# Patient Record
Sex: Female | Born: 1935
Health system: Southern US, Community
[De-identification: ages and names within clinical notes are randomized; demographics above are authoritative.]

## PROBLEM LIST (undated history)

## (undated) DIAGNOSIS — E785 Hyperlipidemia, unspecified: Secondary | ICD-10-CM

## (undated) DIAGNOSIS — F0280 Dementia in other diseases classified elsewhere without behavioral disturbance: Secondary | ICD-10-CM

## (undated) DIAGNOSIS — I2699 Other pulmonary embolism without acute cor pulmonale: Secondary | ICD-10-CM

## (undated) DIAGNOSIS — I10 Essential (primary) hypertension: Secondary | ICD-10-CM

## (undated) DIAGNOSIS — C801 Malignant (primary) neoplasm, unspecified: Secondary | ICD-10-CM

## (undated) DIAGNOSIS — G309 Alzheimer's disease, unspecified: Secondary | ICD-10-CM

## (undated) HISTORY — PX: REPLACEMENT TOTAL KNEE: SUR1224

## (undated) HISTORY — PX: ABDOMINAL HYSTERECTOMY: SHX81

## (undated) HISTORY — DX: Malignant (primary) neoplasm, unspecified: C80.1

## (undated) HISTORY — DX: Essential (primary) hypertension: I10

## (undated) HISTORY — DX: Hyperlipidemia, unspecified: E78.5

## (undated) HISTORY — DX: Dementia in other diseases classified elsewhere, unspecified severity, without behavioral disturbance, psychotic disturbance, mood disturbance, and anxiety: F02.80

## (undated) HISTORY — DX: Other pulmonary embolism without acute cor pulmonale: I26.99

## (undated) HISTORY — DX: Dementia in other diseases classified elsewhere, unspecified severity, without behavioral disturbance, psychotic disturbance, mood disturbance, and anxiety: G30.9

## (undated) HISTORY — PX: BUNIONECTOMY: SHX129

---

## 1998-07-13 ENCOUNTER — Other Ambulatory Visit: Admission: RE | Admit: 1998-07-13 | Discharge: 1998-07-13 | Payer: Self-pay | Admitting: Obstetrics and Gynecology

## 1998-08-09 ENCOUNTER — Ambulatory Visit (HOSPITAL_COMMUNITY): Admission: RE | Admit: 1998-08-09 | Discharge: 1998-08-09 | Payer: Self-pay | Admitting: Internal Medicine

## 1998-08-09 ENCOUNTER — Encounter: Payer: Self-pay | Admitting: Internal Medicine

## 1998-08-17 ENCOUNTER — Ambulatory Visit (HOSPITAL_COMMUNITY): Admission: RE | Admit: 1998-08-17 | Discharge: 1998-08-17 | Payer: Self-pay | Admitting: Obstetrics and Gynecology

## 1998-08-17 ENCOUNTER — Encounter: Payer: Self-pay | Admitting: Obstetrics and Gynecology

## 1999-08-02 ENCOUNTER — Other Ambulatory Visit: Admission: RE | Admit: 1999-08-02 | Discharge: 1999-08-02 | Payer: Self-pay | Admitting: Family Medicine

## 1999-08-14 ENCOUNTER — Encounter: Payer: Self-pay | Admitting: Family Medicine

## 1999-08-14 ENCOUNTER — Ambulatory Visit (HOSPITAL_COMMUNITY): Admission: RE | Admit: 1999-08-14 | Discharge: 1999-08-14 | Payer: Self-pay | Admitting: Family Medicine

## 1999-09-21 ENCOUNTER — Inpatient Hospital Stay (HOSPITAL_COMMUNITY): Admission: EM | Admit: 1999-09-21 | Discharge: 1999-09-28 | Payer: Self-pay | Admitting: Emergency Medicine

## 1999-09-21 ENCOUNTER — Encounter: Payer: Self-pay | Admitting: Family Medicine

## 2000-03-22 ENCOUNTER — Encounter: Payer: Self-pay | Admitting: Orthopedic Surgery

## 2000-03-26 ENCOUNTER — Ambulatory Visit (HOSPITAL_COMMUNITY): Admission: RE | Admit: 2000-03-26 | Discharge: 2000-03-26 | Payer: Self-pay | Admitting: Orthopedic Surgery

## 2000-08-15 ENCOUNTER — Encounter: Payer: Self-pay | Admitting: Family Medicine

## 2000-08-15 ENCOUNTER — Ambulatory Visit (HOSPITAL_COMMUNITY): Admission: RE | Admit: 2000-08-15 | Discharge: 2000-08-15 | Payer: Self-pay | Admitting: Family Medicine

## 2000-08-22 ENCOUNTER — Other Ambulatory Visit: Admission: RE | Admit: 2000-08-22 | Discharge: 2000-08-22 | Payer: Self-pay | Admitting: Family Medicine

## 2000-11-15 ENCOUNTER — Encounter: Admission: RE | Admit: 2000-11-15 | Discharge: 2000-12-19 | Payer: Self-pay | Admitting: Orthopedic Surgery

## 2001-05-14 ENCOUNTER — Encounter: Payer: Self-pay | Admitting: Neurosurgery

## 2001-05-14 ENCOUNTER — Ambulatory Visit (HOSPITAL_COMMUNITY): Admission: RE | Admit: 2001-05-14 | Discharge: 2001-05-14 | Payer: Self-pay | Admitting: Neurosurgery

## 2001-08-18 ENCOUNTER — Ambulatory Visit (HOSPITAL_COMMUNITY): Admission: RE | Admit: 2001-08-18 | Discharge: 2001-08-18 | Payer: Self-pay | Admitting: Family Medicine

## 2001-08-18 ENCOUNTER — Encounter: Admission: RE | Admit: 2001-08-18 | Discharge: 2001-08-18 | Payer: Self-pay | Admitting: Family Medicine

## 2001-08-18 ENCOUNTER — Encounter: Payer: Self-pay | Admitting: Family Medicine

## 2001-08-20 ENCOUNTER — Encounter: Payer: Self-pay | Admitting: Neurosurgery

## 2001-08-20 ENCOUNTER — Encounter: Admission: RE | Admit: 2001-08-20 | Discharge: 2001-08-20 | Payer: Self-pay | Admitting: Neurosurgery

## 2001-08-26 ENCOUNTER — Other Ambulatory Visit: Admission: RE | Admit: 2001-08-26 | Discharge: 2001-08-26 | Payer: Self-pay | Admitting: Family Medicine

## 2001-09-03 ENCOUNTER — Encounter: Admission: RE | Admit: 2001-09-03 | Discharge: 2001-09-03 | Payer: Self-pay | Admitting: Neurosurgery

## 2001-09-03 ENCOUNTER — Encounter: Payer: Self-pay | Admitting: Neurosurgery

## 2001-11-24 ENCOUNTER — Encounter: Payer: Self-pay | Admitting: Neurosurgery

## 2001-11-24 ENCOUNTER — Encounter: Admission: RE | Admit: 2001-11-24 | Discharge: 2001-11-24 | Payer: Self-pay | Admitting: Neurosurgery

## 2002-01-30 ENCOUNTER — Encounter: Payer: Self-pay | Admitting: Neurosurgery

## 2002-02-04 ENCOUNTER — Encounter: Payer: Self-pay | Admitting: Neurosurgery

## 2002-02-04 ENCOUNTER — Inpatient Hospital Stay (HOSPITAL_COMMUNITY): Admission: RE | Admit: 2002-02-04 | Discharge: 2002-02-07 | Payer: Self-pay | Admitting: Neurosurgery

## 2002-04-01 ENCOUNTER — Encounter: Payer: Self-pay | Admitting: Neurosurgery

## 2002-04-01 ENCOUNTER — Encounter: Admission: RE | Admit: 2002-04-01 | Discharge: 2002-04-01 | Payer: Self-pay | Admitting: Neurosurgery

## 2002-06-29 ENCOUNTER — Encounter: Admission: RE | Admit: 2002-06-29 | Discharge: 2002-06-29 | Payer: Self-pay | Admitting: Neurosurgery

## 2002-06-29 ENCOUNTER — Encounter: Payer: Self-pay | Admitting: Neurosurgery

## 2002-08-20 ENCOUNTER — Ambulatory Visit (HOSPITAL_COMMUNITY): Admission: RE | Admit: 2002-08-20 | Discharge: 2002-08-20 | Payer: Self-pay | Admitting: Family Medicine

## 2002-08-20 ENCOUNTER — Encounter: Payer: Self-pay | Admitting: Family Medicine

## 2002-08-25 ENCOUNTER — Other Ambulatory Visit: Admission: RE | Admit: 2002-08-25 | Discharge: 2002-08-25 | Payer: Self-pay | Admitting: Family Medicine

## 2002-08-31 ENCOUNTER — Encounter: Admission: RE | Admit: 2002-08-31 | Discharge: 2002-08-31 | Payer: Self-pay | Admitting: Orthopedic Surgery

## 2002-08-31 ENCOUNTER — Encounter: Payer: Self-pay | Admitting: Orthopedic Surgery

## 2002-09-17 DIAGNOSIS — I2699 Other pulmonary embolism without acute cor pulmonale: Secondary | ICD-10-CM

## 2002-09-17 HISTORY — DX: Other pulmonary embolism without acute cor pulmonale: I26.99

## 2003-01-12 ENCOUNTER — Encounter: Payer: Self-pay | Admitting: Orthopedic Surgery

## 2003-01-13 ENCOUNTER — Ambulatory Visit (HOSPITAL_COMMUNITY): Admission: RE | Admit: 2003-01-13 | Discharge: 2003-01-13 | Payer: Self-pay | Admitting: Gastroenterology

## 2003-01-18 ENCOUNTER — Inpatient Hospital Stay (HOSPITAL_COMMUNITY): Admission: RE | Admit: 2003-01-18 | Discharge: 2003-01-20 | Payer: Self-pay | Admitting: Orthopedic Surgery

## 2003-04-05 ENCOUNTER — Inpatient Hospital Stay (HOSPITAL_COMMUNITY): Admission: RE | Admit: 2003-04-05 | Discharge: 2003-04-08 | Payer: Self-pay | Admitting: Orthopedic Surgery

## 2003-08-25 ENCOUNTER — Ambulatory Visit (HOSPITAL_COMMUNITY): Admission: RE | Admit: 2003-08-25 | Discharge: 2003-08-25 | Payer: Self-pay | Admitting: Family Medicine

## 2003-08-31 ENCOUNTER — Other Ambulatory Visit: Admission: RE | Admit: 2003-08-31 | Discharge: 2003-08-31 | Payer: Self-pay | Admitting: Family Medicine

## 2004-02-18 ENCOUNTER — Encounter: Admission: RE | Admit: 2004-02-18 | Discharge: 2004-02-18 | Payer: Self-pay | Admitting: Family Medicine

## 2004-08-29 ENCOUNTER — Ambulatory Visit (HOSPITAL_COMMUNITY): Admission: RE | Admit: 2004-08-29 | Discharge: 2004-08-29 | Payer: Self-pay | Admitting: Family Medicine

## 2004-09-06 ENCOUNTER — Other Ambulatory Visit: Admission: RE | Admit: 2004-09-06 | Discharge: 2004-09-06 | Payer: Self-pay | Admitting: Family Medicine

## 2005-09-05 ENCOUNTER — Ambulatory Visit (HOSPITAL_COMMUNITY): Admission: RE | Admit: 2005-09-05 | Discharge: 2005-09-05 | Payer: Self-pay | Admitting: Family Medicine

## 2005-10-17 ENCOUNTER — Other Ambulatory Visit: Admission: RE | Admit: 2005-10-17 | Discharge: 2005-10-17 | Payer: Self-pay | Admitting: Family Medicine

## 2006-09-13 ENCOUNTER — Encounter: Admission: RE | Admit: 2006-09-13 | Discharge: 2006-09-13 | Payer: Self-pay | Admitting: Family Medicine

## 2006-10-08 ENCOUNTER — Ambulatory Visit (HOSPITAL_COMMUNITY): Admission: RE | Admit: 2006-10-08 | Discharge: 2006-10-08 | Payer: Self-pay | Admitting: Family Medicine

## 2006-10-17 ENCOUNTER — Encounter: Admission: RE | Admit: 2006-10-17 | Discharge: 2006-10-17 | Payer: Self-pay | Admitting: Family Medicine

## 2006-10-24 ENCOUNTER — Other Ambulatory Visit: Admission: RE | Admit: 2006-10-24 | Discharge: 2006-10-24 | Payer: Self-pay | Admitting: Family Medicine

## 2007-10-20 ENCOUNTER — Ambulatory Visit (HOSPITAL_COMMUNITY): Admission: RE | Admit: 2007-10-20 | Discharge: 2007-10-20 | Payer: Self-pay | Admitting: Family Medicine

## 2007-10-29 ENCOUNTER — Emergency Department (HOSPITAL_COMMUNITY): Admission: EM | Admit: 2007-10-29 | Discharge: 2007-10-29 | Payer: Self-pay | Admitting: Emergency Medicine

## 2007-11-12 ENCOUNTER — Other Ambulatory Visit: Admission: RE | Admit: 2007-11-12 | Discharge: 2007-11-12 | Payer: Self-pay | Admitting: Family Medicine

## 2008-07-15 ENCOUNTER — Encounter: Admission: RE | Admit: 2008-07-15 | Discharge: 2008-07-15 | Payer: Self-pay | Admitting: Family Medicine

## 2008-08-10 ENCOUNTER — Emergency Department (HOSPITAL_COMMUNITY): Admission: EM | Admit: 2008-08-10 | Discharge: 2008-08-10 | Payer: Self-pay | Admitting: Emergency Medicine

## 2008-10-26 ENCOUNTER — Ambulatory Visit (HOSPITAL_COMMUNITY): Admission: RE | Admit: 2008-10-26 | Discharge: 2008-10-26 | Payer: Self-pay | Admitting: Family Medicine

## 2009-01-04 ENCOUNTER — Other Ambulatory Visit: Admission: RE | Admit: 2009-01-04 | Discharge: 2009-01-04 | Payer: Self-pay | Admitting: Family Medicine

## 2009-02-09 IMAGING — CT CT CERVICAL SPINE W/O CM
3 of 4 series · 11 of 20 positions shown, 12 images · non-contrast
Comparison: No prior CT for comparison.  The patient had plain
films of the C-spine on 07/15/2008

CT HEAD

CLINICAL DATA: Fell - headache and neck pain

CT HEAD WITHOUT CONTRAST
CT CERVICAL SPINE WITHOUT CONTRAST
TECHNIQUE: Multidetector CT imaging of the head and cervical spine
was performed following the standard protocol without intravenous
contrast.  Multiplanar CT image reconstructions of the cervical
spine were also generated.

[Series 4: c_spine 2.0 b31s · axial · 0.33mm/px · z∈[-256,-166]mm · 4 of 76 slices shown]
[im 16/76  bone]
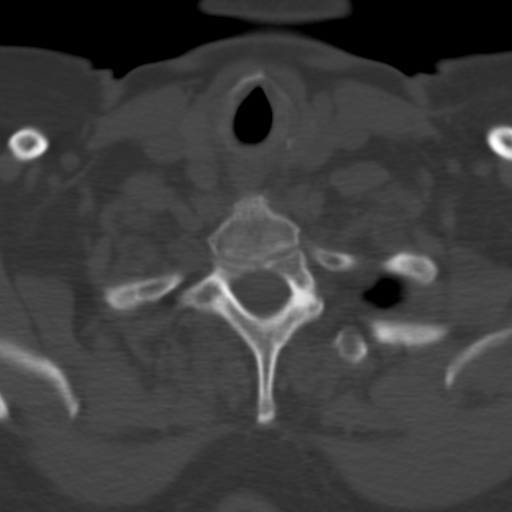
[im 31/76  bone]
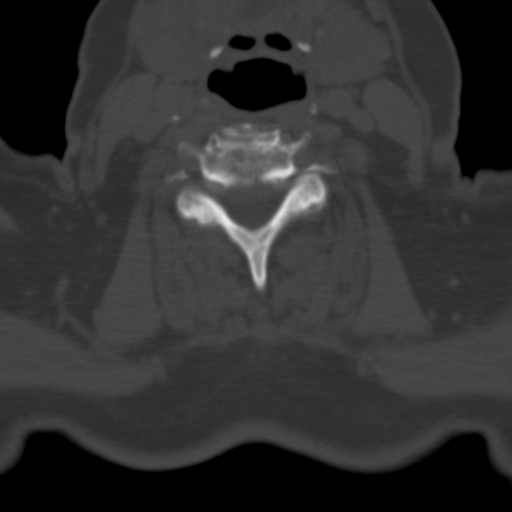
[im 46/76  bone]
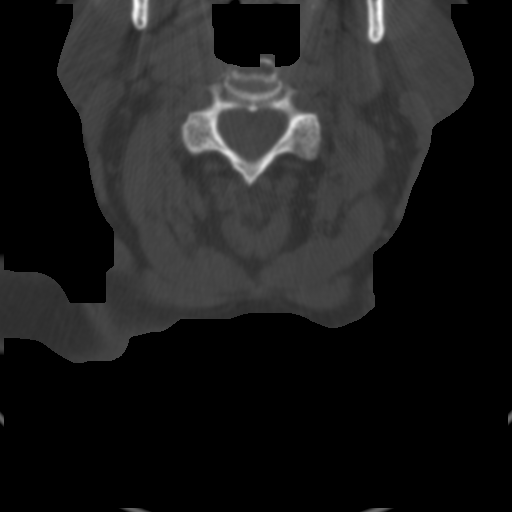
[im 61/76  bone]
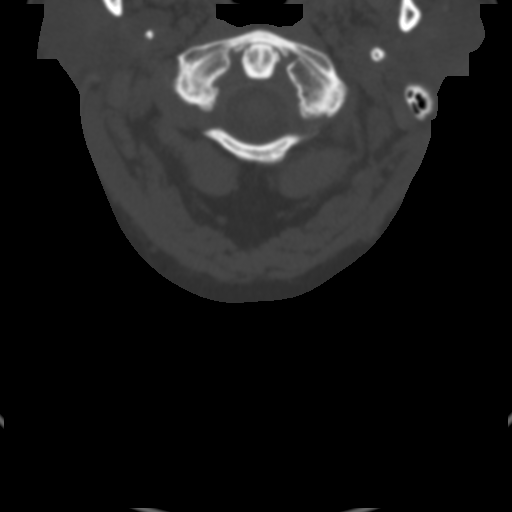

[Series 602: <mpr thick range> · axial · 0.33mm/px · z∈[-285,-201]mm · 4 of 78 slices shown, 5 images]
[im 16/78  soft-tissue]
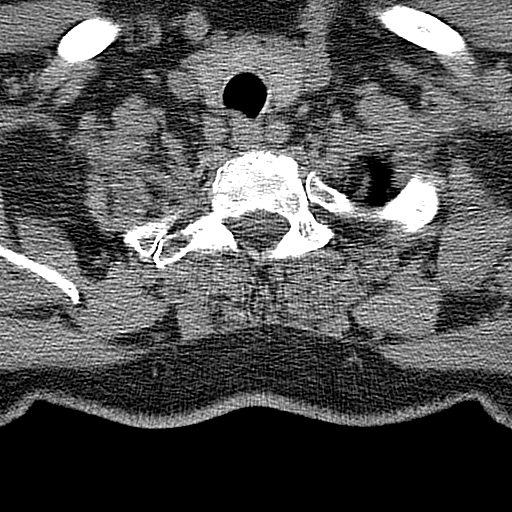
[im 16/78  bone]
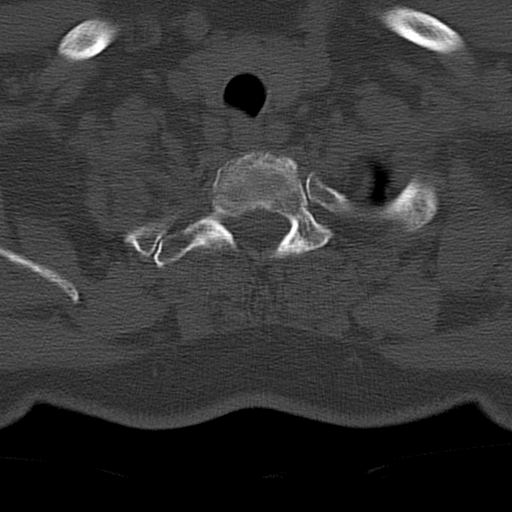
[im 31/78  bone]
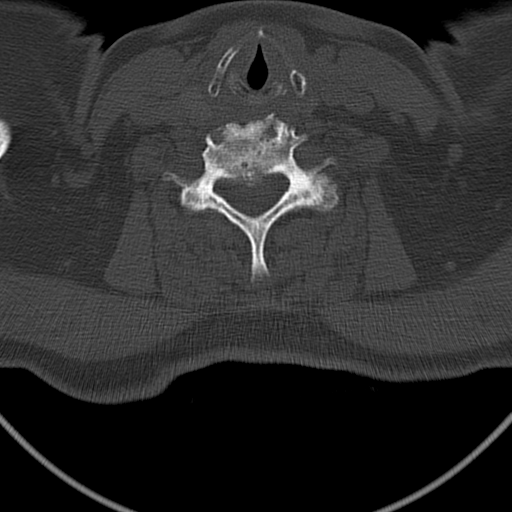
[im 47/78  bone]
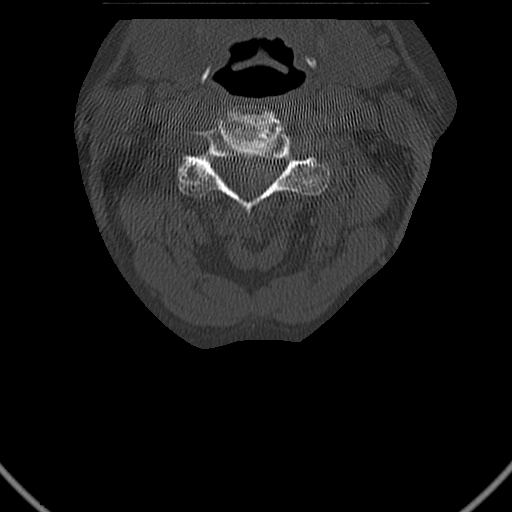
[im 62/78  bone]
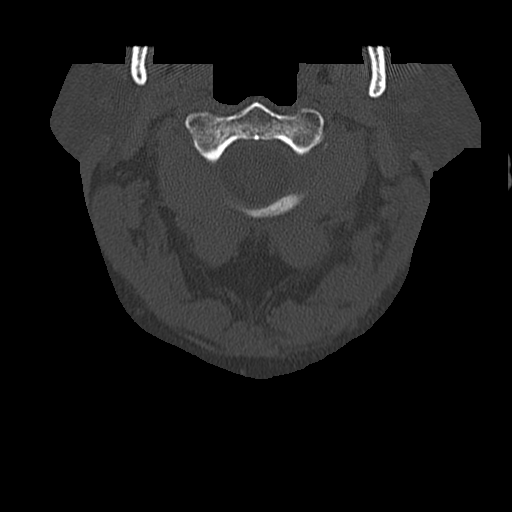

[Series 603: <mpr thick range(1)> · coronal · 0.33mm/px · 3 of 39 slices shown]
[im 8/39  bone]
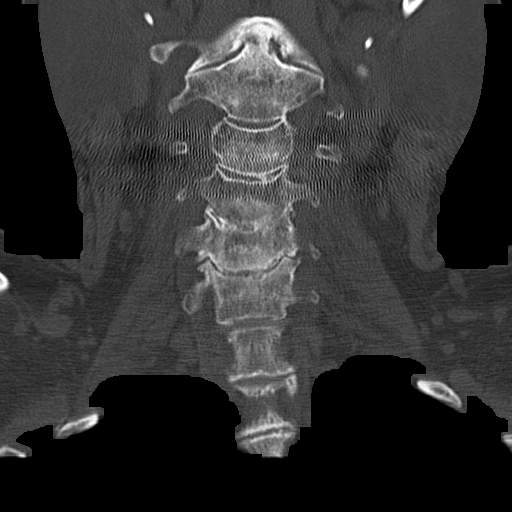
[im 16/39  bone]
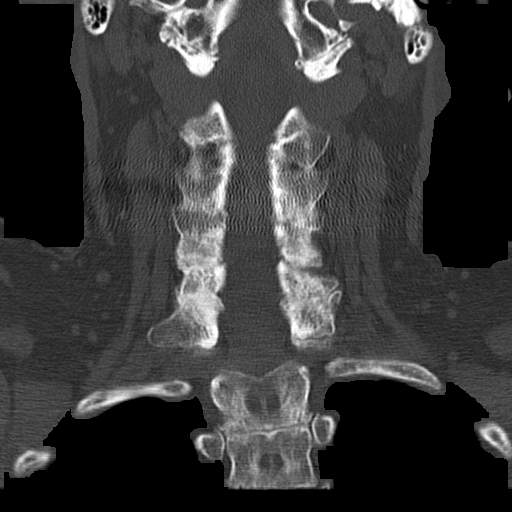
[im 23/39  bone]
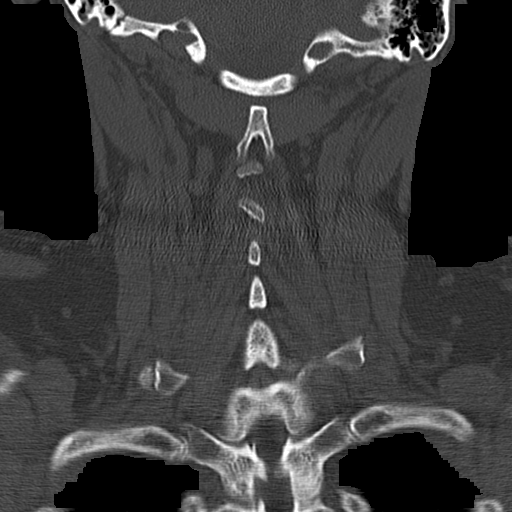

[11 of 20 positions shown; findings below may reference images not displayed]

FINDINGS: .  Ventricular size and CSF spaces within normal limits
for age.  There is bilateral basal ganglia calcification.  No acute
infarct, bleed, or mass effect.  Calvarium intact.  No fluid in the
sinuses visualized.
IMPRESSION: No acute or significant findings

CT CERVICAL SPINE
FINDINGS: There is advanced multilevel degenerative disc disease,
especially at C5-6.  No fractures or subluxations.  No critical
spinal stenosis.  Prevertebral soft tissues unremarkable.  No
obvious acute disc herniation or intraspinal hematoma.
IMPRESSION: Advanced, multilevel degenerative disc disease - no acute findings.

## 2009-10-31 ENCOUNTER — Ambulatory Visit (HOSPITAL_COMMUNITY): Admission: RE | Admit: 2009-10-31 | Discharge: 2009-10-31 | Payer: Self-pay | Admitting: Family Medicine

## 2010-01-18 ENCOUNTER — Other Ambulatory Visit: Admission: RE | Admit: 2010-01-18 | Discharge: 2010-01-18 | Payer: Self-pay | Admitting: Family Medicine

## 2010-01-25 ENCOUNTER — Encounter: Admission: RE | Admit: 2010-01-25 | Discharge: 2010-01-25 | Payer: Self-pay | Admitting: Neurosurgery

## 2010-10-08 ENCOUNTER — Encounter: Payer: Self-pay | Admitting: Family Medicine

## 2011-02-02 NOTE — Op Note (Signed)
NAME:  Amy Andrade, ROBECK                        ACCOUNT NO.:  [id number]   MEDICAL RECORD NO.:  [id number]                   PATIENT TYPE:  INP   LOCATION:  5014                                 FACILITY:  [facility]   PHYSICIAN:  Mila Homer. Sherlean Foot, M.D.              DATE OF BIRTH:  Oct 01, 1935   DATE OF PROCEDURE:  04/05/2003  DATE OF DISCHARGE:                                 OPERATIVE REPORT   PREOPERATIVE DIAGNOSIS:  Right knee arthritis.   POSTOPERATIVE DIAGNOSIS:  Right knee arthritis.   OPERATION PERFORMED:  Left total knee arthroplasty.   SURGEON:  Mila Homer. Sherlean Foot, M.D.   ASSISTANT:  Legrand Pitts. Duffy, P.A.   ANESTHESIA:  General.   INDICATIONS FOR PROCEDURE:  The patient is an elderly black female now three  months status post left total knee arthroplasty with a good result and ready  for right total knee arthroplasty, after failure of conservative measures.  Informed consent was obtained.   DESCRIPTION OF PROCEDURE:  The patient was laid supine and administered  general anesthesia.  The right leg was prepped and draped in the usual  sterile fashion.  It was then exsanguinated with an Esmarch and tourniquet  elevated to 350 mmHg.  A #10 blade was used to make a straight midline  incision.  A fresh blade was used to make a median  parapatellar arthrotomy.  I then removed the synovium with a #10 blade.  I then everted the patella  and measured it at 22 mm thickness and reamed down to 13 mm with a 35 mm  reamer and used a template to drill three lug holes and with the trial in  place, it also measured 22 mm.  I then used the extramedullary guide to make  a cut perpendicular to the anatomic axis of the tibia removing 2 mm of bone  off the lateral compartment.  I then made an intramedullary drill hole in  the femur and then set the guide on 4 degree valgus cut, pinned down the  distal femoral cutting block and made the cut with a sagittal saw.  I then  removed the guide, took the  intramedullary guide out, through the  epicondylar axis, the posterior condylar angle measured 3 degrees.  I used a  sizer, pinned to the 3 degree holes.  It measured a size D.  I then made  anterior, posterior and chamfer cuts with a 4 in 1 cutter.  I then put the  lamina spreader in place and removed both medial and lateral menisci,  posterior medial and lateral condylar osteophytes and the ACL and PCL.  I  then easily could place a 10 mm spacer block in place.  I had good flexion  and extension gap balance.  At this point I irrigated copiously and then  cemented in a size 4 tibia first, size D femur second, 10 mm polyp insert, a  35  mm patella, all cemented components.  I then waited for the cement to  harden, let the tourniquet down and cauterized bleeding vessels.  I then  closed the arthrotomy with interrupted #1 Vicryl.  After  leaving the Hemovac deep to the arthrotomy, I closed the deep soft tissues  with interrupted 0 Vicryl and then 0 subcuticular Vicryl and Steri-Strips.  The patient tolerated the procedure well.  I dressed it with Adaptic, 4 x  4s, sterile Webril and an Ace wrap.                                                Mila Homer. Sherlean Foot, M.D.    SDL/MEDQ  D:  04/05/2003  T:  04/05/2003  Job:  [id number]

## 2011-02-02 NOTE — H&P (Signed)
[place]  Patient:    Amy Andrade, Amy Andrade                     MRN: [id number] Adm. Date:  [id number] Attending:  Wende Mott                         History and Physical  CHIEF COMPLAINT:  Painful left shoulder.  HISTORY OF PRESENT ILLNESS:  This is a 75 year old female who had been followed in the office over the past year for impingement syndrome and subacromial bursitis of the left shoulder.  Patient has undergone therapeutic injection, shoulder exercises, warm compresses, and anti-inflammatories, with some progressive worsening over the last few months with increased stiffness and weakness in the left arm.  Patient has left shoulder pain at night when resting on left shoulder, which comes down to the side of the arm.  PAST MEDICAL HISTORY:  Heart disease, high blood pressure, history of pulmonary emboli, hysterectomy, bunionectomy, urethral dilatation.  ALLERGIES:  None known.  MEDICATIONS: 1. Accupril 20 mg daily at bedtime. 2. Premarin 0.625 mg daily. 3. Celebrex 200 mg b.i.d. 4. Warfarin 3 mg daily and one-and-a-half tablets every third day. 5. NitroQuick 0.4 mg p.r.n.  HABITS:  Occasional use of alcohol.  FAMILY HISTORY:  Noncontributory.  REVIEW OF SYSTEMS:  Basically that of history of present illness.  No cardiac or respiratory.  No urinary or bowel symptoms.  PHYSICAL EXAMINATION:  VITAL SIGNS:  Temperature 97.3, pulse 64, respirations 16, blood pressure 142/86.  Height 5 feet 10 inches, weight 190.  HEENT:  Normocephalic.  Eyes show conjunctivae and sclerae clear.  NECK:  Supple.  CHEST:  Clear.  CARDIAC:  S1, S2, regular.  EXTREMITIES:  Left shoulder tender, subacromial crepitus with limited rotation, external rotation, and abduction, with pain on resisted abduction and resisted external rotation.  Good grip and pinch.  Intrinsics intact.  LABORATORY DATA:  Patient has had MRI done which demonstrates  impingement of the rotator cuff but not tear of the rotator cuff.  IMPRESSION:  Impingement syndrome, left shoulder; subacromial bursitis, left shoulder. DD:  03/26/00 TD:  03/26/00 Job: [id number]

## 2011-02-02 NOTE — Discharge Summary (Signed)
NAME:  Amy Andrade, Amy Andrade                        ACCOUNT NO.:  [id number]   MEDICAL RECORD NO.:  [id number]                   PATIENT TYPE:  INP   LOCATION:  5014                                 FACILITY:  [facility]   PHYSICIAN:  Mila Homer. Sherlean Foot, M.D.              DATE OF BIRTH:  1935-10-23   DATE OF ADMISSION:  04/05/2003  DATE OF DISCHARGE:  04/08/2003                                 DISCHARGE SUMMARY   ADMISSION DIAGNOSES:  1. Osteoarthritis right knee.  2. Osteoarthritis left knee, status post left knee replacement, May 2004.  3. Hypertension.  4. Hypercholesterolemia.  5. Seasonal allergies.  6. History of deep vein thrombosis with pulmonary emboli, January 2000.  7. History of constipation.   DISCHARGE DIAGNOSES:  1. Osteoarthritis right knee status post right total knee arthroplasty.  2. Acute blood loss anemia secondary to surgery.  3. Hyponatremia, now resolved.  4. Constipation.  5. Osteoarthritis left knee, status post left total knee arthroplasty.  6. Hypertension.  7. Hypercholesterolemia.  8. Seasonal allergies.  9. History of deep vein thrombosis with subsequent pulmonary emboli, January     2000.  10.      History of constipation.   SURGICAL PROCEDURE:  On April 05, 2003 [name] underwent a right total knee  arthroplasty by Dr. Mila Homer. Lucey, assisted by Arnoldo Morale P.A.C.  She  had a Legacy knee femoral component size B, right placed with a NexGen  complete knee solution fluted stem tibial component size 4.  A NexGen all  poly patella standard size 35-mm, diameter 9-mm thickness with a Legacy knee  posterior stabilized LPS articular surface size yellow CD with a 10-mm  height and then a patellar reaming system with a reamer blade 32-mm  diameter.   COMPLICATIONS:  None.   CONSULTATIONS:  1. Physical therapy, occupational therapy consult, April 06, 2003.  2. Case management consult, April 07, 2003.   HISTORY OF PRESENT ILLNESS:  This 75 year old female  patient presented to  Dr. Sherlean Foot with a history of five years of gradual onset right knee pain.  She did well status post a left knee replacement in May 2004 and was just to  have her right knee fixed.  The right knee pain is intermittent and present  when she first arises from a chair or steps.  The pain is sharp, clicking  pain and the knee gives way at times.  She has failed conservative treatment  and because of that she is presenting for a right knee replacement.   HOSPITAL COURSE:  #1.  [name] tolerated her surgical procedure well  without immediate postoperative complications.  She was transferred to  5,000.  Post-op day one, T-max 100.2, vitals were stable.  The pain was  controlled with meds.  Hemoglobin was 10.8, hematocrit 31.5.  Sodium 128.  IV fluids were decreased.  Labs were monitored and she was started on  therapy.  #  2.  On post-op day two, she was doing well.  T-max 99.2, vitals stable.  The right knee incision looked good.  Hemoglobin 9.5, hematocrit 27.4 and  sodium had improved to 134.  She was switched to p.o. pain meds and  continued on therapy.  #3.  On April 08, 2003, she was doing well.  Her pain was well controlled.  She was ambulating well with therapy and it was felt she was ready for  discharge and she was DC'd home.   DIET:  She can continue her pre-hospitalization diet.   DISCHARGE MEDICATIONS:  1. She may resume her pre-hospitalization meds except for the aspirin.     These include:  2. Zetia 10 mg p.o. q.a.m.  3. Verelan PM p.o. q.a.m.  4. Lotensin/hydrochlorothiazide 25/12.5 mg p.o. q.a.m.  5. Celebrex 200 mg p.o. b.i.d.  6. Additional medications include:  Lovenox 40 mg subcu every day for 11     days.  7. Percocet 5/325 mg 1-2 p.o. q.4h. p.r.n. for pain.   ACTIVITY:  She can be out of bed, weightbearing as tolerated on the right  leg with the use of a walker.  All other activities she should refer to the  blue total knee discharge sheet which  she was given on discharge.   WOUND CARE:  Please see the blue discharge sheet for these directions.  She  may shower after no drainage from the wound for two days.  She is also  arranged for home physical therapy per [facility].   FOLLOW UP:  She needs an appointment with Dr. Sherlean Foot in approximately 10-12  days.  She needs to call [phone] for that appointment.   LABORATORY DATA:  Chest x-ray, Jan 30, 2002, showed normal chest.   On July 230, 2004, hemoglobin was 10.8 and hematocrit 31.5; on the 21st  hemoglobin was 9.5 and hematocrit 27.4; on April 08, 2003 hemoglobin was 8.6  and hematocrit 25.4.  On April 02, 2003, sodium was 134; On April 06, 2003 the  sodium was 128 and on the 21st it was 134.  April 06, 2003 chloride was 94,  glucose 147, calcium 8.2.  On April 07, 2003 chloride was 100, glucose 137.  All other laboratory studies were within normal limits.      Legrand Pitts Duffy, P.A.                      Mila Homer. Sherlean Foot, M.D.    KED/MEDQ  D:  04/20/2003  T:  04/20/2003  Job:  [id number]

## 2011-02-02 NOTE — H&P (Signed)
NAME:  Amy Andrade, Amy Andrade                        ACCOUNT NO.:  [id number]   MEDICAL RECORD NO.:  [id number]                   PATIENT TYPE:  INP   LOCATION:                                       FACILITY:  [facility]   PHYSICIAN:  Mila Homer. Sherlean Foot, M.D.              DATE OF BIRTH:  1935-12-10   DATE OF ADMISSION:  01/18/2003  DATE OF DISCHARGE:                                HISTORY & PHYSICAL   CHIEF COMPLAINT:  Bilateral knee pain, left worse than right.   HISTORY OF PRESENT ILLNESS:  This 75 year old black female patient presented  to Dr. Mila Homer. Lucey with a five-year history of gradual-onset but  progressive bilateral knee pain.  She has had no known injury or prior  surgery to her knees.  The pain in both knees is pretty much constant with  any weightbearing.  Pain ranges from just an aching sensation to a throbbing  or very sharp sensation.  In the left knee, the pain seems to be diffuse  about the joint and she is able to put very little weight on that knee.  Right knee pain seems to be centered mostly along the lateral aspect of the  joint.  The pain increases when she is standing or if she tries to go up or  down stairs.  The pain does decrease with rest.  Her left knee seems to  grind at times and both seem weak.  They both also swell and occasionally  keep her up at night.  There is no popping, locking, catching or giving way.  She is currently taking just Ultracet and Aleve as needed for pain and has  been walking with the use of a cane since December of 2003.  She has not had  cortisone injections or Synvisc/Hyalgan injections in the past.   ALLERGIES:  No known drug allergies.   CURRENT MEDICATIONS:  1. Zetia 10 mg one tablet p.o. q.a.m.  2. Verelan PM 300 mg -- a tablet q.a.m.  3. Lotensin HCT 20/12.5 mg one tablet p.o. q.a.m.  4. Celebrex 200 mg one tablet p.o. b.i.d.  5. Aspirin 81 mg one tablet p.o. q.a.m., with the last dose to be on the     28th of April 2004.  6. Zyrtec 10 mg one tablet p.o. q.a.m.   PAST MEDICAL HISTORY:  1. Hypertension for seven to eight years.  2. Hypercholesterolemia for several years.  3. She developed a DVT with a subsequent pulmonary emboli in January of     2000.  She was subsequently treated with Coumadin for a year.  She was     evaluated as to why she developed a clot and she said her blood clots     easily, but she had no name for the disorder.  She had not had surgery or     any hormone treatments prior to the clot.   She  denies any history of diabetes mellitus, thyroid disease, hiatal hernia,  peptic ulcer disease, reflux, heart disease, asthma or any other chronic  medical condition other than noted previously.   PAST SURGICAL HISTORY:  1. Dilation of urethra, mid 6s, in [place], [place].  2. Hysterectomy in 1984 in [place], [place].  3. Left rotator cuff repair, 2001, by Dr. Myrtie Neither.  4. Fusion, L3-4, 2003, by Dr. Donalee Citrin.  5. Right bunionectomy, 1998, by Dr. Alva Garnet in [place], [place].   She denies any complications from the above-mentioned procedures.   SOCIAL HISTORY:  She quit smoking 40 years ago and prior to that, only  smoked 2 to 3 cigarettes a day for about 2 to 3 years.  She does drink about  three glasses of wine a day.  She does not use any drugs.  She is married  and has two children.  She and her husband live in a two-story house with  two steps into the main entrance.  Her bedroom is on the second floor.  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  Her medical doctor is Dr. Renaye Rakers at [phone] and she is aware of her  upcoming surgery.   FAMILY HISTORY:  Her mother died at the age of 53 with dementia and chronic  bronchitis.  Her father died at the age of 48 with heart disease and  diabetes.  She has one brother alive at age 86 with hypertension and one  sister who was slightly retarded, who died at age 47 of unknown causes.  Her  daughter  is 28 and her son is 97 and they are both alive and well.   REVIEW OF SYSTEMS:  She does have new seasonal allergies that seem to affect  her both in the spring and fall.  She does have occasional dyspnea on  exertion with going up or down stairs.  She has constipation on and off  which is treated with dietary changes.  She does complain of some pain in  her right elbow and possible osteoarthritis therapy.  She does wear glasses.  She has a living will and her power of attorney is [employer].  All other  systems are negative and noncontributory.   PHYSICAL EXAMINATION:  GENERAL:  Well-developed, well-nourished, mildly  overweight black female in no acute distress.  Walks with an antalgic gait  and the use of a cane.  When she moves from a sitting to a standing  position, it is a few seconds before she can completely stand upright and  bear weight on both legs.  Mood and affect are appropriate.  Talks easily  with the examiner.  Height 5 feet 11 inches, weight 190 pounds; BMI is 25.5.  VITAL SIGNS:  Temperature 96.8 degrees Fahrenheit, pulse 72, respirations 16  and BP 150/72.  HEENT:  Normocephalic, atraumatic, without frontal or maxillary sinus  tenderness to palpation.  Conjunctivae pink, sclerae anicteric.  PERRLA.  EOMs intact.  No visible external ear deformities.  Hearing grossly intact.  Tympanic membranes pearly-gray bilaterally with good light reflex.  Nose and  nasal septum midline.  Nasal mucosa pink and moist without exudates or  polyps noted.  Buccal mucosa pink and moist.  Good dentition.  Pharynx  without erythema or exudates.  Tongue and uvula midline.  Tongue without  fasciculations and uvula rises equally with phonation.  NECK:  No visible masses or lesions noted.  Trachea midline.  No palpable  lymphadenopathy  nor thyromegaly.  Carotids +2 bilaterally without bruits. She has pretty much full range of motion of the cervical spine but slightly  decreased lateral  bending, which seems to be equal to both sides.  There was  also an audible crack when she did that.  No real pain with palpation in  that area, however.  CARDIOVASCULAR:  Heart rate and rhythm regular.  S1 and S2 present without  rubs, clicks or murmurs noted.  RESPIRATORY:  Respirations even and unlabored.  Breath sounds clear to  auscultation bilaterally without rales or wheezes noted.  ABDOMEN:  Rounded abdominal contour.  Bowel sounds present x4 quadrants.  Soft, nontender to palpation, without hepatosplenomegaly nor CVA tenderness.  She has a well-healed midline abdominal incision.  Femoral pulses are +2  bilaterally.  BACK:  Nontender to palpation along the entire length of the vertebral  column.  BREASTS/GU/RECTAL/PELVIC:  These exams deferred at this time.  MUSCULOSKELETAL:  No obvious deformities, bilateral upper extremities, with  full range of motion of these extremities without pain.  Radial pulses are  +2 bilaterally.  She has full range of motion of her hips, ankles and toes  bilaterally.  DP and PT pulses are +2.  No lower extremity edema.  Left knee  has full extension and flexion to 115 degrees with a large amount of  crepitus with range of motion.  At rest, it appears to be in about 15  degrees of valgus.  There is a minimal effusion in the knee.  Minimal pain  with palpation on both the medial and lateral joint lines.  Negative  patellar apprehension and grind test.  Slight opening when I apply a varus  stress to the knee.  Negative anterior drawer.  Right knee is also in about  15 degrees of valgus positioning.  It lacks about 5 degrees of full  extension and can flex to 100 degrees, but there is no crepitus with range  of motion of the knee.  Again, +1 effusion.  Negative patellar apprehension  and grind test.  She also opens a little bit with a valgus stress to the  knee.  Negative anterior drawer.  She has negative Homans sign and no calf  pain with palpation  bilaterally.  NEUROLOGIC:  Alert and oriented x3.  Cranial nerves II-XII are grossly  intact.  Strength 5/5, bilateral upper and lower extremities.  Rapid  alternating movements intact.  Deep tendon reflexes 2+, bilateral upper and  lower extremities.  Sensation intact to light touch.   RADIOLOGIC FINDINGS:  X-rays taken of both knees in March 2004 showed the  left knee with a severe valgus deformity and arthritis and the right knee  with a moderately severe arthritis.   IMPRESSION:  1. Osteoarthritis, bilateral knees, left worse than right.  2. Hypertension.  3. Hypercholesterolemia.  4. History of deep venous thrombosis with subsequent pulmonary emboli,     January of 2000.  5. Seasonal allergies.  6. History of constipation.   PLAN:  The patient will be admitted to [facility] on Jan 18, 2003  where she will undergo a left total knee arthroplasty by Dr. Mila Homer. Lucey.  She will undergo all the routine preoperative laboratory tests and  studies prior to this procedure.  She will stop her aspirin five days before  surgery and we will cover her with either heparin or Lovenox until Coumadin  is therapeutic.     Legrand Pitts Duffy, P.A.  Mila Homer. Sherlean Foot, M.D.    KED/MEDQ  D:  01/07/2003  T:  01/08/2003  Job:  [id number]

## 2011-02-02 NOTE — Discharge Summary (Signed)
NAME:  Amy Andrade, Amy Andrade                        ACCOUNT NO.:  [id number]   MEDICAL RECORD NO.:  [id number]                   PATIENT TYPE:  INP   LOCATION:  5025                                 FACILITY:  [facility]   PHYSICIAN:  Mila Homer. Sherlean Foot, M.D.              DATE OF BIRTH:  1936/04/02   DATE OF ADMISSION:  01/18/2003  DATE OF DISCHARGE:  01/20/2003                                 DISCHARGE SUMMARY   ADMISSION DIAGNOSES:  1. Osteoarthritis bilateral knees, left worse than right.  2. Hypertension.  3. Hypercholesterolemia.  4. History of deep vein thrombosis with subsequent pulmonary emboli January     2000.  5. Seasonal allergies.  6. History of constipation.   DISCHARGE DIAGNOSES:  1. Status post left below-knee amputation.  2. Osteoarthritis of right knee.  3. Hypertension.  4. Hypercholesterolemia.  5. History of deep vein thrombosis with subsequent pulmonary emboli January     2000.  6. Seasonal allergies.  7. History of constipation.   HISTORY OF PRESENT ILLNESS:  Amy Andrade is a 75 year old female patient who  has a five-year history of gradual onset of progressive bilateral knee pain.  She has no known injury or prior surgeries to her knees.  She has pain  pretty much constantly with any weightbearing.  The pain ranges from just  aching sensation to a throbbing sensation.  Her left knee has diffuse pain  about the joint, and she is unable to put very much weight on that knee due  to pain.  The right knee pain seems to be mostly centered along the lateral  aspect of the joint.  Her pain increases whenever she rises to stand or when  she goes up or down stairs.  Pain is not relieved totally with rest.  The  left knee seems to grind at times, and both knees are weak.  She also notes  some swelling and occasionally her knees keep her from sleeping at night.  She denies popping, locking, or catching or giving way sensation.  Her  current medications include Ultracet and  Aleve.  She uses a cane to ambulate  and has been doing so since December 2003.  She has not had any cortisone  injections or Cnidus/Hyaline injections in the past.   ALLERGIES:  No known drug allergies.   CURRENT MEDICATIONS:  1. Zetia 10 mg 1 tablet p.o. q.a.m.  2. Verelan PM 300 mg 1 tablet q.a.m.  3. Lotensin and HCTZ 20/12.5 mg 1 tablet p.o. q.a.m.  4. Celebrex 200 mg 1 tablet p.o. b.i.d.  5. Aspirin 81 mg 1 tablet p.o. q.a.m.  6. Zyrtec 10 mg 1 tablet p.o. q.a.m.   SURGICAL PROCEDURE:  The patient was taken to the OR on Jan 18, 2003, by Dr.  Georgena Spurling, assisted by Arnoldo Morale, P.A.-C.  The patient was given a  general anesthetic and underwent a left total knee arthroplasty.  The  patient tolerated the procedure well and returned to the postoperative care  unit in good and stable condition.   CONSULTATIONS:  PT and OT.   HOSPITAL COURSE:  The patient developed postoperative anemia secondary to  surgery; however, she remained asymptomatic and was discharged with  hemoglobin 9.9, hematocrit 29.4.  She also developed hyponatremia and low  chloride.  On postop day #1, placed on fluid restriction, and her potassium  and chloride levels increased.  The patient did well with physical therapy  and was discharged on postoperative day #2.   LABORATORY DATA:  Preoperative labs are not available at this time.  CBC on  postoperative day #2 showed white count 7.0, hemoglobin 9.9, hematocrit  29.4, platelet count 172.  Electrolytes on postop day #2 showed sodium 131,  potassium 4.1, chloride 95, bicarb 28, BUN 7, creatinine 0.6, and glucose  132.  PT and INR on postop day #2 were 19.2 and 1.8, respectively.   DISCHARGE MEDICATIONS:  1. The patient is to resume home medications except for aspirin.  She may     resume her aspirin once finished with the Coumadin dosing.  2. Percocet 5 mg 1 to 2 tablets q.4-6h. p.r.n. pain.  3. Coumadin 5 mg as dosed by pharmacy with starting dose of 2.5  mg 1 daily     at 6 p.m.  4. Lovenox 40 mg 1 injection the night of discharge at 10 p.m.   ACTIVITY:  As tolerated with walker.  [facility] and to monitor  Coumadin.   DIET:  No restrictions.   CONDITION ON DISCHARGE:  Good and stable condition on discharge from  hospital to home.   FOLLOW UP:  The patient is to call Dr. Tobin Chad office for appointment in  approximately 10 days for staple removal.  Followup with primary care  physician, Dr. Parke Simmers, in two to three weeks to check her hemoglobin,  hematocrit, sodium, and chloride levels as needed.   WOUND CARE:  The wound is to be kept clean and dry.  The patient may shower  after two days of no drainage from wound.  The patient is to call Dr.  Tobin Chad office if she has temperature greater than 100.5, chills, foul-  smelling drainage, swelling, or pain not controlled with pain medications.   SPECIAL INSTRUCTIONS:  CPM at 90 degrees 6 to 8 hours per day, increased by  10 degrees per day.     Richardean Canal, P.A.                       Mila Homer. Sherlean Foot, M.D.    GC/MEDQ  D:  03/16/2003  T:  03/16/2003  Job:  [id number]

## 2011-02-02 NOTE — Op Note (Signed)
NAME:  Amy Andrade, Amy Andrade                        ACCOUNT NO.:  [id number]   MEDICAL RECORD NO.:  [id number]                   PATIENT TYPE:  INP   LOCATION:  NA                                   FACILITY:  [facility]   PHYSICIAN:  Mila Homer. Sherlean Foot, M.D.              DATE OF BIRTH:  1936-09-06   DATE OF PROCEDURE:  01/18/2003  DATE OF DISCHARGE:                                 OPERATIVE REPORT   PREOPERATIVE DIAGNOSIS:  Left knee osteoarthritis.   POSTOPERATIVE DIAGNOSIS:  Left knee osteoarthritis.   PROCEDURE:  Left total knee arthroplasty.   INDICATIONS FOR PROCEDURE:  The patient is an elderly black female who  followed conservative measures for osteoarthritis of the knee.  Informed  consent was obtained, as was medical clearance.   DESCRIPTION OF PROCEDURE:  The patient was taken to the operating room and  the left lower extremity was prepped and draped in the usual sterile  fashion.  She was placed in the supine position, after general anesthesia.  A standard midline incision was made with the #10 blade, approximately 10-12  cm in length.  A freshening blade was used to make a median parapatellar  arthrotomy and the kneecap was everted.  We measured the patella to 22 mm,  and used the patellar reamer to ream down to 13 mm.  I removed the excess  bone and used the 35 mm template to drill three lug holes.  With the  patellar trial in place, it measured 22 mm in thickness.  I removed the  patellar trial.  I then performed a synovectomy at the patellofemoral pouch.  I also then dissected the deep MCL off the medial crest of the tibia, around  to, but not through the semimembranosus tendon.  I could then easily flex  the knee and keep the patella everted.  I then cut the ACL and PCL to the  deliver the tibia anteriorly.  I then used the extramedullary alignment  guide to make a cut perpendicular to the anatomic axis of the tibia;  removing 2 mm of bone off the lateral tibial _______.   Once this cut was  made, I then turned my attention to the femur -- where I made an  intermedullary drill hole.  I then placed an intermedullary guide on four  degree valgus cut, tamped it down to the distal aspect of the femur, and  then pinned my distal femoral cutting block in place.  I then removed the  intermedullary guide and made the distal femoral cut with a saggital saw.  I  then marked out the epicondylar axis.  I measured the posterior condylar  angle to be seven degrees.  I therefore sized to a size B and pinned to the  seven degree holes.  I then placed the D 4-in-1 cutter in place and made the  anterior, posterior and chamfer cuts.  I then placed the  alignment spreader  in the lateral compartment, and removed the medial meniscus, ACL, PCL and  posteromedial condylar osteophytes.  I then removed the alignment spreader  to the medial compartment, and removed the lateral meniscus and  posterolateral condylar osteophytes.  I then placed a 12 mm spacer block in  place, and had good overall alignment, but was tighter in the lateral  compartment.  I then went into extension and tensioned the lateral  compartment with the laminar spreader, and used a pie crusting technique to  make the extension and flexion gap balance medially and laterally.  I then  finished the femur with the  size D finishing block; finished the tibia with the size 4 tibial tray; and  then, trialed with size 4 tibia, size 35 patella, size D femur, and 12 mm  poly insert.  I had excellent flexion and extension gap balance; excellent  patellar tracking.  Drop and dangle was back to at least 135 degrees.  I  then removed the components, copiously irrigated, and then cemented in the  tibia first femur, second patella, third stem, and then real polyethylene;  then allowed the cement to harden in extension.  I removed all excess cement  before the cement was hardened.  I then let the tourniquet down at 59  minutes, and  cauterized all bleeding vessels.  I then left a Hemovac deep  into the arthrotomy, coming out superolaterally.  I then closed the  arthrotomy with interrupted #1 Vicryl, deep soft tissues with interrupted 0  Vicryl, the subcuticular with 2-0 Vicryl running stitch, and skin staples.  The patient tolerated the procedure well.   COMPLICATIONS:  None.   DRAINS:  One Hemovac.   ESTIMATED BLOOD LOSS:  300 cc.                                               Mila Homer. Sherlean Foot, M.D.    SDL/MEDQ  D:  01/18/2003  T:  01/18/2003  Job:  [id number]

## 2011-02-02 NOTE — H&P (Signed)
NAME:  Defenbaugh, Nieve J.                       ACCOUNT NO.:  [id number]   MEDICAL RECORD NO.:  [id number]                   PATIENT TYPE:  INP   LOCATION:                                       FACILITY:  [facility]   PHYSICIAN:  Mila Homer. Sherlean Foot, M.D.              DATE OF BIRTH:  [birth year]   DATE OF ADMISSION:  04/05/2003  DATE OF DISCHARGE:                                HISTORY & PHYSICAL   CHIEF COMPLAINT:  Right knee pain.   HISTORY OF PRESENT ILLNESS:  Mrs. Febres is a 75 year old female who has a  five-year history of gradual onset of right knee pain.  She recently  underwent a left total knee replacement in May 2004.  She is doing excellent  with her left knee replacement and has full extension and 120 degrees of  flexion.  Her right knee pain is intermittent and occurs whenever she first  arises from a chair or whenever she maneuvers steps.  She describes the pain  as sharp, clicks on contact, pain.  The knee does give way at times, and,  therefore, she uses a cane whenever she is going to walk a long distance or  go up and down steps.  She also uses an elastic knee support.  She denies  any popping or locking sensation.  Currently, she uses Tylenol and ice for  the pain and that gives her some minimal relief.  She denies having any  cortisone or __________ injections in the past.  X-rays of the right knee  show moderate valgus deformity on the right with moderate osteoarthritis.  Patient is to undergo right TKA on July 19. 2004 at [facility] by  Dr. Sherlean Foot.   ALLERGIES:  No known drug allergies.   CURRENT MEDICATIONS:  1. Zetia 10 mg 1 tab p.o. q.a.m.  2. Verelan PM 300 mg tab p.o. q.a.m.  3. Lotensin and HCTZ 20/12.5 mg 1 tab p.o. q.a.m.  4. Celebrex 20 mg 1 tab p.o. b.i.d.  5. Aspirin 81 mg one tab p.o. q.a.m.   PAST MEDICAL HISTORY:  1. Hypertension.  2. Hypercholesterolemia.  3. Seasonal allergies.  4. History of constipation.  5. History of DVT with a  subsequent pulmonary emboli in January 2000.   PAST SURGICAL HISTORY:  1. Surgery for enlarged urethra in 1970.  2. Hysterectomy in 1984.  3. Repair of rotator cuff, 2002.  4. Back surgery, 2003.  5. Left knee replacement, May 2004.   SOCIAL HISTORY:  Patient denies any tobacco use.  Drinks three glasses of  wine per day.  He is married.  Has two children.  Patient lives in a two-  story home with five steps at the usual entrance.  She is a retired Copywriter, advertising.   PRIMARY CARE PHYSICIAN:  Dr. Vincenza Hews.  Phone number is [phone].   FAMILY HISTORY:  Mother is deceased, unknown age.  Father deceased  of heart  disease.  Brother, age 55, has hypertension.  Sister died of unknown causes  in her mid 12s.   REVIEW OF SYSTEMS:  Positive for glasses.  He has shortness of breath with  exertion, going up and down the stairs or inclines.  However, she relates  that she swims and rides a bike daily without any shortness of breath.  She  denies any chest pain, PND, or orthopnea.  Patient has mild constipation,  Nocturia x 2.  Otherwise, review of systems is negative.   PHYSICAL EXAMINATION:  GENERAL:  Well-developed, well-nourished female who  walks with a limp, an antalgic gait.  Slight valgus deformity to the right  knee.  Mood and affect are appropriate.  Patient talks easily with examiner.  VITAL SIGNS:  Height 5 feet 9 inches, weight 190.  Temp 96.7, pulse 60,  respiratory rate 16, blood pressure 140/82.  CARDIAC:  Regular rate and rhythm.  No murmurs, rubs or gallops.  RESPIRATORY:  Clear to auscultation bilaterally.  No wheezes or rhonchi.  ABDOMEN:  Soft.  Nontender.  Bowel sounds in four quadrants.  NECK:  Trachea is midline.  No lymphadenopathy.  Carotids 2+ without bruits.  No tenderness along the C-spine.  Full range of motion of the cervical  spine.  BACK:  Nontender to palpation over the C&L spine.  HEENT:  Normocephalic and atraumatic without frontal or maxillary  tenderness  to palpation.  Conjunctivae are pink.  Sclerae are nonicteric.  PERRLA.  EOMs intact.  No visible external ear deformities.  Heavy cerumen  bilaterally.  Nasal septum midline.  Nasal mucosa is pink and moist without  polyps.  Right naris:  Some drainage is noted.  Buccal mucosa is pink and  moist.  Good dentition.  Pharynx without erythema or exudate.  Tongue and  uvula are midline.  BREASTS/GENITOURINARY/RECTAL:  Exams all deferred at this time.  MUSCULOSKELETAL:  Upper extremity full range of motion.  Neurovascularly  intact.  2+ radial pulses that are equal and symmetric.  Lower extremities:  Full range of motion of both hips.  The right knee, moderate amount of  crepitus.  Right knee lax 5 degrees in extension and flexes to 110 degrees.  The left knee, full flexion, full extension and flexion to 120 degrees.  The  incision on the left knee is well healed with some keloid formation.  There  is no erythema or ecchymosis noted.  There is some mild edema, but this is  reasonable for having surgery in May.  Strength in upper and lower  extremities 5/5 bilaterally.  Dorsalis pedis pulses 2+ bilaterally.  No  edema noted in the lower extremities.  NEUROLOGIC:  Alert and oriented x 3.  Cranial nerves II-XII grossly intact.  Deep tendon reflexes are 2+ bilaterally in the upper and lower extremities.   IMPRESSION:  1. Osteoarthritis, right knee.  2. Status post, left total knee arthroplasty, May 2004.  3. Hypertension.  4. Hypercholesterolemia.  5.     History of deep venous thrombosis with subsequent pulmonary emboli, January      2000.  6. Seasonal allergies.  7. History of constipation.     Richardean Canal, P.A.                       Mila Homer. Sherlean Foot, M.D.    GC/MEDQ  D:  03/30/2003  T:  03/30/2003  Job:  [id number]

## 2011-02-02 NOTE — Discharge Summary (Signed)
[place]. [facility]  Patient:    Amy Andrade                      MRN: [id number] Adm. Date:  [id number] Disc. Date: [id number] Attending:  Beverely Low Dictator:   Pincus Badder, P.A.                           Discharge Summary  ADMISSION DIAGNOSES: 1. Bilateral pulmonary embolie. 2. Rule out deep venous thrombosis of the left leg. 3. Hypertension.  DISCHARGE DIAGNOSES: 1. Bilateral pulmonary emboli. 2. Hypertension.  BRIEF HISTORY:  The patient suddenly became short of breath, especially on exertion starting late December.  She especially noticed it when climbing up and down stairs. She has also had left arm and left calf pain and left leg, ankle, and foot pain also.  Swelling in the foot and the swelling had gotten to the point where she could not wear her shoes.  The calf pain and tenderness progressed and also the  patient started having some chest wall tenderness.  Shortness of breath also got worse and therefore she presented to the office.  A CAT scan was ordered and based on the results of the bilateral pulmonary emboli, she was advised to go to the one ER immediately.  She has a history of hypertension, arthritis, depression, and igh cholesterol.  ALLERGIES:  Only a LACTOSE INTOLERANCE.  PHYSICAL EXAMINATION:  She had shortness of breath.  HEART: Regular rate.  No arrhythmias, no murmurs.  No S3, no S4.  No JVD and no bruits.  ABDOMEN: Negative. MUSCULOSKELETAL: She had decreased range of motion of her left shoulder and tenderness in her left shoulder and arm.  She had 2+ pitting edema to the lower  left leg.  Calf tenderness there also.  Pulse was 2+ bilaterally.  No lesions and no scars.  LABORATORY DATA:  Sodium 137, potassium 4.1, chloride 102, CO2 27, glucose 94, UN 10, creatinine 1.  Calcium 9.7.  Total protein 7.  Her PT was 13.4 seconds and TT was 25 seconds.  INR was 1.1.  EKG showed a normal sinus rhythm, only  right atrial enlargement on pulmonary disease pattern.  Her diagnosis was acute pulmonary emboli and hypertension.  HOSPITAL COURSE:  The patient was admitted to telemetry unit and started on IV heparin.  Claudication studies were ordered.  She responded well to the heparin. We had a goal of PT of 100 to 120 seconds and within a few days, the patient had gotten to about 111.  Her tenderness resolved slowly.  She still had some left ankle pain.  Blood pressure waxed and waned, but she did still have some pitting edema of the left ankle.  The venous Doppler studies were done to rule out a DVT there and they were found to be negative.  She continued to improve, then she was switched to p.o. Coumadin and was then started to ambulate.  She did have several bouts of lightheadedness during her stay, but as she started to get up and move  around that improved as well.  Blood pressure remained stable.  Coagulation studies were stable.  She was discharged after five to seven days of Coumadin.  CONDITION ON DISCHARGE:  Improved.  DISPOSITION:  She was discharged home with her husband.  DISCHARGE MEDICATIONS: 1. Coumadin 30 mg once daily. 2. Accuretic once a day.  ACTIVITIES:  She is to  watch sharp objects and watch falls and follow up in the  office in one week.  PROBLEM LIST: 1. Hypertension. 2. Pulmonary emboli. 3. Arthritis. 4. Hypercholesterolemia. 5. Depression. DD:  11/09/99 TD:  11/09/99 Job: [id number]

## 2011-02-02 NOTE — Op Note (Signed)
[place]. [facility]  Patient:    Amy Andrade, Amy Andrade                     MRN: [id number] Proc. Date: 03/26/00 Adm. Date:  [id number] Attending:  Wende Mott                           Operative Report  PREOPERATIVE DIAGNOSIS:  Impingement syndrome, left shoulder; subacromial bursitis, left shoulder.  POSTOPERATIVE DIAGNOSIS:  Impingement syndrome, left shoulder, subacromial bursitis, left shoulder.  ANESTHESIA:  General.  PROCEDURE:  Arthroscopic acromioplasty, left shoulder.  DESCRIPTION:  Patient was taken to the operating room and after given adequate preoperative medication was given general anesthesia and intubated.  Left shoulder was prepped with Duraprep and draped in a sterile manner.  Patient was in a barber-chair position.  Left shoulder draped in a sterile fashion. One-half inch puncture wound was made posterior to the left shoulder, followed by use of Swisher wand from posterior to anterior, allowing for the anterior exposure.  The ______ was placed through the cannula through the anterior incison.  The arthroscope was placed posteriorly.  The ______ incision was placed laterally.  Inspection of the joint revealed tremendously hypertrophic subacromail bursa sac with thickening and ______ of the subacromial surface. With the synovial shaver, complete synovectomy was done, followed by ______ .  ______ adequate subcromial plasty and debridement of the area.  ______ ______ to restart her Coumadin, ice packs, and ______.  Patient was to return to the office in one week. DD:  03/26/00 TD:  03/26/00 Job: [id number]

## 2011-02-02 NOTE — Op Note (Signed)
NAME:  Amy Andrade, Amy Andrade                        ACCOUNT NO.:  [id number]   MEDICAL RECORD NO.:  [id number]                   PATIENT TYPE:  AMB   LOCATION:  ENDO                                 FACILITY:  [facility]   PHYSICIAN:  Anselmo Rod, M.D.               DATE OF BIRTH:  Apr 19, 1936   DATE OF PROCEDURE:  01/13/2003  DATE OF DISCHARGE:                                 OPERATIVE REPORT   PROCEDURE PERFORMED:  Screening colonoscopy.   ENDOSCOPIST:  Charna Elizabeth, M.D.   INSTRUMENT USED:  Olympus video colonoscope.   INDICATIONS FOR PROCEDURE:  The patient is a 75 year old African-American  female who has a history of constipation.  She is undergoing screening  colonoscopy to rule out colonic polyps, masses, etc.   PREPROCEDURE PREPARATION:  Informed consent was procured from the patient.  The patient was fasted for eight hours prior to the procedure and prepped  with a bottle of magnesium citrate and a gallon of GoLYTELY (which the  patient claims she consumed completely) the night prior to the procedure.   PREPROCEDURE PHYSICAL:  The patient had stable vital signs.  Neck supple.  Chest clear to auscultation.  S1 and S2 regular.  Abdomen soft with normal  bowel sounds.  A well-healed surgical scar was in the midline below the  umbilicus from previous abdominal surgery.   DESCRIPTION OF PROCEDURE:  The patient was placed in left lateral decubitus  position and sedated with 5 mg of Demerol and 5 mg of Versed intravenously.  Once the patient was adequately sedated and maintained on low flow oxygen  and continuous cardiac monitoring, the Olympus video colonoscope was  advanced from the rectum to the cecum with extreme difficulty.  There was a  large amount of residual stool in the colon.  Multiple washes were done.  The patient's position was changed from the left lateral to the supine and  the right lateral position and abdominal pressure was applied to reach the  cecal base.  The  appendicular orifice and ileocecal valve were clearly  visualized and photographed.  No masses, polyps, erosions, ulcerations or  diverticula were seen.  Small internal hemorrhoids were seen on  retroflexion.  Small lesions could have been missed.   IMPRESSION:  1. No masses, polyps, or diverticulosis noted.  2. Large amount of residual stool in the colon.  Small lesions could have     been missed.  3. Small internal hemorrhoids seen on retroflexion.   RECOMMENDATIONS:  1. A high fiber diet with liberal fluid intake has been advocated.  20 to     25g of fiber in the diet have been recommended every day with 80 to 90     ounces of fluid.  2. Repeat colorectal cancer screening was recommended in the next five years     unless the patient develops any abnormal symptoms in the interim.  3. Outpatient follow-up on  a p.r.n. basis.                                               Anselmo Rod, M.D.    JNM/MEDQ  D:  01/13/2003  T:  01/13/2003  Job:  [id number]   cc:   Renaye Rakers, M.D.  [phone] [address], [address]  [place]  [place] [address]  Fax: [phone]

## 2011-02-02 NOTE — Op Note (Signed)
[place]  Patient:    Amy Andrade, Amy Andrade Visit Number: [id number] MRN: [id number]          Service Type: SUR Location: 3000 3021 01 Attending Physician:  Mariam Dollar Dictated by:   Garlon Hatchet., M.D. Proc. Date: 02/04/02 Admit Date:  02/04/2002                             Operative Report  PREOPERATIVE DIAGNOSES: 1. Severe degenerative disk disease, L4-5. 2. Right L4 radiculopathy. 3. Degenerative spondylolisthesis, L4-5.  PROCEDURES: 1. Decompressive lumbar laminectomy, L4-5. 2. Posterior lumbar interbody fusion, L4-5, using 8 x 26 mm Tangent allograft    wedges. 3. Posterolateral arthrodesis using locally-harvested autograft. 4. Pedicle screw fixation, L4-5, using the M10 CD Horizon pedicle screw    system with 6.5 x 45 screws at L4 and 6.5 x 40 at L5. 5. Placement of medium Hemovac drain.  SURGEON:  Garlon Hatchet., M.D.  ASSISTANT:  Julio Sicks, M.D.  ANESTHESIA:  General endotracheal.  CLINICAL NOTE:  The patient is a very pleasant 75 year old female who has had longstanding back and leg pain refractory to conservative treatment of physical therapy, epidural steroid injections, and anti-inflammatories. Preoperative imaging showed degenerative spondylolisthesis at L4-5 with severe facet arthropathy and a lateral disk rupture compressing the right L5 nerve root.  The patients symptomatology consisted predominantly of mechanical low back pain and right leg radiculopathy in L4 distribution.  This is felt to be due to degenerative collapse at L4-5 and a lateral disk rupture compressing the L4 nerve root.  The patient was recommended a decompressive stabilization procedure.  I extensively went over the risks and benefits of lumbar interbody fusion with her, and she understood and agreed to proceed forward.  DESCRIPTION OF PROCEDURE:  The patient was brought in the OR, was induced under general anesthesia, positioned prone on the  Wilson frame.  The back was prepped and draped in the usual sterile fashion.  A midline incision was made after infiltration of 10 cc of lidocaine with epinephrine.  Bovie electrocautery was used to take down through the subcutaneous tissue, and subperiosteal dissection was carried out in the laminae of L3, L4, and L5 bilaterally.  Intraoperative x-ray confirmed localization of the L3 pedicle. Attention was then taken to the interspace below this.  The L4 pedicle was identified, and the remainder of the TPs at L4 and L5 were exposed and a self-retaining retractor was placed.  The facets at L4-5 were noted to be severely hypertrophied and degenerated.  These were all removed with a Leksell rongeur, and the spinous process at L4 was also removed with a Leksell rongeur.  The laminotomy was continued with a 3 and 4 mm Kerrison punch, completing a radical decompressive laminectomy and medial facetectomy, exposing the proximal aspect of the L4 and L5 nerve roots.  Using a 3 and 4 mm Kerrison punch, the remainder of the L4 nerve root was radically decompressed out its foramen as well as the L5 nerve root, and the disk space was identified.  Epidural veins were coagulated.  Attention was first taken to the diskectomy.  Using the DErrico nerve root retractor and reflecting the L4 nerve root medially on the right, the disk space was entered and noted to be severely degenerated and collapsed.  An 8 mm distractor was inserted.  Then sequentially on the other side the annulotomy was made.  Pituitary rongeurs were used to  clean out the remainder of the degenerated disk, and then the 9 mm distractor was inserted.  Then attention was turned to preparing the end plate to receive the Tangent allograft.  Using the size 8 chisel, the end plates were prepared using fluoroscopy to confirm depth and trajectory.  The remainder of the disk was cleaned out after it was chiseled, and then the 8 x 26 mm Tangent  allograft was inserted on the patients left side. Attention was taken to the right side.  The distractor was removed.  This end plate was also prepared with a chisel, and downgoing Epstein curettes were used to scrape the central end plates.  The remaining locally-harvested autograft was packed medially against the allograft on the patients left side then the 8 x 26 mm Tangent allograft was inserted on the patients right side. Both allografts were inserted about 2 mm depth to the posterior vertebral body line, and fluoroscopy confirmed good depth and position.  Then attention was taken to the pedicle screw placement.  Pilot holes were drilled with a high-speed drill.  Using fluoroscopy and intracanalicular inspection, the medial wall as well as the inferior wall of the pedicle were identified, pilot holes were drilled, the pedicles were cannulated, probed, tapped with a 5.5 tap, probed again, and a 6.5 x 45 pedicle screw was inserted at L4 on the patients left side.  This procedure was repeated at L5 on the left side and then also on the right at L4 and L5.  Both 6.5 x 45 mm screws were inserted at L4 and 6.5 x 40s at L5.  All pedicles when probed were competent in 360 degree orientation, and fluoroscopy confirmed depth at every step along the way.  The wound was copiously irrigated and meticulous hemostasis was maintained.  The lateral TPs and gutters were aggressively decorticated.  The remainder of the locally-harvested autograft was packed in the lateral gutters.  Then the rods were sized and two 40 mm rods, prelordosed, were also additionally bent and inserted.  Top-tightening nuts were tightened down at L5.  The L4 pedicle screw was compressed against L5.  The neural foramina were explored with the angled hockey stick.  Postop fluoroscopy confirmed good depth and position of bone grafts and pedicle screws and rods.  Then Gelfoam was overlaid on top of the dura.  The fascia and muscle  were reapproximated with 0 interrupted Vicryl, the subcutaneous tissue was closed with 2-0 interrupted Vicryl, the skin closed with 4-0 interrupted subcuticular.  Benzoin and Steri-Strips were applied.  The patient went to the recovery room in stable condition.  At the end of the case all needle counts and sponge counts were correct. Dictated by:   Garlon Hatchet., M.D. Attending Physician:  Mariam Dollar DD:  02/04/02 TD:  02/06/02 Job: [id number]

## 2011-02-02 NOTE — Discharge Summary (Signed)
[place]  Patient:    Amy Andrade, Amy Andrade Visit Number: [id number] MRN: [id number]          Service Type: SUR Location: 3000 3021 01 Attending Physician:  Mariam Dollar Dictated by:   Garlon Hatchet., M.D. Admit Date:  02/04/2002 Discharge Date: 02/07/2002                             Discharge Summary  ADMITTING DIAGNOSIS:  Discogenic mechanical low back pain of L4-5 with spondylolisthesis of L4-5 and right L4 radiculopathy.  PROCEDURES:  Include decompressive laminectomy and lumbar interbody fusion of L4-5.  HOSPITAL COURSE:  The patient is a 75 year old female who was admitted as an EMA, and underwent the aforementioned procedure.  Postoperatively, she did very well and went to the recovery room to the floor.  On the floor was afebrile with stable vital signs.  Physical therapy began working with the patient immediately postoperatively.  Was mobilizing well and pain was well-controlled with pills.  Voiding spontaneously and the Hemovac drain was able to be taken out on hospital day #2.  By hospital day #3, the patient was ambulating well with a walker, stable, and can be discharged home.  Discharged on Percocet for pain and scheduled for follow up in two weeks. Dictated by:   Garlon Hatchet., M.D. Attending Physician:  Mariam Dollar DD:  02/27/02 TD:  02/27/02 Job: [id number]

## 2011-02-20 ENCOUNTER — Other Ambulatory Visit (HOSPITAL_COMMUNITY): Payer: Self-pay | Admitting: Family Medicine

## 2011-02-20 DIAGNOSIS — Z1231 Encounter for screening mammogram for malignant neoplasm of breast: Secondary | ICD-10-CM

## 2011-03-01 ENCOUNTER — Ambulatory Visit (HOSPITAL_COMMUNITY)
Admission: RE | Admit: 2011-03-01 | Discharge: 2011-03-01 | Disposition: A | Discharge status: Home / Self Care | Payer: Medicare Other | Source: Ambulatory Visit | Attending: Family Medicine | Admitting: Family Medicine

## 2011-03-01 DIAGNOSIS — Z1231 Encounter for screening mammogram for malignant neoplasm of breast: Secondary | ICD-10-CM | POA: Insufficient documentation

## 2011-06-08 LAB — CBC
MCHC: 34.4
RDW: 12.9

## 2011-06-08 LAB — POCT CARDIAC MARKERS
CKMB, poc: 6.9
Operator id: 146091
Troponin i, poc: <0.05

## 2011-06-08 LAB — DIFFERENTIAL
Basophils Absolute: 0
Eosinophils Relative: 2
Lymphocytes Relative: 43

## 2011-06-22 ENCOUNTER — Encounter (INDEPENDENT_AMBULATORY_CARE_PROVIDER_SITE_OTHER): Payer: Self-pay | Admitting: Surgery

## 2011-06-22 ENCOUNTER — Ambulatory Visit (INDEPENDENT_AMBULATORY_CARE_PROVIDER_SITE_OTHER): Payer: Medicare Other | Admitting: Surgery

## 2011-06-22 VITALS — BP 138/74 | HR 72 | Temp 96.9°F | Resp 16 | Ht 69.0 in | Wt 201.0 lb

## 2011-06-22 DIAGNOSIS — L989 Disorder of the skin and subcutaneous tissue, unspecified: Secondary | ICD-10-CM

## 2011-06-22 NOTE — Patient Instructions (Signed)
Hold your aspirin between now and the day of the procedure.  We will remove this skin lesion in the office next week.

## 2011-06-22 NOTE — Progress Notes (Signed)
Chief Complaint  Patient presents with  . Other    infected cyst under left axilla     HPI Amy Andrade is a 75 y.o. female who is about 30 years s/p excision of a "cyst" under her left axilla.  About a year ago, she began to notice some redness and thickening of the skin at one end of her scar.  No deep infection.  Minimal discomfort.  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 HPI  Past Medical History  Diagnosis Date  . Asthma   . Hyperlipidemia   . Hypertension     History reviewed. No pertinent past surgical history.  Family History  Problem Relation Age of Onset  . Heart disease Father     Social History History  Substance Use Topics  . Smoking status: Former Games developer  . Smokeless tobacco: Never Used  . Alcohol Use: 8.4 oz/week    14 Glasses of wine per week    No Known Allergies  Current Outpatient Prescriptions  Medication Sig Dispense Refill  . aspirin 81 MG tablet Take 81 mg by mouth daily.        . budesonide-formoterol (SYMBICORT) 160-4.5 MCG/ACT inhaler Inhale 2 puffs into the lungs 2 (two) times daily.        . cycloSPORINE (RESTASIS) 0.05 % ophthalmic emulsion Apply 1 drop to eye 2 (two) times daily.        . montelukast (SINGULAIR) 10 MG tablet Take 10 mg by mouth at bedtime.        . Olmesartan-Amlodipine-HCTZ (TRIBENZOR PO) Take by mouth daily.          Review of Systems Review of Systems ROS otherwise negative Blood pressure 138/74, pulse 72, temperature 96.9 F (36.1 C), resp. rate 16, height 5\' 9"  (1.753 m), weight 201 lb (91.173 kg).  Physical Exam Physical Exam WDWN in NAD Left axilla - healed 7 cm incision.  The anterior 3 cm shows some reddish thickening of the skin - well-demarcated, not inflamed, non-tender.  This seems very superficial with no deep fluctuance. Data Reviewed None  Assessment    Skin lesion in old incision (3 cm) - left axilla - possible skin cancer vs. benign skin lesion    Plan      Excisional biopsy of left axillary skin lesion under local anesthesia. Will schedule for next week.       Dossie Ocanas K. 06/22/2011, 5:25 PM

## 2011-06-26 ENCOUNTER — Ambulatory Visit (INDEPENDENT_AMBULATORY_CARE_PROVIDER_SITE_OTHER): Payer: Medicare Other | Admitting: Surgery

## 2012-07-15 DIAGNOSIS — R413 Other amnesia: Secondary | ICD-10-CM

## 2012-12-11 ENCOUNTER — Encounter (HOSPITAL_COMMUNITY): Payer: Self-pay | Admitting: Adult Health

## 2012-12-11 ENCOUNTER — Emergency Department (HOSPITAL_COMMUNITY)
Admission: EM | Admit: 2012-12-11 | Discharge: 2012-12-12 | Disposition: A | Discharge status: Home / Self Care | Payer: Medicare Other | Attending: Emergency Medicine | Admitting: Emergency Medicine

## 2012-12-11 DIAGNOSIS — E785 Hyperlipidemia, unspecified: Secondary | ICD-10-CM | POA: Insufficient documentation

## 2012-12-11 DIAGNOSIS — M25511 Pain in right shoulder: Secondary | ICD-10-CM

## 2012-12-11 DIAGNOSIS — J45909 Unspecified asthma, uncomplicated: Secondary | ICD-10-CM | POA: Insufficient documentation

## 2012-12-11 DIAGNOSIS — Z79899 Other long term (current) drug therapy: Secondary | ICD-10-CM | POA: Insufficient documentation

## 2012-12-11 DIAGNOSIS — M25519 Pain in unspecified shoulder: Secondary | ICD-10-CM | POA: Insufficient documentation

## 2012-12-11 DIAGNOSIS — Z87891 Personal history of nicotine dependence: Secondary | ICD-10-CM | POA: Insufficient documentation

## 2012-12-11 DIAGNOSIS — Z7982 Long term (current) use of aspirin: Secondary | ICD-10-CM | POA: Insufficient documentation

## 2012-12-11 DIAGNOSIS — I10 Essential (primary) hypertension: Secondary | ICD-10-CM | POA: Insufficient documentation

## 2012-12-11 LAB — BASIC METABOLIC PANEL
BUN: 14 mg/dL (ref 6–23)
CO2: 25 mEq/L (ref 19–32)
Calcium: 9.5 mg/dL (ref 8.4–10.5)
Creatinine, Ser: 0.78 mg/dL (ref 0.50–1.10)
GFR calc Af Amer: >90 mL/min (ref >90)
Glucose, Bld: 115 mg/dL — ABNORMAL HIGH (ref 70–99)
Potassium: 4 mEq/L (ref 3.5–5.1)

## 2012-12-11 LAB — CBC WITH DIFFERENTIAL/PLATELET
Basophils Absolute: 0 10*3/uL (ref 0.0–0.1)
HCT: 40.5 % (ref 36.0–46.0)
Hemoglobin: 14.7 g/dL (ref 12.0–15.0)
Lymphs Abs: 2.2 10*3/uL (ref 0.7–4.0)
MCHC: 36.3 g/dL — ABNORMAL HIGH (ref 30.0–36.0)
MCV: 94.4 fL (ref 78.0–100.0)
Neutro Abs: 2.7 10*3/uL (ref 1.7–7.7)
Neutrophils Relative %: 50 % (ref 43–77)
RDW: 12.9 % (ref 11.5–15.5)

## 2012-12-11 LAB — POCT I-STAT TROPONIN I

## 2012-12-11 MED ORDER — IBUPROFEN 400 MG PO TABS: 600.0000 mg | ORAL_TABLET | Freq: Once | ORAL | Status: DC

## 2012-12-11 NOTE — ED Notes (Addendum)
Presents with right sided arm pain that radiates into the right chest associated with high blood pressure and "upset stomach" pain began yesterday afternoon.  Pain is described as ache, rest makes pain better. BP here is 162/67. Pt denies headache, dizziness, SOB.

## 2012-12-11 NOTE — ED Provider Notes (Addendum)
History     CSN: [id number]  Arrival date & time 12/11/12  2044   First MD Initiated Contact with Patient 12/11/12 2153      Chief Complaint  Patient presents with  . Hypertension    (Consider location/radiation/quality/duration/timing/severity/associated sxs/prior treatment) Patient is a 77 y.o. female presenting with hypertension and shoulder pain.  Hypertension This is a chronic problem. Progression since onset: worse today. Pertinent negatives include no abdominal pain, chest pain, congestion, coughing, fever, headaches, nausea, vomiting or weakness. Nothing aggravates the symptoms. She has tried nothing for the symptoms.  Shoulder Pain This is a new (right) problem. Episode onset: 2 weeks ago. The problem occurs constantly. The problem has been unchanged. Pertinent negatives include no abdominal pain, chest pain, congestion, coughing, fever, headaches, nausea, vomiting or weakness. Treatments tried: tylenol PM. The treatment provided mild relief.    Past Medical History  Diagnosis Date  . Asthma   . Hyperlipidemia   . Hypertension     History reviewed. No pertinent past surgical history.  Family History  Problem Relation Age of Onset  . Heart disease Father     History  Substance Use Topics  . Smoking status: Former Games developer  . Smokeless tobacco: Never Used  . Alcohol Use: 8.4 oz/week    14 Glasses of wine per week    OB History   Grav Para Term Preterm Abortions TAB SAB Ect Mult Living                  Review of Systems  Constitutional: Negative for fever.  HENT: Negative for congestion.   Respiratory: Negative for cough and shortness of breath.   Cardiovascular: Negative for chest pain.  Gastrointestinal: Negative for nausea, vomiting, abdominal pain and diarrhea.  Genitourinary: Negative for difficulty urinating.  Neurological: Negative for weakness and headaches.  All other systems reviewed and are negative.    Allergies  Review of patient's  allergies indicates no known allergies.  Home Medications   Current Outpatient Rx  Name  Route  Sig  Dispense  Refill  . aspirin 81 MG tablet   Oral   Take 81 mg by mouth daily.           . budesonide-formoterol (SYMBICORT) 160-4.5 MCG/ACT inhaler   Inhalation   Inhale 2 puffs into the lungs 2 (two) times daily.           . cycloSPORINE (RESTASIS) 0.05 % ophthalmic emulsion   Both Eyes   Place 1 drop into both eyes daily as needed (conjunctivitis).          . Dextromethorphan Polistirex (COUGH DM PO)   Oral   Take 1 lozenge by mouth at bedtime.         . montelukast (SINGULAIR) 10 MG tablet   Oral   Take 10 mg by mouth at bedtime.           . Olmesartan-Amlodipine-HCTZ (TRIBENZOR) 40-10-12.5 MG TABS   Oral   Take 1 tablet by mouth every morning.           BP 162/67  Pulse 80  Temp(Src) 98.2 F (36.8 C) (Oral)  Resp 16  SpO2 99%  Physical Exam  Nursing note and vitals reviewed. Constitutional: She is oriented to person, place, and time. She appears well-developed and well-nourished. No distress.  HENT:  Head: Normocephalic and atraumatic.  Mouth/Throat: Oropharynx is clear and moist.  Eyes: Conjunctivae are normal. Pupils are equal, round, and reactive to light. No scleral icterus.  Neck:  Neck supple.  Cardiovascular: Normal rate, regular rhythm, normal heart sounds and intact distal pulses.   No murmur heard. Pulmonary/Chest: Effort normal and breath sounds normal. No stridor. No respiratory distress. She has no rales.  Abdominal: Soft. Bowel sounds are normal. She exhibits no distension. There is no tenderness.  Musculoskeletal: Normal range of motion.       Right shoulder: She exhibits tenderness and spasm. She exhibits normal pulse and normal strength.       Left elbow: She exhibits normal range of motion, no swelling, no effusion and no deformity. Tenderness found. Medial epicondyle and lateral epicondyle tenderness noted.  Neurological: She is  alert and oriented to person, place, and time.  Skin: Skin is warm and dry. No rash noted.  Psychiatric: She has a normal mood and affect. Her behavior is normal.    ED Course  Procedures (including critical care time)  Labs Reviewed  CBC WITH DIFFERENTIAL - Abnormal; Notable for the following:    MCH 34.3 (*)    MCHC 36.3 (*)    All other components within normal limits  BASIC METABOLIC PANEL - Abnormal; Notable for the following:    Sodium 131 (*)    Chloride 93 (*)    Glucose, Bld 115 (*)    GFR calc non Af Amer 80 (*)    All other components within normal limits  POCT I-STAT TROPONIN I   No results found.   Date: 12/11/2012  Rate: 67  Rhythm: normal sinus rhythm  QRS Axis: normal  Intervals: normal  ST/T Wave abnormalities: normal  Conduction Disutrbances:none  Narrative Interpretation:   Old EKG Reviewed: none available   1. Hypertension   2. Right shoulder pain       MDM   77 yo female with HTN and right shoulder pain.  No signs/symptoms of hypertensive end organ damage.  Story not consistent with ACS.  Labwork obtained in triage and was normal.  She has right shoulder pain, tenderness, and painful active ROM in the setting of recently shoveling snow.  Also has mild tenderness at elbow epicondyles.  No trauma, no need for imaging.  Plan to treat with ibuprofen and DC home.  She has PCP appointment tomorrow for recheck of BP.         Rennis Petty, MD 12/11/12 1610  Gwyneth Sprout, MD  I saw and evaluated the patient, reviewed the resident's note and I agree with the findings and plan. I have reviewed EKG and agree with the resident interpretation.   Patient with musculoskeletal-type shoulder/chest pain that started after shoveling snow. Worse with arm movement but no shortness of breath. Tenderness on palpation of the right shoulder and right upper chest. Has an appointment with her PCP tomorrow. 12/12/12 0013  Gwyneth Sprout, MD 12/12/12  [id number]

## 2012-12-12 NOTE — ED Notes (Signed)
Pt discharged.Vital signs stable and GCS 15 

## 2014-08-30 ENCOUNTER — Ambulatory Visit: Payer: Self-pay | Admitting: Internal Medicine

## 2014-12-20 ENCOUNTER — Telehealth: Payer: Self-pay | Admitting: General Practice

## 2014-12-20 ENCOUNTER — Ambulatory Visit: Payer: Self-pay | Admitting: Internal Medicine

## 2014-12-20 DIAGNOSIS — Z0289 Encounter for other administrative examinations: Secondary | ICD-10-CM

## 2014-12-20 NOTE — Telephone Encounter (Signed)
Spoke with Amy Andrade,  [name], [place] And[place].  Says patients husband, also a patient of Dr. Renato Gailseed  is on dialyisis 3 days weekly at 8:00am.  Says they had some communication breakdown and no one rescheduled the appointment.  Amy Andrade says she will take responsibility for the appointments.  I spoke with Dr. Renato Gailseed, says will approve this one last time to try and establish with [facility].  Amy Andrade request that we contact her in the future to confirm their appointment, says she will also make sure the New Patient Package is sent in asap.  Nobie Putnamold Amy Andrade, Amy Andrade needs to be here with in 20 to 30 minuites prior to appt scheduled time of 9:00am.  I also informed Amy Andrade that we would charge a No Show fee of $50.00 for the 12/20/14 appt.she responded no problem.....cdavis

## 2015-01-08 DIAGNOSIS — H11123 Conjunctival concretions, bilateral: Secondary | ICD-10-CM | POA: Diagnosis not present

## 2015-01-08 DIAGNOSIS — H578 Other specified disorders of eye and adnexa: Secondary | ICD-10-CM | POA: Diagnosis not present

## 2015-03-07 ENCOUNTER — Encounter: Payer: Self-pay | Admitting: Internal Medicine

## 2015-03-07 ENCOUNTER — Ambulatory Visit (INDEPENDENT_AMBULATORY_CARE_PROVIDER_SITE_OTHER): Payer: Medicare Other | Admitting: Internal Medicine

## 2015-03-07 VITALS — BP 118/82 | HR 66 | Temp 97.3°F | Resp 20 | Ht 67.5 in | Wt 201.4 lb

## 2015-03-07 DIAGNOSIS — G309 Alzheimer's disease, unspecified: Secondary | ICD-10-CM | POA: Diagnosis not present

## 2015-03-07 DIAGNOSIS — J45909 Unspecified asthma, uncomplicated: Secondary | ICD-10-CM | POA: Insufficient documentation

## 2015-03-07 DIAGNOSIS — F1010 Alcohol abuse, uncomplicated: Secondary | ICD-10-CM

## 2015-03-07 DIAGNOSIS — F0280 Dementia in other diseases classified elsewhere without behavioral disturbance: Secondary | ICD-10-CM

## 2015-03-07 DIAGNOSIS — I10 Essential (primary) hypertension: Secondary | ICD-10-CM | POA: Diagnosis not present

## 2015-03-07 DIAGNOSIS — G4700 Insomnia, unspecified: Secondary | ICD-10-CM | POA: Diagnosis not present

## 2015-03-07 DIAGNOSIS — R413 Other amnesia: Secondary | ICD-10-CM | POA: Insufficient documentation

## 2015-03-07 DIAGNOSIS — J4520 Mild intermittent asthma, uncomplicated: Secondary | ICD-10-CM | POA: Diagnosis not present

## 2015-03-07 DIAGNOSIS — E785 Hyperlipidemia, unspecified: Secondary | ICD-10-CM | POA: Diagnosis not present

## 2015-03-07 MED ORDER — MELATONIN 3 MG PO CAPS: 1.0000 | ORAL_CAPSULE | Freq: Every evening | ORAL | Status: DC | PRN

## 2015-03-07 MED ORDER — ALBUTEROL SULFATE HFA 108 (90 BASE) MCG/ACT IN AERS: 2.0000 | INHALATION_SPRAY | Freq: Four times a day (QID) | RESPIRATORY_TRACT | Status: DC | PRN

## 2015-03-07 NOTE — Patient Instructions (Signed)
Please try to cut back on your drinking to less than 2 glasses per day.  I recommend you use melatonin to help you sleep 3-5mg  as needed at bedtime.  This would replace your tylenol PM.

## 2015-03-07 NOTE — Progress Notes (Signed)
Patient ID: Amy Andrade, female   DOB: July 15, 1936, 79 y.o.   MRN: [id number]   Location:  [facility]  Goals of Care: Advanced Directive information Does patient have an advance directive?: Yes, Type of Advance Directive: Healthcare Power of [place];Living will, Does patient want to make changes to advanced directive?: No - Patient declined   Chief Complaint  Patient presents with  . Establish Care    Establish care    HPI: Patient is a 79 y.o. female seen in the office today to establish with the practice.  Her husband is a patient.  Their son, Clovis Riley, is with them today.  Thinks she has had all of her vaccines--we need the records.    Memory:  Pt feels like it's ok.  Says she can keep on schedule.  Her son notes that her short-term memory is gone.  Long-term is great.  Says she can take her meds and doesn't have many appts and Clovis Riley tries to keep them on track.  She has difficulty with the calendar.  Her son thinks her meds are not right either.  Seems she is not taking the namenda, but [facility] has not responded about whether she should be on it.  Has not been there in at least 6 mos.  She is unsure if she is still taking the prevagen.  Thinks she stopped it b/c [facility] told her they weren't selling it b/c it wasn't getting good results.    BP has been well controlled recently.  Reading today wnl.  Does not take at home very often.    Some tightness of chest if gets sob or wheezy.  Does not have a rescue inhaler.    Eyes bother her.  Head felt funny.  Fullness sensation.  Thought it was the weather.  Vision not affected.  Several in family have some respiratory infection.    Starts drinking wine at 9am.  Has a glass of wine and coffee with breakfast.  Her son says she does not stop drinking.  Has a lot of boxes of wine next to the fridge.  Does not drink so much that she passes out.  She only drinks when she eats and does not  drink after dinner.  2-3 glasses per day per pt.    She c/o back pain--she says not like it used to be.  Her son notes that if she stands up in the kitchen to cook dinner. Doesn't like the strong pain meds.  Wears a girdle all the time.  Sometimes can barely move.  Says she had back surgery "a long time ago".  It's not in this system.  She has seen Dr. Franky Macho.    Takes tylenol pm each night.  Otherwise wakes up and stays awake.  Says her back does not hurt then.  She's always had trouble sleeping.  Denies sadness or depression.  Says she is happy and content.    Tripped on uneven pavement at a doctor's office 3-4 years ago.  No other falls since.  Pt thinks her cholesterol was controlled with the pravachol she is taking last time it was checked.  Had PE in 2004 and was treated with coumadin for 6 mos to a year at that time.  Pt and son do not recall any DVT with this.  Await records.    2012, had infected cyst under left axilla.  Now has keloid there.    Review of Systems:  Review of Systems  Constitutional: Negative for fever, chills and malaise/fatigue.  HENT: Negative for congestion and hearing loss.   Eyes: Positive for pain. Negative for blurred vision.  Respiratory: Positive for wheezing. Negative for shortness of breath.   Cardiovascular: Negative for chest pain and leg swelling.  Gastrointestinal: Negative for abdominal pain, constipation, blood in stool and melena.  Genitourinary: Negative for dysuria, urgency and frequency.  Musculoskeletal: Positive for back pain. Negative for myalgias, joint pain, falls and neck pain.  Skin: Negative for rash.  Neurological: Negative for dizziness, loss of consciousness, weakness and headaches.  Endo/Heme/Allergies: Does not bruise/bleed easily.  Psychiatric/Behavioral: Positive for memory loss. Negative for depression. The patient has insomnia. The patient is not nervous/anxious.     Past Medical History  Diagnosis Date  . Asthma   .  Hyperlipidemia   . High blood pressure   . Alzheimer disease   . Hypertension     Past Surgical History  Procedure Laterality Date  . Replacement total knee      x2  . Bunionectomy      No Known Allergies Medications: Patient's Medications  New Prescriptions   No medications on file  Previous Medications   APOAEQUORIN (PREVAGEN) 10 MG CAPS    Take 1 tablet by mouth daily   ASPIRIN 81 MG TABLET    Take 81 mg by mouth daily.     BUDESONIDE-FORMOTEROL FUMARATE (SYMBICORT IN)    Inhale into the lungs. Symbicort 120/4.5 1 puff 2 x a day   CYCLOSPORINE (RESTASIS) 0.05 % OPHTHALMIC EMULSION    Place 1 drop into both eyes daily as needed (conjunctivitis).    DIPHENHYDRAMINE-ACETAMINOPHEN (TYLENOL PM EXTRA STRENGTH) 25-500 MG TABS    Take 1 tablet by mouth at bedtime as needed.   DONEPEZIL (ARICEPT) 10 MG TABLET    Take 10 mg by mouth daily.   MONTELUKAST (SINGULAIR) 10 MG TABLET    Take 10 mg by mouth at bedtime.     OLMESARTAN-AMLODIPINE-HCTZ (TRIBENZOR) 40-10-12.5 MG TABS    Take 1 tablet by mouth every morning.   PRAVASTATIN (PRAVACHOL) 40 MG TABLET    Take 40 mg by mouth daily.   PSEUDOEPHEDRINE-ACETAMINOPHEN (CEPACOL SORE THROAT PO)    Take by mouth. Take 1 tablet by mouth as needed  Modified Medications   No medications on file  Discontinued Medications   DEXTROMETHORPHAN POLISTIREX (COUGH DM PO)    Take 1 lozenge by mouth at bedtime.    Physical Exam: Filed Vitals:   03/07/15 0907  BP: 118/82  Pulse: 66  Temp: 97.3 F (36.3 C)  TempSrc: Oral  Resp: 20  Height: 5' 7.5" (1.715 m)  Weight: 201 lb 6.4 oz (91.354 kg)  SpO2: 95%   Physical Exam  Constitutional: She appears well-developed and well-nourished. No distress.  Cardiovascular: Normal rate, regular rhythm, normal heart sounds and intact distal pulses.   Pulmonary/Chest: Effort normal and breath sounds normal. No respiratory distress. She has no wheezes.  Abdominal: Soft. Bowel sounds are normal. She exhibits no  distension. There is no tenderness.  Musculoskeletal: Normal range of motion.  Heberden's and bouchard's nodes; no tenderness of her lumbosacral spine  Neurological: She is alert.  Oriented to person and place, but does not recall that this is her first visit (has come previously with her husband)  Skin: Skin is warm and dry.  Psychiatric: She has a normal mood and affect. Her behavior is normal.    Labs reviewed: need records  Procedures:  Need records  for vaccines and preventive care procedures  Assessment/Plan 1. Alzheimer disease -d/c prevagen -cont aricept -consider restarting namenda XR if she did not have a problem from this in the past--need records to know but her son found a bunch of full bottles around the house - Basic metabolic panel; Future  2. Asthma, mild intermittent, uncomplicated - no recent chest tightness or wheezing, did not have a rescue inhaler so one was prescribed today  -cont singulair and symbicort - CBC with Differential/Platelet; Future - Basic metabolic panel; Future - albuterol (PROVENTIL HFA;VENTOLIN HFA) 108 (90 BASE) MCG/ACT inhaler; Inhale 2 puffs into the lungs every 6 (six) hours as needed for wheezing or shortness of breath.  Dispense: 1 Inhaler; Refill: 3  3. Hyperlipidemia -cont pravachol, check labs before physical - Hemoglobin A1c; Future - Lipid panel; Future - Hepatic Function Panel; Future  4. Essential hypertension -bp at goal with current therapy  - Basic metabolic panel; Future  5. Alcohol abuse - advised not to drink more than 2 glasses of wine per day and to avoid use of tylenol pm which can worsen confusion, increase fall risk and could damage her liver in combo with the alcohol - CBC with Differential/Platelet; Future - Basic metabolic panel; Future - Hepatic Function Panel; Future  6. Memory loss - check basic labs -cont aricept, would like her to also be on namenda XR - Basic metabolic panel; Future  Labs/tests  ordered:   Orders Placed This Encounter  Procedures  . CBC with Differential/Platelet    Standing Status: Future     Number of Occurrences:      Standing Expiration Date: 04/18/2015  . Hemoglobin A1c    Standing Status: Future     Number of Occurrences:      Standing Expiration Date: 04/18/2015  . Lipid panel    Standing Status: Future     Number of Occurrences:      Standing Expiration Date: 04/18/2015    Order Specific Question:  Has the patient fasted?    Answer:  Yes  . Basic metabolic panel    Standing Status: Future     Number of Occurrences:      Standing Expiration Date: 04/18/2015    Order Specific Question:  Has the patient fasted?    Answer:  Yes  . Hepatic Function Panel    Standing Status: Future     Number of Occurrences:      Standing Expiration Date: 04/18/2015    Next appt:  6 wks for annual exam with MMSE, labs before  Adaiah Morken L. Cornelius Schuitema, D.O. Geriatrics [facility] [address] [place] [place], [place] [address] Cell Phone (Mon-Fri 8am-5pm):  [phone] On Call:  [phone] & follow prompts after 5pm & weekends Office Phone:  [phone] Office Fax:  [phone]

## 2015-04-19 ENCOUNTER — Other Ambulatory Visit: Payer: Medicare Other

## 2015-04-19 ENCOUNTER — Other Ambulatory Visit: Payer: Self-pay | Admitting: *Deleted

## 2015-04-19 ENCOUNTER — Other Ambulatory Visit: Payer: Self-pay

## 2015-04-19 DIAGNOSIS — F0280 Dementia in other diseases classified elsewhere without behavioral disturbance: Secondary | ICD-10-CM

## 2015-04-19 DIAGNOSIS — R413 Other amnesia: Secondary | ICD-10-CM

## 2015-04-19 DIAGNOSIS — E785 Hyperlipidemia, unspecified: Secondary | ICD-10-CM

## 2015-04-19 DIAGNOSIS — I10 Essential (primary) hypertension: Secondary | ICD-10-CM

## 2015-04-19 DIAGNOSIS — G309 Alzheimer's disease, unspecified: Secondary | ICD-10-CM

## 2015-04-19 DIAGNOSIS — J4520 Mild intermittent asthma, uncomplicated: Secondary | ICD-10-CM | POA: Diagnosis not present

## 2015-04-19 DIAGNOSIS — F1010 Alcohol abuse, uncomplicated: Secondary | ICD-10-CM | POA: Diagnosis not present

## 2015-04-19 DIAGNOSIS — J45909 Unspecified asthma, uncomplicated: Secondary | ICD-10-CM

## 2015-04-20 LAB — CBC WITH DIFFERENTIAL/PLATELET
Basophils Absolute: 0 10*3/uL (ref 0.0–0.2)
Basos: 0 %
EOS (ABSOLUTE): 0.1 10*3/uL (ref 0.0–0.4)
Eos: 2 %
Hematocrit: 41.5 % (ref 34.0–46.6)
Hemoglobin: 14.2 g/dL (ref 11.1–15.9)
Immature Grans (Abs): 0 10*3/uL (ref 0.0–0.1)
Immature Granulocytes: 0 %
Lymphocytes Absolute: 1.8 10*3/uL (ref 0.7–3.1)
Lymphs: 45 %
MCH: 33.3 pg — ABNORMAL HIGH (ref 26.6–33.0)
MCHC: 34.2 g/dL (ref 31.5–35.7)
MCV: 97 fL (ref 79–97)
Monocytes Absolute: 0.5 10*3/uL (ref 0.1–0.9)
Monocytes: 12 %
Neutrophils Absolute: 1.6 10*3/uL (ref 1.4–7.0)
Neutrophils: 41 %
Platelets: 231 10*3/uL (ref 150–379)
RBC: 4.27 x10E6/uL (ref 3.77–5.28)
RDW: 13.5 % (ref 12.3–15.4)
WBC: 3.9 10*3/uL (ref 3.4–10.8)

## 2015-04-20 LAB — BASIC METABOLIC PANEL
BUN/Creatinine Ratio: 21 (ref 11–26)
BUN: 17 mg/dL (ref 8–27)
CO2: 24 mmol/L (ref 18–29)
Calcium: 9.5 mg/dL (ref 8.7–10.3)
Chloride: 92 mmol/L — ABNORMAL LOW (ref 97–108)
Creatinine, Ser: 0.81 mg/dL (ref 0.57–1.00)
GFR calc Af Amer: 81 mL/min/{1.73_m2} (ref >59)
GFR calc non Af Amer: 70 mL/min/{1.73_m2} (ref >59)
Glucose: 108 mg/dL — ABNORMAL HIGH (ref 65–99)
Potassium: 4.3 mmol/L (ref 3.5–5.2)
Sodium: 136 mmol/L (ref 134–144)

## 2015-04-20 LAB — HEMOGLOBIN A1C
Est. average glucose Bld gHb Est-mCnc: 137 mg/dL
Hgb A1c MFr Bld: 6.4 % — ABNORMAL HIGH (ref 4.8–5.6)

## 2015-04-20 LAB — LIPID PANEL
Chol/HDL Ratio: 2.3 ratio units (ref 0.0–4.4)
Cholesterol, Total: 190 mg/dL (ref 100–199)
HDL: 82 mg/dL (ref >39)
LDL Calculated: 83 mg/dL (ref 0–99)
Triglycerides: 125 mg/dL (ref 0–149)
VLDL Cholesterol Cal: 25 mg/dL (ref 5–40)

## 2015-04-21 ENCOUNTER — Encounter: Payer: Self-pay | Admitting: Internal Medicine

## 2015-04-21 ENCOUNTER — Ambulatory Visit (INDEPENDENT_AMBULATORY_CARE_PROVIDER_SITE_OTHER): Payer: Medicare Other | Admitting: Internal Medicine

## 2015-04-21 VITALS — BP 130/78 | HR 67 | Temp 97.6°F | Resp 16 | Ht 67.5 in | Wt 204.0 lb

## 2015-04-21 DIAGNOSIS — I10 Essential (primary) hypertension: Secondary | ICD-10-CM | POA: Diagnosis not present

## 2015-04-21 DIAGNOSIS — R739 Hyperglycemia, unspecified: Secondary | ICD-10-CM | POA: Diagnosis not present

## 2015-04-21 DIAGNOSIS — F1010 Alcohol abuse, uncomplicated: Secondary | ICD-10-CM

## 2015-04-21 DIAGNOSIS — G4700 Insomnia, unspecified: Secondary | ICD-10-CM

## 2015-04-21 DIAGNOSIS — J4520 Mild intermittent asthma, uncomplicated: Secondary | ICD-10-CM | POA: Diagnosis not present

## 2015-04-21 DIAGNOSIS — B372 Candidiasis of skin and nail: Secondary | ICD-10-CM

## 2015-04-21 DIAGNOSIS — G309 Alzheimer's disease, unspecified: Secondary | ICD-10-CM

## 2015-04-21 DIAGNOSIS — Z780 Asymptomatic menopausal state: Secondary | ICD-10-CM | POA: Diagnosis not present

## 2015-04-21 DIAGNOSIS — F0280 Dementia in other diseases classified elsewhere without behavioral disturbance: Secondary | ICD-10-CM

## 2015-04-21 DIAGNOSIS — E785 Hyperlipidemia, unspecified: Secondary | ICD-10-CM

## 2015-04-21 MED ORDER — NYSTATIN 100000 UNIT/GM EX POWD: CUTANEOUS | Status: DC

## 2015-04-21 NOTE — Progress Notes (Signed)
Patient ID: Amy Andrade, female   DOB: December 20, 1935, 79 y.o.   MRN: [id number]   Location:  [facility] / [facility]  Code Status: DNR Goals of Care: Advanced Directive information Does patient have an advance directive?: Yes, Type of Advance Directive: Healthcare Power of [place];Living will, Does patient want to make changes to advanced directive?: No - Patient declined   Chief Complaint  Patient presents with  . Annual Exam    Yearly check-up, discuss labs (copy printed), EKG, and MMSE (27/30-failed clock test)  . Referral    Bone Density     HPI: Patient is a 79 y.o. black female seen in the office today for her annual wellness exam.    She requests a bone density test.    Immunization History  Administered Date(s) Administered  . Pneumococcal Conjugate-13 10/11/2014  . Zoster 07/22/2014   Depression screen PHQ 2/9 04/21/2015 03/07/2015  Decreased Interest 0 0  Down, Depressed, Hopeless 0 0  PHQ - 2 Score 0 0   Fall Risk  04/21/2015 03/07/2015  Falls in the past year? No No   MMSE - Mini Mental State Exam 04/21/2015  Orientation to time 2  Orientation to Place 5  Registration 3  Attention/ Calculation 5  Recall 1  Language- name 2 objects 2  Language- repeat 1  Language- follow 3 step command 3  Language- read & follow direction 1  Write a sentence 1  Copy design 1  Total score 25   Functional status: independent in ADLs but has difficulty with appts, some finances.  Her son is intervening b/c her husband also has dementia. Pain:  Denies any pain. Physical activity:  Gets on exercise bike 3x per week for 30 mins.  Urinary incontinence: No difficulty making it to restroom.  No frequency, urgency, dysuria.   Says no concerns except that she's crazy.  No mood complaints.  Reviewed labs:  Lipids ok but hba1c elevated.  Doesn't eat much starchy food she says.  Cut back on sweets and starches.  Her son notes that she is eating the large  muffins/pastries from [employer].  Will eat more than one at a sitting sometimes  Alcohol:  Says she has a glass at lunch and at dinner.  Her son says she is still drinking before noon sometimes and too much.    Admits to yeast infection beneath breasts--just uses regular powder to keep it dry.  Discussed nystatin and prescribed today.  Review of Systems:  Review of Systems  Constitutional: Negative for fever, chills and malaise/fatigue.  HENT: Negative for congestion and hearing loss.   Eyes: Negative for blurred vision.       Needs her glasses; is seeing spots  Respiratory: Negative for cough and shortness of breath.   Cardiovascular: Negative for chest pain and leg swelling.  Gastrointestinal: Negative for abdominal pain, constipation, blood in stool and melena.  Genitourinary: Negative for dysuria, urgency and frequency.  Musculoskeletal: Negative for myalgias, joint pain and falls.  Skin: Negative for itching and rash.  Neurological: Negative for dizziness, tingling, sensory change, loss of consciousness, weakness and headaches.  Endo/Heme/Allergies: Does not bruise/bleed easily.  Psychiatric/Behavioral: Positive for memory loss. The patient has insomnia.        Does ok with melatonin on sleeping    Past Medical History  Diagnosis Date  . Asthma   . Hyperlipidemia   . Alzheimer disease   . Hypertension   . Pulmonary embolism 2004  was short of breath--was on coumadin for 6 mos to a year    Past Surgical History  Procedure Laterality Date  . Replacement total knee  1980s, 2003    x2  . Bunionectomy    . Abdominal hysterectomy      No Known Allergies Medications: Patient's Medications  New Prescriptions   NYSTATIN (MYCOSTATIN/NYSTOP) 100000 UNIT/GM POWD    One application beneath breasts daily after bathing  Previous Medications   ALBUTEROL (PROVENTIL HFA;VENTOLIN HFA) 108 (90 BASE) MCG/ACT INHALER    Inhale 2 puffs into the lungs every 6 (six) hours as needed  for wheezing or shortness of breath.   ASPIRIN 81 MG TABLET    Take 81 mg by mouth daily.     BUDESONIDE-FORMOTEROL FUMARATE (SYMBICORT IN)    Inhale into the lungs. Symbicort 120/4.5 1 puff 2 x a day   CYCLOSPORINE (RESTASIS) 0.05 % OPHTHALMIC EMULSION    Place 1 drop into both eyes daily as needed (conjunctivitis).    DONEPEZIL (ARICEPT) 10 MG TABLET    Take 10 mg by mouth daily.   MELATONIN 3 MG CAPS    Take 1 capsule (3 mg total) by mouth at bedtime as needed (insomnia).   MONTELUKAST (SINGULAIR) 10 MG TABLET    Take 10 mg by mouth at bedtime.     OLMESARTAN-AMLODIPINE-HCTZ (TRIBENZOR) 40-10-12.5 MG TABS    Take 1 tablet by mouth every morning.   PRAVASTATIN (PRAVACHOL) 40 MG TABLET    Take 40 mg by mouth daily.   PSEUDOEPHEDRINE-ACETAMINOPHEN (CEPACOL SORE THROAT PO)    Take by mouth. Take 1 tablet by mouth as needed  Modified Medications   No medications on file  Discontinued Medications   No medications on file    Physical Exam: Filed Vitals:   04/21/15 0916  BP: 130/78  Pulse: 67  Temp: 97.6 F (36.4 C)  TempSrc: Oral  Resp: 16  Height: 5' 7.5" (1.715 m)  Weight: 204 lb (92.534 kg)  SpO2: 95%   Physical Exam  Constitutional: She is oriented to person, place, and time. She appears well-developed and well-nourished. No distress.  HENT:  Head: Normocephalic and atraumatic.  Right Ear: External ear normal.  Left Ear: External ear normal.  Nose: Nose normal.  Mouth/Throat: Oropharynx is clear and moist. No oropharyngeal exudate.  Left ear with increased cerumen, but not impacted  Eyes: Conjunctivae and EOM are normal. Pupils are equal, round, and reactive to light.  Neck: Normal range of motion. Neck supple. No JVD present. No tracheal deviation present. No thyromegaly present.  Cardiovascular: Normal rate, regular rhythm, normal heart sounds and intact distal pulses.   Pulmonary/Chest: Effort normal and breath sounds normal. No respiratory distress. Right breast exhibits  skin change. Right breast exhibits no inverted nipple, no mass, no nipple discharge and no tenderness. Left breast exhibits skin change. Left breast exhibits no inverted nipple, no mass, no nipple discharge and no tenderness.  Rash beneath breasts  Abdominal: Soft. Bowel sounds are normal. She exhibits no distension. There is no tenderness.  Musculoskeletal: Normal range of motion. She exhibits no edema or tenderness.  Neurological: She is alert and oriented to person, place, and time. She has normal reflexes. No cranial nerve deficit.  Skin: Skin is warm. Rash noted. There is erythema.  Beneath bilateral breasts, slightly moist  Psychiatric: She has a normal mood and affect. Her behavior is normal.    Labs reviewed: Basic Metabolic Panel:  Recent Labs  45/40/98 1015  NA 136  K 4.3  CL 92*  CO2 24  GLUCOSE 108*  BUN 17  CREATININE 0.81  CALCIUM 9.5   Liver Function Tests: No results for input(s): AST, ALT, ALKPHOS, BILITOT, PROT, ALBUMIN in the last 8760 hours. No results for input(s): LIPASE, AMYLASE in the last 8760 hours. No results for input(s): AMMONIA in the last 8760 hours. CBC:  Recent Labs  04/19/15 1015  WBC 3.9  NEUTROABS 1.6  HCT 41.5   Lipid Panel:  Recent Labs  04/19/15 1015  CHOL 190  HDL 82  LDLCALC 83  TRIG 125  CHOLHDL 2.3   Lab Results  Component Value Date   HGBA1C 6.4* 04/19/2015   No longer getting mammograms--last normal in 2012.  Assessment/Plan See preventive care above 1. Essential hypertension -bp is well controlled with tribenzor -avoid adding salt and eating items with a lot of preservatives - EKG 12-Lead was performed today and wnl HR 63 sinus rhythm, no signs of ischemia or infarct - Basic metabolic panel; Future  2. Alzheimer disease -cont aricept for this -scored 25/30 on mmse, but has functional difficulties and very poor short term memory not helped by alcohol intake -would like to add namenda XR and then transition  to namzaric  -will plan on this next visit now that Clovis Riley is coming along and can follow through with the plans  3. Asthma, mild intermittent, uncomplicated -currently asymptomatic -cont symbicort, singulair, and rarely needs her albuterol rescue inhaler  4. Hyperlipidemia -cont pravachol 40mg  with her evening meal -discussed dietary changes to lower LDL a bit more and decrease glucose  5. Alcohol abuse -encouraged her to cut back to one glass per day with her evening meal, but she claims not to have more than this -unclear how much of her memory loss is due to her alcohol intake and whether some of her behavior/memory problem is confabulation--should become clearer as I see her more often  6. Insomnia -cont melatonin 3mg  at bedtime which has been effective -also I have discussed that alcohol intake can affect sleep cycles and cause middle of the night waking and difficulty returning to sleep  7. Hyperglycemia - cut down on the big [employer] muffins and alcohol - Hemoglobin A1c; Future - Basic metabolic panel; Future  8. Postmenopausal estrogen deficiency -needs bone density test--order placed  9. Candidal skin infection - advised to use prescription powder rather than just regular powder to treat this and keep the area clean and as dry as possible - nystatin (MYCOSTATIN/NYSTOP) 100000 UNIT/GM POWD; One application beneath breasts daily after bathing  Dispense: 60 g; Refill: 1   Labs/tests ordered: Orders Placed This Encounter  Procedures  . Hemoglobin A1c    Standing Status: Future     Number of Occurrences:      Standing Expiration Date: 04/20/2016  . Basic metabolic panel    Standing Status: Future     Number of Occurrences:      Standing Expiration Date: 04/20/2016  . EKG 12-Lead    Next appt:  6 mos for med mgt  Neddie Steedman L. Arryanna Holquin, D.O. Geriatrics [facility] [address], [place] [address] Cell Phone (Mon-Fri  8am-5pm):  [phone] On Call:  [phone] & follow prompts after 5pm & weekends Office Phone:  [phone] Office Fax:  [phone]

## 2015-04-21 NOTE — Progress Notes (Signed)
Failed clock drawing  

## 2015-04-28 ENCOUNTER — Other Ambulatory Visit: Payer: Self-pay

## 2015-04-28 ENCOUNTER — Other Ambulatory Visit: Payer: Self-pay | Admitting: Internal Medicine

## 2015-04-28 DIAGNOSIS — E2839 Other primary ovarian failure: Secondary | ICD-10-CM

## 2015-05-10 DIAGNOSIS — H2513 Age-related nuclear cataract, bilateral: Secondary | ICD-10-CM | POA: Diagnosis not present

## 2015-05-10 DIAGNOSIS — H43813 Vitreous degeneration, bilateral: Secondary | ICD-10-CM | POA: Diagnosis not present

## 2015-05-10 DIAGNOSIS — H47329 Drusen of optic disc, unspecified eye: Secondary | ICD-10-CM | POA: Diagnosis not present

## 2015-05-26 ENCOUNTER — Telehealth: Payer: Self-pay

## 2015-05-26 MED ORDER — TETANUS-DIPHTH-ACELL PERTUSSIS 5-2.5-18.5 LF-MCG/0.5 IM SUSP: 0.5000 mL | Freq: Once | INTRAMUSCULAR | Status: DC

## 2015-05-26 NOTE — Telephone Encounter (Signed)
Patient will come in tomorrow with husband and would like rx for any vaccine due not covered in office.

## 2015-05-27 ENCOUNTER — Ambulatory Visit (INDEPENDENT_AMBULATORY_CARE_PROVIDER_SITE_OTHER): Payer: Medicare Other

## 2015-05-27 DIAGNOSIS — Z23 Encounter for immunization: Secondary | ICD-10-CM

## 2015-06-17 ENCOUNTER — Encounter: Payer: Self-pay | Admitting: Internal Medicine

## 2015-07-14 ENCOUNTER — Telehealth: Payer: Self-pay | Admitting: Internal Medicine

## 2015-07-14 NOTE — Telephone Encounter (Signed)
Please call her son.  She and her husband both have memory loss.  I thought their records had been modified to show their son as the contact--if not, please make that change.

## 2015-07-14 NOTE — Telephone Encounter (Signed)
FYI - Patient not returning calls from our office or [facility] to schedule - Mailed a letter to patient 06/17/15 about scheduling with no response      CC'd Synetta FailAnita for data entry

## 2015-07-15 NOTE — Telephone Encounter (Signed)
I believe the sons # is the one we have been contacting. I will double check

## 2015-07-22 ENCOUNTER — Other Ambulatory Visit: Payer: Self-pay | Admitting: *Deleted

## 2015-07-22 DIAGNOSIS — J4520 Mild intermittent asthma, uncomplicated: Secondary | ICD-10-CM

## 2015-07-22 MED ORDER — ALBUTEROL SULFATE HFA 108 (90 BASE) MCG/ACT IN AERS: 2.0000 | INHALATION_SPRAY | Freq: Four times a day (QID) | RESPIRATORY_TRACT | Status: DC | PRN

## 2015-07-22 NOTE — Telephone Encounter (Signed)
[facility] 

## 2015-09-26 ENCOUNTER — Encounter: Payer: Self-pay | Admitting: Internal Medicine

## 2015-10-08 DIAGNOSIS — I10 Essential (primary) hypertension: Secondary | ICD-10-CM | POA: Diagnosis not present

## 2015-10-24 ENCOUNTER — Other Ambulatory Visit: Payer: Medicare Other

## 2015-10-25 ENCOUNTER — Other Ambulatory Visit: Payer: Medicare Other

## 2015-10-26 ENCOUNTER — Other Ambulatory Visit: Payer: Medicare Other

## 2015-10-27 ENCOUNTER — Encounter: Payer: Self-pay | Admitting: Internal Medicine

## 2015-10-27 ENCOUNTER — Ambulatory Visit: Payer: Medicare Other | Admitting: Internal Medicine

## 2015-10-27 DIAGNOSIS — Z0289 Encounter for other administrative examinations: Secondary | ICD-10-CM

## 2015-11-03 ENCOUNTER — Ambulatory Visit: Payer: Medicare Other | Admitting: Internal Medicine

## 2016-01-09 ENCOUNTER — Ambulatory Visit: Payer: Medicare Other | Admitting: Internal Medicine

## 2016-01-12 ENCOUNTER — Other Ambulatory Visit: Payer: Self-pay | Admitting: *Deleted

## 2016-01-12 MED ORDER — OLMESARTAN-AMLODIPINE-HCTZ 40-10-12.5 MG PO TABS: 1.0000 | ORAL_TABLET | Freq: Every morning | ORAL | Status: DC

## 2016-01-12 NOTE — Telephone Encounter (Signed)
Patient son, Clovis RileyMitchell called and requested a refill to get her through to her appointment time 5/5. #10 tablets faxed to pharmacy and son stated he will make sure she keeps her appointment.

## 2016-01-16 ENCOUNTER — Other Ambulatory Visit: Payer: Self-pay | Admitting: *Deleted

## 2016-01-20 ENCOUNTER — Encounter: Payer: Self-pay | Admitting: Internal Medicine

## 2016-01-20 ENCOUNTER — Ambulatory Visit (INDEPENDENT_AMBULATORY_CARE_PROVIDER_SITE_OTHER): Payer: Medicare Other | Admitting: Internal Medicine

## 2016-01-20 VITALS — BP 140/80 | HR 66 | Temp 97.8°F | Ht 68.0 in | Wt 184.0 lb

## 2016-01-20 DIAGNOSIS — M545 Low back pain, unspecified: Secondary | ICD-10-CM

## 2016-01-20 DIAGNOSIS — F0280 Dementia in other diseases classified elsewhere without behavioral disturbance: Secondary | ICD-10-CM

## 2016-01-20 DIAGNOSIS — Z23 Encounter for immunization: Secondary | ICD-10-CM | POA: Diagnosis not present

## 2016-01-20 DIAGNOSIS — F1010 Alcohol abuse, uncomplicated: Secondary | ICD-10-CM | POA: Diagnosis not present

## 2016-01-20 DIAGNOSIS — G309 Alzheimer's disease, unspecified: Secondary | ICD-10-CM | POA: Diagnosis not present

## 2016-01-20 DIAGNOSIS — R739 Hyperglycemia, unspecified: Secondary | ICD-10-CM

## 2016-01-20 DIAGNOSIS — E785 Hyperlipidemia, unspecified: Secondary | ICD-10-CM

## 2016-01-20 DIAGNOSIS — J4520 Mild intermittent asthma, uncomplicated: Secondary | ICD-10-CM

## 2016-01-20 DIAGNOSIS — G4700 Insomnia, unspecified: Secondary | ICD-10-CM | POA: Diagnosis not present

## 2016-01-20 MED ORDER — OLMESARTAN-AMLODIPINE-HCTZ 40-10-12.5 MG PO TABS: 1.0000 | ORAL_TABLET | Freq: Every morning | ORAL | Status: DC

## 2016-01-20 NOTE — Progress Notes (Signed)
Patient ID: Amy Andrade, female   DOB: 10/05/1935, 80 y.o.   MRN: [id number]   Location:  [facility] [facility] Provider:  Urbano Milhouse L. Renato Gailseed, D.O., C.M.D.  Code Status: DNR Goals of Care:  Advanced Directives 01/20/2016  Does patient have an advance directive? Yes  Type of Advance Directive Healthcare Power of Attorney  Does patient want to make changes to advanced directive? -  Copy of advanced directive(s) in chart? Yes     Chief Complaint  Patient presents with  . Medical Management of Chronic Issues    follow-up, medication refill, son Clovis RileyMitchell    HPI: Patient is a 80 y.o. female seen today for medical management of chronic diseases.  She has AD, HL, asthma, htn, PE.    Fell last night.  Says she must have fallen.  That is the second fall in 4 days.    Went to Dr. Tedra SenegalBland's instead of here for one of her appts.  Back is in constant pain per Clovis RileyMitchell, her son.  Can barely stand at time.  Will be sob when she hurts.  Does admit she always hurts "a little".  Took him 20 mins to get up her from sitting on the floor after falling.  Was barefoot and feet sliding.  Story sounds the same for both falls so unclear if it's the same fall she is remembering.    [name] plans to start helping her with her meds and getting them ready.  She has them in a basket and one side is evening and one side is morning, but she wasn't sure which side was which.  She has multiple bottles of singulair.    She is not taking melatonin b/c she is taking tylenol pm which I didn't want her to take.    Is on her aricept for memory.  He feels like it's gotten a little worse.   No wheezing.    Bowels moving well.    No urinary incontinence.    Seeing well, no complaints.  Last eye appt--saw Dr. Harlon FlorWhitaker.  She reports she is drinking one at breakfast and one at dinner.  He says she is drinking more than that.  She says only with meals.  1 5L box in a week and a half.  Past Medical History  Diagnosis Date  . Asthma     . Hyperlipidemia   . Alzheimer disease   . Hypertension   . Pulmonary embolism 2004    was short of breath--was on coumadin for 6 mos to a year    Past Surgical History  Procedure Laterality Date  . Replacement total knee  1980s, 2003    x2  . Bunionectomy    . Abdominal hysterectomy      No Known Allergies    Medication List       This list is accurate as of: 01/20/16 10:58 AM.  Always use your most recent med list.               albuterol 108 (90 Base) MCG/ACT inhaler  Commonly known as:  PROVENTIL HFA;VENTOLIN HFA  Inhale 2 puffs into the lungs every 6 (six) hours as needed for wheezing or shortness of breath.     aspirin 81 MG tablet  Take 81 mg by mouth daily.     cycloSPORINE 0.05 % ophthalmic emulsion  Commonly known as:  RESTASIS  Place 1 drop into both eyes daily as needed (conjunctivitis).     donepezil 10 MG tablet  Commonly known  as:  ARICEPT  Take 10 mg by mouth daily.     montelukast 10 MG tablet  Commonly known as:  SINGULAIR  Take 10 mg by mouth at bedtime.     Olmesartan-Amlodipine-HCTZ 40-10-12.5 MG Tabs  Commonly known as:  TRIBENZOR  Take 1 tablet by mouth every morning.     pravastatin 40 MG tablet  Commonly known as:  PRAVACHOL  Take 40 mg by mouth daily.     SYMBICORT IN  Inhale into the lungs. Symbicort 120/4.5 1 puff 2 x a day       Review of Systems:  Review of Systems  Constitutional: Negative for fever, chills and malaise/fatigue.  HENT: Negative for congestion and hearing loss.   Eyes: Negative for blurred vision.  Respiratory: Negative for cough, shortness of breath and wheezing.   Cardiovascular: Negative for chest pain, palpitations and leg swelling.  Gastrointestinal: Negative for abdominal pain, constipation, blood in stool and melena.  Genitourinary: Negative for dysuria, urgency and frequency.  Musculoskeletal: Positive for back pain. Negative for myalgias and falls.  Skin: Negative for rash.  Neurological:  Negative for dizziness, loss of consciousness, weakness and headaches.  Psychiatric/Behavioral: Positive for memory loss. Negative for depression. The patient does not have insomnia.     Health Maintenance  Topic Date Due  . TETANUS/TDAP  10/22/1956  . DEXA SCAN  10/22/2002  . PNA vac Low Risk Adult (2 of 2 - PPSV23) 10/12/2015  . INFLUENZA VACCINE  04/17/2016  . ZOSTAVAX  Completed   Physical Exam: Filed Vitals:   01/20/16 1049  BP: 140/80  Pulse: 66  Temp: 97.8 F (36.6 C)  TempSrc: Oral  Height:  (1.727 m)  Weight: 184 lb (83.462 kg)  SpO2: 97%   Body mass index is 27.98 kg/(m^2). Physical Exam  Constitutional: She appears well-developed and well-nourished. No distress.  Cardiovascular: Normal rate, regular rhythm, normal heart sounds and intact distal pulses.   Pulmonary/Chest: Effort normal and breath sounds normal. No respiratory distress.  Abdominal: Soft. Bowel sounds are normal. She exhibits no distension. There is no tenderness.  Musculoskeletal: Normal range of motion.  Grabs her back as she stands up  Neurological: She is alert. No cranial nerve deficit.  Oriented to person and place; short term memory loss during visit  Skin: Skin is warm and dry.  Psychiatric: She has a normal mood and affect.    Labs reviewed: Basic Metabolic Panel:  Recent Labs  56/21/30 1015  NA 136  K 4.3  CL 92*  CO2 24  GLUCOSE 108*  BUN 17  CREATININE 0.81  CALCIUM 9.5   Liver Function Tests: No results for input(s): AST, ALT, ALKPHOS, BILITOT, PROT, ALBUMIN in the last 8760 hours. No results for input(s): LIPASE, AMYLASE in the last 8760 hours. No results for input(s): AMMONIA in the last 8760 hours. CBC:  Recent Labs  04/19/15 1015  WBC 3.9  NEUTROABS 1.6  HCT 41.5  MCV 97  PLT 231   Lipid Panel:  Recent Labs  04/19/15 1015  CHOL 190  HDL 82  LDLCALC 83  TRIG 125  CHOLHDL 2.3   Lab Results  Component Value Date   HGBA1C 6.4* 04/19/2015    Assessment/Plan 1. Alzheimer disease -is progressing -continues on aricept -might add namenda next time if her insurance will cover the namzaric for her -her son came along today and apparently realized how bad things are now and plans to help with her pills also (is helping with dad's)  2. Asthma, mild intermittent, uncomplicated -continues with singulair which she had multiple bottles of in her pill container--son going to check on this -cont prn albuterol and symbicort  3. Hyperlipidemia -cont pravachol for her  4. Alcohol abuse -is drinking at least two glasses of wine per day, oddly even with her eggs at breakfast -counseled previously on cutting back  5. Hyperglycemia -reassess hba1c today which she was meant to have before her feb visit and did not  6. Insomnia -again advised to stop tylenol pm which is not helping her cognition, and use melatonin which she did buy but hasn't been using  7. Midline low back pain without sciatica -son reports this is worsening, but pt denies it bothering her very much despite grunting and groaning and grabbing her back when standing  -agrees to try salonpas lidocaine patches (as prescription ones will not be covered for this dx)  8. Need for pneumococcal vaccine - Pneumococcal polysaccharide vaccine 23-valent greater than or equal to 2yo subcutaneous/IM (pneumovax given today)  Labs/tests ordered:  Was to get hba1c and bmp ordered for feb today due to missed appt x 2  Next appt:  04/26/2016 annual exam  Millenia Waldvogel L. Mersadies Petree, D.O. Geriatrics [facility] [address], [place] [address] Cell Phone (Mon-Fri 8am-5pm):  [phone] On Call:  [phone] & follow prompts after 5pm & weekends Office Phone:  [phone] Office Fax:  [phone]

## 2016-01-20 NOTE — Patient Instructions (Addendum)
Stop tylenol PM.  Use the melatonin. Try salonpas with lidocaine from the pharmacy for her back.

## 2016-01-30 ENCOUNTER — Other Ambulatory Visit: Payer: Self-pay | Admitting: *Deleted

## 2016-01-30 MED ORDER — PRAVASTATIN SODIUM 40 MG PO TABS: 40.0000 mg | ORAL_TABLET | Freq: Every day | ORAL | Status: DC

## 2016-01-30 NOTE — Telephone Encounter (Signed)
[facility] 

## 2016-03-28 ENCOUNTER — Other Ambulatory Visit: Payer: Self-pay

## 2016-03-28 MED ORDER — PRAVASTATIN SODIUM 40 MG PO TABS: 40.0000 mg | ORAL_TABLET | Freq: Every day | ORAL | Status: DC

## 2016-03-28 NOTE — Telephone Encounter (Signed)
Received refill request from [facility] -patient need 90 day supply.

## 2016-04-26 ENCOUNTER — Encounter: Payer: Self-pay | Admitting: Internal Medicine

## 2016-05-11 ENCOUNTER — Encounter: Payer: Self-pay | Admitting: Internal Medicine

## 2016-05-30 ENCOUNTER — Telehealth: Payer: Self-pay | Admitting: *Deleted

## 2016-05-30 ENCOUNTER — Encounter (HOSPITAL_COMMUNITY): Payer: Self-pay | Admitting: *Deleted

## 2016-05-30 ENCOUNTER — Observation Stay (HOSPITAL_COMMUNITY)
Admission: EM | Admit: 2016-05-30 | Discharge: 2016-05-31 | Disposition: A | Discharge status: Home / Self Care | Payer: Medicare Other | Attending: Internal Medicine | Admitting: Internal Medicine

## 2016-05-30 ENCOUNTER — Emergency Department (HOSPITAL_COMMUNITY): Payer: Medicare Other

## 2016-05-30 DIAGNOSIS — E871 Hypo-osmolality and hyponatremia: Secondary | ICD-10-CM | POA: Diagnosis not present

## 2016-05-30 DIAGNOSIS — J45909 Unspecified asthma, uncomplicated: Secondary | ICD-10-CM | POA: Diagnosis not present

## 2016-05-30 DIAGNOSIS — R0789 Other chest pain: Secondary | ICD-10-CM | POA: Diagnosis not present

## 2016-05-30 DIAGNOSIS — F1010 Alcohol abuse, uncomplicated: Secondary | ICD-10-CM | POA: Diagnosis present

## 2016-05-30 DIAGNOSIS — R079 Chest pain, unspecified: Secondary | ICD-10-CM

## 2016-05-30 DIAGNOSIS — R911 Solitary pulmonary nodule: Secondary | ICD-10-CM | POA: Diagnosis present

## 2016-05-30 DIAGNOSIS — I10 Essential (primary) hypertension: Secondary | ICD-10-CM | POA: Diagnosis not present

## 2016-05-30 DIAGNOSIS — J4530 Mild persistent asthma, uncomplicated: Secondary | ICD-10-CM | POA: Diagnosis not present

## 2016-05-30 DIAGNOSIS — Z86711 Personal history of pulmonary embolism: Secondary | ICD-10-CM | POA: Diagnosis present

## 2016-05-30 DIAGNOSIS — Z87891 Personal history of nicotine dependence: Secondary | ICD-10-CM | POA: Insufficient documentation

## 2016-05-30 DIAGNOSIS — Z7982 Long term (current) use of aspirin: Secondary | ICD-10-CM | POA: Diagnosis not present

## 2016-05-30 DIAGNOSIS — E785 Hyperlipidemia, unspecified: Secondary | ICD-10-CM | POA: Diagnosis present

## 2016-05-30 LAB — CBC
HEMATOCRIT: 42.5 % (ref 36.0–46.0)
HEMOGLOBIN: 14.3 g/dL (ref 12.0–15.0)
MCH: 33.7 pg (ref 26.0–34.0)
MCHC: 33.6 g/dL (ref 30.0–36.0)
MCV: 100.2 fL — ABNORMAL HIGH (ref 78.0–100.0)
Platelets: 228 10*3/uL (ref 150–400)
RBC: 4.24 MIL/uL (ref 3.87–5.11)
RDW: 12.6 % (ref 11.5–15.5)
WBC: 5.2 10*3/uL (ref 4.0–10.5)

## 2016-05-30 LAB — BASIC METABOLIC PANEL
ANION GAP: 14 (ref 5–15)
BUN: 13 mg/dL (ref 6–20)
CO2: 22 mmol/L (ref 22–32)
Calcium: 10 mg/dL (ref 8.9–10.3)
Chloride: 95 mmol/L — ABNORMAL LOW (ref 101–111)
Creatinine, Ser: 0.94 mg/dL (ref 0.44–1.00)
GFR, EST NON AFRICAN AMERICAN: 57 mL/min — AB (ref >60)
Glucose, Bld: 108 mg/dL — ABNORMAL HIGH (ref 65–99)
POTASSIUM: 4.5 mmol/L (ref 3.5–5.1)
SODIUM: 131 mmol/L — AB (ref 135–145)

## 2016-05-30 LAB — I-STAT TROPONIN, ED: TROPONIN I, POC: 0 ng/mL (ref 0.00–0.08)

## 2016-05-30 MED ORDER — CYCLOSPORINE 0.05 % OP EMUL: 1.0000 [drp] | Freq: Every day | OPHTHALMIC | Status: DC | PRN

## 2016-05-30 MED ORDER — ACETAMINOPHEN 325 MG PO TABS: 650.0000 mg | ORAL_TABLET | Freq: Four times a day (QID) | ORAL | Status: DC | PRN

## 2016-05-30 MED ORDER — ZOLPIDEM TARTRATE 5 MG PO TABS
5.0000 mg | ORAL_TABLET | Freq: Every evening | ORAL | Status: DC | PRN
  Administered 2016-05-31: 5 mg via ORAL
  Filled 2016-05-30: qty 1

## 2016-05-30 MED ORDER — HYDROCODONE-ACETAMINOPHEN 5-325 MG PO TABS: 1.0000 | ORAL_TABLET | ORAL | Status: DC | PRN

## 2016-05-30 MED ORDER — NITROGLYCERIN 0.4 MG SL SUBL: 0.4000 mg | SUBLINGUAL_TABLET | SUBLINGUAL | Status: DC | PRN

## 2016-05-30 MED ORDER — BUDESONIDE-FORMOTEROL FUMARATE 160-4.5 MCG/ACT IN AERO
1.0000 | INHALATION_SPRAY | Freq: Two times a day (BID) | RESPIRATORY_TRACT | Status: DC
  Filled 2016-05-30: qty 6

## 2016-05-30 MED ORDER — SODIUM CHLORIDE 0.9% FLUSH: 3.0000 mL | INTRAVENOUS | Status: DC | PRN

## 2016-05-30 MED ORDER — ASPIRIN 81 MG PO CHEW
324.0000 mg | CHEWABLE_TABLET | Freq: Once | ORAL | Status: AC
  Administered 2016-05-30: 324 mg via ORAL
  Filled 2016-05-30: qty 4

## 2016-05-30 MED ORDER — SODIUM CHLORIDE 0.9 % IV SOLN: 250.0000 mL | INTRAVENOUS | Status: DC | PRN

## 2016-05-30 MED ORDER — MONTELUKAST SODIUM 10 MG PO TABS
10.0000 mg | ORAL_TABLET | Freq: Every day | ORAL | Status: DC
  Administered 2016-05-31: 10 mg via ORAL
  Filled 2016-05-30: qty 1

## 2016-05-30 MED ORDER — ALBUTEROL SULFATE (2.5 MG/3ML) 0.083% IN NEBU: 2.5000 mg | INHALATION_SOLUTION | RESPIRATORY_TRACT | Status: DC | PRN

## 2016-05-30 MED ORDER — DONEPEZIL HCL 10 MG PO TABS
10.0000 mg | ORAL_TABLET | Freq: Every day | ORAL | Status: DC
  Administered 2016-05-31: 10 mg via ORAL
  Filled 2016-05-30: qty 1

## 2016-05-30 MED ORDER — METOPROLOL TARTRATE 12.5 MG HALF TABLET
12.5000 mg | ORAL_TABLET | Freq: Two times a day (BID) | ORAL | Status: DC
  Administered 2016-05-31: 12.5 mg via ORAL
  Filled 2016-05-30: qty 1

## 2016-05-30 MED ORDER — PRAVASTATIN SODIUM 40 MG PO TABS: 40.0000 mg | ORAL_TABLET | Freq: Every day | ORAL | Status: DC

## 2016-05-30 MED ORDER — SODIUM CHLORIDE 0.9% FLUSH
3.0000 mL | Freq: Two times a day (BID) | INTRAVENOUS | Status: DC
  Administered 2016-05-31: 3 mL via INTRAVENOUS

## 2016-05-30 MED ORDER — ONDANSETRON HCL 4 MG/2ML IJ SOLN: 4.0000 mg | Freq: Four times a day (QID) | INTRAMUSCULAR | Status: DC | PRN

## 2016-05-30 MED ORDER — ENOXAPARIN SODIUM 40 MG/0.4ML ~~LOC~~ SOLN
40.0000 mg | SUBCUTANEOUS | Status: DC
  Administered 2016-05-31: 40 mg via SUBCUTANEOUS
  Filled 2016-05-30: qty 0.4

## 2016-05-30 MED ORDER — ACETAMINOPHEN 325 MG PO TABS: 650.0000 mg | ORAL_TABLET | ORAL | Status: DC | PRN

## 2016-05-30 MED ORDER — ASPIRIN 81 MG PO CHEW
81.0000 mg | CHEWABLE_TABLET | Freq: Every day | ORAL | Status: DC
  Administered 2016-05-31: 81 mg via ORAL
  Filled 2016-05-30: qty 1

## 2016-05-30 NOTE — ED Provider Notes (Signed)
[facility]-EMERGENCY DEPT Provider Note   CSN: [id number] Arrival date & time: 05/30/16  1531     History   Chief Complaint Chief Complaint  Patient presents with  . Chest Pain    HPI Amy Andrade is a 80 y.o. female.  The history is provided by the patient and a relative.  Chest Pain   This is a new problem. The current episode started 6 to 12 hours ago. The problem occurs constantly. The problem has not changed since onset.Quality: tightness. The pain does not radiate. Associated symptoms include shortness of breath. Pertinent negatives include no diaphoresis, no syncope and no vomiting. She has tried nothing for the symptoms. Risk factors include being elderly.  Her past medical history is significant for hypertension and PE.  Pertinent negatives for past medical history include no CAD.   Pt presents with chest tightness She reports this started earlier today and has been worsening She also reports shortness of breath No pleuritic pain (she has previous h/o PE)  She has not had this pain recently She reports her husband recently died but today was the first day of ehr pain Past Medical History:  Diagnosis Date  . Alzheimer disease   . Asthma   . Hyperlipidemia   . Hypertension   . Pulmonary embolism (HCC) 2004   was short of breath--was on coumadin for 6 mos to a year    Patient Active Problem List   Diagnosis Date Noted  . Alcohol abuse 03/07/2015  . Memory loss 03/07/2015  . Alzheimer disease   . Asthma   . Hyperlipidemia   . Hypertension   . Skin lesion of chest wall 06/22/2011    Past Surgical History:  Procedure Laterality Date  . ABDOMINAL HYSTERECTOMY    . BUNIONECTOMY    . REPLACEMENT TOTAL KNEE  1980s, 2003   x2    OB History    No data available       Home Medications    Prior to Admission medications   Medication Sig Start Date End Date Taking? Authorizing Provider  albuterol (PROVENTIL HFA;VENTOLIN HFA) 108 (90 BASE) MCG/ACT inhaler  Inhale 2 puffs into the lungs every 6 (six) hours as needed for wheezing or shortness of breath. 07/22/15  Yes Tiffany L Reed, DO  aspirin 81 MG tablet Take 81 mg by mouth daily.     Yes Historical Provider, MD  Budesonide-Formoterol Fumarate (SYMBICORT IN) Inhale into the lungs. Symbicort 120/4.5 1 puff 2 x a day   Yes Historical Provider, MD  cycloSPORINE (RESTASIS) 0.05 % ophthalmic emulsion Place 1 drop into both eyes daily as needed (conjunctivitis).    Yes Historical Provider, MD  donepezil (ARICEPT) 10 MG tablet Take 10 mg by mouth daily.   Yes Historical Provider, MD  montelukast (SINGULAIR) 10 MG tablet Take 10 mg by mouth every morning.    Yes Historical Provider, MD  Olmesartan-Amlodipine-HCTZ (TRIBENZOR) 40-10-12.5 MG TABS Take 1 tablet by mouth every morning. 01/20/16  Yes Tiffany L Reed, DO  pravastatin (PRAVACHOL) 40 MG tablet Take 1 tablet (40 mg total) by mouth daily. 03/28/16  Yes Tiffany Alroy DustL Reed, DO    Family History Family History  Problem Relation Age of Onset  . Alzheimer's disease Mother   . Chronic bronchitis Mother   . Heart disease Father   . High blood pressure Father   . High blood pressure Brother   . Asthma Daughter   . High blood pressure Son   . High Cholesterol Son   .  Post-traumatic stress disorder Son     Social History Social History  Substance Use Topics  . Smoking status: Former Smoker    Packs/day: 0.25    Years: 4.00    Types: Cigarettes    Quit date: 09/17/1957  . Smokeless tobacco: Never Used  . Alcohol use 25.2 oz/week    42 Glasses of wine per week     Allergies   Review of patient's allergies indicates no known allergies.   Review of Systems Review of Systems  Constitutional: Positive for fatigue. Negative for diaphoresis.  Respiratory: Positive for shortness of breath.   Cardiovascular: Positive for chest pain. Negative for syncope.  Gastrointestinal: Negative for vomiting.  All other systems reviewed and are  negative.    Physical Exam Updated Vital Signs BP 150/66   Pulse 78   Temp 98.1 F (36.7 C) (Oral)   Resp 25   Wt 90.7 kg   SpO2 91%   BMI 30.41 kg/m   Physical Exam CONSTITUTIONAL: Well developed/well nourished HEAD: Normocephalic/atraumatic EYES: EOMI/PERRL ENMT: Mucous membranes moist NECK: supple no meningeal signs SPINE/BACK:entire spine nontender CV: S1/S2 noted, no murmurs/rubs/gallops noted LUNGS: Lungs are clear to auscultation bilaterally, no apparent distress ABDOMEN: soft, nontender NEURO: Pt is awake/alert/appropriate, moves all extremitiesx4.  No facial droop.   EXTREMITIES: pulses normal/equal, full ROM, no calf tenderness or edema SKIN: warm, color normal PSYCH: no abnormalities of mood noted, alert and oriented to situation   ED Treatments / Results  Labs (all labs ordered are listed, but only abnormal results are displayed) Labs Reviewed  BASIC METABOLIC PANEL - Abnormal; Notable for the following:       Result Value   Sodium 131 (*)    Chloride 95 (*)    Glucose, Bld 108 (*)    GFR calc non Af Amer 57 (*)    All other components within normal limits  CBC - Abnormal; Notable for the following:    MCV 100.2 (*)    All other components within normal limits  I-STAT TROPOININ, ED    EKG  EKG Interpretation  Date/Time:  Wednesday May 30 2016 15:37:30 EDT Ventricular Rate:  73 PR Interval:  154 QRS Duration: 74 QT Interval:  372 QTC Calculation: 409 R Axis:   21 Text Interpretation:  Normal sinus rhythm Right atrial enlargement Cannot rule out Anterior infarct , age undetermined Abnormal ECG No significant change since last tracing Confirmed by Bebe Shaggy  MD, Julianne Chamberlin ([id number]) on 05/30/2016 9:19:19 PM       Radiology Dg Chest 2 View  Result Date: 05/30/2016 CLINICAL DATA:  Heaviness in chest with pain EXAM: CHEST  2 VIEW COMPARISON:  10/29/2007 FINDINGS: Normal heart size. No pleural effusion or edema. There is no airspace opacities  identified. Subtle asymmetric nodular density in the right upper lung zone is identified which measures approximately 1.5 cm. The visualized osseous structures are unremarkable. IMPRESSION: 1. No evidence for pneumonia. 2. Subtle nodular density within the right upper lung zone is identified. Suggest further investigation with nonemergent, noncontrast CT of the chest. Electronically Signed   By: Signa Kell M.D.   On: 05/30/2016 16:39    Procedures Procedures   Medications Ordered in ED Medications  nitroGLYCERIN (NITROSTAT) SL tablet 0.4 mg (not administered)  aspirin chewable tablet 324 mg (324 mg Oral Given 05/30/16 2205)     Initial Impression / Assessment and Plan / ED Course  I have reviewed the triage vital signs and the nursing notes.  Pertinent labs & imaging  results that were available during my care of the patient were reviewed by me and considered in my medical decision making (see chart for details).  Clinical Course    Pt stable Her CP is improving with ASA Due to age/risk factors advised admission for chest pain r/o MI Pt/family agreeable Also informed patient and her son of nodular density on CXR and need for outpatient CT chest - she has PCP f/u next month  I doubt PE (no pleuritic pain) I have low suspicion for Dissection at this time 11:36 PM D/w dr opyd for admission Pt stable in the ED  Final Clinical Impressions(s) / ED Diagnoses   Final diagnoses:  Chest pain, rule out acute myocardial infarction  Pulmonary nodule    New Prescriptions New Prescriptions   No medications on file     Zadie Rhine, MD 05/30/16 2336

## 2016-05-30 NOTE — Telephone Encounter (Signed)
Patient called and stated that she was having heaviness in her chest with some pain. Started early this afternoon. Advised her to go to ER. She agreed.

## 2016-05-30 NOTE — ED Triage Notes (Signed)
Pt here for chest heaviness that began this am.  Has some sob wit this.

## 2016-05-30 NOTE — H&P (Signed)
History and Physical    Amy Fenderatricia J Grabert VWU:[id number] DOB: 10/30/1935 DOA: 05/30/2016  PCP: Bufford SpikesEED, TIFFANY, DO   Patient coming from: Home   Chief Complaint: Chest pain, SOB  HPI: Amy Andrade is a 80 y.o. female with medical history significant for Alzheimer dementia, hypertension, and asthma who presents the emergency department for evaluation of chest pain with dyspnea. The patient reports that she had been in her usual state of health until the insidious onset of a "heavy" sensation in the central chest on the morning of admission, described as mild in intensity. There was some mild dyspnea associated with this and the symptoms occurred while she was at rest. She denied any associated nausea or diaphoresis, and the discomfort is nonradiating. She has history of PE, but denies any prolonged immobilization, recent surgery, or extremity swelling or tenderness. Patient has not experienced similar symptoms previously and did not attempt any interventions prior to coming in for evaluation. She denies any recent fevers or chills and denies cough. There is no pleuritic component to her chest pain.  ED Course: Upon arrival to the ED, patient is found to be afebrile, saturating well on room air, and with vital signs stable. EKG demonstrates normal sinus rhythm with anterior Q waves and chest x-ray is notable only for a subtle, nodular density in the right upper lung with nonemergent CT follow-up advised. Troponin is undetectable. CBC features a mild hyponatremia and hypochloremia, and CBC is unremarkable. Patient was treated with 324 mg aspirin in the emergency department, has remained hemodynamically stable without ischemic changes on telemetry monitoring, and will be observed on the telemetry unit for ongoing evaluation and management of chest pain concerning for possible ACS.  Review of Systems:  All other systems reviewed and apart from HPI, are negative.  Past Medical History:  Diagnosis Date    . Alzheimer disease   . Asthma   . Hyperlipidemia   . Hypertension   . Pulmonary embolism (HCC) 2004   was short of breath--was on coumadin for 6 mos to a year    Past Surgical History:  Procedure Laterality Date  . ABDOMINAL HYSTERECTOMY    . BUNIONECTOMY    . REPLACEMENT TOTAL KNEE  1980s, 2003   x2     reports that she quit smoking about 58 years ago. Her smoking use included Cigarettes. She has a 1.00 pack-year smoking history. She has never used smokeless tobacco. She reports that she drinks about 25.2 oz of alcohol per week . She reports that she does not use drugs.  No Known Allergies  Family History  Problem Relation Age of Onset  . Alzheimer's disease Mother   . Chronic bronchitis Mother   . Heart disease Father   . High blood pressure Father   . High blood pressure Brother   . Asthma Daughter   . High blood pressure Son   . High Cholesterol Son   . Post-traumatic stress disorder Son      Prior to Admission medications   Medication Sig Start Date End Date Taking? Authorizing Provider  albuterol (PROVENTIL HFA;VENTOLIN HFA) 108 (90 BASE) MCG/ACT inhaler Inhale 2 puffs into the lungs every 6 (six) hours as needed for wheezing or shortness of breath. 07/22/15  Yes Tiffany L Reed, DO  aspirin 81 MG tablet Take 81 mg by mouth daily.     Yes Historical Provider, MD  Budesonide-Formoterol Fumarate (SYMBICORT IN) Inhale into the lungs. Symbicort 120/4.5 1 puff 2 x a day   Yes  Historical Provider, MD  cycloSPORINE (RESTASIS) 0.05 % ophthalmic emulsion Place 1 drop into both eyes daily as needed (conjunctivitis).    Yes Historical Provider, MD  donepezil (ARICEPT) 10 MG tablet Take 10 mg by mouth daily.   Yes Historical Provider, MD  montelukast (SINGULAIR) 10 MG tablet Take 10 mg by mouth every morning.    Yes Historical Provider, MD  Olmesartan-Amlodipine-HCTZ (TRIBENZOR) 40-10-12.5 MG TABS Take 1 tablet by mouth every morning. 01/20/16  Yes Tiffany L Reed, DO  pravastatin  (PRAVACHOL) 40 MG tablet Take 1 tablet (40 mg total) by mouth daily. 03/28/16  Yes Kermit Balo, DO    Physical Exam: Vitals:   05/30/16 2145 05/30/16 2200 05/30/16 2230 05/30/16 2300  BP: 157/75 150/66 138/68 139/67  Pulse: 83 78 79 74  Resp: (!) 28 25 19 21   Temp:      TempSrc:      SpO2: 97% 91% 98% 95%  Weight:          Constitutional: NAD, calm, comfortable Eyes: PERTLA, lids and conjunctivae normal ENMT: Mucous membranes are moist. Posterior pharynx clear of any exudate or lesions.   Neck: normal, supple, no masses, no thyromegaly Respiratory: clear to auscultation bilaterally, no wheezing, no crackles. Normal respiratory effort.   Cardiovascular: S1 & S2 heard, regular rate and rhythm, no significant murmur. No extremity edema. No significant JVD. Abdomen: No distension, no tenderness, no masses palpated. Bowel sounds normal.  Musculoskeletal: no clubbing / cyanosis. No joint deformity upper and lower extremities. Normal muscle tone.  Skin: no significant rashes, lesions, ulcers. Warm, dry, well-perfused. Neurologic: CN 2-12 grossly intact. Sensation intact, DTR normal. Strength 5/5 in all 4 limbs.  Psychiatric: Normal judgment and insight. Alert and oriented x 3. Normal mood and affect.     Labs on Admission: I have personally reviewed following labs and imaging studies  CBC:  Recent Labs Lab 05/30/16 1547  WBC 5.2  HGB 14.3  HCT 42.5  MCV 100.2*  PLT 228   Basic Metabolic Panel:  Recent Labs Lab 05/30/16 1547  NA 131*  K 4.5  CL 95*  CO2 22  GLUCOSE 108*  BUN 13  CREATININE 0.94  CALCIUM 10.0   GFR: Estimated Creatinine Clearance: 58.1 mL/min (by C-G formula based on SCr of 0.94 mg/dL). Liver Function Tests: No results for input(s): AST, ALT, ALKPHOS, BILITOT, PROT, ALBUMIN in the last 168 hours. No results for input(s): LIPASE, AMYLASE in the last 168 hours. No results for input(s): AMMONIA in the last 168 hours. Coagulation Profile: No  results for input(s): INR, PROTIME in the last 168 hours. Cardiac Enzymes: No results for input(s): CKTOTAL, CKMB, CKMBINDEX, TROPONINI in the last 168 hours. BNP (last 3 results) No results for input(s): PROBNP in the last 8760 hours. HbA1C: No results for input(s): HGBA1C in the last 72 hours. CBG: No results for input(s): GLUCAP in the last 168 hours. Lipid Profile: No results for input(s): CHOL, HDL, LDLCALC, TRIG, CHOLHDL, LDLDIRECT in the last 72 hours. Thyroid Function Tests: No results for input(s): TSH, T4TOTAL, FREET4, T3FREE, THYROIDAB in the last 72 hours. Anemia Panel: No results for input(s): VITAMINB12, FOLATE, FERRITIN, TIBC, IRON, RETICCTPCT in the last 72 hours. Urine analysis: No results found for: COLORURINE, APPEARANCEUR, LABSPEC, PHURINE, GLUCOSEU, HGBUR, BILIRUBINUR, KETONESUR, PROTEINUR, UROBILINOGEN, NITRITE, LEUKOCYTESUR Sepsis Labs: @LABRCNTIP (procalcitonin:4,lacticidven:4) )No results found for this or any previous visit (from the past 240 hour(s)).   Radiological Exams on Admission: Dg Chest 2 View  Result Date: 05/30/2016 CLINICAL DATA:  Heaviness in chest with pain EXAM: CHEST  2 VIEW COMPARISON:  10/29/2007 FINDINGS: Normal heart size. No pleural effusion or edema. There is no airspace opacities identified. Subtle asymmetric nodular density in the right upper lung zone is identified which measures approximately 1.5 cm. The visualized osseous structures are unremarkable. IMPRESSION: 1. No evidence for pneumonia. 2. Subtle nodular density within the right upper lung zone is identified. Suggest further investigation with nonemergent, noncontrast CT of the chest. Electronically Signed   By: Signa Kell M.D.   On: 05/30/2016 16:39    EKG: Independently reviewed. Normal sinus rhythm, anterior Q-wave  Assessment/Plan  1. Chest pain  - RF's for ACS include age, HTN, HLD  - Initial EKG without acute ischemic findings  - Initial troponin 0.00  - Given ASA  324 mg in ED, Lopressor started, continue Pravachol - Monitor on telemetry for ischemic changes, obtain serial troponin measurements, repeat EKG  - If troponin stays flat and EKG non-ischemic, could consider further risk-stratification with stress test in am   2. Hypertension  - At goal currently  - Managed with olmesartan-amlodipine-HCTZ at home; this has been held for now and she is started on Lopressor in setting of possible ACS   3. Asthma  - Stable, no wheeze or dyspnea on admission  - Continue scheduled Symbicort and prn albuterol   4. Incidental lung nodule  - Subtle RUL nodular density noted on admission CXR  - Non-emergent CT follow-up advised  - Pt informed of the finding  5. Hyponatremia  - Serum sodium 131 on admission - Likely secondary to HCTZ, currently held  - Repeat chem panel in am    DVT prophylaxis: sq Lovenox  Code Status: Full  Family Communication: Discussed with patient Disposition Plan: Observe on telemetry Consults called: None Admission status: Observation    Briscoe Deutscher, MD [facility] Pager [phone]  If 7PM-7AM, please contact night-coverage [email or web address] Password [email or web address]  05/30/2016, 11:20 PM

## 2016-05-30 NOTE — ED Notes (Signed)
Admitting physician at bedside at this time.

## 2016-05-31 ENCOUNTER — Encounter (HOSPITAL_COMMUNITY): Payer: Self-pay

## 2016-05-31 ENCOUNTER — Observation Stay (HOSPITAL_BASED_OUTPATIENT_CLINIC_OR_DEPARTMENT_OTHER): Payer: Medicare Other

## 2016-05-31 ENCOUNTER — Other Ambulatory Visit (HOSPITAL_COMMUNITY): Payer: Self-pay

## 2016-05-31 ENCOUNTER — Observation Stay (HOSPITAL_COMMUNITY): Payer: Medicare Other

## 2016-05-31 DIAGNOSIS — I10 Essential (primary) hypertension: Secondary | ICD-10-CM | POA: Diagnosis not present

## 2016-05-31 DIAGNOSIS — R0789 Other chest pain: Secondary | ICD-10-CM | POA: Diagnosis not present

## 2016-05-31 DIAGNOSIS — R911 Solitary pulmonary nodule: Secondary | ICD-10-CM | POA: Diagnosis not present

## 2016-05-31 DIAGNOSIS — Z7982 Long term (current) use of aspirin: Secondary | ICD-10-CM | POA: Diagnosis not present

## 2016-05-31 DIAGNOSIS — J45909 Unspecified asthma, uncomplicated: Secondary | ICD-10-CM | POA: Diagnosis not present

## 2016-05-31 DIAGNOSIS — E871 Hypo-osmolality and hyponatremia: Secondary | ICD-10-CM | POA: Diagnosis not present

## 2016-05-31 DIAGNOSIS — R079 Chest pain, unspecified: Secondary | ICD-10-CM

## 2016-05-31 DIAGNOSIS — E785 Hyperlipidemia, unspecified: Secondary | ICD-10-CM

## 2016-05-31 LAB — TROPONIN I
Troponin I: <0.03 ng/mL (ref <0.03)
Troponin I: <0.03 ng/mL (ref <0.03)
Troponin I: <0.03 ng/mL (ref <0.03)

## 2016-05-31 LAB — BASIC METABOLIC PANEL
Anion gap: 12 (ref 5–15)
BUN: 13 mg/dL (ref 6–20)
CHLORIDE: 97 mmol/L — AB (ref 101–111)
CO2: 24 mmol/L (ref 22–32)
Calcium: 9.6 mg/dL (ref 8.9–10.3)
Creatinine, Ser: 0.88 mg/dL (ref 0.44–1.00)
GFR calc Af Amer: >60 mL/min (ref >60)
GFR calc non Af Amer: >60 mL/min (ref >60)
Glucose, Bld: 113 mg/dL — ABNORMAL HIGH (ref 65–99)
POTASSIUM: 4.2 mmol/L (ref 3.5–5.1)
SODIUM: 133 mmol/L — AB (ref 135–145)

## 2016-05-31 LAB — TSH: TSH: 2.108 u[IU]/mL (ref 0.350–4.500)

## 2016-05-31 LAB — ECHOCARDIOGRAM COMPLETE
HEIGHTINCHES: 68 in
WEIGHTICAEL: 2920 [oz_av]

## 2016-05-31 MED ORDER — NITROGLYCERIN 0.4 MG SL SUBL: 0.4000 mg | SUBLINGUAL_TABLET | SUBLINGUAL | 0 refills | Status: DC | PRN

## 2016-05-31 MED ORDER — AMLODIPINE BESYLATE 10 MG PO TABS: 10.0000 mg | ORAL_TABLET | Freq: Every day | ORAL | 0 refills | Status: DC

## 2016-05-31 MED ORDER — IRBESARTAN 300 MG PO TABS: 300.0000 mg | ORAL_TABLET | Freq: Every day | ORAL | 0 refills | Status: DC

## 2016-05-31 MED ORDER — IOPAMIDOL (ISOVUE-370) INJECTION 76%
80.0000 mL | Freq: Once | INTRAVENOUS | Status: AC | PRN
Start: 2016-05-31 — End: 2016-05-31
  Administered 2016-05-31: 80 mL via INTRAVENOUS

## 2016-05-31 MED ORDER — IOPAMIDOL (ISOVUE-370) INJECTION 76%
INTRAVENOUS | Status: AC
  Filled 2016-05-31: qty 100

## 2016-05-31 MED ORDER — AMLODIPINE BESYLATE 10 MG PO TABS
10.0000 mg | ORAL_TABLET | Freq: Every day | ORAL | Status: DC
  Administered 2016-05-31: 10 mg via ORAL
  Filled 2016-05-31: qty 1

## 2016-05-31 MED ORDER — SODIUM CHLORIDE 0.9 % IV SOLN
INTRAVENOUS | Status: DC
  Administered 2016-05-31: 50 mL/h via INTRAVENOUS

## 2016-05-31 MED ORDER — LORAZEPAM 2 MG/ML IJ SOLN: 1.0000 mg | INTRAMUSCULAR | Status: DC | PRN

## 2016-05-31 MED ORDER — IRBESARTAN 150 MG PO TABS
300.0000 mg | ORAL_TABLET | Freq: Every day | ORAL | Status: DC
  Administered 2016-05-31: 300 mg via ORAL
  Filled 2016-05-31: qty 2

## 2016-05-31 NOTE — Plan of Care (Signed)
Problem: Education: Goal: Knowledge of [facility] Education information/materials will improve Outcome: Completed/Met Date Met: 05/31/16 Pt educated throughout entire admission regarding tests, procedures, medications, labs, and available resources   Problem: Safety: Goal: Ability to remain free from injury will improve Outcome: Completed/Met Date Met: 05/31/16 Pt remains free from injury during this admission   Problem: Pain Managment: Goal: General experience of comfort will improve Outcome: Completed/Met Date Met: 05/31/16 Pt remains chest pain free  Problem: Physical Regulation: Goal: Will remain free from infection Outcome: Completed/Met Date Met: 05/31/16 Pt remains free from infection   Problem: Fluid Volume: Goal: Ability to maintain a balanced intake and output will improve Outcome: Completed/Met Date Met: 05/31/16 Pt has adequate intake and output   Problem: Nutrition: Goal: Adequate nutrition will be maintained Outcome: Completed/Met Date Met: 05/31/16 Pt has adequate nutrition   Problem: Bowel/Gastric: Goal: Will not experience complications related to bowel motility Outcome: Completed/Met Date Met: 05/31/16 Pt has no complications related to bowel motility   Problem: Consults Goal: Chest Pain Patient Education (See Patient Education module for education specifics.)  Outcome: Completed/Met Date Met: 05/31/16 Pt received education regarding chest pain   Problem: Phase I Progression Outcomes Goal: Anginal pain relieved Outcome: Completed/Met Date Met: 05/31/16 Pt has no chest pain   Problem: Phase II Progression Outcomes Goal: Anginal pain relieved Outcome: Completed/Met Date Met: 05/31/16 Pt remains chest pain free  Problem: Phase III Progression Outcomes Goal: No anginal pain Outcome: Completed/Met Date Met: 05/31/16 Pt remains chest pain free Goal: Tolerating diet Outcome: Completed/Met Date Met: 05/31/16 Pt able to tolerate current diet  with no difficulties    

## 2016-05-31 NOTE — Consult Note (Signed)
CARDIOLOGY CONSULT NOTE   Patient ID: Amy Andrade MRN: [id number] DOB/AGE: 1936-09-09 80 y.o.  Admit date: 05/30/2016  Primary Physician   Bufford Spikes, DO Primary Cardiologist  New Reason for Consultation   Chest pain Requesting Physician  Dr. Jerral Ralph  HPI: Amy Andrade is a 80 y.o. female with a history of Hypertension, hyperlipidemia, pulmonary embolism in 2004 and Alzheimer disease who presented to ER for evaluation of chest pain.  Patient husband died last month. She was in usual state of health up until yesterday morning when she had a sudden onset of chest tightness. No associated shortness of breath, nausea, vomiting or diaphoresis. No radiation of pain. Tightness lasted for a few hours and presented to ER for further evaluation. No recent travel or surgery. The patient's denies orthopnea, PND, syncope, lower extremity edema, abdominal pain or recent illness.  Upon presentation EKG shows sinus rhythm at rate of 73 bpm. Repeat EKG this morning shows sinus bradycardia at rate of 53 bpm. Troponin x 2 negative. TSH normal. Chest x-ray shows pulmonary nodule. She is chest tightness free since admission.   Past Medical History:  Diagnosis Date  . Alzheimer disease   . Asthma   . Hyperlipidemia   . Hypertension   . Pulmonary embolism (HCC) 2004   was short of breath--was on coumadin for 6 mos to a year     Past Surgical History:  Procedure Laterality Date  . ABDOMINAL HYSTERECTOMY    . BUNIONECTOMY    . REPLACEMENT TOTAL KNEE  1980s, 2003   x2    No Known Allergies  I have reviewed the patient's current medications . aspirin  81 mg Oral Daily  . budesonide-formoterol  1 puff Inhalation BID  . donepezil  10 mg Oral Daily  . enoxaparin (LOVENOX) injection  40 mg Subcutaneous Q24H  . metoprolol tartrate  12.5 mg Oral BID  . montelukast  10 mg Oral Daily  . pravastatin  40 mg Oral q1800  . sodium chloride flush  3 mL Intravenous Q12H   . sodium chloride      sodium chloride, acetaminophen, albuterol, cycloSPORINE, HYDROcodone-acetaminophen, LORazepam, nitroGLYCERIN, ondansetron (ZOFRAN) IV, sodium chloride flush, zolpidem  Prior to Admission medications   Medication Sig Start Date End Date Taking? Authorizing Provider  albuterol (PROVENTIL HFA;VENTOLIN HFA) 108 (90 BASE) MCG/ACT inhaler Inhale 2 puffs into the lungs every 6 (six) hours as needed for wheezing or shortness of breath. 07/22/15  Yes Tiffany L Reed, DO  aspirin 81 MG tablet Take 81 mg by mouth daily.     Yes Historical Provider, MD  Budesonide-Formoterol Fumarate (SYMBICORT IN) Inhale into the lungs. Symbicort 120/4.5 1 puff 2 x a day   Yes Historical Provider, MD  cycloSPORINE (RESTASIS) 0.05 % ophthalmic emulsion Place 1 drop into both eyes daily as needed (conjunctivitis).    Yes Historical Provider, MD  donepezil (ARICEPT) 10 MG tablet Take 10 mg by mouth daily.   Yes Historical Provider, MD  montelukast (SINGULAIR) 10 MG tablet Take 10 mg by mouth every morning.    Yes Historical Provider, MD  Olmesartan-Amlodipine-HCTZ (TRIBENZOR) 40-10-12.5 MG TABS Take 1 tablet by mouth every morning. 01/20/16  Yes Tiffany L Reed, DO  pravastatin (PRAVACHOL) 40 MG tablet Take 1 tablet (40 mg total) by mouth daily. 03/28/16  Yes Kermit Balo, DO     Social History   Social History  . Marital status: Married    Spouse name: N/A  . Number of children: N/A  .  Years of education: N/A   Occupational History  . Not on file.   Social History Main Topics  . Smoking status: Former Smoker    Packs/day: 0.25    Years: 4.00    Types: Cigarettes    Quit date: 09/17/1957  . Smokeless tobacco: Never Used  . Alcohol use 25.2 oz/week    42 Glasses of wine per week  . Drug use: No  . Sexual activity: Not on file   Other Topics Concern  . Not on file   Social History Narrative   Diet: Eats well      Do you drink/ eat things with caffeine?Yes, 2 cups of coffee a day      Marital status:     Married                           What year were you married ? 1966      Do you live in a house, apartment,assistred living, condo, trailer, etc.)? House      Is it one or more stories? 2      How many persons live in your home ?  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  Yes                            Type & how often: Stationary bike 2 x/week      Do you have a living will? Yes      Do you have a DNR form?  Yes                     If not, do you want to discuss one?       Do you have signed POA?HPOA forms?   Yes              If so, please bring to your        appointment          Family Status  Relation Status  . Mother Deceased at age 78  . Father Deceased  . Brother Alive  . Sister Deceased at age 78's  . Daughter Alive  . Son Alive   Family History  Problem Relation Age of Onset  . Alzheimer's disease Mother   . Chronic bronchitis Mother   . Heart disease Father   . High blood pressure Father   . High blood pressure Brother   . Asthma Daughter   . High blood pressure Son   . High Cholesterol Son   . Post-traumatic stress disorder Son      ROS:  Full 14 point review of systems complete and found to be negative unless listed above.  Physical Exam: Blood pressure (!) 146/72, pulse 61, temperature 97.8 F (36.6 C), temperature source Oral, resp. rate 20, height 5\' 8"  (1.727 m), weight 182 lb 8 oz (82.8 kg), SpO2 95 %.  General: Well developed, well nourished, female in no acute distress Head: Eyes PERRLA, No xanthomas. Normocephalic and atraumatic, oropharynx without edema or exudate.  Lungs: Resp regular and unlabored, CTA. Heart: RRR no s3, s4, or murmurs..   Neck: No carotid bruits. No lymphadenopathy.  JVD. Abdomen: Bowel sounds present, abdomen soft and non-tender without masses or  hernias noted. Msk:  No spine or cva tenderness. No weakness, no joint deformities or  effusions. Extremities: No clubbing, cyanosis or edema. DP/PT/Radials 2+ and equal bilaterally. Neuro: Alert and oriented X 3. No focal deficits noted. Psych:  Good affect, responds appropriately Skin: No rashes or lesions noted.  Labs:   Lab Results  Component Value Date   WBC 5.2 05/30/2016   HGB 14.3 05/30/2016   HCT 42.5 05/30/2016   MCV 100.2 (H) 05/30/2016   PLT 228 05/30/2016   No results for input(s): INR in the last 72 hours.  Recent Labs Lab 05/31/16 0550  NA 133*  K 4.2  CL 97*  CO2 24  BUN 13  CREATININE 0.88  CALCIUM 9.6  GLUCOSE 113*   No results found for: MG  Recent Labs  05/30/16 2317 05/31/16 0550  TROPONINI <0.03 <0.03    Recent Labs  05/30/16 1608  TROPIPOC 0.00   No results found for: PROBNP Lab Results  Component Value Date   CHOL 190 04/19/2015   HDL 82 04/19/2015   LDLCALC 83 04/19/2015   TRIG 125 04/19/2015   No results found for: DDIMER No results found for: LIPASE, AMYLASE TSH  Date/Time Value Ref Range Status  05/30/2016 11:17 PM 2.108 0.350 - 4.500 uIU/mL Final    ECG: Vent. rate 73 BPM PR interval 154 ms QRS duration 74 ms QT/QTc 372/409 ms P-R-T axes 70 21 65  Radiology:  Dg Chest 2 View  Result Date: 05/30/2016 CLINICAL DATA:  Heaviness in chest with pain EXAM: CHEST  2 VIEW COMPARISON:  10/29/2007 FINDINGS: Normal heart size. No pleural effusion or edema. There is no airspace opacities identified. Subtle asymmetric nodular density in the right upper lung zone is identified which measures approximately 1.5 cm. The visualized osseous structures are unremarkable. IMPRESSION: 1. No evidence for pneumonia. 2. Subtle nodular density within the right upper lung zone is identified. Suggest further investigation with nonemergent, noncontrast CT of the chest. Electronically Signed   By: Signa Kell M.D.   On: 05/30/2016 16:39    ASSESSMENT AND PLAN:     1. Chest pain - Atypical. She has ruled out. Troponin negative. EKG  nonischemic. He does have a history of PE and chest x-ray shows incidental finding of pulmonary nodule. Cannot rule out pulmonary etiology. Pending CT angio of chest And echocardiogram. If normal EF without wall motion abnormalities --> the patient can be discharged on cardiac perspective.  We will review with M.D.   2. HTN - Minimally elevated at times. Home combo of olmesartan-amlodipine-HCTZ  Held during admission and started on lowpressor. Telemetry shows bradycardia at rate of 40s to 50s. We will discontinue beta blocker. Restart home dose of olmesartan-amlodipine. Continue to hold HCTZ.   3. Sinus bradycardia - Will discontinue beta blocker as above.  4. Hyperlipidemia - Continue statin  Otherwise per primary   Asthma   Hypertension   Incidental lung nodule   History of pulmonary embolism   Hyponatremia   Signed: Mica Ramdass, PA 05/31/2016, 9:01 AM Pager [phone]  Co-Sign MD

## 2016-05-31 NOTE — Discharge Summary (Signed)
PATIENT DETAILS Name: Amy Andrade Age: 80 y.o. Sex: female Date of Birth: 08/24/1936 MRN: [id number]. Admitting Physician: Briscoe Deutscherimothy S Opyd, MD WUJ:[id number]:REED, TIFFANY, DO  Admit Date: 05/30/2016 Discharge date: 05/31/2016  Recommendations for Outpatient Follow-up:  1. Follow up with PCP in 1-2 weeks 2. Please obtain BMP/CBC in one week 3. HCTZ discontinued to due to mild Hyponatremia 4. Please follow up on the following pending results:  Admitted From:  Home  Disposition: Home   Home Health: No  Equipment/Devices: None  Discharge Condition: Stable  CODE STATUS: FULL CODE  Diet recommendation:  Heart Healthy  Brief Summary: See H&P, Labs, Consult and Test reports for all details in brief, patient was admitted for evaluation of chest pain  Brief Hospital Course: Chest pain: mostly atypical features, but history limited due to dementia. Troponin's negative, CTA Chest was negative for PE. Echo showed  55% to 60%,there were no regional wall motion abnormalities. Seen by cardiology, no further work up required, stable for discharge today.   Hypertension:controlled-continue Amlodipine and Avapro-HCTZ d/c'd due to hyponatremia. Further optimization deferred to PCP.  Asthma: Stable, no wheeze or dyspnea.Continue scheduled Symbicort and prn albuterol   Incidental lung nodule : Seen on Chest  Xray however CT Chest showed no lung nodule (d/w radiologist Dr Tyron RussellBoles).  Hyponatremia: very mild-HCTZ has been d/c'd-repeat labs at follow up with PCP  Dementia:pleasantly confused-suspect she is close to her usual baseline. Follow with PCP  Procedures/Studies: Echo 9/14>> 55% to 60%. Wall motion was normal;  there were no regional wall motion abnormalities.  Discharge Diagnoses:  Principal Problem:   Chest pain Active Problems:   Asthma   Hyperlipidemia   Hypertension   Incidental lung nodule   History of pulmonary embolism   Chest pain, rule out acute myocardial  infarction   Hyponatremia   Discharge Instructions:  Activity:  As tolerated with Full fall precautions use walker/cane & assistance as needed  Discharge Instructions    Call MD for:  severe uncontrolled pain    Complete by:  As directed    Diet - low sodium heart healthy    Complete by:  As directed    Discharge instructions    Complete by:  As directed    Follow with Primary MD  REED, TIFFANY, DO  and other consultant's as instructed your Hospitalist MD  Please get a complete blood count and chemistry panel checked by your Primary MD at your next visit, and again as instructed by your Primary MD.  Get Medicines reviewed and adjusted: Please take all your medications with you for your next visit with your Primary MD  Laboratory/radiological data: Please request your Primary MD to go over all hospital tests and procedure/radiological results at the follow up, please ask your Primary MD to get all Hospital records sent to his/her office.  In some cases, they will be blood work, cultures and biopsy results pending at the time of your discharge. Please request that your primary care M.D. follows up on these results.  Also Note the following: If you experience worsening of your admission symptoms, develop shortness of breath, life threatening emergency, suicidal or homicidal thoughts you must seek medical attention immediately by calling 911 or calling your MD immediately  if symptoms less severe.  You must read complete instructions/literature along with all the possible adverse reactions/side effects for all the Medicines you take and that have been prescribed to you. Take any new Medicines after you have completely understood and accpet all the  possible adverse reactions/side effects.   Do not drive when taking Pain medications or sleeping medications (Benzodaizepines)  Do not take more than prescribed Pain, Sleep and Anxiety Medications. It is not advisable to combine anxiety,sleep  and pain medications without talking with your primary care practitioner  Special Instructions: If you have smoked or chewed Tobacco  in the last 2 yrs please stop smoking, stop any regular Alcohol  and or any Recreational drug use.  Wear Seat belts while driving.  Please note: You were cared for by a hospitalist during your hospital stay. Once you are discharged, your primary care physician will handle any further medical issues. Please note that NO REFILLS for any discharge medications will be authorized once you are discharged, as it is imperative that you return to your primary care physician (or establish a relationship with a primary care physician if you do not have one) for your post hospital discharge needs so that they can reassess your need for medications and monitor your lab values.   Increase activity slowly    Complete by:  As directed        Medication List    STOP taking these medications   Olmesartan-Amlodipine-HCTZ 40-10-12.5 MG Tabs Commonly known as:  TRIBENZOR     TAKE these medications   albuterol 108 (90 Base) MCG/ACT inhaler Commonly known as:  PROVENTIL HFA;VENTOLIN HFA Inhale 2 puffs into the lungs every 6 (six) hours as needed for wheezing or shortness of breath.   amLODipine 10 MG tablet Commonly known as:  NORVASC Take 1 tablet (10 mg total) by mouth daily. Start taking on:  06/01/2016   aspirin 81 MG tablet Take 81 mg by mouth daily.   cycloSPORINE 0.05 % ophthalmic emulsion Commonly known as:  RESTASIS Place 1 drop into both eyes daily as needed (conjunctivitis).   donepezil 10 MG tablet Commonly known as:  ARICEPT Take 10 mg by mouth daily.   irbesartan 300 MG tablet Commonly known as:  AVAPRO Take 1 tablet (300 mg total) by mouth daily. Start taking on:  06/01/2016   montelukast 10 MG tablet Commonly known as:  SINGULAIR Take 10 mg by mouth every morning.   nitroGLYCERIN 0.4 MG SL tablet Commonly known as:  NITROSTAT Place 1 tablet  (0.4 mg total) under the tongue every 5 (five) minutes x 3 doses as needed for chest pain.   pravastatin 40 MG tablet Commonly known as:  PRAVACHOL Take 1 tablet (40 mg total) by mouth daily.   SYMBICORT IN Inhale into the lungs. Symbicort 120/4.5 1 puff 2 x a day      Follow-up Information    REED, TIFFANY, DO. Go on 06/07/2016.   Specialty:  Geriatric Medicine Why:  hospital follow up @ 230pm Contact information: [address] [place] [address] [phone]          No Known Allergies  Consultations:   cardiology  Other Procedures/Studies: Dg Chest 2 View  Result Date: 05/30/2016 CLINICAL DATA:  Heaviness in chest with pain EXAM: CHEST  2 VIEW COMPARISON:  10/29/2007 FINDINGS: Normal heart size. No pleural effusion or edema. There is no airspace opacities identified. Subtle asymmetric nodular density in the right upper lung zone is identified which measures approximately 1.5 cm. The visualized osseous structures are unremarkable. IMPRESSION: 1. No evidence for pneumonia. 2. Subtle nodular density within the right upper lung zone is identified. Suggest further investigation with nonemergent, noncontrast CT of the chest. Electronically Signed   By: Signa Kell  M.D.   On: 05/30/2016 16:39   Ct Angio Chest Pe W Or Wo Contrast  Result Date: 05/31/2016 CLINICAL DATA:  Mid chest pain and tightness for 1 week, history asthma, hypertension, pulmonary emboli in 2004, hyperlipidemia, former smoker EXAM: CT ANGIOGRAPHY CHEST WITH CONTRAST TECHNIQUE: Multidetector CT imaging of the chest was performed using the standard protocol during bolus administration of intravenous contrast. Multiplanar CT image reconstructions and MIPs were obtained to evaluate the vascular anatomy. CONTRAST:  80 cc Isovue 370 IV COMPARISON:  None FINDINGS: Cardiovascular: Atherosclerotic calcifications aorta coronary arteries. Aorta normal caliber. No pericardial effusion. Pulmonary arteries well opacified and  patent. No evidence of pulmonary embolism. Mediastinum/Nodes: No thoracic adenopathy. Mediastinum unremarkable. Lungs/Pleura: Dependent bibasilar atelectasis. Remaining lungs clear. No pleural effusion or pneumothorax. Upper Abdomen: Unremarkable Musculoskeletal: No acute osseous abnormalities. Review of the MIP images confirms the above findings. IMPRESSION: No evidence of pulmonary embolism. Minimal dependent bibasilar atelectasis. Aortic atherosclerosis and coronary arterial calcification Electronically Signed   By: Ulyses Southward M.D.   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 rate 17, height 5\' 8"  (1.727 m), weight 82.8 kg (182 lb 8 oz), SpO2 97 %.  Intake/Output Summary (Last 24 hours) at 05/31/16 1539 Last data filed at 05/31/16 0600  Gross per 24 hour  Intake                0 ml  Output              600 ml  Net             -600 ml   Filed Weights   05/30/16 1543 05/31/16 0007 05/31/16 0613  Weight: 90.7 kg (200 lb) 83.6 kg (184 lb 4.8 oz) 82.8 kg (182 lb 8 oz)    Exam: Awake Alert, Oriented *3, No new F.N deficits, Normal affect .AT,PERRAL Supple Neck,No JVD, No cervical lymphadenopathy appriciated.  Symmetrical Chest wall movement, Good air movement bilaterally, CTAB RRR,No Gallops,Rubs or new Murmurs, No Parasternal Heave +ve B.Sounds, Abd Soft, Non tender, No organomegaly appriciated, No rebound -guarding or rigidity. No Cyanosis, Clubbing or edema, No new Rash or bruise   PERTINENT RADIOLOGIC STUDIES: Dg Chest 2 View  Result Date: 05/30/2016 CLINICAL DATA:  Heaviness in chest with pain EXAM: CHEST  2 VIEW COMPARISON:  10/29/2007 FINDINGS: Normal heart size. No pleural effusion or edema. There is no airspace opacities identified. Subtle  asymmetric nodular density in the right upper lung zone is identified which measures approximately 1.5 cm. The visualized osseous structures are unremarkable. IMPRESSION: 1. No evidence for pneumonia. 2. Subtle nodular density within the right upper lung zone is identified. Suggest further investigation with nonemergent, noncontrast CT of the chest. Electronically Signed   By: Signa Kell M.D.   On: 05/30/2016 16:39   Ct Angio Chest Pe W Or Wo Contrast  Result Date: 05/31/2016 CLINICAL DATA:  Mid chest pain and tightness for 1 week, history asthma, hypertension, pulmonary emboli in 2004, hyperlipidemia, former smoker EXAM: CT ANGIOGRAPHY CHEST WITH CONTRAST TECHNIQUE: Multidetector CT imaging of the chest was performed using the standard protocol during bolus administration of intravenous contrast. Multiplanar CT image reconstructions and MIPs were obtained to evaluate the vascular anatomy. CONTRAST:  80 cc Isovue 370 IV COMPARISON:  None FINDINGS: Cardiovascular: Atherosclerotic  calcifications aorta coronary arteries. Aorta normal caliber. No pericardial effusion. Pulmonary arteries well opacified and patent. No evidence of pulmonary embolism. Mediastinum/Nodes: No thoracic adenopathy. Mediastinum unremarkable. Lungs/Pleura: Dependent bibasilar atelectasis. Remaining lungs clear. No pleural effusion or pneumothorax. Upper Abdomen: Unremarkable Musculoskeletal: No acute osseous abnormalities. Review of the MIP images confirms the above findings. IMPRESSION: No evidence of pulmonary embolism. Minimal dependent bibasilar atelectasis. Aortic atherosclerosis and coronary arterial calcification Electronically Signed   By: Ulyses Southward M.D.   On: 05/31/2016 10:57     PERTINENT LAB RESULTS: CBC:  Recent Labs  05/30/16 1547  WBC 5.2  HGB 14.3  HCT 42.5  PLT 228   CMET CMP     Component Value Date/Time   NA 133 (L) 05/31/2016 0550   NA 136 04/19/2015 1015   K 4.2 05/31/2016 0550   CL 97 (L)  05/31/2016 0550   CO2 24 05/31/2016 0550   GLUCOSE 113 (H) 05/31/2016 0550   BUN 13 05/31/2016 0550   BUN 17 04/19/2015 1015   CREATININE 0.88 05/31/2016 0550   CALCIUM 9.6 05/31/2016 0550   GFRNONAA >60 05/31/2016 0550   GFRAA >60 05/31/2016 0550    GFR Estimated Creatinine Clearance: 59.5 mL/min (by C-G formula based on SCr of 0.88 mg/dL). No results for input(s): LIPASE, AMYLASE in the last 72 hours.  Recent Labs  05/30/16 2317 05/31/16 0550 05/31/16 1143  TROPONINI <0.03 <0.03 <0.03   Invalid input(s): POCBNP No results for input(s): DDIMER in the last 72 hours. No results for input(s): HGBA1C in the last 72 hours. No results for input(s): CHOL, HDL, LDLCALC, TRIG, CHOLHDL, LDLDIRECT in the last 72 hours.  Recent Labs  05/30/16 2317  TSH 2.108   No results for input(s): VITAMINB12, FOLATE, FERRITIN, TIBC, IRON, RETICCTPCT in the last 72 hours. Coags: No results for input(s): INR in the last 72 hours.  Invalid input(s): PT Microbiology: No results found for this or any previous visit (from the past 240 hour(s)).  FURTHER DISCHARGE INSTRUCTIONS:  Get Medicines reviewed and adjusted: Please take all your medications with you for your next visit with your Primary MD  Laboratory/radiological data: Please request your Primary MD to go over all hospital tests and procedure/radiological results at the follow up, please ask your Primary MD to get all Hospital records sent to his/her office.  In some cases, they will be blood work, cultures and biopsy results pending at the time of your discharge. Please request that your primary care M.D. goes through all the records of your hospital data and follows up on these results.  Also Note the following: If you experience worsening of your admission symptoms, develop shortness of breath, life threatening emergency, suicidal or homicidal thoughts you must seek medical attention immediately by calling 911 or calling your MD  immediately  if symptoms less severe.  You must read complete instructions/literature along with all the possible adverse reactions/side effects for all the Medicines you take and that have been prescribed to you. Take any new Medicines after you have completely understood and accpet all the possible adverse reactions/side effects.   Do not drive when taking Pain medications or sleeping medications (Benzodaizepines)  Do not take more than prescribed Pain, Sleep and Anxiety Medications. It is not advisable to combine anxiety,sleep and pain medications without talking with your primary care practitioner  Special Instructions: If you have smoked or chewed Tobacco  in the last 2 yrs please stop smoking, stop any regular Alcohol  and or any Recreational drug  use.  Wear Seat belts while driving.  Please note: You were cared for by a hospitalist during your hospital stay. Once you are discharged, your primary care physician will handle any further medical issues. Please note that NO REFILLS for any discharge medications will be authorized once you are discharged, as it is imperative that you return to your primary care physician (or establish a relationship with a primary care physician if you do not have one) for your post hospital discharge needs so that they can reassess your need for medications and monitor your lab values.  Total Time spent coordinating discharge including counseling, education and face to face time equals 35 minutes.  SignedJeoffrey Massed 05/31/2016 3:39 PM

## 2016-05-31 NOTE — Progress Notes (Signed)
  Echocardiogram 2D Echocardiogram has been performed.  Arvil ChacoFoster, Janaysha Depaulo 05/31/2016, 1:29 PM

## 2016-05-31 NOTE — Progress Notes (Signed)
Echo showed normal LVF with no RWMAs.  There was mild late systolic MVP of the AMVL with mild MR.  Several hours of CP with no associated sx and no ischemic changes on EKG and normal enzymes. No further cardiac workup at this time given her significant dementia and comorbidities.  Will sign off.  Call with any questions.

## 2016-06-07 ENCOUNTER — Encounter: Payer: Self-pay | Admitting: Internal Medicine

## 2016-06-07 ENCOUNTER — Encounter: Payer: Medicare Other | Admitting: Internal Medicine

## 2016-06-18 ENCOUNTER — Encounter: Payer: Self-pay | Admitting: Internal Medicine

## 2016-06-18 ENCOUNTER — Ambulatory Visit (INDEPENDENT_AMBULATORY_CARE_PROVIDER_SITE_OTHER): Payer: Medicare Other | Admitting: Internal Medicine

## 2016-06-18 VITALS — BP 140/80 | HR 67 | Temp 98.1°F | Ht 68.0 in | Wt 181.0 lb

## 2016-06-18 DIAGNOSIS — R911 Solitary pulmonary nodule: Secondary | ICD-10-CM

## 2016-06-18 DIAGNOSIS — Z23 Encounter for immunization: Secondary | ICD-10-CM

## 2016-06-18 DIAGNOSIS — J4520 Mild intermittent asthma, uncomplicated: Secondary | ICD-10-CM

## 2016-06-18 DIAGNOSIS — I10 Essential (primary) hypertension: Secondary | ICD-10-CM

## 2016-06-18 DIAGNOSIS — E871 Hypo-osmolality and hyponatremia: Secondary | ICD-10-CM | POA: Diagnosis not present

## 2016-06-18 DIAGNOSIS — G301 Alzheimer's disease with late onset: Secondary | ICD-10-CM | POA: Diagnosis not present

## 2016-06-18 DIAGNOSIS — Z0000 Encounter for general adult medical examination without abnormal findings: Secondary | ICD-10-CM | POA: Diagnosis not present

## 2016-06-18 DIAGNOSIS — Z6827 Body mass index (BMI) 27.0-27.9, adult: Secondary | ICD-10-CM | POA: Diagnosis not present

## 2016-06-18 DIAGNOSIS — E785 Hyperlipidemia, unspecified: Secondary | ICD-10-CM | POA: Diagnosis not present

## 2016-06-18 DIAGNOSIS — F0280 Dementia in other diseases classified elsewhere without behavioral disturbance: Secondary | ICD-10-CM | POA: Diagnosis not present

## 2016-06-18 LAB — BASIC METABOLIC PANEL WITH GFR
BUN: 11 mg/dL (ref 7–25)
CO2: 26 mmol/L (ref 20–31)
Calcium: 9.6 mg/dL (ref 8.6–10.4)
Chloride: 94 mmol/L — ABNORMAL LOW (ref 98–110)
Creat: 0.77 mg/dL (ref 0.60–0.93)
GFR, Est African American: 86 mL/min (ref >=60)
GFR, Est Non African American: 74 mL/min (ref >=60)
Glucose, Bld: 107 mg/dL — ABNORMAL HIGH (ref 65–99)
Potassium: 4.4 mmol/L (ref 3.5–5.3)
Sodium: 130 mmol/L — ABNORMAL LOW (ref 135–146)

## 2016-06-18 LAB — CBC WITH DIFFERENTIAL/PLATELET
Basophils Absolute: 0 cells/uL (ref 0–200)
Basophils Relative: 0 %
Eosinophils Absolute: 42 cells/uL (ref 15–500)
Eosinophils Relative: 1 %
HCT: 41.7 % (ref 35.0–45.0)
Hemoglobin: 14.1 g/dL (ref 11.7–15.5)
Lymphocytes Relative: 36 %
Lymphs Abs: 1512 cells/uL (ref 850–3900)
MCH: 33.5 pg — ABNORMAL HIGH (ref 27.0–33.0)
MCHC: 33.8 g/dL (ref 32.0–36.0)
MCV: 99 fL (ref 80.0–100.0)
MPV: 9.8 fL (ref 7.5–12.5)
Monocytes Absolute: 462 cells/uL (ref 200–950)
Monocytes Relative: 11 %
Neutro Abs: 2184 cells/uL (ref 1500–7800)
Neutrophils Relative %: 52 %
Platelets: 200 10*3/uL (ref 140–400)
RBC: 4.21 MIL/uL (ref 3.80–5.10)
RDW: 12.8 % (ref 11.0–15.0)
WBC: 4.2 10*3/uL (ref 3.8–10.8)

## 2016-06-18 LAB — HEPATIC FUNCTION PANEL
ALT: 14 U/L (ref 6–29)
AST: 23 U/L (ref 10–35)
Albumin: 4.1 g/dL (ref 3.6–5.1)
Alkaline Phosphatase: 40 U/L (ref 33–130)
Bilirubin, Direct: 0.2 mg/dL (ref <=0.2)
Indirect Bilirubin: 0.5 mg/dL (ref 0.2–1.2)
Total Bilirubin: 0.7 mg/dL (ref 0.2–1.2)
Total Protein: 6.6 g/dL (ref 6.1–8.1)

## 2016-06-18 NOTE — Progress Notes (Signed)
Location:  [facility] clinic Provider: Zaine Elsass L. Mariea Clonts, D.O., C.M.D.  Patient Care Team: Gayland Curry, DO as PCP - General (Geriatric Medicine) Marylynn Pearson, MD as Consulting Physician (Ophthalmology)  Extended Emergency Contact Information Primary Emergency Contact: Doescher,[name] Address: [address] [place], [place] [address] [name] Phone: [phone] Relation: Other Secondary Emergency Contact: Aungst,[name] Address: [place], [place] [address] [name] Phone: [phone] Relation: Son  Code Status:  DNR Goals of Care: Advanced Directive information Advanced Directives 06/18/2016  Does patient have an advance directive? Yes  Type of Advance Directive [place]  Does patient want to make changes to advanced directive? -  Copy of advanced directive(s) in chart? Yes    Chief Complaint  Patient presents with  . Annual Exam    WELLNESS EXAM    HPI: Patient is a 80 y.o. female seen in today for an annual wellness exam, CPE and ED f/u.    She went to the ED with chest tightness 9/13 and was kept thru 9/14.    Her sodium was low and hctz was discontinued.  Continued on amlodipine.  Cardiac and PE workup negative.  CT chest was negative for lung nodule.  No more chest pain since.    Depression screen [facility] 2/9 06/18/2016 01/20/2016 04/21/2015 03/07/2015  Decreased Interest 0 0 0 0  Down, Depressed, Hopeless 0 0 0 0  PHQ - 2 Score 0 0 0 0    Fall Risk  06/18/2016 01/20/2016 04/21/2015 03/07/2015  Falls in the past year? No Yes No No  Number falls in past yr: - 2 or more - -  Injury with Fall? - No - -   MMSE - Mini Mental State Exam 06/18/2016 04/21/2015  Orientation to time 3 2  Orientation to Place 5 5  Registration 3 3  Attention/ Calculation 5 5  Recall 3 1  Language- name 2 objects 2 2  Language- repeat 1 1  Language- follow 3 step command 3 3  Language- read & follow  direction 1 1  Write a sentence 1 1  Copy design 1 1  Total score 28 25  Passed clock.    Health Maintenance  Topic Date Due  . TETANUS/TDAP  10/22/1956  . DEXA SCAN  10/22/2002  . INFLUENZA VACCINE  Completed  . ZOSTAVAX  Completed  . PNA vac Low Risk Adult  Completed     Functional Status Survey: Is the patient deaf or have difficulty hearing?: No Does the patient have difficulty seeing, even when wearing glasses/contacts?: No (wears glasses--years since last check) Does the patient have difficulty concentrating, remembering, or making decisions?: Yes Does the patient have difficulty walking or climbing stairs?: No Does the patient have difficulty dressing or bathing?: No Does the patient have difficulty doing errands alone such as visiting a doctor's office or shopping?: Yes Current Exercise Habits: The patient does not participate in regular exercise at present Exercise limited by: orthopedic condition(s) Diet? No special diet Vision Screening Comments: Dr.Brewington  Needs to follow up as advised at a previous visit, but has not yet gone Dentition: no recent problems, sees dentist by report  Past Medical History:  Diagnosis Date  . Alzheimer disease   . Asthma   . Hyperlipidemia   . Hypertension   .  Pulmonary embolism ([place]) 2004   was short of breath--was on coumadin for 6 mos to a year    Past Surgical History:  Procedure Laterality Date  . ABDOMINAL HYSTERECTOMY    . BUNIONECTOMY    . REPLACEMENT TOTAL KNEE  1980s, 2003   x2    Family History  Problem Relation Age of Onset  . Alzheimer's disease Mother   . Chronic bronchitis Mother   . Heart disease Father   . High blood pressure Father   . High blood pressure Brother   . Asthma Daughter   . High blood pressure Son   . High Cholesterol Son   . Post-traumatic stress disorder Son     Social History   Social History  . Marital status: Widowed    Spouse name: N/A  . Number of children: N/A  . Years  of education: N/A   Social History Main Topics  . Smoking status: Former Smoker    Packs/day: 0.25    Years: 4.00    Types: Cigarettes    Quit date: 09/17/1957  . Smokeless tobacco: Never Used  . Alcohol use 25.2 oz/week    42 Glasses of wine per week  . Drug use: No  . Sexual activity: Not Asked   Other Topics Concern  . None   Social History Narrative   Diet: Eats well      Do you drink/ eat things with caffeine?Yes, 2 cups of coffee a day      Marital status:    Married--widowed in 2017                           What year were you married ? 1966      Do you live in a house, apartment,assistred living, condo, trailer, etc.)? House      Is it one or more stories? 2      How many persons live in your home ?  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  Yes                            Type & how often: Stationary bike 2 x/week      Do you have a living will? Yes      Do you have a DNR form?  Yes                     If not, do you want to discuss one?       Do you have signed POA?HPOA forms?   Yes              If so, please bring to your        appointment          reports that she quit smoking about 58 years ago. Her smoking use included Cigarettes. She has a 1.00 pack-year smoking history. She has never used smokeless tobacco. She reports that she drinks about 25.2 oz of alcohol per week . She reports that she does not use drugs.  No Known Allergies    Medication List       Accurate as of 06/18/16 11:59 PM. Always use your most recent med list.  albuterol 108 (90 Base) MCG/ACT inhaler Commonly known as:  PROVENTIL HFA;VENTOLIN HFA Inhale 2 puffs into the lungs every 6 (six) hours as needed for wheezing or shortness of breath.   amLODipine 10 MG tablet Commonly known as:  NORVASC Take 1 tablet (10 mg total) by mouth daily.   aspirin 81 MG tablet Take 81 mg by mouth  daily.   cycloSPORINE 0.05 % ophthalmic emulsion Commonly known as:  RESTASIS Place 1 drop into both eyes daily as needed (conjunctivitis).   donepezil 10 MG tablet Commonly known as:  ARICEPT Take 10 mg by mouth daily.   irbesartan 300 MG tablet Commonly known as:  AVAPRO Take 1 tablet (300 mg total) by mouth daily.   montelukast 10 MG tablet Commonly known as:  SINGULAIR Take 10 mg by mouth every morning.   nitroGLYCERIN 0.4 MG SL tablet Commonly known as:  NITROSTAT Place 1 tablet (0.4 mg total) under the tongue every 5 (five) minutes x 3 doses as needed for chest pain.   pravastatin 40 MG tablet Commonly known as:  PRAVACHOL Take 1 tablet (40 mg total) by mouth daily.   SYMBICORT IN Inhale into the lungs. Symbicort 120/4.5 1 puff 2 x a day       Review of Systems:  Review of Systems  Constitutional: Negative for chills, fever, malaise/fatigue and weight loss.  HENT: Negative for congestion and hearing loss.   Eyes: Negative for blurred vision.       Glasses--needs eye exam  Respiratory: Negative for cough and shortness of breath.   Cardiovascular: Negative for chest pain, palpitations and leg swelling.  Gastrointestinal: Positive for constipation. Negative for abdominal pain, blood in stool, diarrhea, heartburn, melena, nausea and vomiting.  Genitourinary: Negative for dysuria, frequency and urgency.  Musculoskeletal: Positive for back pain. Negative for falls, joint pain, myalgias and neck pain.  Skin: Negative for itching and rash.  Neurological: Negative for dizziness, loss of consciousness, weakness and headaches.  Endo/Heme/Allergies: Does not bruise/bleed easily.  Psychiatric/Behavioral: Positive for memory loss. Negative for depression. The patient is not nervous/anxious and does not have insomnia.        Husband recently passed away     Physical Exam: Vitals:   06/18/16 0934  BP: 140/80  Pulse: 67  Temp: 98.1 F (36.7 C)  TempSrc: Oral  SpO2: 97%    Weight: 181 lb (82.1 kg)  Height: '5\' 8"'$  (1.727 m)   Body mass index is 27.52 kg/m. Physical Exam  Constitutional: She appears well-developed and well-nourished. No distress.  HENT:  Head: Normocephalic and atraumatic.  Right Ear: External ear normal.  Left Ear: External ear normal.  Nose: Nose normal.  Mouth/Throat: Oropharynx is clear and moist. No oropharyngeal exudate.  Eyes: Conjunctivae and EOM are normal. Pupils are equal, round, and reactive to light. No scleral icterus.  glasses  Neck: Neck supple. No JVD present. No thyromegaly present.  Pulmonary/Chest: Effort normal and breath sounds normal. No respiratory distress. Right breast exhibits no inverted nipple, no mass, no nipple discharge, no skin change and no tenderness. Left breast exhibits no inverted nipple, no mass, no nipple discharge, no skin change and no tenderness.  Abdominal: Soft. Bowel sounds are normal. She exhibits no distension and no mass. There is no tenderness. There is no rebound and no guarding. No hernia.  Musculoskeletal: Normal range of motion. She exhibits no edema or tenderness.  Slow to stand up  Lymphadenopathy:    She has no cervical adenopathy.  Neurological: She is  alert.  Oriented to person and place, not precise time (wrong date)  Skin: Skin is warm and dry. Capillary refill takes less than 2 seconds.  Psychiatric: She has a normal mood and affect.    Labs reviewed: Basic Metabolic Panel:  Recent Labs  05/30/16 1547 05/30/16 2317 05/31/16 0550 06/18/16 1538  NA 131*  --  133* 130*  K 4.5  --  4.2 4.4  CL 95*  --  97* 94*  CO2 22  --  24 26  GLUCOSE 108*  --  113* 107*  BUN 13  --  13 11  CREATININE 0.94  --  0.88 0.77  CALCIUM 10.0  --  9.6 9.6  TSH  --  2.108  --   --    Liver Function Tests:  Recent Labs  06/18/16 1055  AST 23  ALT 14  ALKPHOS 40  BILITOT 0.7  PROT 6.6  ALBUMIN 4.1   No results for input(s): LIPASE, AMYLASE in the last 8760 hours. No results for  input(s): AMMONIA in the last 8760 hours. CBC:  Recent Labs  05/30/16 1547 06/18/16 1538  WBC 5.2 4.2  NEUTROABS  --  2,184  HGB 14.3 14.1  HCT 42.5 41.7  MCV 100.2* 99.0  PLT 228 200   Lipid Panel: No results for input(s): CHOL, HDL, LDLCALC, TRIG, CHOLHDL, LDLDIRECT in the last 8760 hours. Lab Results  Component Value Date   HGBA1C 6.4 (H) 04/19/2015    Procedures: Dg Chest 2 View  Result Date: 05/30/2016 CLINICAL DATA:  Heaviness in chest with pain EXAM: CHEST  2 VIEW COMPARISON:  10/29/2007 FINDINGS: Normal heart size. No pleural effusion or edema. There is no airspace opacities identified. Subtle asymmetric nodular density in the right upper lung zone is identified which measures approximately 1.5 cm. The visualized osseous structures are unremarkable. IMPRESSION: 1. No evidence for pneumonia. 2. Subtle nodular density within the right upper lung zone is identified. Suggest further investigation with nonemergent, noncontrast CT of the chest. Electronically Signed   By: Kerby Moors M.D.   On: 05/30/2016 16:39   Ct Angio Chest Pe W Or Wo Contrast  Result Date: 05/31/2016 CLINICAL DATA:  Mid chest pain and tightness for 1 week, history asthma, hypertension, pulmonary emboli in 2004, hyperlipidemia, former smoker EXAM: CT ANGIOGRAPHY CHEST WITH CONTRAST TECHNIQUE: Multidetector CT imaging of the chest was performed using the standard protocol during bolus administration of intravenous contrast. Multiplanar CT image reconstructions and MIPs were obtained to evaluate the vascular anatomy. CONTRAST:  80 cc Isovue 370 IV COMPARISON:  None FINDINGS: Cardiovascular: Atherosclerotic calcifications aorta coronary arteries. Aorta normal caliber. No pericardial effusion. Pulmonary arteries well opacified and patent. No evidence of pulmonary embolism. Mediastinum/Nodes: No thoracic adenopathy. Mediastinum unremarkable. Lungs/Pleura: Dependent bibasilar atelectasis. Remaining lungs clear. No  pleural effusion or pneumothorax. Upper Abdomen: Unremarkable Musculoskeletal: No acute osseous abnormalities. Review of the MIP images confirms the above findings. IMPRESSION: No evidence of pulmonary embolism. Minimal dependent bibasilar atelectasis. Aortic atherosclerosis and coronary arterial calcification Electronically Signed   By: Lavonia Dana M.D.   On: 05/31/2016 10:57    Assessment/Plan 1. Annual physical exam - performed today, see above - Hepatic Function Panel; Future - Hepatic Function Panel  2. Medicare annual wellness visit, subsequent -see flowsheets and hpi -needs up to date eye exam -given flu shot, needs tdap or td if we can give her that next time  3. Hyponatremia - was noted again on labs--of note, hctz was stopped during a  hospitalization and pt got restarted on it--will stop it again and monitor bp--if bp staying over 150/90, will need to add something else, also repeat bmp - BMP with eGFR  4. 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 Essential hypertension -bp elevated today, but upset about her son coming with her to the appt - CBC with Differential/Platelet  6. Mild intermittent asthma without complication Stable on current regimen w/o exacerbation, son going to make sure she's been taking her inhalers properly   7. Hyperlipidemia, unspecified hyperlipidemia type -continues on pravachol therapy  8. Incidental lung nodule -noted on imaging, but unchanged from previous, monitor  9. Need for immunization against influenza - Flu Vaccine QUAD 36+ mos PF IM (Fluarix & Fluzone Quad PF)  10. Body mass index 27.0-27.9, adult -noted, pt eating and drinking well, likely will lose  weight on aricept and with progression of dementia, so would not pressure weight loss  Labs/tests ordered:  Orders Placed This Encounter  Procedures  . Flu Vaccine QUAD 36+ mos PF IM (Fluarix & Fluzone Quad PF)  . Hepatic Function Panel    Standing Status:   Future    Number of Occurrences:   1    Standing Expiration Date:   06/22/2016  . BMP with eGFR  . CBC with Differential/Platelet    Next appt:  10/22/2016 for med mgt, come fasting for cholesterol test  Isabeau Mccalla L. Dymon Summerhill, D.O. [facility] [address], [place] [address] Cell Phone (Mon-Fri 8am-5pm):  [phone] On Call:  [phone] & follow prompts after 5pm & weekends Office Phone:  [phone] Office Fax:  [phone]

## 2016-06-25 ENCOUNTER — Encounter: Payer: Self-pay | Admitting: Internal Medicine

## 2016-07-18 ENCOUNTER — Other Ambulatory Visit: Payer: Self-pay | Admitting: Internal Medicine

## 2016-08-19 ENCOUNTER — Other Ambulatory Visit: Payer: Self-pay | Admitting: Internal Medicine

## 2016-10-19 ENCOUNTER — Telehealth: Payer: Self-pay | Admitting: Internal Medicine

## 2016-10-19 NOTE — Telephone Encounter (Signed)
left msg asking pt to schedule AWV and CPE that are due in Oct. 2018. VDM (DD)

## 2016-10-22 ENCOUNTER — Encounter: Payer: Self-pay | Admitting: Internal Medicine

## 2016-10-22 ENCOUNTER — Ambulatory Visit (INDEPENDENT_AMBULATORY_CARE_PROVIDER_SITE_OTHER): Payer: Medicare Other | Admitting: Internal Medicine

## 2016-10-22 VITALS — BP 138/70 | HR 70 | Temp 98.0°F | Ht 68.0 in | Wt 187.0 lb

## 2016-10-22 DIAGNOSIS — I10 Essential (primary) hypertension: Secondary | ICD-10-CM | POA: Diagnosis not present

## 2016-10-22 DIAGNOSIS — E871 Hypo-osmolality and hyponatremia: Secondary | ICD-10-CM | POA: Diagnosis not present

## 2016-10-22 DIAGNOSIS — E7800 Pure hypercholesterolemia, unspecified: Secondary | ICD-10-CM

## 2016-10-22 DIAGNOSIS — G8929 Other chronic pain: Secondary | ICD-10-CM | POA: Diagnosis not present

## 2016-10-22 DIAGNOSIS — G301 Alzheimer's disease with late onset: Secondary | ICD-10-CM | POA: Diagnosis not present

## 2016-10-22 DIAGNOSIS — J4520 Mild intermittent asthma, uncomplicated: Secondary | ICD-10-CM

## 2016-10-22 DIAGNOSIS — M25511 Pain in right shoulder: Secondary | ICD-10-CM | POA: Diagnosis not present

## 2016-10-22 DIAGNOSIS — F0280 Dementia in other diseases classified elsewhere without behavioral disturbance: Secondary | ICD-10-CM

## 2016-10-22 MED ORDER — ALBUTEROL SULFATE HFA 108 (90 BASE) MCG/ACT IN AERS: 2.0000 | INHALATION_SPRAY | Freq: Four times a day (QID) | RESPIRATORY_TRACT | 3 refills | Status: DC | PRN

## 2016-10-22 NOTE — Progress Notes (Signed)
Location:   [facility]   Place of Service:   clinic  Provider: Takeria Marquina L. Renato Gails, D.O., C.M.D.  Code Status: DNR Goals of Care:  Advanced Directives 06/18/2016  Does Patient Have a Medical Advance Directive? Yes  Type of Advance Directive Healthcare Power of Attorney  Does patient want to make changes to medical advance directive? -  Copy of Healthcare Power of Attorney in Chart? Yes   Chief Complaint  Patient presents with  . Medical Management of Chronic Issues    follow-up    HPI: Patient is a 81 y.o. female seen today for medical management of chronic diseases.    C/o right shoulder pain with limited ROM. Her son has been massaging her right shoulder blade and it relieves the pain some. Has history of chronic pain to shoulder, but has been constant for several months. No falls or injuries to shoulder. Son is thinking she may need a muscle relaxant. This bad for about three months.    Has Alzheimer's Disease, memory is unchanged per son. Her husband passed away last year and is adjusting to this change in life.   Pt lives alone, but her son lives 4 houses down. Has family support to help with meals and medications.   Wondering about her inhalers and is she needs a refill. Is using Singulair, and has albuterol but isn't having to use it.   Past Medical History:  Diagnosis Date  . Alzheimer disease   . Asthma   . Hyperlipidemia   . Hypertension   . Pulmonary embolism ([facility]) 2004   was short of breath--was on coumadin for 6 mos to a year    Past Surgical History:  Procedure Laterality Date  . ABDOMINAL HYSTERECTOMY    . BUNIONECTOMY    . REPLACEMENT TOTAL KNEE  1980s, 2003   x2    No Known Allergies  Allergies as of 10/22/2016   No Known Allergies     Medication List       Accurate as of 10/22/16 11:51 AM. Always use your most recent med list.          albuterol 108 (90 Base) MCG/ACT inhaler Commonly known as:  PROVENTIL HFA;VENTOLIN HFA Inhale 2 puffs into  the lungs every 6 (six) hours as needed for wheezing or shortness of breath.   amLODipine 10 MG tablet Commonly known as:  NORVASC take 1 tablet by mouth once daily   aspirin 81 MG tablet Take 81 mg by mouth daily.   donepezil 10 MG tablet Commonly known as:  ARICEPT Take 10 mg by mouth daily.   irbesartan 300 MG tablet Commonly known as:  AVAPRO take 1 tablet by mouth once daily   montelukast 10 MG tablet Commonly known as:  SINGULAIR Take 10 mg by mouth every morning.   nitroGLYCERIN 0.4 MG SL tablet Commonly known as:  NITROSTAT Place 1 tablet (0.4 mg total) under the tongue every 5 (five) minutes x 3 doses as needed for chest pain.   pravastatin 40 MG tablet Commonly known as:  PRAVACHOL take 1 tablet by mouth once daily   SYMBICORT IN Inhale into the lungs. Symbicort 120/4.5 1 puff 2 x a day       Review of Systems:  Review of Systems  Constitutional: Negative for activity change, appetite change, chills and fever.  HENT: Positive for congestion and hearing loss.        Chronic sinusitis   Eyes: Negative for visual disturbance.  Respiratory: Negative for  shortness of breath.   Cardiovascular: Negative for chest pain and leg swelling.  Gastrointestinal: Negative for abdominal distention.  Genitourinary: Negative for dysuria.  Musculoskeletal: Positive for arthralgias, back pain, gait problem, joint swelling, neck pain and neck stiffness. Negative for myalgias.       Right shoulder  Skin: Negative for color change.  Neurological: Negative for dizziness and weakness.  Psychiatric/Behavioral: Positive for confusion. Negative for agitation, behavioral problems and suicidal ideas.    Health Maintenance  Topic Date Due  . TETANUS/TDAP  10/22/1956  . DEXA SCAN  10/22/2002  . INFLUENZA VACCINE  Completed  . ZOSTAVAX  Completed  . PNA vac Low Risk Adult  Completed    Physical Exam: Vitals:   10/22/16 1142  BP: 138/70  Pulse: 70  Temp: 98 F (36.7 C)    TempSrc: Oral  SpO2: 98%  Weight: 187 lb (84.8 kg)  Height: 5\' 8"  (1.727 m)   Body mass index is 28.43 kg/m. Physical Exam  Constitutional: She appears well-developed and well-nourished. No distress.  Cardiovascular: Normal rate, regular rhythm, normal heart sounds and intact distal pulses.   Pulmonary/Chest: Effort normal and breath sounds normal. No respiratory distress.  Abdominal: Soft. Bowel sounds are normal.  Musculoskeletal: Normal range of motion.  Unsteady gait, uses cane but refuses to use walker; right shoulder warm, swollen and tender on anterior aspect and deltoid area  Neurological: She is alert. She exhibits normal muscle tone.  Oriented to person, place, not time; repeats items during appt--son Clovis RileyMitchell accompanies her  Skin: Skin is warm and dry.  Psychiatric: She has a normal mood and affect.    Labs reviewed: Basic Metabolic Panel:  Recent Labs  16/06/9608/13/17 1547 05/30/16 2317 05/31/16 0550 06/18/16 1538  NA 131*  --  133* 130*  K 4.5  --  4.2 4.4  CL 95*  --  97* 94*  CO2 22  --  24 26  GLUCOSE 108*  --  113* 107*  BUN 13  --  13 11  CREATININE 0.94  --  0.88 0.77  CALCIUM 10.0  --  9.6 9.6  TSH  --  2.108  --   --    Liver Function Tests:  Recent Labs  06/18/16 1055  AST 23  ALT 14  ALKPHOS 40  BILITOT 0.7  PROT 6.6  ALBUMIN 4.1   No results for input(s): LIPASE, AMYLASE in the last 8760 hours. No results for input(s): AMMONIA in the last 8760 hours. CBC:  Recent Labs  05/30/16 1547 06/18/16 1538  WBC 5.2 4.2  NEUTROABS  --  2,184  HGB 14.3 14.1  HCT 42.5 41.7  MCV 100.2* 99.0  PLT 228 200   Lipid Panel: No results for input(s): CHOL, HDL, LDLCALC, TRIG, CHOLHDL, LDLDIRECT in the last 8760 hours. Lab Results  Component Value Date   HGBA1C 6.4 (H) 04/19/2015  Assessment/Plan 1. Chronic right shoulder pain - worse in past several weeks - DG Shoulder Right; Future - Ambulatory referral to Physical Therapy - use ice,  topicals, and tylenol up to 3g per day  2. Mild intermittent asthma without complication - cont current therapy, has not been needing albuterol but the one she has is long expired - albuterol (PROVENTIL HFA;VENTOLIN HFA) 108 (90 Base) MCG/ACT inhaler; Inhale 2 puffs into the lungs every 6 (six) hours as needed for wheezing or shortness of breath.  Dispense: 1 Inhaler; Refill: 3  3. Pure hypercholesterolemia - cont current therapy and f/u labs: - Lipid panel -  CBC with Differential/Platelet - COMPLETE METABOLIC PANEL WITH GFR  4. Essential hypertension - bp at goal with current therapy, cont to monitor, goal <140/90 - COMPLETE METABOLIC PANEL WITH GFR  5. Late onset Alzheimer's disease without behavioral disturbance -continues to progress gradually, Clovis Riley helping more with things and her husband passed away within the year also -cont aricept--will see what mmse is at annual and consider adding namenda  6. Hyponatremia - f/u labs - COMPLETE METABOLIC PANEL WITH GFR  Labs/tests ordered:   Orders Placed This Encounter  Procedures  . DG Shoulder Right    Standing Status:   Future    Standing Expiration Date:   12/20/2017    Order Specific Question:   Reason for Exam (SYMPTOM  OR DIAGNOSIS REQUIRED)    Answer:   right shoulder pain, swelling, decreased range of motion    Order Specific Question:   Preferred imaging location?    Answer:   [facility]  . Lipid panel    Order Specific Question:   Has the patient fasted?    Answer:   Yes  . CBC with Differential/Platelet  . COMPLETE METABOLIC PANEL WITH GFR    [facility]  . Ambulatory referral to Physical Therapy    Referral Priority:   Routine    Referral Type:   Physical Medicine    Referral Reason:   Specialty Services Required    Requested Specialty:   Physical Therapy    Number of Visits Requested:   1    Next appt:  01/25/2017

## 2016-10-23 ENCOUNTER — Encounter: Payer: Self-pay | Admitting: *Deleted

## 2016-10-23 LAB — LIPID PANEL
Cholesterol: 195 mg/dL (ref <200)
HDL: 106 mg/dL (ref >50)
LDL Cholesterol: 66 mg/dL (ref <100)
Total CHOL/HDL Ratio: 1.8 Ratio (ref <5.0)
Triglycerides: 113 mg/dL (ref <150)
VLDL: 23 mg/dL (ref <30)

## 2016-10-23 LAB — COMPLETE METABOLIC PANEL WITH GFR
ALT: 19 U/L (ref 6–29)
AST: 25 U/L (ref 10–35)
Albumin: 4.2 g/dL (ref 3.6–5.1)
Alkaline Phosphatase: 46 U/L (ref 33–130)
BUN: 14 mg/dL (ref 7–25)
CO2: 25 mmol/L (ref 20–31)
Calcium: 9.7 mg/dL (ref 8.6–10.4)
Chloride: 99 mmol/L (ref 98–110)
Creat: 0.8 mg/dL (ref 0.60–0.93)
GFR, Est African American: 81 mL/min (ref >=60)
GFR, Est Non African American: 70 mL/min (ref >=60)
Glucose, Bld: 91 mg/dL (ref 65–99)
Potassium: 4.9 mmol/L (ref 3.5–5.3)
Sodium: 136 mmol/L (ref 135–146)
Total Bilirubin: 0.8 mg/dL (ref 0.2–1.2)
Total Protein: 6.9 g/dL (ref 6.1–8.1)

## 2016-10-23 LAB — CBC WITH DIFFERENTIAL/PLATELET
Basophils Absolute: 0 cells/uL (ref 0–200)
Basophils Relative: 0 %
Eosinophils Absolute: 0 cells/uL — ABNORMAL LOW (ref 15–500)
Eosinophils Relative: 0 %
HCT: 42.3 % (ref 35.0–45.0)
Hemoglobin: 14.3 g/dL (ref 11.7–15.5)
Lymphocytes Relative: 40 %
Lymphs Abs: 1880 cells/uL (ref 850–3900)
MCH: 34 pg — ABNORMAL HIGH (ref 27.0–33.0)
MCHC: 33.8 g/dL (ref 32.0–36.0)
MCV: 100.5 fL — ABNORMAL HIGH (ref 80.0–100.0)
MPV: 10.2 fL (ref 7.5–12.5)
Monocytes Absolute: 423 cells/uL (ref 200–950)
Monocytes Relative: 9 %
Neutro Abs: 2397 cells/uL (ref 1500–7800)
Neutrophils Relative %: 51 %
Platelets: 234 10*3/uL (ref 140–400)
RBC: 4.21 MIL/uL (ref 3.80–5.10)
RDW: 13.8 % (ref 11.0–15.0)
WBC: 4.7 10*3/uL (ref 3.8–10.8)

## 2016-10-30 ENCOUNTER — Ambulatory Visit: Payer: PRIVATE HEALTH INSURANCE | Attending: Internal Medicine | Admitting: Physical Therapy

## 2017-01-21 ENCOUNTER — Other Ambulatory Visit: Payer: Self-pay | Admitting: Internal Medicine

## 2017-01-25 ENCOUNTER — Encounter: Payer: Self-pay | Admitting: Internal Medicine

## 2017-01-25 ENCOUNTER — Ambulatory Visit: Payer: Medicare Other | Admitting: Internal Medicine

## 2017-02-11 ENCOUNTER — Other Ambulatory Visit: Payer: Self-pay | Admitting: Internal Medicine

## 2017-04-02 ENCOUNTER — Other Ambulatory Visit: Payer: Self-pay | Admitting: Internal Medicine

## 2017-05-15 ENCOUNTER — Other Ambulatory Visit: Payer: Self-pay

## 2017-05-15 NOTE — Patient Outreach (Signed)
[facility] Care Management  05/15/2017  Earnstine Regalatricia [facility] [place] 01/11/1936 [id number]   Medication adherence call to Mrs. Lucky Cowboyatricia Stallbaumer the reason for this call is because she is showing past due under [facility]on her irbesartan 300 mg spoke to daughter in law she ask if I can call [facility] to order her medication she said she still had some in her pill box but she is due for her medication.call [facility] they have the medication ready for her.    Lillia AbedAna Ollison-Moran CPhT Pharmacy Technician [facility] Care Management Direct Dial [phone]  Fax [phone] [email or web address]

## 2017-06-20 ENCOUNTER — Other Ambulatory Visit: Payer: Self-pay | Admitting: Internal Medicine

## 2017-10-31 ENCOUNTER — Other Ambulatory Visit: Payer: Self-pay | Admitting: Internal Medicine

## 2017-11-18 ENCOUNTER — Ambulatory Visit (INDEPENDENT_AMBULATORY_CARE_PROVIDER_SITE_OTHER): Payer: Medicare Other | Admitting: Internal Medicine

## 2017-11-18 ENCOUNTER — Encounter: Payer: Self-pay | Admitting: Internal Medicine

## 2017-11-18 ENCOUNTER — Ambulatory Visit (INDEPENDENT_AMBULATORY_CARE_PROVIDER_SITE_OTHER): Payer: Medicare Other

## 2017-11-18 VITALS — BP 152/60 | HR 70 | Temp 97.4°F | Ht 68.0 in | Wt 193.0 lb

## 2017-11-18 VITALS — BP 146/70 | HR 70 | Temp 97.4°F | Ht 68.0 in | Wt 193.0 lb

## 2017-11-18 DIAGNOSIS — Z0000 Encounter for general adult medical examination without abnormal findings: Secondary | ICD-10-CM

## 2017-11-18 DIAGNOSIS — J4520 Mild intermittent asthma, uncomplicated: Secondary | ICD-10-CM

## 2017-11-18 DIAGNOSIS — E7800 Pure hypercholesterolemia, unspecified: Secondary | ICD-10-CM

## 2017-11-18 DIAGNOSIS — G301 Alzheimer's disease with late onset: Secondary | ICD-10-CM

## 2017-11-18 DIAGNOSIS — F0280 Dementia in other diseases classified elsewhere without behavioral disturbance: Secondary | ICD-10-CM | POA: Diagnosis not present

## 2017-11-18 DIAGNOSIS — B3789 Other sites of candidiasis: Secondary | ICD-10-CM

## 2017-11-18 DIAGNOSIS — I10 Essential (primary) hypertension: Secondary | ICD-10-CM

## 2017-11-18 DIAGNOSIS — E2839 Other primary ovarian failure: Secondary | ICD-10-CM

## 2017-11-18 LAB — CBC WITH DIFFERENTIAL/PLATELET
Basophils Absolute: 22 cells/uL (ref 0–200)
Basophils Relative: 0.5 %
Eosinophils Absolute: 31 cells/uL (ref 15–500)
Eosinophils Relative: 0.7 %
HCT: 42.5 % (ref 35.0–45.0)
Hemoglobin: 14.7 g/dL (ref 11.7–15.5)
Lymphs Abs: 1522 cells/uL (ref 850–3900)
MCH: 32.8 pg (ref 27.0–33.0)
MCHC: 34.6 g/dL (ref 32.0–36.0)
MCV: 94.9 fL (ref 80.0–100.0)
MPV: 10.3 fL (ref 7.5–12.5)
Monocytes Relative: 9.8 %
Neutro Abs: 2394 cells/uL (ref 1500–7800)
Neutrophils Relative %: 54.4 %
Platelets: 218 10*3/uL (ref 140–400)
RBC: 4.48 10*6/uL (ref 3.80–5.10)
RDW: 12.5 % (ref 11.0–15.0)
Total Lymphocyte: 34.6 %
WBC mixed population: 431 cells/uL (ref 200–950)
WBC: 4.4 10*3/uL (ref 3.8–10.8)

## 2017-11-18 LAB — COMPLETE METABOLIC PANEL WITH GFR
AG Ratio: 1.7 (calc) (ref 1.0–2.5)
ALT: 20 U/L (ref 6–29)
AST: 24 U/L (ref 10–35)
Albumin: 4.4 g/dL (ref 3.6–5.1)
Alkaline phosphatase (APISO): 58 U/L (ref 33–130)
BUN/Creatinine Ratio: 15 (calc) (ref 6–22)
BUN: 14 mg/dL (ref 7–25)
CO2: 26 mmol/L (ref 20–32)
Calcium: 9.8 mg/dL (ref 8.6–10.4)
Chloride: 97 mmol/L — ABNORMAL LOW (ref 98–110)
Creat: 0.96 mg/dL — ABNORMAL HIGH (ref 0.60–0.88)
GFR, Est African American: 65 mL/min/{1.73_m2} (ref >=60)
GFR, Est Non African American: 56 mL/min/{1.73_m2} — ABNORMAL LOW (ref >=60)
Globulin: 2.6 g/dL (calc) (ref 1.9–3.7)
Glucose, Bld: 100 mg/dL (ref 65–139)
Potassium: 5 mmol/L (ref 3.5–5.3)
Sodium: 132 mmol/L — ABNORMAL LOW (ref 135–146)
Total Bilirubin: 0.8 mg/dL (ref 0.2–1.2)
Total Protein: 7 g/dL (ref 6.1–8.1)

## 2017-11-18 LAB — LIPID PANEL
Cholesterol: 211 mg/dL — ABNORMAL HIGH (ref <200)
HDL: 102 mg/dL (ref >50)
LDL Cholesterol (Calc): 94 mg/dL (calc)
Non-HDL Cholesterol (Calc): 109 mg/dL (calc) (ref <130)
Total CHOL/HDL Ratio: 2.1 (calc) (ref <5.0)
Triglycerides: 67 mg/dL (ref <150)

## 2017-11-18 MED ORDER — PRAVASTATIN SODIUM 40 MG PO TABS: 40.0000 mg | ORAL_TABLET | Freq: Every day | ORAL | 3 refills | Status: DC

## 2017-11-18 MED ORDER — NYSTATIN 100000 UNIT/GM EX POWD: Freq: Every day | CUTANEOUS | 5 refills | Status: DC

## 2017-11-18 MED ORDER — AMLODIPINE BESYLATE 10 MG PO TABS: 10.0000 mg | ORAL_TABLET | Freq: Every day | ORAL | 3 refills | Status: DC

## 2017-11-18 MED ORDER — DONEPEZIL HCL 10 MG PO TABS: 10.0000 mg | ORAL_TABLET | Freq: Every day | ORAL | 3 refills | Status: DC

## 2017-11-18 MED ORDER — MONTELUKAST SODIUM 10 MG PO TABS: 10.0000 mg | ORAL_TABLET | ORAL | 3 refills | Status: DC

## 2017-11-18 MED ORDER — BUDESONIDE-FORMOTEROL FUMARATE 160-4.5 MCG/ACT IN AERO: 2.0000 | INHALATION_SPRAY | Freq: Two times a day (BID) | RESPIRATORY_TRACT | 3 refills | Status: DC

## 2017-11-18 MED ORDER — IRBESARTAN 300 MG PO TABS: 300.0000 mg | ORAL_TABLET | Freq: Every day | ORAL | 3 refills | Status: DC

## 2017-11-18 MED ORDER — ZOSTER VAC RECOMB ADJUVANTED 50 MCG/0.5ML IM SUSR: 0.5000 mL | Freq: Once | INTRAMUSCULAR | 1 refills | Status: AC

## 2017-11-18 NOTE — Progress Notes (Signed)
Provider:  Gwenith Spitziffany L. Renato Gailseed, D.O., C.M.D. Location:   [facility]  Place of Service:   clinic  Previous PCP: Kermit Baloeed, Randa Riss L, DO Patient Care Team: Kermit Baloeed, Travell Desaulniers L, DO as PCP - General (Geriatric Medicine) Chalmers GuestWhitaker, Roy, MD as Consulting Physician (Ophthalmology)  Extended Emergency Contact Information Primary Emergency Contact: Legler,[name] Address: [address]          [place], [place] [address] [place] Home Phone: [phone] Relation: Other Secondary Emergency Contact: Klutts,[name] Address: [address]          [place], [place] [address] [place] Home Phone: [phone] Relation: Son  Code Status:  Full code Goals of Care: Advanced Directive information Advanced Directives 11/18/2017  Does Patient Have a Medical Advance Directive? Yes  Type of Advance Directive Healthcare Power of Attorney  Does patient want to make changes to medical advance directive? No - Patient declined  Copy of Healthcare Power of Attorney in Chart? Yes   Chief Complaint  Patient presents with  . Annual Exam    CPE  . ACP    HCPOA    HPI: Patient is a 82 y.o. female seen today for an annual physical exam.  Discussed with patient and her son, Clovis RileyMitchell.   She continues to drink too much alcohol.  Will drink wine all through the day.  Her son reports some concern with this.  He admits he buys this for her and she gets upset with him if he limits her intake.  This has been worse since her husband's death.   She's been out of her memory medication also which has now been refilled.  Her husband passed away before our last visit.  She says she has a lot of good memories of him.  Says back is not bad.  She is not as agile as she once was.  Denies falls.    Rash under breasts bilaterally--not itchy or painful.  She says she does not look in the mirror so she doesn't even know what it is.  She's been putting powder under her breasts (baby powder).  HR 58 on  ekg, sinus bradycardia today.  No dizziness or lightheadedness.  Review of med refills indicates she has been out of over half of her meds.  Past Medical History:  Diagnosis Date  . Alzheimer disease   . Asthma   . Hyperlipidemia   . Hypertension   . Pulmonary embolism (HCC) 2004   was short of breath--was on coumadin for 6 mos to a year   Past Surgical History:  Procedure Laterality Date  . ABDOMINAL HYSTERECTOMY    . BUNIONECTOMY    . REPLACEMENT TOTAL KNEE  1980s, 2003   x2    reports that she quit smoking about 60 years ago. Her smoking use included cigarettes. She has a 1.00 pack-year smoking history. she has never used smokeless tobacco. She reports that she drinks about 25.2 oz of alcohol per week. She reports that she does not use drugs.  Functional Status Survey:  not driving lately, her son helps her with groceries and appts, lives up the street, independent in adls  Family History  Problem Relation Age of Onset  . Alzheimer's disease Mother   . Chronic bronchitis Mother   . Heart disease Father   . High blood pressure Father   . High blood pressure Brother   . Asthma Daughter   . High blood pressure  Son   . High Cholesterol Son   . Post-traumatic stress disorder Son     Health Maintenance  Topic Date Due  . DEXA SCAN  10/22/2002  . TETANUS/TDAP  09/17/2020  . INFLUENZA VACCINE  Completed  . PNA vac Low Risk Adult  Completed    No Known Allergies  Outpatient Encounter Medications as of 11/18/2017  Medication Sig  . albuterol (PROVENTIL HFA;VENTOLIN HFA) 108 (90 Base) MCG/ACT inhaler Inhale 2 puffs into the lungs every 6 (six) hours as needed for wheezing or shortness of breath.  Marland Kitchen amLODipine (NORVASC) 10 MG tablet take 1 tablet by mouth once daily  . aspirin 81 MG tablet Take 81 mg by mouth daily.    . Budesonide-Formoterol Fumarate (SYMBICORT IN) Inhale into the lungs. Symbicort 120/4.5 1 puff 2 x a day  . donepezil (ARICEPT) 10 MG tablet Take 10 mg by  mouth daily.  . irbesartan (AVAPRO) 300 MG tablet take 1 tablet by mouth once daily  . montelukast (SINGULAIR) 10 MG tablet Take 10 mg by mouth every morning.   . nitroGLYCERIN (NITROSTAT) 0.4 MG SL tablet Place 1 tablet (0.4 mg total) under the tongue every 5 (five) minutes x 3 doses as needed for chest pain.  . pravastatin (PRAVACHOL) 40 MG tablet take 1 tablet by mouth once daily  . Zoster Vaccine Adjuvanted [facility]) injection Inject 0.5 mLs into the muscle once for 1 dose.   No facility-administered encounter medications on file as of 11/18/2017.     Review of Systems  Constitutional: Negative for chills, fever and malaise/fatigue.  HENT: Negative for congestion.   Eyes: Negative for blurred vision.       Glasses  Respiratory: Negative for cough and shortness of breath.   Cardiovascular: Negative for chest pain, palpitations and leg swelling.  Gastrointestinal: Positive for constipation. Negative for abdominal pain, blood in stool, diarrhea and melena.  Genitourinary: Negative for dysuria.  Musculoskeletal: Positive for back pain. Negative for falls and joint pain.  Skin: Negative for itching and rash.  Neurological: Positive for tingling and sensory change. Negative for dizziness, loss of consciousness and weakness.  Endo/Heme/Allergies: Does not bruise/bleed easily.  Psychiatric/Behavioral: Positive for memory loss and substance abuse. Negative for depression. The patient is not nervous/anxious.        Alcohol abuse with wine    Vitals:   11/18/17 1338  BP: (!) 152/60  Pulse: 70  Temp: (!) 97.4 F (36.3 C)  TempSrc: Oral  SpO2: 97%  Weight: 193 lb (87.5 kg)  Height: 5\' 8"  (1.727 m)   Body mass index is 29.35 kg/m. Physical Exam  Constitutional: She is oriented to person, place, and time. She appears well-developed and well-nourished. No distress.  HENT:  Head: Normocephalic and atraumatic.  Right Ear: External ear normal.  Left Ear: External ear normal.    Mouth/Throat: Oropharynx is clear and moist. No oropharyngeal exudate.  Eyes: Conjunctivae and EOM are normal. Pupils are equal, round, and reactive to light.  glasses  Neck: Neck supple. No JVD present. No tracheal deviation present. No thyromegaly present.  Cardiovascular: Normal rate, regular rhythm, normal heart sounds and intact distal pulses.  Pulmonary/Chest: Effort normal and breath sounds normal. No respiratory distress. Right breast exhibits no inverted nipple, no mass, no nipple discharge, no skin change and no tenderness. Left breast exhibits no inverted nipple, no mass, no nipple discharge, no skin change and no tenderness.  Red, moist rash beneath breasts, left greater than right; also some stuck-on papules on  skin  Abdominal: Soft. Bowel sounds are normal. She exhibits no distension. There is no tenderness.  Musculoskeletal: Normal range of motion.  Stooped posture, uses cane, moves slowly  Lymphadenopathy:    She has no cervical adenopathy.  Neurological: She is alert and oriented to person, place, and time.  But poor historian and we had repeated conversations over the hour appt  Skin: Skin is warm and dry. Capillary refill takes less than 2 seconds. There is pallor.  Psychiatric: She has a normal mood and affect.    Labs reviewed: Basic Metabolic Panel: No results for input(s): NA, K, CL, CO2, GLUCOSE, BUN, CREATININE, CALCIUM, MG, PHOS in the last 8760 hours. Liver Function Tests: No results for input(s): AST, ALT, ALKPHOS, BILITOT, PROT, ALBUMIN in the last 8760 hours. No results for input(s): LIPASE, AMYLASE in the last 8760 hours. No results for input(s): AMMONIA in the last 8760 hours. CBC: No results for input(s): WBC, NEUTROABS, HGB, HCT, MCV, PLT in the last 8760 hours. Cardiac Enzymes: No results for input(s): CKTOTAL, CKMB, CKMBINDEX, TROPONINI in the last 8760 hours. BNP: Invalid input(s): POCBNP Lab Results  Component Value Date   HGBA1C 6.4 (H)  04/19/2015   Lab Results  Component Value Date   TSH 2.108 05/30/2016    Assessment/Plan 1. Annual physical exam -performed today and reviewed AWV with [place] Dec - CBC with Differential/Platelet - COMPLETE METABOLIC PANEL WITH GFR - Lipid panel  2. Essential hypertension - bp elevated, but has been out of her medications due to her dementia and not getting refills - EKG 12-Lead - COMPLETE METABOLIC PANEL WITH GFR - irbesartan (AVAPRO) 300 MG tablet; Take 1 tablet (300 mg total) by mouth daily.  Dispense: 90 tablet; Refill: 3 - amLODipine (NORVASC) 10 MG tablet; Take 1 tablet (10 mg total) by mouth daily.  Dispense: 90 tablet; Refill: 3  3. Mild intermittent asthma without complication -no recent exacerbations, cont current regimen - budesonide-formoterol (SYMBICORT) 160-4.5 MCG/ACT inhaler; Inhale 2 puffs into the lungs 2 (two) times daily. Symbicort 120/4.5 1 puff 2 x a day  Dispense: 3 Inhaler; Refill: 3 - montelukast (SINGULAIR) 10 MG tablet; Take 1 tablet (10 mg total) by mouth every morning.  Dispense: 90 tablet; Refill: 3  4. Pure hypercholesterolemia - has had difficulty with this long-term, diet not so good - COMPLETE METABOLIC PANEL WITH GFR - Lipid panel - pravastatin (PRAVACHOL) 40 MG tablet; Take 1 tablet (40 mg total) by mouth daily.  Dispense: 90 tablet; Refill: 3  5. Late onset Alzheimer's disease without behavioral disturbance -progressing on MMSE and functionally, pt has not refilled her meds regularly--appears she's been out of most for several mos and I have not gotten a call from her or her son  -not helped by loss of her husband and her wine intake being excessive -counseled her son on avoiding enabling her by purchasing the wine which puts her at increased risk of fall also - donepezil (ARICEPT) 10 MG tablet; Take 1 tablet (10 mg total) by mouth daily.  Dispense: 90 tablet; Refill: 3  6. Candidiasis of breast - nystatin (MYCOSTATIN/NYSTOP) powder; Apply  topically daily. Beneath breasts after bathing  Dispense: 60 g; Refill: 5   Labs/tests ordered: Orders Placed This Encounter  Procedures  . CBC with Differential/Platelet  . COMPLETE METABOLIC PANEL WITH GFR  . Lipid panel    Order Specific Question:   Has the patient fasted?    Answer:   No  . EKG 12-Lead  Bettylee Feig L. Aryaa Bunting, D.O. [facility] [address], [place] [address] Cell Phone (Mon-Fri 8am-5pm):  [phone] On Call:  [phone] & follow prompts after 5pm & weekends Office Phone:  [phone] Office Fax:  [phone]

## 2017-11-18 NOTE — Progress Notes (Signed)
Subjective:   Amy Andrade is a 82 y.o. female who presents for Medicare Annual (Subsequent) preventive examination.  Last AWV-06/18/2016       Objective:     Vitals: BP (!) 146/70 (BP Location: Left Arm, Patient Position: Sitting)   Pulse 70   Temp (!) 97.4 F (36.3 C) (Oral)   Ht 5\' 8"  (1.727 m)   Wt 193 lb (87.5 kg)   SpO2 97%   BMI 29.35 kg/m   Body mass index is 29.35 kg/m.  Advanced Directives 11/18/2017 06/18/2016 05/30/2016 01/20/2016 04/30/2015 03/07/2015  Does Patient Have a Medical Advance Directive? Yes Yes Yes Yes Yes Yes  Type of Sales promotion account executive of [employer] Power of [name];Living will Healthcare Power of [employer] of [place];Living will Healthcare Power of [place];Living will  Does patient want to make changes to medical advance directive? No - Patient declined - - - No - Patient declined No - Patient declined  Copy of Healthcare Power of Attorney in Chart? Yes Yes - Yes No - copy requested No - copy requested    Tobacco Social History   Tobacco Use  Smoking Status Former Smoker  . Packs/day: 0.25  . Years: 4.00  . Pack years: 1.00  . Types: Cigarettes  . Last attempt to quit: 09/17/1957  . Years since quitting: 60.2  Smokeless Tobacco Never Used     Counseling given: Not Answered   Clinical Intake:  Pre-visit preparation completed: No  Pain : No/denies pain     Nutritional Risks: None Diabetes: No  How often do you need to have someone help you when you read instructions, pamphlets, or other written materials from your doctor or pharmacy?: 1 - Never What is the last grade level you completed in school?: PHD  Interpreter Needed?: No  Information entered by :: Tyron Russell, RN  Past Medical History:  Diagnosis Date  . Alzheimer disease   . Asthma   . Hyperlipidemia   . Hypertension   . Pulmonary embolism (HCC) 2004   was short of breath--was on coumadin for 6  mos to a year   Past Surgical History:  Procedure Laterality Date  . ABDOMINAL HYSTERECTOMY    . BUNIONECTOMY    . REPLACEMENT TOTAL KNEE  1980s, 2003   x2   Family History  Problem Relation Age of Onset  . Alzheimer's disease Mother   . Chronic bronchitis Mother   . Heart disease Father   . High blood pressure Father   . High blood pressure Brother   . Asthma Daughter   . High blood pressure Son   . High Cholesterol Son   . Post-traumatic stress disorder Son    Social History   Socioeconomic History  . Marital status: Widowed    Spouse name: None  . Number of children: None  . Years of education: None  . Highest education level: None  Social Needs  . Financial resource strain: Not hard at all  . Food insecurity - worry: Never true  . Food insecurity - inability: Never true  . Transportation needs - medical: No  . Transportation needs - non-medical: No  Occupational History  . None  Tobacco Use  . Smoking status: Former Smoker    Packs/day: 0.25    Years: 4.00    Pack years: 1.00    Types: Cigarettes    Last attempt to quit: 09/17/1957    Years since quitting: 60.2  . Smokeless tobacco:  Never Used  Substance and Sexual Activity  . Alcohol use: Yes    Alcohol/week: 25.2 oz    Types: 42 Glasses of wine per week  . Drug use: No  . Sexual activity: None  Other Topics Concern  . None  Social History Narrative   Diet: Eats well      Do you drink/ eat things with caffeine?Yes, 2 cups of coffee a day      Marital status:    Married--widowed in 2017                           What year were you married ? 1966      Do you live in a house, apartment,assistred living, condo, trailer, etc.)? House      Is it one or more stories? 2      How many persons live in your home ?  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  Yes                            Type & how often: Stationary bike  2 x/week      Do you have a living will? Yes      Do you have a DNR form?  Yes                     If not, do you want to discuss one?       Do you have signed POA?HPOA forms?   Yes              If so, please bring to your        appointment       Outpatient Encounter Medications as of 11/18/2017  Medication Sig  . albuterol (PROVENTIL HFA;VENTOLIN HFA) 108 (90 Base) MCG/ACT inhaler Inhale 2 puffs into the lungs every 6 (six) hours as needed for wheezing or shortness of breath.  Marland Kitchen. amLODipine (NORVASC) 10 MG tablet take 1 tablet by mouth once daily  . aspirin 81 MG tablet Take 81 mg by mouth daily.    . Budesonide-Formoterol Fumarate (SYMBICORT IN) Inhale into the lungs. Symbicort 120/4.5 1 puff 2 x a day  . irbesartan (AVAPRO) 300 MG tablet take 1 tablet by mouth once daily  . montelukast (SINGULAIR) 10 MG tablet Take 10 mg by mouth every morning.   . nitroGLYCERIN (NITROSTAT) 0.4 MG SL tablet Place 1 tablet (0.4 mg total) under the tongue every 5 (five) minutes x 3 doses as needed for chest pain.  . pravastatin (PRAVACHOL) 40 MG tablet take 1 tablet by mouth once daily  . Zoster Vaccine Adjuvanted [facility](SHINGRIX) injection Inject 0.5 mLs into the muscle once for 1 dose.  . [DISCONTINUED] Zoster Vaccine Adjuvanted [facility](SHINGRIX) injection Inject 0.5 mLs into the muscle once.  . donepezil (ARICEPT) 10 MG tablet Take 10 mg by mouth daily.  . [DISCONTINUED] irbesartan (AVAPRO) 300 MG tablet take 1 tablet by mouth once daily  . [DISCONTINUED] pravastatin (PRAVACHOL) 40 MG tablet take 1 tablet by mouth once daily   No facility-administered encounter medications on file as of 11/18/2017.     Activities of Daily Living In your present state of health, do you have any difficulty performing the  following activities: 11/18/2017  Hearing? Y  Vision? N  Difficulty concentrating or making decisions? Y  Walking or climbing stairs? Y  Dressing or bathing? N  Doing errands, shopping? Y  Preparing Food and  eating ? N  Using the Toilet? N  In the past six months, have you accidently leaked urine? N  Do you have problems with loss of bowel control? N  Managing your Medications? Y  Managing your Finances? Y  Housekeeping or managing your Housekeeping? Y  Some recent data might be hidden    Patient Care Team: Kermit Balo, DO as PCP - General (Geriatric Medicine) Chalmers Guest, MD as Consulting Physician (Ophthalmology)    Assessment:   This is a routine wellness examination for Audianna.  Exercise Activities and Dietary recommendations Current Exercise Habits: The patient does not participate in regular exercise at present, Exercise limited by: None identified  Goals    None      Fall Risk Fall Risk  11/18/2017 10/22/2016 06/18/2016 01/20/2016 04/21/2015  Falls in the past year? No No No Yes No  Number falls in past yr: - - - 2 or more -  Injury with Fall? - - - No -   Is the patient's home free of loose throw rugs in walkways, pet beds, electrical cords, etc?   yes      Grab bars in the bathroom? yes      Handrails on the stairs?   yes      Adequate lighting?   yes  Depression Screen PHQ 2/9 Scores 11/18/2017 10/22/2016 06/18/2016 01/20/2016  PHQ - 2 Score 1 0 0 0     Cognitive Function MMSE - Mini Mental State Exam 11/18/2017 06/18/2016 04/21/2015  Orientation to time 2 3 2   Orientation to Place 5 5 5   Registration 3 3 3   Attention/ Calculation 5 5 5   Recall 1 3 1   Language- name 2 objects 2 2 2   Language- repeat 1 1 1   Language- follow 3 step command 3 3 3   Language- read & follow direction 1 1 1   Write a sentence 1 1 1   Copy design 1 1 1   Total score 25 28 25         Immunization History  Administered Date(s) Administered  . Influenza,inj,Quad PF,6+ Mos 05/27/2015, 06/18/2016  . Pneumococcal Conjugate-13 10/11/2014  . Pneumococcal Polysaccharide-23 01/20/2016  . Tdap 09/17/2010  . Zoster 07/22/2014    Qualifies for Shingles Vaccine? Yes educated and prescription sent  to pharmacy  Screening Tests Health Maintenance  Topic Date Due  . TETANUS/TDAP  10/22/1956  . DEXA SCAN  10/22/2002  . INFLUENZA VACCINE  04/17/2017  . PNA vac Low Risk Adult  Completed    Cancer Screenings: Lung: Low Dose CT Chest recommended if Age 106-80 years, 30 pack-year currently smoking OR have quit w/in 15years. Patient does not qualify. Breast:  Up to date on Mammogram? Yes   Up to date of Bone Density/Dexa? No,ordered Colorectal: up to date  Additional Screenings:  Hepatitis C Screening: declined     Plan:    I have personally reviewed and addressed the Medicare Annual Wellness questionnaire and have noted the following in the patient's chart:  A. Medical and social history B. Use of alcohol, tobacco or illicit drugs  C. Current medications and supplements D. Functional ability and status E.  Nutritional status F.  Physical activity G. Advance directives H. List of other physicians I.  Hospitalizations, surgeries, and ER visits in previous 12  months J.  Vitals K. Screenings to include hearing, vision, cognitive, depression L. Referrals and appointments - none  In addition, I have reviewed and discussed with patient certain preventive protocols, quality metrics, and best practice recommendations. A written personalized care plan for preventive services as well as general preventive health recommendations were provided to patient.  See attached scanned questionnaire for additional information.   Signed,   Tyron Russell, RN Nurse Health Advisor  Patient Concerns: none

## 2017-11-18 NOTE — Patient Instructions (Signed)
[name] , Thank you for taking time to come for your Medicare Wellness Visit. I appreciate your ongoing commitment to your health goals. Please review the following plan we discussed and let me know if I can assist you in the future.   Screening recommendations/referrals: Colonoscopy excluded, you are over age 375 Mammogram excluded, you are over age 80 Bone Density due, ordered Recommended yearly ophthalmology/optometry visit for glaucoma screening and checkup Recommended yearly dental visit for hygiene and checkup  Vaccinations: Influenza vaccine up to date, due 2019 fall season Pneumococcal vaccine up to date Tdap vaccine up to date, due 09/17/2020 Shingles vaccine due, prescription sent to pharmacy    Advanced directives: in chart  Conditions/risks identified: none  Next appointment: Amy RussellSara Righteous Claiborne, RN 11/21/2018 @ 10:45am   Preventive Care 65 Years and Older, Female Preventive care refers to lifestyle choices and visits with your health care provider that can promote health and wellness. What does preventive care include?  A yearly physical exam. This is also called an annual well check.  Dental exams once or twice a year.  Routine eye exams. Ask your health care provider how often you should have your eyes checked.  Personal lifestyle choices, including:  Daily care of your teeth and gums.  Regular physical activity.  Eating a healthy diet.  Avoiding tobacco and drug use.  Limiting alcohol use.  Practicing safe sex.  Taking low-dose aspirin every day.  Taking vitamin and mineral supplements as recommended by your health care provider. What happens during an annual well check? The services and screenings done by your health care provider during your annual well check will depend on your age, overall health, lifestyle risk factors, and family history of disease. Counseling  Your health care provider may ask you questions about your:  Alcohol use.  Tobacco  use.  Drug use.  Emotional well-being.  Home and relationship well-being.  Sexual activity.  Eating habits.  History of falls.  Memory and ability to understand (cognition).  Work and work Astronomerenvironment.  Reproductive health. Screening  You may have the following tests or measurements:  Height, weight, and BMI.  Blood pressure.  Lipid and cholesterol levels. These may be checked every 5 years, or more frequently if you are over 80 years old.  Skin check.  Lung cancer screening. You may have this screening every year starting at age 455 if you have a 30-pack-year history of smoking and currently smoke or have quit within the past 15 years.  Fecal occult blood test (FOBT) of the stool. You may have this test every year starting at age 850.  Flexible sigmoidoscopy or colonoscopy. You may have a sigmoidoscopy every 5 years or a colonoscopy every 10 years starting at age 80.  Hepatitis C blood test.  Hepatitis B blood test.  Sexually transmitted disease (STD) testing.  Diabetes screening. This is done by checking your blood sugar (glucose) after you have not eaten for a while (fasting). You may have this done every 1-3 years.  Bone density scan. This is done to screen for osteoporosis. You may have this done starting at age 80.  Mammogram. This may be done every 1-2 years. Talk to your health care provider about how often you should have regular mammograms. Talk with your health care provider about your test results, treatment options, and if necessary, the need for more tests. Vaccines  Your health care provider may recommend certain vaccines, such as:  Influenza vaccine. This is recommended every year.  Tetanus,  diphtheria, and acellular pertussis (Tdap, Td) vaccine. You may need a Td booster every 10 years.  Zoster vaccine. You may need this after age 18.  Pneumococcal 13-valent conjugate (PCV13) vaccine. One dose is recommended after age 56.  Pneumococcal  polysaccharide (PPSV23) vaccine. One dose is recommended after age 36. Talk to your health care provider about which screenings and vaccines you need and how often you need them. This information is not intended to replace advice given to you by your health care provider. Make sure you discuss any questions you have with your health care provider. Document Released: 09/30/2015 Document Revised: 05/23/2016 Document Reviewed: 07/05/2015 Elsevier Interactive Patient Education  2017 [employer] Prevention in the Home Falls can cause injuries. They can happen to people of all ages. There are many things you can do to make your home safe and to help prevent falls. What can I do on the outside of my home?  Regularly fix the edges of walkways and driveways and fix any cracks.  Remove anything that might make you trip as you walk through a door, such as a raised step or threshold.  Trim any bushes or trees on the path to your home.  Use bright outdoor lighting.  Clear any walking paths of anything that might make someone trip, such as rocks or tools.  Regularly check to see if handrails are loose or broken. Make sure that both sides of any steps have handrails.  Any raised decks and porches should have guardrails on the edges.  Have any leaves, snow, or ice cleared regularly.  Use sand or salt on walking paths during winter.  Clean up any spills in your garage right away. This includes oil or grease spills. What can I do in the bathroom?  Use night lights.  Install grab bars by the toilet and in the tub and shower. Do not use towel bars as grab bars.  Use non-skid mats or decals in the tub or shower.  If you need to sit down in the shower, use a plastic, non-slip stool.  Keep the floor dry. Clean up any water that spills on the floor as soon as it happens.  Remove soap buildup in the tub or shower regularly.  Attach bath mats securely with double-sided non-slip rug  tape.  Do not have throw rugs and other things on the floor that can make you trip. What can I do in the bedroom?  Use night lights.  Make sure that you have a light by your bed that is easy to reach.  Do not use any sheets or blankets that are too big for your bed. They should not hang down onto the floor.  Have a firm chair that has side arms. You can use this for support while you get dressed.  Do not have throw rugs and other things on the floor that can make you trip. What can I do in the kitchen?  Clean up any spills right away.  Avoid walking on wet floors.  Keep items that you use a lot in easy-to-reach places.  If you need to reach something above you, use a strong step stool that has a grab bar.  Keep electrical cords out of the way.  Do not use floor polish or wax that makes floors slippery. If you must use wax, use non-skid floor wax.  Do not have throw rugs and other things on the floor that can make you trip. What can I do with  my stairs?  Do not leave any items on the stairs.  Make sure that there are handrails on both sides of the stairs and use them. Fix handrails that are broken or loose. Make sure that handrails are as long as the stairways.  Check any carpeting to make sure that it is firmly attached to the stairs. Fix any carpet that is loose or worn.  Avoid having throw rugs at the top or bottom of the stairs. If you do have throw rugs, attach them to the floor with carpet tape.  Make sure that you have a light switch at the top of the stairs and the bottom of the stairs. If you do not have them, ask someone to add them for you. What else can I do to help prevent falls?  Wear shoes that:  Do not have high heels.  Have rubber bottoms.  Are comfortable and fit you well.  Are closed at the toe. Do not wear sandals.  If you use a stepladder:  Make sure that it is fully opened. Do not climb a closed stepladder.  Make sure that both sides of the  stepladder are locked into place.  Ask someone to hold it for you, if possible.  Clearly mark and make sure that you can see:  Any grab bars or handrails.  First and last steps.  Where the edge of each step is.  Use tools that help you move around (mobility aids) if they are needed. These include:  Canes.  Walkers.  Scooters.  Crutches.  Turn on the lights when you go into a dark area. Replace any light bulbs as soon as they burn out.  Set up your furniture so you have a clear path. Avoid moving your furniture around.  If any of your floors are uneven, fix them.  If there are any pets around you, be aware of where they are.  Review your medicines with your doctor. Some medicines can make you feel dizzy. This can increase your chance of falling. Ask your doctor what other things that you can do to help prevent falls. This information is not intended to replace advice given to you by your health care provider. Make sure you discuss any questions you have with your health care provider. Document Released: 06/30/2009 Document Revised: 02/09/2016 Document Reviewed: 10/08/2014 Elsevier Interactive Patient Education  2017 [employer].

## 2017-11-21 ENCOUNTER — Encounter: Payer: Self-pay | Admitting: *Deleted

## 2017-12-04 ENCOUNTER — Inpatient Hospital Stay: Admission: RE | Admit: 2017-12-04 | Payer: Self-pay | Source: Ambulatory Visit

## 2017-12-26 ENCOUNTER — Telehealth: Payer: Self-pay | Admitting: *Deleted

## 2017-12-26 NOTE — Telephone Encounter (Signed)
-----   Message from Kermit Baloiffany L Reed, DO sent at 12/25/2017  8:45 AM EDT ----- Pt had requested xrays of her shoulder several weeks ago at her clinic appt.  I had advised she and her son that she could walk into [facility] to get them done.  She has yet to go apparently.  Can you call her son and check on this? ----- Message ----- From: SYSTEM Sent: 12/25/2017  12:05 AM To: Kermit Baloiffany L Reed, DO

## 2017-12-26 NOTE — Telephone Encounter (Signed)
Left message on son's VM asking if pt still wants this xray, asked him to return my call.

## 2017-12-30 NOTE — Telephone Encounter (Signed)
Spoke with patient and she states she doesn't need a xray, her shoulder is better. Order deleted

## 2018-01-14 ENCOUNTER — Ambulatory Visit
Admission: RE | Admit: 2018-01-14 | Discharge: 2018-01-14 | Disposition: A | Discharge status: Home / Self Care | Payer: Medicare Other | Source: Ambulatory Visit | Attending: Internal Medicine | Admitting: Internal Medicine

## 2018-01-14 DIAGNOSIS — Z13820 Encounter for screening for osteoporosis: Secondary | ICD-10-CM | POA: Diagnosis not present

## 2018-01-14 DIAGNOSIS — E2839 Other primary ovarian failure: Secondary | ICD-10-CM

## 2018-01-14 DIAGNOSIS — Z780 Asymptomatic menopausal state: Secondary | ICD-10-CM | POA: Diagnosis not present

## 2018-01-20 ENCOUNTER — Encounter: Payer: Self-pay | Admitting: *Deleted

## 2018-03-01 ENCOUNTER — Other Ambulatory Visit: Payer: Self-pay | Admitting: Internal Medicine

## 2018-03-01 DIAGNOSIS — I10 Essential (primary) hypertension: Secondary | ICD-10-CM

## 2018-05-26 ENCOUNTER — Ambulatory Visit: Payer: Medicare Other | Admitting: Internal Medicine

## 2018-06-01 ENCOUNTER — Other Ambulatory Visit: Payer: Self-pay | Admitting: Internal Medicine

## 2018-06-01 DIAGNOSIS — I10 Essential (primary) hypertension: Secondary | ICD-10-CM

## 2018-10-06 ENCOUNTER — Telehealth: Payer: Self-pay

## 2018-10-06 NOTE — Telephone Encounter (Signed)
Patient was calling stating someone called her and she does not know why.  Amy Andrade, Dr.Reed's assistant was near me at the time of call and stated patient was contacted because she is overdue for a follow-up. Patient was last seen in 11/2017 and was instructed to schedule a 6 month follow-up (September 2019) that never took place. Patient instructed me to call her son, Amy Andrade to schedule.  I called Amy Andrade. Amy Andrade scheduled appointment for Monday 10/27/2018 @ 11:00 am. Patient needs a morning appointment due to son usually in meetings in the pm. Patient would like later morning (reason for appt date/time)   S.Chrae B/CMA

## 2018-10-27 ENCOUNTER — Ambulatory Visit (INDEPENDENT_AMBULATORY_CARE_PROVIDER_SITE_OTHER): Payer: Medicare Other | Admitting: Internal Medicine

## 2018-10-27 ENCOUNTER — Encounter: Payer: Self-pay | Admitting: Internal Medicine

## 2018-10-27 VITALS — BP 124/82 | HR 64 | Temp 97.5°F | Resp 10 | Wt 191.0 lb

## 2018-10-27 DIAGNOSIS — E785 Hyperlipidemia, unspecified: Secondary | ICD-10-CM

## 2018-10-27 DIAGNOSIS — I10 Essential (primary) hypertension: Secondary | ICD-10-CM

## 2018-10-27 DIAGNOSIS — J4520 Mild intermittent asthma, uncomplicated: Secondary | ICD-10-CM | POA: Diagnosis not present

## 2018-10-27 DIAGNOSIS — G301 Alzheimer's disease with late onset: Secondary | ICD-10-CM

## 2018-10-27 DIAGNOSIS — F1020 Alcohol dependence, uncomplicated: Secondary | ICD-10-CM

## 2018-10-27 DIAGNOSIS — F0280 Dementia in other diseases classified elsewhere without behavioral disturbance: Secondary | ICD-10-CM

## 2018-10-27 DIAGNOSIS — Z23 Encounter for immunization: Secondary | ICD-10-CM

## 2018-10-27 NOTE — Progress Notes (Signed)
[facility] Provider:  Mellany Dinsmore L. Renato Gailseed, D.O., C.M.D.  Code Status: DNR Goals of Care:  Advanced Directives 10/27/2018  Does Patient Have a Medical Advance Directive? Yes  Type of Advance Directive Healthcare Power of Attorney  Does patient want to make changes to medical advance directive? No - Patient declined  Copy of Healthcare Power of Attorney in Chart? Yes - validated most recent copy scanned in chart (See row information)   Chief Complaint  Patient presents with  . Medical Management of Chronic Issues    10 month follow-up, here with son Clovis RileyMitchell  . Immunizations    Flu Vaccine     HPI: Patient is a 83 y.o. female seen today for medical management of chronic diseases.  She reports she is doing fine.    No falls.    Back is doing fine.  It still gives he some problems.  Slows her down when it rains.    She has cut down on alcohol.  Her son has quit buying it.  She has some at dinner.  Her son says she will drink at lunch and the afternoon and then dinner.  Her son reports that more disappears than that and she must drink through the afternoon.    No sob or wheezing.  Uses her symbicort inhaler, but apparently has not needed red albuterol.    Memory:  On aricept.  Staying about the same per her son--may call him twice in a day and asks about the same thing.    Would call 911, has button and monitor to call 911 and knows to get out of the house. Cooks her breakfast--has no problems with leaving stove or coffee pot on.  Her son and his wife cook dinners.  Does not eat a true lunch--cheese and crackers, peanut butter crackers or mixed nuts of lunchmeat, part of a sub.    Given flu shot today.  Past Medical History:  Diagnosis Date  . Alzheimer disease (HCC)   . Asthma   . Hyperlipidemia   . Hypertension   . Pulmonary embolism (HCC) 2004   was short of breath--was on coumadin for 6 mos to a year    Past Surgical History:  Procedure Laterality Date  .  ABDOMINAL HYSTERECTOMY    . BUNIONECTOMY    . REPLACEMENT TOTAL KNEE  1980s, 2003   x2    No Known Allergies  Outpatient Encounter Medications as of 10/27/2018  Medication Sig  . albuterol (PROVENTIL HFA;VENTOLIN HFA) 108 (90 Base) MCG/ACT inhaler Inhale 2 puffs into the lungs every 6 (six) hours as needed for wheezing or shortness of breath.  Marland Kitchen. amLODipine (NORVASC) 10 MG tablet TAKE 1 TABLET BY MOUTH ONCE DAILY  . aspirin 81 MG tablet Take 81 mg by mouth daily.    . budesonide-formoterol (SYMBICORT) 160-4.5 MCG/ACT inhaler Inhale 2 puffs into the lungs 2 (two) times daily. Symbicort 120/4.5 1 puff 2 x a day  . donepezil (ARICEPT) 10 MG tablet Take 1 tablet (10 mg total) by mouth daily.  . irbesartan (AVAPRO) 300 MG tablet Take 1 tablet (300 mg total) by mouth daily.  . montelukast (SINGULAIR) 10 MG tablet Take 1 tablet (10 mg total) by mouth every morning.  . nitroGLYCERIN (NITROSTAT) 0.4 MG SL tablet Place 1 tablet (0.4 mg total) under the tongue every 5 (five) minutes x 3 doses as needed for chest pain.  Marland Kitchen. nystatin (MYCOSTATIN/NYSTOP) powder Apply topically daily. Beneath breasts after bathing  . pravastatin (PRAVACHOL)  40 MG tablet Take 1 tablet (40 mg total) by mouth daily.   No facility-administered encounter medications on file as of 10/27/2018.     Review of Systems:  Review of Systems  Constitutional: Negative for chills, fever, malaise/fatigue and weight loss.  HENT: Positive for hearing loss.   Eyes: Negative for blurred vision.       Glasses  Respiratory: Negative for cough and shortness of breath.   Cardiovascular: Negative for chest pain, palpitations and leg swelling.  Skin: Negative for itching and rash.    Health Maintenance  Topic Date Due  . INFLUENZA VACCINE  04/17/2018  . TETANUS/TDAP  09/17/2020  . DEXA SCAN  Completed  . PNA vac Low Risk Adult  Completed    Physical Exam: Vitals:   10/27/18 1130  BP: 124/82  Pulse: 64  Resp: 10  Temp: (!) 97.5 F  (36.4 C)  TempSrc: Oral  SpO2: 98%  Weight: 191 lb (86.6 kg)   Body mass index is 29.04 kg/m. Physical Exam Vitals signs reviewed.  Constitutional:      General: She is not in acute distress.    Appearance: Normal appearance. She is not toxic-appearing.  HENT:     Head: Normocephalic and atraumatic.  Cardiovascular:     Rate and Rhythm: Normal rate and regular rhythm.     Pulses: Normal pulses.     Heart sounds: Normal heart sounds.  Pulmonary:     Effort: Pulmonary effort is normal.     Breath sounds: Wheezing present.  Abdominal:     General: Bowel sounds are normal. There is no distension.     Palpations: Abdomen is soft. There is no mass.     Tenderness: There is no abdominal tenderness. There is no right CVA tenderness or left CVA tenderness.  Musculoskeletal: Normal range of motion.  Skin:    General: Skin is warm and dry.     Capillary Refill: Capillary refill takes less than 2 seconds.  Neurological:     General: No focal deficit present.     Mental Status: She is alert and oriented to person, place, and time.  Psychiatric:        Mood and Affect: Mood normal.        Behavior: Behavior normal.        Thought Content: Thought content normal.        Judgment: Judgment normal.     Labs reviewed: Basic Metabolic Panel: Recent Labs    11/18/17 1448  NA 132*  K 5.0  CL 97*  CO2 26  GLUCOSE 100  BUN 14  CREATININE 0.96*  CALCIUM 9.8   Liver Function Tests: Recent Labs    11/18/17 1448  AST 24  ALT 20  BILITOT 0.8  PROT 7.0   No results for input(s): LIPASE, AMYLASE in the last 8760 hours. No results for input(s): AMMONIA in the last 8760 hours. CBC: Recent Labs    11/18/17 1448  WBC 4.4  NEUTROABS 2,394  HGB 14.7  HCT 42.5  MCV 94.9  PLT 218   Lipid Panel: Recent Labs    11/18/17 1448  CHOL 211*  HDL 102  LDLCALC 94  TRIG 67  CHOLHDL 2.1   Lab Results  Component Value Date   HGBA1C 6.4 (H) 04/19/2015    Procedures since last  visit: No results found.  Assessment/Plan 1. Need for influenza vaccination - Flu vaccine HIGH DOSE PF (Fluzone High dose) was given today  2. Uncomplicated alcohol dependence (HCC) -  continues to drink more than recommended daily wine--again educated pt and her son that she should certainly not be drinking more than 2 glasses of wine per day, preferably just one - her son notes that she is having more than that based on what he has to buy for her, but there has been an improvement since last visit - CBC with Differential/Platelet - COMPLETE METABOLIC PANEL WITH GFR  3. Essential hypertension -bp is well controlled w/o dizziness, cont same regimen and monitor  4. Mild intermittent asthma without complication -reportedly is using her scheduled inhaler and has not needed her albuterol rescue inhaler -has some wheezing suggesting that the symbicort does get missed (not in pillbox like other meds of course)  5. Hyperlipidemia, unspecified hyperlipidemia type - continues on pravachol, f/u lab when fasting - Lipid panel  6. Late onset Alzheimer's disease without behavioral disturbance (HCC) -cont aricept which she is tolerating well -will need mmse at [facility] to see where this stands -seems stable w/o any new changes with safety--aware to push her button for life alert or call 911 if a fire were in her home and has not left stove or coffee pot on, no wandering and no falls with injury -son drives her and fills pillbox and gets groceries, he and his wife supply most meals also  Labs/tests ordered:   Orders Placed This Encounter  Procedures  . Flu vaccine HIGH DOSE PF (Fluzone High dose)  . CBC with Differential/Platelet  . COMPLETE METABOLIC PANEL WITH GFR  . Lipid panel    Next appt:  11/21/2018   Kelden Lavallee L. Euleta Belson, D.O. Geriatrics [facility] [address] [place], [place] [address] Cell Phone (Mon-Fri 8am-5pm):  [phone] On Call:  [phone] &  follow prompts after 5pm & weekends Office Phone:  [phone] Office Fax:  [phone]

## 2018-10-28 LAB — COMPLETE METABOLIC PANEL WITH GFR
AG Ratio: 1.6 (calc) (ref 1.0–2.5)
ALT: 21 U/L (ref 6–29)
AST: 24 U/L (ref 10–35)
Albumin: 4.3 g/dL (ref 3.6–5.1)
Alkaline phosphatase (APISO): 51 U/L (ref 37–153)
BUN: 12 mg/dL (ref 7–25)
CO2: 25 mmol/L (ref 20–32)
Calcium: 10 mg/dL (ref 8.6–10.4)
Chloride: 96 mmol/L — ABNORMAL LOW (ref 98–110)
Creat: 0.86 mg/dL (ref 0.60–0.88)
GFR, Est African American: 73 mL/min/{1.73_m2} (ref >=60)
GFR, Est Non African American: 63 mL/min/{1.73_m2} (ref >=60)
Globulin: 2.7 g/dL (calc) (ref 1.9–3.7)
Glucose, Bld: 96 mg/dL (ref 65–99)
Potassium: 5.2 mmol/L (ref 3.5–5.3)
Sodium: 132 mmol/L — ABNORMAL LOW (ref 135–146)
Total Bilirubin: 0.9 mg/dL (ref 0.2–1.2)
Total Protein: 7 g/dL (ref 6.1–8.1)

## 2018-10-28 LAB — CBC WITH DIFFERENTIAL/PLATELET
Absolute Monocytes: 476 cells/uL (ref 200–950)
Basophils Absolute: 21 cells/uL (ref 0–200)
Basophils Relative: 0.5 %
Eosinophils Absolute: 21 cells/uL (ref 15–500)
Eosinophils Relative: 0.5 %
HCT: 42.1 % (ref 35.0–45.0)
Hemoglobin: 14.6 g/dL (ref 11.7–15.5)
Lymphs Abs: 1427 cells/uL (ref 850–3900)
MCH: 34 pg — ABNORMAL HIGH (ref 27.0–33.0)
MCHC: 34.7 g/dL (ref 32.0–36.0)
MCV: 97.9 fL (ref 80.0–100.0)
MPV: 10.4 fL (ref 7.5–12.5)
Monocytes Relative: 11.6 %
Neutro Abs: 2157 cells/uL (ref 1500–7800)
Neutrophils Relative %: 52.6 %
Platelets: 220 10*3/uL (ref 140–400)
RBC: 4.3 10*6/uL (ref 3.80–5.10)
RDW: 12.4 % (ref 11.0–15.0)
Total Lymphocyte: 34.8 %
WBC: 4.1 10*3/uL (ref 3.8–10.8)

## 2018-10-28 LAB — LIPID PANEL
Cholesterol: 185 mg/dL (ref <200)
HDL: 94 mg/dL (ref >=50)
LDL Cholesterol (Calc): 77 mg/dL (calc)
Non-HDL Cholesterol (Calc): 91 mg/dL (calc) (ref <130)
Total CHOL/HDL Ratio: 2 (calc) (ref <5.0)
Triglycerides: 59 mg/dL (ref <150)

## 2018-10-30 ENCOUNTER — Encounter: Payer: Self-pay | Admitting: *Deleted

## 2018-11-21 ENCOUNTER — Ambulatory Visit: Payer: Self-pay

## 2018-11-21 ENCOUNTER — Encounter: Payer: Self-pay | Admitting: Family

## 2018-11-26 ENCOUNTER — Other Ambulatory Visit: Payer: Self-pay | Admitting: Internal Medicine

## 2018-11-26 DIAGNOSIS — E7800 Pure hypercholesterolemia, unspecified: Secondary | ICD-10-CM

## 2018-11-26 DIAGNOSIS — I10 Essential (primary) hypertension: Secondary | ICD-10-CM

## 2019-01-11 ENCOUNTER — Encounter: Payer: Self-pay | Admitting: Internal Medicine

## 2019-01-31 ENCOUNTER — Other Ambulatory Visit: Payer: Self-pay | Admitting: Internal Medicine

## 2019-01-31 DIAGNOSIS — I10 Essential (primary) hypertension: Secondary | ICD-10-CM

## 2019-03-16 ENCOUNTER — Other Ambulatory Visit: Payer: Self-pay

## 2019-03-16 NOTE — Patient Outreach (Signed)
[facility]) Care Management  03/16/2019  Amy Andrade [facility] 02/04/1936 [id number]   Medication Adherence call to Amy Andrade [facility] Identifiers Verify spoke with patient she is due on Irbesartan 300 mg and Pravastatin 40 mg patient explain her daughter in law fix her pill box every week and has medication at this time. Amy Andrade is showing past due under [facility] ins.   [facility] Management Direct Dial [phone]  Fax [phone] [email or web address]

## 2019-04-27 ENCOUNTER — Ambulatory Visit: Payer: Medicare Other | Admitting: Family

## 2019-04-28 ENCOUNTER — Other Ambulatory Visit: Payer: Self-pay

## 2019-04-28 ENCOUNTER — Ambulatory Visit (INDEPENDENT_AMBULATORY_CARE_PROVIDER_SITE_OTHER): Payer: Medicare Other | Admitting: Family

## 2019-04-28 ENCOUNTER — Encounter: Payer: Self-pay | Admitting: Family

## 2019-04-28 VITALS — BP 110/70 | HR 56 | Temp 97.6°F | Ht 68.0 in | Wt 180.4 lb

## 2019-04-28 DIAGNOSIS — J4520 Mild intermittent asthma, uncomplicated: Secondary | ICD-10-CM | POA: Diagnosis not present

## 2019-04-28 DIAGNOSIS — I10 Essential (primary) hypertension: Secondary | ICD-10-CM | POA: Diagnosis not present

## 2019-04-28 DIAGNOSIS — F0280 Dementia in other diseases classified elsewhere without behavioral disturbance: Secondary | ICD-10-CM

## 2019-04-28 DIAGNOSIS — G301 Alzheimer's disease with late onset: Secondary | ICD-10-CM

## 2019-04-28 DIAGNOSIS — E785 Hyperlipidemia, unspecified: Secondary | ICD-10-CM | POA: Diagnosis not present

## 2019-04-28 DIAGNOSIS — H6122 Impacted cerumen, left ear: Secondary | ICD-10-CM

## 2019-04-28 DIAGNOSIS — L602 Onychogryphosis: Secondary | ICD-10-CM

## 2019-04-28 LAB — COMPLETE METABOLIC PANEL WITH GFR
AG Ratio: 1.6 (calc) (ref 1.0–2.5)
ALT: 19 U/L (ref 6–29)
AST: 25 U/L (ref 10–35)
Albumin: 4.4 g/dL (ref 3.6–5.1)
Alkaline phosphatase (APISO): 51 U/L (ref 37–153)
BUN/Creatinine Ratio: 15 (calc) (ref 6–22)
BUN: 15 mg/dL (ref 7–25)
CO2: 22 mmol/L (ref 20–32)
Calcium: 9.8 mg/dL (ref 8.6–10.4)
Chloride: 98 mmol/L (ref 98–110)
Creat: 1.01 mg/dL — ABNORMAL HIGH (ref 0.60–0.88)
GFR, Est African American: 60 mL/min/{1.73_m2} (ref >=60)
GFR, Est Non African American: 52 mL/min/{1.73_m2} — ABNORMAL LOW (ref >=60)
Globulin: 2.7 g/dL (calc) (ref 1.9–3.7)
Glucose, Bld: 95 mg/dL (ref 65–99)
Potassium: 5.2 mmol/L (ref 3.5–5.3)
Sodium: 133 mmol/L — ABNORMAL LOW (ref 135–146)
Total Bilirubin: 1.1 mg/dL (ref 0.2–1.2)
Total Protein: 7.1 g/dL (ref 6.1–8.1)

## 2019-04-28 LAB — LIPID PANEL
Cholesterol: 182 mg/dL (ref <200)
HDL: 83 mg/dL (ref >=50)
LDL Cholesterol (Calc): 84 mg/dL (calc)
Non-HDL Cholesterol (Calc): 99 mg/dL (calc) (ref <130)
Total CHOL/HDL Ratio: 2.2 (calc) (ref <5.0)
Triglycerides: 66 mg/dL (ref <150)

## 2019-04-28 LAB — CBC WITH DIFFERENTIAL/PLATELET
Absolute Monocytes: 412 cells/uL (ref 200–950)
Basophils Absolute: 21 cells/uL (ref 0–200)
Basophils Relative: 0.5 %
Eosinophils Absolute: 8 cells/uL — ABNORMAL LOW (ref 15–500)
Eosinophils Relative: 0.2 %
HCT: 44.6 % (ref 35.0–45.0)
Hemoglobin: 15.1 g/dL (ref 11.7–15.5)
Lymphs Abs: 1495 cells/uL (ref 850–3900)
MCH: 32.8 pg (ref 27.0–33.0)
MCHC: 33.9 g/dL (ref 32.0–36.0)
MCV: 96.7 fL (ref 80.0–100.0)
MPV: 10.3 fL (ref 7.5–12.5)
Monocytes Relative: 9.8 %
Neutro Abs: 2264 cells/uL (ref 1500–7800)
Neutrophils Relative %: 53.9 %
Platelets: 204 10*3/uL (ref 140–400)
RBC: 4.61 10*6/uL (ref 3.80–5.10)
RDW: 12.4 % (ref 11.0–15.0)
Total Lymphocyte: 35.6 %
WBC: 4.2 10*3/uL (ref 3.8–10.8)

## 2019-04-28 LAB — TSH: TSH: 1.55 mIU/L (ref 0.40–4.50)

## 2019-04-28 MED ORDER — DEBROX 6.5 % OT SOLN: 5.0000 [drp] | Freq: Two times a day (BID) | OTIC | 0 refills | Status: AC

## 2019-04-28 NOTE — Progress Notes (Signed)
Provider: Dinah Ngetich FNP-C   Gayland Curry, DO  Patient Care Team: Gayland Curry, DO as PCP - General (Geriatric Medicine) Marylynn Pearson, MD as Consulting Physician (Ophthalmology)  Extended Emergency Contact Information Primary Emergency Contact: Espinoza,[name] Address: [address] [place], [place] [address] [name] of [place] Phone: [phone] Relation: Other Secondary Emergency Contact: Jock,[name] Address: [place], [place] [address] [name] of [place] Phone: [phone] Relation: Son  Code Status: Full Code  Goals of care: Advanced Directive information Advanced Directives 04/28/2019  Does Patient Have a Medical Advance Directive? Yes  Type of Advance Directive [place]  Does patient want to make changes to medical advance directive? No - Patient declined  Copy of [place] in Chart? Yes - validated most recent copy scanned in chart (See row information)     Chief Complaint  Patient presents with  . Medical Management of Chronic Issues    6 month follow up     HPI:  Pt is a 83 y.o. female seen today 6 months follow up for medical management of chronic diseases.she is here escorted by her son.Patient's son states she lives by her self but son visit everyday in the evening and assist with meals.she makes own breakfast in the morning.son states patient just picks on food at times and does not eat anymore.she has had a weight loss of 10 lbs over five months.  Dementia - No new behavioral issues reported.off Aricept.she does own ADL's except family assist with meals preparation.  Hypertension - currently on irbesartan 300 mg tablet daily and amlodipine 10 mg tablet daily.On ASA and statin.Has Nitro SL for chest pain.she has not required any Nitro.she denies any headache,dizziness,chest pain or shortness of breath.   Hyperlipidemia - on Pravachol 40 mg tablet  daily. Previous labs reviewed Chol 185,HDL 94,TRG 59 and LDL 77 (10/27/2018).she denies any muscle aches or weakness.  Asthma - states symptoms controlled on albuterol and Symbicort.    Past Medical History:  Diagnosis Date  . Alzheimer disease ([place])   . Asthma   . Hyperlipidemia   . Hypertension   . Pulmonary embolism ([place]) 2004   was short of breath--was on coumadin for 6 mos to a year   Past Surgical History:  Procedure Laterality Date  . ABDOMINAL HYSTERECTOMY    . BUNIONECTOMY    . REPLACEMENT TOTAL KNEE  1980s, 2003   x2    No Known Allergies  Allergies as of 04/28/2019   No Known Allergies     Medication List       Accurate as of April 28, 2019 11:20 AM. If you have any questions, ask your nurse or doctor.        STOP taking these medications   donepezil 10 MG tablet Commonly known as: ARICEPT Stopped by: Sandrea Hughs, NP   montelukast 10 MG tablet Commonly known as: SINGULAIR Stopped by: Sandrea Hughs, NP   nystatin powder Commonly known as: MYCOSTATIN/NYSTOP Stopped by: Sandrea Hughs, NP     TAKE these medications   albuterol 108 (90 Base) MCG/ACT inhaler Commonly known as: VENTOLIN HFA Inhale 2 puffs into the lungs every 6 (six) hours as needed for wheezing or shortness of breath.   amLODipine 10 MG tablet Commonly known as: NORVASC TAKE 1  TABLET BY MOUTH ONCE DAILY   aspirin 81 MG tablet Take 81 mg by mouth daily.   budesonide-formoterol 160-4.5 MCG/ACT inhaler Commonly known as: Symbicort Inhale 2 puffs into the lungs 2 (two) times daily. Symbicort 120/4.5 1 puff 2 x a day   irbesartan 300 MG tablet Commonly known as: AVAPRO TAKE 1 TABLET(300 MG) BY MOUTH DAILY   nitroGLYCERIN 0.4 MG SL tablet Commonly known as: NITROSTAT Place 1 tablet (0.4 mg total) under the tongue every 5 (five) minutes x 3 doses as needed for chest pain.   pravastatin 40 MG tablet Commonly known as: PRAVACHOL TAKE 1 TABLET(40 MG) BY MOUTH DAILY        Review of Systems  Constitutional: Negative for appetite change, chills, fatigue and fever.  HENT: Negative for congestion, hearing loss, rhinorrhea, sinus pressure, sinus pain, sneezing, sore throat and trouble swallowing.   Eyes: Positive for visual disturbance. Negative for pain, discharge, redness and itching.       Wears eye glasses   Respiratory: Negative for cough, chest tightness, shortness of breath and wheezing.   Cardiovascular: Negative for chest pain, palpitations and leg swelling.  Gastrointestinal: Negative for abdominal distention, abdominal pain, constipation, diarrhea, nausea and vomiting.  Endocrine: Negative for cold intolerance, heat intolerance, polydipsia, polyphagia and polyuria.  Genitourinary: Negative for difficulty urinating, dysuria, flank pain, frequency and urgency.  Musculoskeletal: Positive for arthralgias, back pain and gait problem.       Chronic Arthritic changes on fingers   Skin: Negative for color change, pallor, rash and wound.  Neurological: Negative for dizziness, weakness, light-headedness, numbness and headaches.  Hematological: Does not bruise/bleed easily.  Psychiatric/Behavioral: Negative for agitation and sleep disturbance. The patient is not nervous/anxious.     Immunization History  Administered Date(s) Administered  . Influenza, High Dose Seasonal PF 10/27/2018  . Influenza,inj,Quad PF,6+ Mos 05/27/2015, 06/18/2016  . Influenza-Unspecified 05/18/2017  . Pneumococcal Conjugate-13 10/11/2014  . Pneumococcal Polysaccharide-23 01/20/2016  . Tdap 09/17/2010  . Zoster 07/22/2014   Pertinent  Health Maintenance Due  Topic Date Due  . INFLUENZA VACCINE  04/18/2019  . DEXA SCAN  Completed  . PNA vac Low Risk Adult  Completed   Fall Risk  04/28/2019 10/27/2018 11/18/2017 10/22/2016 06/18/2016  Falls in the past year? 0 0 No No No  Number falls in past yr: 0 0 - - -  Injury with Fall? 0 0 - - -    Vitals:   04/28/19 1053  BP: 110/70   Pulse: (!) 56  Temp: 97.6 F (36.4 C)  TempSrc: Oral  SpO2: 97%  Weight: 180 lb 6.4 oz (81.8 kg)  Height: 5' 8" (1.727 m)   Body mass index is 27.43 kg/m. Physical Exam Vitals signs reviewed.  Constitutional:      General: She is not in acute distress.    Appearance: She is overweight. She is not ill-appearing.  HENT:     Head: Normocephalic.     Right Ear: Tympanic membrane and ear canal normal. There is no impacted cerumen.     Left Ear: There is impacted cerumen.     Nose: Nose normal. No congestion or rhinorrhea.     Mouth/Throat:     Mouth: Mucous membranes are moist.     Pharynx: Oropharynx is clear. No oropharyngeal exudate or posterior oropharyngeal erythema.  Eyes:     General: No scleral icterus.       Right eye: No discharge.        Left eye: No discharge.  Extraocular Movements: Extraocular movements intact.     Conjunctiva/sclera: Conjunctivae normal.     Pupils: Pupils are equal, round, and reactive to light.  Neck:     Musculoskeletal: Normal range of motion. No neck rigidity or muscular tenderness.     Vascular: No carotid bruit.  Cardiovascular:     Rate and Rhythm: Normal rate and regular rhythm.     Pulses: Normal pulses.     Heart sounds: Normal heart sounds. No murmur. No friction rub. No gallop.   Pulmonary:     Effort: Pulmonary effort is normal. No respiratory distress.     Breath sounds: Normal breath sounds. No wheezing, rhonchi or rales.  Chest:     Chest wall: No tenderness.  Abdominal:     General: Bowel sounds are normal. There is no distension.     Palpations: Abdomen is soft. There is no mass.     Tenderness: There is no abdominal tenderness. There is no right CVA tenderness, left CVA tenderness, guarding or rebound.  Musculoskeletal:        General: No swelling or tenderness.     Right lower leg: No edema.     Left lower leg: No edema.     Comments: Moves x 4 extremities chronic arthritic changes to fingers worst on right middle  finger.unsteady gait.bilateral lower extremities overgrown thick toenails.     Lymphadenopathy:     Cervical: No cervical adenopathy.  Skin:    General: Skin is warm and dry.     Coloration: Skin is not pale.     Findings: No bruising, erythema or rash.  Neurological:     Mental Status: She is alert. Mental status is at baseline.     Sensory: No sensory deficit.     Motor: No weakness.     Coordination: Coordination normal.     Gait: Gait abnormal.  Psychiatric:        Mood and Affect: Mood normal.        Speech: Speech normal.        Behavior: Behavior normal.        Thought Content: Thought content normal.        Judgment: Judgment normal.    Labs reviewed: Recent Labs    10/27/18 1216  NA 132*  K 5.2  CL 96*  CO2 25  GLUCOSE 96  BUN 12  CREATININE 0.86  CALCIUM 10.0   Recent Labs    10/27/18 1216  AST 24  ALT 21  BILITOT 0.9  PROT 7.0   Recent Labs    10/27/18 1216  WBC 4.1  NEUTROABS 2,157  HGB 14.6  HCT 42.1  MCV 97.9  PLT 220   Lab Results  Component Value Date   TSH 2.108 05/30/2016   Lab Results  Component Value Date   HGBA1C 6.4 (H) 04/19/2015   Lab Results  Component Value Date   CHOL 185 10/27/2018   HDL 94 10/27/2018   LDLCALC 77 10/27/2018   TRIG 59 10/27/2018   CHOLHDL 2.0 10/27/2018    Significant Diagnostic Results in last 30 days:  No results found.  Assessment/Plan 1. Essential hypertension B/p at goal.Patient's son encouraged to check her blood pressure and record then bring to visit for evaluation if remains stable may need medication reduction. On ASA and Statin for cardiovascular event prevention.On Nitro for chest pain.Latest CR at normal baseline.  - CBC with Differential/Platelets - CMP with eGFR([facility]) - TSH  2. Hyperlipidemia, unspecified hyperlipidemia type LDL at  goal (10/27/2018).Continue on Pravachol 40 mg tablet daily. - Lipid Panel  3. Mild intermittent asthma without complication Symptoms under  control.continue on albuterol and Symbicort.  4. Late onset Alzheimer's disease without behavioral disturbance ([place]) No new behavioral issues.continues to do her own activities of living.has family support.No fall episode.  5. Overgrown Toenails Bilateral overgrown thick toenails. Refer to Podiatrist for evaluation.  Family/ staff Communication: Reviewed plan of care with patient and facility Nurse supervisor   Labs/tests ordered:  - CBC with Differential/Platelets - CMP with eGFR([facility]) - TSH - Lipid Panel  Dinah C Ngetich, NP

## 2019-05-04 ENCOUNTER — Ambulatory Visit: Payer: Medicare Other | Admitting: Family

## 2019-05-07 ENCOUNTER — Ambulatory Visit: Payer: Medicare Other | Admitting: Podiatry

## 2019-05-15 ENCOUNTER — Encounter: Payer: Self-pay | Admitting: Podiatry

## 2019-05-15 ENCOUNTER — Other Ambulatory Visit: Payer: Self-pay

## 2019-05-15 ENCOUNTER — Ambulatory Visit (INDEPENDENT_AMBULATORY_CARE_PROVIDER_SITE_OTHER): Payer: Medicare Other | Admitting: Podiatry

## 2019-05-15 VITALS — BP 149/75

## 2019-05-15 DIAGNOSIS — L84 Corns and callosities: Secondary | ICD-10-CM

## 2019-05-15 DIAGNOSIS — M79674 Pain in right toe(s): Secondary | ICD-10-CM

## 2019-05-15 DIAGNOSIS — M79675 Pain in left toe(s): Secondary | ICD-10-CM

## 2019-05-15 DIAGNOSIS — M2042 Other hammer toe(s) (acquired), left foot: Secondary | ICD-10-CM

## 2019-05-15 DIAGNOSIS — R600 Localized edema: Secondary | ICD-10-CM | POA: Diagnosis not present

## 2019-05-15 DIAGNOSIS — B351 Tinea unguium: Secondary | ICD-10-CM

## 2019-05-15 DIAGNOSIS — M2041 Other hammer toe(s) (acquired), right foot: Secondary | ICD-10-CM | POA: Diagnosis not present

## 2019-05-15 NOTE — Patient Instructions (Signed)
Hammer Toe  Hammer toe is a change in the shape (a deformity) of your toe. The deformity causes the middle joint of your toe to stay bent. This causes pain, especially when you are wearing shoes. Hammer toe starts gradually. At first, the toe can be straightened. Gradually over time, the deformity becomes stiff and permanent. Early treatments to keep the toe straight may relieve pain. As the deformity becomes stiff and permanent, surgery may be needed to straighten the toe. What are the causes? Hammer toe is caused by abnormal bending of the toe joint that is closest to your foot. It happens gradually over time. This pulls on the muscles and connections (tendons) of the toe joint, making them weak and stiff. It is often related to wearing shoes that are too short or narrow and do not let your toes straighten. What increases the risk? You may be at greater risk for hammer toe if you:  Are female.  Are older.  Wear shoes that are too small.  Wear high-heeled shoes that pinch your toes.  Are a ballet dancer.  Have a second toe that is longer than your big toe (first toe).  Injure your foot or toe.  Have arthritis.  Have a family history of hammer toe.  Have a nerve or muscle disorder. What are the signs or symptoms? The main symptoms of this condition are pain and deformity of the toe. The pain is worse when wearing shoes, walking, or running. Other symptoms may include:  Corns or calluses over the bent part of the toe or between the toes.  Redness and a burning feeling on the toe.  An open sore that forms on the top of the toe.  Not being able to straighten the toe. How is this diagnosed? This condition is diagnosed based on your symptoms and a physical exam. During the exam, your health care provider will try to straighten your toe to see how stiff the deformity is. You may also have tests, such as:  A blood test to check for rheumatoid arthritis.  An X-ray to show how  severe the deformity is. How is this treated? Treatment for this condition will depend on how stiff the deformity is. Surgery is often needed. However, sometimes a hammer toe can be straightened without surgery. Treatments that do not involve surgery include:  Taping the toe into a straightened position.  Using pads and cushions to protect the toe (orthotics).  Wearing shoes that provide enough room for the toes.  Doing toe-stretching exercises at home.  Taking an NSAID to reduce pain and swelling. If these treatments do not help or the toe cannot be straightened, surgery is the next option. The most common surgeries used to straighten a hammer toe include:  Arthroplasty. In this procedure, part of the joint is removed, and that allows the toe to straighten.  Fusion. In this procedure, cartilage between the two bones of the joint is taken out and the bones are fused together into one longer bone.  Implantation. In this procedure, part of the bone is removed and replaced with an implant to let the toe move again.  Flexor tendon transfer. In this procedure, the tendons that curl the toes down (flexor tendons) are repositioned. Follow these instructions at home:  Take over-the-counter and prescription medicines only as told by your health care provider.  Do toe straightening and stretching exercises as told by your health care provider.  Keep all follow-up visits as told by your health care   provider. This is important. How is this prevented?  Wear shoes that give your toes enough room and do not cause pain.  Do not wear high-heeled shoes. Contact a health care provider if:  Your pain gets worse.  Your toe becomes red or swollen.  You develop an open sore on your toe. This information is not intended to replace advice given to you by your health care provider. Make sure you discuss any questions you have with your health care provider. Document Released: 08/31/2000 Document  Revised: 08/16/2017 Document Reviewed: 12/28/2015 Elsevier Patient Education  [place]? An infection that lies within the keratin of your nail plate that is caused by a fungus.  WHY ME? Fungal infections affect all ages, sexes, races, and creeds.  There may be many factors that predispose you to a fungal infection such as age, coexisting medical conditions such as diabetes, or an autoimmune disease; stress, medications, fatigue, genetics, etc.  Bottom line: fungus thrives in a warm, moist environment and your shoes offer such a location.  IS IT CONTAGIOUS? Theoretically, yes.  You do not want to share shoes, nail clippers or files with someone who has fungal toenails.  Walking around barefoot in the same room or sleeping in the same bed is unlikely to transfer the organism.  It is important to realize, however, that fungus can spread easily from one nail to the next on the same foot.  HOW DO WE TREAT THIS?  There are several ways to treat this condition.  Treatment may depend on many factors such as age, medications, pregnancy, liver and kidney conditions, etc.  It is best to ask your doctor which options are available to you.  1. No treatment.   Unlike many other medical concerns, you can live with this condition.  However for many people this can be a painful condition and may lead to ingrown toenails or a bacterial infection.  It is recommended that you keep the nails cut short to help reduce the amount of fungal nail. 2. Topical treatment.  These range from herbal remedies to prescription strength nail lacquers.  About 40-50% effective, topicals require twice daily application for approximately 9 to 12 months or until an entirely new nail has grown out.  The most effective topicals are medical grade medications available through physicians offices. 3. Oral antifungal medications.  With an 80-90% cure rate, the most common oral medication requires  3 to 4 months of therapy and stays in your system for a year as the new nail grows out.  Oral antifungal medications do require blood work to make sure it is a safe drug for you.  A liver function panel will be performed prior to starting the medication and after the first month of treatment.  It is important to have the blood work performed to avoid any harmful side effects.  In general, this medication safe but blood work is required. 4. Laser Therapy.  This treatment is performed by applying a specialized laser to the affected nail plate.  This therapy is noninvasive, fast, and non-painful.  It is not covered by insurance and is therefore, out of pocket.  The results have been very good with a 80-95% cure rate.  [facility] is the only practice in the area to offer this therapy. 5. Permanent Nail Avulsion.  Removing the entire nail so that a new nail will not grow back.  Corns and Calluses Corns are small areas of  thickened skin that occur on the top, sides, or tip of a toe. They contain a cone-shaped core with a point that can press on a nerve below. This causes pain.  Calluses are areas of thickened skin that can occur anywhere on the body, including the hands, fingers, palms, soles of the feet, and heels. Calluses are usually larger than corns. What are the causes? Corns and calluses are caused by rubbing (friction) or pressure, such as from shoes that are too tight or do not fit properly. What increases the risk? Corns are more likely to develop in people who have misshapen toes (toe deformities), such as hammer toes. Calluses can occur with friction to any area of the skin. They are more likely to develop in people who:  Work with their hands.  Wear shoes that fit poorly, are too tight, or are high-heeled.  Have toe deformities. What are the signs or symptoms? Symptoms of a corn or callus include:  A hard growth on the skin.  Pain or tenderness under the skin.  Redness and  swelling.  Increased discomfort while wearing tight-fitting shoes, if your feet are affected. If a corn or callus becomes infected, symptoms may include:  Redness and swelling that gets worse.  Pain.  Fluid, blood, or pus draining from the corn or callus. How is this diagnosed? Corns and calluses may be diagnosed based on your symptoms, your medical history, and a physical exam. How is this treated? Treatment for corns and calluses may include:  Removing the cause of the friction or pressure. This may involve: ? Changing your shoes. ? Wearing shoe inserts (orthotics) or other protective layers in your shoes, such as a corn pad. ? Wearing gloves.  Applying medicine to the skin (topical medicine) to help soften skin in the hardened, thickened areas.  Removing layers of dead skin with a file to reduce the size of the corn or callus.  Removing the corn or callus with a scalpel or laser.  Taking antibiotic medicines, if your corn or callus is infected.  Having surgery, if a toe deformity is the cause. Follow these instructions at home:   Take over-the-counter and prescription medicines only as told by your health care provider.  If you were prescribed an antibiotic, take it as told by your health care provider. Do not stop taking it even if your condition starts to improve.  Wear shoes that fit well. Avoid wearing high-heeled shoes and shoes that are too tight or too loose.  Wear any padding, protective layers, gloves, or orthotics as told by your health care provider.  Soak your hands or feet and then use a file or pumice stone to soften your corn or callus. Do this as told by your health care provider.  Check your corn or callus every day for symptoms of infection. Contact a health care provider if you:  Notice that your symptoms do not improve with treatment.  Have redness or swelling that gets worse.  Notice that your corn or callus becomes painful.  Have fluid,  blood, or pus coming from your corn or callus.  Have new symptoms. Summary  Corns are small areas of thickened skin that occur on the top, sides, or tip of a toe.  Calluses are areas of thickened skin that can occur anywhere on the body, including the hands, fingers, palms, and soles of the feet. Calluses are usually larger than corns.  Corns and calluses are caused by rubbing (friction) or pressure, such as from  shoes that are too tight or do not fit properly.  Treatment may include wearing any padding, protective layers, gloves, or orthotics as told by your health care provider. This information is not intended to replace advice given to you by your health care provider. Make sure you discuss any questions you have with your health care provider. Document Released: 06/09/2004 Document Revised: 12/24/2018 Document Reviewed: 07/17/2017 Elsevier Patient Education  2020 [employer].

## 2019-05-18 MED ORDER — NONFORMULARY OR COMPOUNDED ITEM: 3 refills | Status: DC

## 2019-05-25 ENCOUNTER — Encounter: Payer: Self-pay | Admitting: Podiatry

## 2019-05-25 NOTE — Progress Notes (Signed)
Subjective: Amy Andrade presents today referred by Gayland Curry, DO for evaluation of both feet, but, moreso, appearance of right 3rd digit. Denies any trauma to area. Patient's son is present during the visit. Son states toenails are thick, elongated, and difficult to trim. They have becom painful in shoe gear per patient.    Past Medical History:  Diagnosis Date  . Alzheimer disease ([place])   . Asthma   . Hyperlipidemia   . Hypertension   . Pulmonary embolism ([place]) 2004   was short of breath--was on coumadin for 6 mos to a year     Patient Active Problem List   Diagnosis Date Noted  . Hyponatremia 05/31/2016  . Incidental lung nodule 05/30/2016  . History of pulmonary embolism 05/30/2016  . Alzheimer disease ([place])   . Asthma   . Hyperlipidemia   . Hypertension   . Skin lesion of chest wall 06/22/2011     Past Surgical History:  Procedure Laterality Date  . ABDOMINAL HYSTERECTOMY    . BUNIONECTOMY    . REPLACEMENT TOTAL KNEE  1980s, 2003   x2    Medications: Angiotensin II Receptor Antagonists   irbesartan (AVAPRO) 300 MG tablet           Calcium Channel Blockers   amLODipine (NORVASC) 10 MG tablet           HMG CoA Reductase Inhibitors   pravastatin (PRAVACHOL) 40 MG tablet           Nitrates   nitroGLYCERIN (NITROSTAT) 0.4 MG SL tablet 0.4 mg, Every 5 min x3 PRN          Salicylates   aspirin 81 MG tablet 81 mg, Daily          Sympathomimetics   albuterol (PROVENTIL HFA;VENTOLIN HFA) 108 (90 Base) MCG/ACT inhaler 2 puff, Every 6 hours PRN           budesonide-formoterol (SYMBICORT) 160-4.5 MCG/ACT inhaler 2 puff, 2 times daily      No Known Allergies   Social History   Occupational History  . Not on file  Tobacco Use  . Smoking status: Former Smoker    Packs/day: 0.25    Years: 4.00    Pack years: 1.00    Types: Cigarettes    Quit date: 09/17/1957    Years since quitting: 61.7  . Smokeless tobacco: Never Used  Substance and Sexual  Activity  . Alcohol use: Yes    Alcohol/week: 42.0 standard drinks    Types: 42 Glasses of wine per week  . Drug use: No  . Sexual activity: Not on file     Family History  Problem Relation Age of Onset  . Alzheimer's disease Mother   . Chronic bronchitis Mother   . Heart disease Father   . High blood pressure Father   . High blood pressure Brother   . Asthma Daughter   . High blood pressure Son   . High Cholesterol Son   . Post-traumatic stress disorder Son      Immunization History  Administered Date(s) Administered  . Influenza, High Dose Seasonal PF 10/27/2018  . Influenza,inj,Quad PF,6+ Mos 05/27/2015, 06/18/2016  . Influenza-Unspecified 05/18/2017  . Pneumococcal Conjugate-13 10/11/2014  . Pneumococcal Polysaccharide-23 01/20/2016  . Tdap 09/17/2010  . Zoster 07/22/2014     Review of systems: Positive Findings in bold print.  Constitutional:  chills, fatigue, fever, sweats, weight change Communication: Optometrist, sign Ecologist, hand writing, iPad/Android device Head: headaches, head injury Eyes:  changes in vision, eye pain, glaucoma, cataracts, macular degeneration, diplopia, glare,  light sensitivity, eyeglasses or contacts, blindness Ears nose mouth throat: hearing impaired, hearing aids,  ringing in ears, deaf, sign language,  vertigo,   nosebleeds,  rhinitis,  cold sores, snoring, swollen glands Cardiovascular: HTN, edema, arrhythmia, pacemaker in place, defibrillator in place, chest pain/tightness, chronic anticoagulation, blood clot, heart failure, MI Peripheral Vascular: leg cramps, varicose veins, blood clots, lymphedema, varicosities Respiratory:  difficulty breathing, denies congestion, SOB, wheezing, cough, emphysema, asthma Gastrointestinal: change in appetite or weight, abdominal pain, constipation, diarrhea, nausea, vomiting, vomiting blood, change in bowel habits, abdominal pain, jaundice, rectal bleeding, hemorrhoids, GERD Genitourinary:   nocturia,  pain on urination, polyuria,  blood in urine, Foley catheter, urinary urgency, ESRD on hemodialysis Musculoskeletal: amputation, cramping, stiff joints, painful joints, decreased joint motion, fractures, OA, gout, hemiplegia, paraplegia, uses cane, wheelchair bound, uses walker, uses rollator Skin: +changes in toenails, color change, dryness, itching, mole changes,  rash, wound(s) Neurological: headaches, numbness in feet, paresthesias in feet, burning in feet, fainting,  seizures, change in speech. denies headaches, memory problems/poor historian, cerebral palsy, weakness, paralysis, CVA, TIA Endocrine: diabetes, hypothyroidism, hyperthyroidism,  goiter, dry mouth, flushing, heat intolerance,  cold intolerance,  excessive thirst, denies polyuria,  nocturia Hematological:  easy bleeding, excessive bleeding, easy bruising, enlarged lymph nodes, on long term blood thinner, history of past transusions Allergy/immunological:  hives, eczema, frequent infections, multiple drug allergies, seasonal allergies, transplant recipient, multiple food allergies Psychiatric:  anxiety, depression, mood disorder, suicidal ideations, hallucinations, insomnia  Objective: Vitals:   05/15/19 1628  BP: (!) 149/75    Vascular Examination: Capillary refill time immediate x 10 digits.  Dorsalis pedis pulses palpable b/l.   Posterior tibial pulses palpable b/l.   No digital hair x 10 digits.  Skin temperature gradient WNL b/l.  Edema noted b/l ankles/feet, nonpitting in nature.  Dermatological Examination: Skin with normal turgor, texture and tone b/l.  Toenails 1-5 b/l discolored, thick, dystrophic with subungual debris and pain with palpation to nailbeds due to thickness of nails.   Right 3rd digit nailplate dystrophic and growing in retrograde direction, irritating proximal nailfold. There is nail border hypertrophy. No purulence, no edema, no drainage, no flocculence or deep space  abscess.  Hyperkeratotic lesion distal tip left 3rd digit. No erythema, no edema, no drainage, no flocculence noted.  Musculoskeletal: Muscle strength 5/5 to all LE muscle groups.  Hammertoes 2-5 b/l.  Neurological: Sensation intact with 10 gram monofilament.  Vibratory sensation intact.  Assessment: 1. Painful onychomycosis toenails 1-5 b/l   2.  Corn distal tip left 3rd digit 3.  Hammertoes 4.     Plan: 1. Discussed onychomycosis and treatment options.  Literature dispensed on today. Rx written for nonformulary compounding topical antifungal: [facility]: Antifungal cream - Terbinafine 3%, Fluconazole 2%, Tea Tree Oil 5%, Urea 10%, Ibuprofen 2% in DMSO Suspension #230ml. Apply to the affected nail(s) at bedtime. 2. Toenails 1-5 b/l were debrided in length and girth without iatrogenic bleeding. Offending nail borders debrided and curretaged right 3rd digit. Borders cleansed with alcohol and triple antibiotic applied.  3. For edema, advised to wear her compression hose.  4. Patient to continue soft, supportive shoe gear daily. 5. Patient to report any pedal injuries to medical professional immediately. 6. Follow up 3 months.  7. Patient/POA to call should there be a concern in the interim.

## 2019-06-09 ENCOUNTER — Other Ambulatory Visit: Payer: Self-pay | Admitting: *Deleted

## 2019-06-09 DIAGNOSIS — I10 Essential (primary) hypertension: Secondary | ICD-10-CM

## 2019-06-09 MED ORDER — AMLODIPINE BESYLATE 10 MG PO TABS: 10.0000 mg | ORAL_TABLET | Freq: Every day | ORAL | 1 refills | Status: DC

## 2019-06-09 NOTE — Telephone Encounter (Signed)
[facility] 

## 2019-08-07 ENCOUNTER — Ambulatory Visit: Payer: Medicare Other | Admitting: Podiatry

## 2019-10-29 ENCOUNTER — Ambulatory Visit: Payer: Medicare Other | Admitting: Family

## 2019-11-09 ENCOUNTER — Ambulatory Visit: Payer: Medicare Other | Admitting: Podiatry

## 2019-11-20 ENCOUNTER — Encounter: Payer: Medicare Other | Admitting: Family

## 2019-11-20 ENCOUNTER — Other Ambulatory Visit: Payer: Self-pay

## 2019-11-23 ENCOUNTER — Ambulatory Visit (INDEPENDENT_AMBULATORY_CARE_PROVIDER_SITE_OTHER): Payer: Medicare Other | Admitting: Family

## 2019-11-23 ENCOUNTER — Encounter: Payer: Self-pay | Admitting: Family

## 2019-11-23 ENCOUNTER — Other Ambulatory Visit: Payer: Self-pay

## 2019-11-23 DIAGNOSIS — Z0000 Encounter for general adult medical examination without abnormal findings: Secondary | ICD-10-CM

## 2019-11-23 DIAGNOSIS — Z23 Encounter for immunization: Secondary | ICD-10-CM

## 2019-11-23 NOTE — Patient Instructions (Signed)
Amy Andrade , Thank you for taking time to come for your Medicare Wellness Visit. I appreciate your ongoing commitment to your health goals. Please review the following plan we discussed and let me know if I can assist you in the future.   Screening recommendations/referrals: Colonoscopy: Age out  Mammogram: Up to date  Bone Density : Up to date  Recommended yearly ophthalmology/optometry visit for glaucoma screening and checkup Recommended yearly dental visit for hygiene and checkup  Vaccinations: Influenza vaccine : Up to date  Pneumococcal vaccine : Up to date  Tdap vaccine : Up to date due 09/17/2020  Shingles vaccine: Please get Shingrix vaccine at your Pharmacy   Advanced directives:Yes   Conditions/risks identified:Advance age female >76 yrs,Hypertension,dyslipidemia,Hx of smoking    Next appointment: 1 year    Preventive Care 32 Years and Older, Female Preventive care refers to lifestyle choices and visits with your health care provider that can promote health and wellness. What does preventive care include?  A yearly physical exam. This is also called an annual well check.  Dental exams once or twice a year.  Routine eye exams. Ask your health care provider how often you should have your eyes checked.  Personal lifestyle choices, including:  Daily care of your teeth and gums.  Regular physical activity.  Eating a healthy diet.  Avoiding tobacco and drug use.  Limiting alcohol use.  Practicing safe sex.  Taking low-dose aspirin every day.  Taking vitamin and mineral supplements as recommended by your health care provider. What happens during an annual well check? The services and screenings done by your health care provider during your annual well check will depend on your age, overall health, lifestyle risk factors, and family history of disease. Counseling  Your health care provider may ask you questions about your:  Alcohol use.  Tobacco use.  Drug  use.  Emotional well-being.  Home and relationship well-being.  Sexual activity.  Eating habits.  History of falls.  Memory and ability to understand (cognition).  Work and work Astronomer.  Reproductive health. Screening  You may have the following tests or measurements:  Height, weight, and BMI.  Blood pressure.  Lipid and cholesterol levels. These may be checked every 5 years, or more frequently if you are over 75 years old.  Skin check.  Lung cancer screening. You may have this screening every year starting at age 24 if you have a 30-pack-year history of smoking and currently smoke or have quit within the past 15 years.  Fecal occult blood test (FOBT) of the stool. You may have this test every year starting at age 64.  Flexible sigmoidoscopy or colonoscopy. You may have a sigmoidoscopy every 5 years or a colonoscopy every 10 years starting at age 42.  Hepatitis C blood test.  Hepatitis B blood test.  Sexually transmitted disease (STD) testing.  Diabetes screening. This is done by checking your blood sugar (glucose) after you have not eaten for a while (fasting). You may have this done every 1-3 years.  Bone density scan. This is done to screen for osteoporosis. You may have this done starting at age 92.  Mammogram. This may be done every 1-2 years. Talk to your health care provider about how often you should have regular mammograms. Talk with your health care provider about your test results, treatment options, and if necessary, the need for more tests. Vaccines  Your health care provider may recommend certain vaccines, such as:  Influenza vaccine. This is recommended  every year.  Tetanus, diphtheria, and acellular pertussis (Tdap, Td) vaccine. You may need a Td booster every 10 years.  Zoster vaccine. You may need this after age 38.  Pneumococcal 13-valent conjugate (PCV13) vaccine. One dose is recommended after age 50.  Pneumococcal polysaccharide  (PPSV23) vaccine. One dose is recommended after age 36. Talk to your health care provider about which screenings and vaccines you need and how often you need them. This information is not intended to replace advice given to you by your health care provider. Make sure you discuss any questions you have with your health care provider. Document Released: 09/30/2015 Document Revised: 05/23/2016 Document Reviewed: 07/05/2015 Elsevier Interactive Patient Education  2017 [place] Prevention in the Home Falls can cause injuries. They can happen to people of all ages. There are many things you can do to make your home safe and to help prevent falls. What can I do on the outside of my home?  Regularly fix the edges of walkways and driveways and fix any cracks.  Remove anything that might make you trip as you walk through a door, such as a raised step or threshold.  Trim any bushes or trees on the path to your home.  Use bright outdoor lighting.  Clear any walking paths of anything that might make someone trip, such as rocks or tools.  Regularly check to see if handrails are loose or broken. Make sure that both sides of any steps have handrails.  Any raised decks and porches should have guardrails on the edges.  Have any leaves, snow, or ice cleared regularly.  Use sand or salt on walking paths during winter.  Clean up any spills in your garage right away. This includes oil or grease spills. What can I do in the bathroom?  Use night lights.  Install grab bars by the toilet and in the tub and shower. Do not use towel bars as grab bars.  Use non-skid mats or decals in the tub or shower.  If you need to sit down in the shower, use a plastic, non-slip stool.  Keep the floor dry. Clean up any water that spills on the floor as soon as it happens.  Remove soap buildup in the tub or shower regularly.  Attach bath mats securely with double-sided non-slip rug tape.  Do not have  throw rugs and other things on the floor that can make you trip. What can I do in the bedroom?  Use night lights.  Make sure that you have a light by your bed that is easy to reach.  Do not use any sheets or blankets that are too big for your bed. They should not hang down onto the floor.  Have a firm chair that has side arms. You can use this for support while you get dressed.  Do not have throw rugs and other things on the floor that can make you trip. What can I do in the kitchen?  Clean up any spills right away.  Avoid walking on wet floors.  Keep items that you use a lot in easy-to-reach places.  If you need to reach something above you, use a strong step stool that has a grab bar.  Keep electrical cords out of the way.  Do not use floor polish or wax that makes floors slippery. If you must use wax, use non-skid floor wax.  Do not have throw rugs and other things on the floor that can make you trip. What  can I do with my stairs?  Do not leave any items on the stairs.  Make sure that there are handrails on both sides of the stairs and use them. Fix handrails that are broken or loose. Make sure that handrails are as long as the stairways.  Check any carpeting to make sure that it is firmly attached to the stairs. Fix any carpet that is loose or worn.  Avoid having throw rugs at the top or bottom of the stairs. If you do have throw rugs, attach them to the floor with carpet tape.  Make sure that you have a light switch at the top of the stairs and the bottom of the stairs. If you do not have them, ask someone to add them for you. What else can I do to help prevent falls?  Wear shoes that:  Do not have high heels.  Have rubber bottoms.  Are comfortable and fit you well.  Are closed at the toe. Do not wear sandals.  If you use a stepladder:  Make sure that it is fully opened. Do not climb a closed stepladder.  Make sure that both sides of the stepladder are  locked into place.  Ask someone to hold it for you, if possible.  Clearly mark and make sure that you can see:  Any grab bars or handrails.  First and last steps.  Where the edge of each step is.  Use tools that help you move around (mobility aids) if they are needed. These include:  Canes.  Walkers.  Scooters.  Crutches.  Turn on the lights when you go into a dark area. Replace any light bulbs as soon as they burn out.  Set up your furniture so you have a clear path. Avoid moving your furniture around.  If any of your floors are uneven, fix them.  If there are any pets around you, be aware of where they are.  Review your medicines with your doctor. Some medicines can make you feel dizzy. This can increase your chance of falling. Ask your doctor what other things that you can do to help prevent falls. This information is not intended to replace advice given to you by your health care provider. Make sure you discuss any questions you have with your health care provider. Document Released: 06/30/2009 Document Revised: 02/09/2016 Document Reviewed: 10/08/2014 Elsevier Interactive Patient Education  2017 [employer].

## 2019-11-23 NOTE — Progress Notes (Signed)
Patient ID: SYMPHONI HELBLING, female   DOB: [birth year], 84 y.o.   MRN: [id number] This service is provided via telemedicine  No vital signs collected/recorded due to the encounter was a telemedicine visit.   Location of patient (ex: home, work):  home  Patient consents to a telephone visit:  yes  Location of the provider (ex: office, home):  office   Name of any referring provider:  Bufford Spikes, DO  Names of all persons participating in the telemedicine service and their role in the encounter:  Son, patient, Mervin Hack, CMA, Richarda Blade, NP  Time spent on call:  5:36   Subjective:   AKILI CUDA is a 84 y.o. female who presents for Medicare Annual (Subsequent) preventive examination.  Review of Systems:  Cardiac Risk Factors include: advanced age (>67men, >13 women);hypertension;dyslipidemia;smoking/ tobacco exposure     Objective:     Vitals: There were no vitals taken for this visit.  There is no height or weight on file to calculate BMI.  Advanced Directives 11/23/2019 04/28/2019 10/27/2018 11/18/2017 06/18/2016 05/30/2016 01/20/2016  Does Patient Have a Medical Advance Directive? Yes Yes Yes Yes Yes Yes Yes  Type of Estate agent of [place];Living will Healthcare Power of [employer] Power of [employer] Power of [employer] Power of [employer] Power of [place];Living will Healthcare Power of Attorney  Does patient want to make changes to medical advance directive? No - Patient declined No - Patient declined No - Patient declined No - Patient declined - - -  Copy of Healthcare Power of Attorney in Chart? Yes - validated most recent copy scanned in chart (See row information) Yes - validated most recent copy scanned in chart (See row information) Yes - validated most recent copy scanned in chart (See row information) Yes Yes - Yes    Tobacco Social History   Tobacco Use  Smoking Status Former Smoker  . Packs/day: 0.25   . Years: 4.00  . Pack years: 1.00  . Types: Cigarettes  . Quit date: 09/17/1957  . Years since quitting: 62.2  Smokeless Tobacco Never Used     Counseling given: Not Answered   Clinical Intake:  Pre-visit preparation completed: No  Pain : No/denies pain     BMI - recorded: 27.43 Nutritional Status: BMI 25 -29 Overweight Nutritional Risks: None Diabetes: No  How often do you need to have someone help you when you read instructions, pamphlets, or other written materials from your doctor or pharmacy?: 1 - Never What is the last grade level you completed in school?: PHd  Interpreter Needed?: No  Information entered by :: Jamyson Jirak FNP-C  Past Medical History:  Diagnosis Date  . Alzheimer disease (HCC)   . Asthma   . Hyperlipidemia   . Hypertension   . Pulmonary embolism (HCC) 2004   was short of breath--was on coumadin for 6 mos to a year   Past Surgical History:  Procedure Laterality Date  . ABDOMINAL HYSTERECTOMY    . BUNIONECTOMY    . REPLACEMENT TOTAL KNEE  1980s, 2003   x2   Family History  Problem Relation Age of Onset  . Alzheimer's disease Mother   . Chronic bronchitis Mother   . Heart disease Father   . High blood pressure Father   . High blood pressure Brother   . Asthma Daughter   . High blood pressure Son   . High Cholesterol Son   . Post-traumatic stress disorder Son    Social  History   Socioeconomic History  . Marital status: Widowed    Spouse name: Not on file  . Number of children: Not on file  . Years of education: Not on file  . Highest education level: Not on file  Occupational History  . Not on file  Tobacco Use  . Smoking status: Former Smoker    Packs/day: 0.25    Years: 4.00    Pack years: 1.00    Types: Cigarettes    Quit date: 09/17/1957    Years since quitting: 62.2  . Smokeless tobacco: Never Used  Substance and Sexual Activity  . Alcohol use: Yes    Alcohol/week: 42.0 standard drinks    Types: 42 Glasses of wine  per week  . Drug use: No  . Sexual activity: Not on file  Other Topics Concern  . Not on file  Social History Narrative   Diet: Eats well      Do you drink/ eat things with caffeine?Yes, 2 cups of coffee a day      Marital status:    Married--widowed in 2017                           What year were you married ? 1966      Do you live in a house, apartment,assistred living, condo, trailer, etc.)? House      Is it one or more stories? 2      How many persons live in your home ?  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  Yes                            Type & how often: Stationary bike 2 x/week      Do you have a living will? Yes      Do you have a DNR form?  Yes                     If not, do you want to discuss one?       Do you have signed POA?HPOA forms?   Yes              If so, please bring to your        appointment      Social Determinants of Health   Financial Resource Strain:   . Difficulty of Paying Living Expenses: Not on file  Food Insecurity:   . Worried About Programme researcher, broadcasting/film/video in the Last Year: Not on file  . Ran Out of Food in the Last Year: Not on file  Transportation Needs:   . Lack of Transportation (Medical): Not on file  . Lack of Transportation (Non-Medical): Not on file  Physical Activity:   . Days of Exercise per Week: Not on file  . Minutes of Exercise per Session: Not on file  Stress:   . Feeling of Stress : Not on file  Social Connections:   . Frequency of Communication with Friends and Family: Not on file  . Frequency of Social Gatherings with Friends and Family: Not on file  . Attends Religious Services: Not on file  . Active Member of Clubs or Organizations: Not on file  . Attends Club  or Organization Meetings: Not on file  . Marital Status: Not on file    Outpatient Encounter Medications as of 11/23/2019  Medication Sig  . albuterol (PROVENTIL  HFA;VENTOLIN HFA) 108 (90 Base) MCG/ACT inhaler Inhale 2 puffs into the lungs every 6 (six) hours as needed for wheezing or shortness of breath.  Marland Kitchen amLODipine (NORVASC) 10 MG tablet Take 1 tablet (10 mg total) by mouth daily.  Marland Kitchen aspirin 81 MG tablet Take 81 mg by mouth daily.    . budesonide-formoterol (SYMBICORT) 160-4.5 MCG/ACT inhaler Inhale 2 puffs into the lungs 2 (two) times daily. Symbicort 120/4.5 1 puff 2 x a day (Patient taking differently: Inhale 2 puffs into the lungs 2 (two) times daily. )  . irbesartan (AVAPRO) 300 MG tablet TAKE 1 TABLET(300 MG) BY MOUTH DAILY  . nitroGLYCERIN (NITROSTAT) 0.4 MG SL tablet Place 1 tablet (0.4 mg total) under the tongue every 5 (five) minutes x 3 doses as needed for chest pain.  . pravastatin (PRAVACHOL) 40 MG tablet TAKE 1 TABLET(40 MG) BY MOUTH DAILY  . [DISCONTINUED] NONFORMULARY OR COMPOUNDED ITEM Antifungal solution: Terbinafine 3%, Fluconazole 2%, Tea Tree Oil 5%, Urea 10%, Ibuprofen 2% in DMSO suspension #52mL   No facility-administered encounter medications on file as of 11/23/2019.    Activities of Daily Living In your present state of health, do you have any difficulty performing the following activities: 11/23/2019  Hearing? N  Vision? N  Difficulty concentrating or making decisions? Y  Comment Remembering  Walking or climbing stairs? N  Dressing or bathing? N  Doing errands, shopping? Y  Comment son Ship broker and eating ? Y  Comment Family assist  Using the Toilet? N  In the past six months, have you accidently leaked urine? N  Do you have problems with loss of bowel control? N  Managing your Finances? Y  Comment son assist  Housekeeping or managing your Housekeeping? Y  Comment son assist  Some recent data might be hidden    Patient Care Team: Kermit Balo, DO as PCP - General (Geriatric Medicine) Chalmers Guest, MD as Consulting Physician (Ophthalmology)    Assessment:   This is a routine wellness  examination for Doralee.  Exercise Activities and Dietary recommendations Current Exercise Habits: Home exercise routine, Type of exercise: Other - see comments(stationary Bike), Time (Minutes): 30, Frequency (Times/Week): 2, Weekly Exercise (Minutes/Week): 60, Intensity: Moderate, Exercise limited by: None identified  Goals   None     Fall Risk Fall Risk  11/23/2019 04/28/2019 10/27/2018 11/18/2017 10/22/2016  Falls in the past year? 0 0 0 No No  Number falls in past yr: 0 0 0 - -  Injury with Fall? 0 0 0 - -   Is the patient's home free of loose throw rugs in walkways, pet beds, electrical cords, etc?   no      Grab bars in the bathroom? yes      Handrails on the stairs?   yes      Adequate lighting?   yes  Depression Screen PHQ 2/9 Scores 11/23/2019 11/18/2017 10/22/2016 06/18/2016  PHQ - 2 Score 0 1 0 0     Cognitive Function MMSE - Mini Mental State Exam 11/18/2017 06/18/2016 04/21/2015  Orientation to time 2 3 2   Orientation to Place 5 5 5   Registration 3 3 3   Attention/ Calculation 5 5 5   Recall 1 3 1   Language- name 2 objects 2 2 2   Language- repeat 1 1 1  Language- follow 3 step command 3 3 3   Language- read & follow direction 1 1 1   Write a sentence 1 1 1   Copy design 1 1 1   Total score 25 28 25      6CIT Screen 11/23/2019  What Year? 4 points  What month? 3 points  What time? 0 points  Count back from 20 0 points  Months in reverse 0 points  Repeat phrase 0 points  Total Score 7    Immunization History  Administered Date(s) Administered  . Influenza, High Dose Seasonal PF 10/27/2018  . Influenza,inj,Quad PF,6+ Mos 05/27/2015, 06/18/2016  . Influenza-Unspecified 05/18/2017  . Pneumococcal Conjugate-13 10/11/2014  . Pneumococcal Polysaccharide-23 01/20/2016  . Tdap 09/17/2010  . Zoster 07/22/2014    Qualifies for Shingles Vaccine? Will Get at her Pharmacy   Screening Tests Health Maintenance  Topic Date Due  . INFLUENZA VACCINE  04/18/2019  . TETANUS/TDAP   09/17/2020  . DEXA SCAN  Completed  . PNA vac Low Risk Adult  Completed    Cancer Screenings: Lung: Low Dose CT Chest recommended if Age 41-80 years, 30 pack-year currently smoking OR have quit w/in 15years. Patient does not qualify. Breast:  Up to date on Mammogram? Yes   Up to date of Bone Density/Dexa? Yes Colorectal: Age out   Additional Screenings: Hepatitis C Screening: Low Risk      Plan:   - Advised to get Shingrix vaccine at the pharmacy   I have personally reviewed and noted the following in the patient's chart:   . Medical and social history . Use of alcohol, tobacco or illicit drugs  . Current medications and supplements . Functional ability and status . Nutritional status . Physical activity . Advanced directives . List of other physicians . Hospitalizations, surgeries, and ER visits in previous 12 months . Vitals . Screenings to include cognitive, depression, and falls . Referrals and appointments  In addition, I have reviewed and discussed with patient certain preventive protocols, quality metrics, and best practice recommendations. A written personalized care plan for preventive services as well as general preventive health recommendations were provided to patient.     Sandrea Hughs, NP  11/23/2019

## 2019-12-09 ENCOUNTER — Telehealth (INDEPENDENT_AMBULATORY_CARE_PROVIDER_SITE_OTHER): Payer: Medicare Other | Admitting: Family

## 2019-12-09 ENCOUNTER — Other Ambulatory Visit: Payer: Self-pay

## 2019-12-09 ENCOUNTER — Encounter: Payer: Self-pay | Admitting: Family

## 2019-12-09 DIAGNOSIS — L602 Onychogryphosis: Secondary | ICD-10-CM

## 2019-12-09 DIAGNOSIS — F0280 Dementia in other diseases classified elsewhere without behavioral disturbance: Secondary | ICD-10-CM

## 2019-12-09 DIAGNOSIS — G301 Alzheimer's disease with late onset: Secondary | ICD-10-CM | POA: Diagnosis not present

## 2019-12-09 DIAGNOSIS — I10 Essential (primary) hypertension: Secondary | ICD-10-CM

## 2019-12-09 DIAGNOSIS — J4520 Mild intermittent asthma, uncomplicated: Secondary | ICD-10-CM | POA: Diagnosis not present

## 2019-12-09 DIAGNOSIS — E785 Hyperlipidemia, unspecified: Secondary | ICD-10-CM | POA: Diagnosis not present

## 2019-12-09 NOTE — Progress Notes (Signed)
Patient ID: Amy Andrade, female   DOB: 1936-03-16, 84 y.o.   MRN: [id number] This service is provided via telemedicine  No vital signs collected/recorded due to the encounter was a telemedicine visit.   Location of patient (ex: home, work):  HOME  Patient consents to a telephone visit:  YES  Location of the provider (ex: office, home):  OFFICE  Name of any referring provider:  TIFFANY REED, DO  Names of all persons participating in the telemedicine service and their role in the encounter:  PATIENT, Edwin Dada, [place], [facility], NP  Time spent on call:  3:13  Provider: Alis Sawchuk FNP-C   Gayland Curry, DO  Patient Care Team: Gayland Curry, DO as PCP - General (Geriatric Medicine) Marylynn Pearson, MD as Consulting Physician (Ophthalmology)  Extended Emergency Contact Information Primary Emergency Contact: Hilliker,[name] Address: [address] [place], [place] [address] [place] Phone: [phone] Relation: Other Secondary Emergency Contact: Delancey,[name] Address: [place], [place] [address] [place] Phone: [phone] Relation: Son  Code Status: Full Code  Goals of care: Advanced Directive information Advanced Directives 12/09/2019  Does Patient Have a Medical Advance Directive? Yes  Type of Advance Directive [place]  Does patient want to make changes to medical advance directive? No - Patient declined  Copy of [place] in Chart? Yes - validated most recent copy scanned in chart (See row information)     Chief Complaint  Patient presents with  . Medical Management of Chronic Issues    [facility] follow-up    HPI:  Pt is a 84y.o. female seen today for 6 months follow up for medical management of chronic diseases.Patient's son present during visit assisting with video call.she is seen sitting on her recliner.Son is concern about  patient's toenail states had an appointment with but she missed.On chart review,[facility]He will call and reschedule appointment.son thinks toenails are painful again patient does not like to stand too long when making her breakfast.He has noticed she does prepare her breakfast.she eats meals when son buys or if family prepares for her.she states has been doing well.No fall episode. Not checking Blood pressure on a daily basis at home but [facility] and blood pressure was within normal range.she is taking her medication daily.she denies any signs of hypotension.also denies any chest pain.Has not required her Nitro.currently on Irbesartan 300 mg tablet daily and amlodipine 10 mg tablet daily.On ASA and Pravastatin.   Asthma - states symptoms under control.she has not required her rescue inhaler.she denies any  Worsening cough,wheezing or shortness of breath.She has not had her COVID-19 vaccine.Son states still monitoring the side effects of COVID-19 vaccine then will make a decision.Family strict on hand hygiene and wearing mask.states patient stays home does not go out.   Dementia - able to bath,groom herself and prepares breakfast by herself.she lives alone but family visits daily.No behavioral issues reported.  Hyperlipidemia - her latest LDL 84 ,chol 182 and TRG 66 ( 04/28/2019).on pravastatin 40 mg tablet daily.       Past Medical History:  Diagnosis Date  . Alzheimer disease ([facility]   . Asthma   . Hyperlipidemia   . Hypertension   .  Pulmonary embolism ([place]) 2004   was short of breath--was on coumadin for 6 mos to a year   Past Surgical History:  Procedure Laterality Date  . ABDOMINAL HYSTERECTOMY    . BUNIONECTOMY    . REPLACEMENT TOTAL KNEE  1980s, 2003   x2    No Known Allergies  Allergies as of 12/09/2019   No Known Allergies     Medication List       Accurate as of December 09, 2019  1:24  PM. If you have any questions, ask your nurse or doctor.        albuterol 108 (90 Base) MCG/ACT inhaler Commonly known as: VENTOLIN HFA Inhale 2 puffs into the lungs every 6 (six) hours as needed for wheezing or shortness of breath.   amLODipine 10 MG tablet Commonly known as: NORVASC Take 1 tablet (10 mg total) by mouth daily.   aspirin 81 MG tablet Take 81 mg by mouth daily.   budesonide-formoterol 160-4.5 MCG/ACT inhaler Commonly known as: Symbicort Inhale 2 puffs into the lungs 2 (two) times daily. Symbicort 120/4.5 1 puff 2 x a day   irbesartan 300 MG tablet Commonly known as: AVAPRO TAKE 1 TABLET(300 MG) BY MOUTH DAILY   nitroGLYCERIN 0.4 MG SL tablet Commonly known as: NITROSTAT Place 1 tablet (0.4 mg total) under the tongue every 5 (five) minutes x 3 doses as needed for chest pain.   pravastatin 40 MG tablet Commonly known as: PRAVACHOL TAKE 1 TABLET(40 MG) BY MOUTH DAILY       Review of Systems  Constitutional: Negative for appetite change, chills, fatigue and fever.  HENT: Negative for congestion, rhinorrhea, sinus pressure, sinus pain, sneezing, sore throat and trouble swallowing.   Eyes: Negative for discharge, redness and itching.  Respiratory: Negative for cough, chest tightness, shortness of breath and wheezing.   Cardiovascular: Negative for chest pain, palpitations and leg swelling.  Gastrointestinal: Negative for abdominal distention, abdominal pain, constipation, diarrhea, nausea and vomiting.  Endocrine: Negative for cold intolerance, heat intolerance, polydipsia, polyphagia and polyuria.  Genitourinary: Negative for decreased urine volume, difficulty urinating, dysuria, flank pain, frequency and urgency.  Musculoskeletal: Positive for gait problem. Negative for joint swelling and myalgias.       Painful toenails  Skin: Negative for color change, pallor, rash and wound.  Neurological: Negative for dizziness, speech difficulty, weakness,  light-headedness, numbness and headaches.  Hematological: Does not bruise/bleed easily.  Psychiatric/Behavioral: Negative for agitation, behavioral problems and sleep disturbance. The patient is not nervous/anxious.     Immunization History  Administered Date(s) Administered  . Influenza, High Dose Seasonal PF 10/27/2018  . Influenza,inj,Quad PF,6+ Mos 05/27/2015, 06/18/2016  . Influenza-Unspecified 05/18/2017  . Pneumococcal Conjugate-13 10/11/2014  . Pneumococcal Polysaccharide-23 01/20/2016  . Tdap 09/17/2010  . Zoster 07/22/2014   Pertinent  Health Maintenance Due  Topic Date Due  . INFLUENZA VACCINE  04/18/2019  . DEXA SCAN  Completed  . PNA vac Low Risk Adult  Completed   Fall Risk  12/09/2019 11/23/2019 04/28/2019 10/27/2018 11/18/2017  Falls in the past year? 0 0 0 0 No  Number falls in past yr: 0 0 0 0 -  Injury with Fall? 0 0 0 0 -    There were no vitals filed for this visit. There is no height or weight on file to calculate BMI. Physical Exam Constitutional:      General: She is not in acute distress.    Appearance: She is not ill-appearing.  Eyes:  General: No scleral icterus.       Right eye: No discharge.        Left eye: No discharge.     Conjunctiva/sclera: Conjunctivae normal.  Pulmonary:     Effort: Pulmonary effort is normal. No respiratory distress.  Musculoskeletal:     Right lower leg: No edema.     Left lower leg: No edema.     Comments: Unsteady gait ambulates with a cane.   Skin:    Comments: Bilateral lower extremities skin dry.overgrown toenails.   Neurological:     Mental Status: She is alert. Mental status is at baseline.     Gait: Gait abnormal.  Psychiatric:        Mood and Affect: Mood normal.        Behavior: Behavior normal.        Thought Content: Thought content normal.    Labs reviewed: Recent Labs    04/28/19 1142  NA 133*  K 5.2  CL 98  CO2 22  GLUCOSE 95  BUN 15  CREATININE 1.01*  CALCIUM 9.8   Recent Labs     04/28/19 1142  AST 25  ALT 19  BILITOT 1.1  PROT 7.1   Recent Labs    04/28/19 1142  WBC 4.2  NEUTROABS 2,264  HGB 15.1  HCT 44.6  MCV 96.7  PLT 204   Lab Results  Component Value Date   TSH 1.55 04/28/2019   Lab Results  Component Value Date   HGBA1C 6.4 (H) 04/19/2015   Lab Results  Component Value Date   CHOL 182 04/28/2019   HDL 83 04/28/2019   LDLCALC 84 04/28/2019   TRIG 66 04/28/2019   CHOLHDL 2.2 04/28/2019    Significant Diagnostic Results in last 30 days:  No results found.  Assessment/Plan 1. Essential hypertension Continue on Irbesartan 300 mg tablet daily and amlodipine 10 mg tablet daily.On ASA and Pravastatin.  - Advised to notify provider if B/p > 140/90  - CBC with Differential/Platelet; Future - CMP with eGFR([facility]); Future - TSH; Future  2. Hyperlipidemia, unspecified hyperlipidemia type LDL at goal. - Continue on Pravastatin 40 mg tablet daily.   - Lipid panel; Future  3. Mild intermittent asthma without complication Symptoms under controlled. -continue on Symbicort 160-4.5 mcg/ACT inhaler 2 puffs twice daily. Continue on Albuterol every 6 hrs as needed.  4. Late onset Alzheimer's disease without behavioral disturbance ([place]) No new behavioral issues.continue with supportive care.   5. Overgrown toenails Overgrown painful toenails. Missed appointment with [place] son will call to reschedule appointment.   Family/ staff Communication: Reviewed plan of care with patient and son verbalized understanding.  Labs/tests ordered:  - CBC with Differential/Platelet; Future - CMP with eGFR([facility]); Future - TSH; Future - Lipid panel; Future  Next Appointment : 4 months for medical management of chronic issues. Please get labs done in 1-7 days.  I connected with  LUNA AUDIA and son on 12/09/19 by a video enabled telemedicine application and verified that I am speaking with the correct person using two identifiers.  I  discussed the limitations of evaluation and management by telemedicine. The patient expressed understanding and agreed to proceed.  Spent 23 minutes of non-face to face with patient    Sandrea Hughs, NP

## 2019-12-09 NOTE — Patient Instructions (Signed)
-   Continue current medication  - Reschedule your appointment with Dr.Galaway at [facility] for evaluation of painful toenails. Please get fasting Labs this week or next week.Will call you with results.

## 2019-12-14 ENCOUNTER — Other Ambulatory Visit: Payer: Medicare Other

## 2019-12-14 ENCOUNTER — Other Ambulatory Visit: Payer: Self-pay

## 2019-12-14 DIAGNOSIS — E785 Hyperlipidemia, unspecified: Secondary | ICD-10-CM

## 2019-12-14 DIAGNOSIS — I10 Essential (primary) hypertension: Secondary | ICD-10-CM

## 2019-12-15 LAB — CBC WITH DIFFERENTIAL/PLATELET
Absolute Monocytes: 500 cells/uL (ref 200–950)
Basophils Absolute: 21 cells/uL (ref 0–200)
Basophils Relative: 0.5 %
Eosinophils Absolute: 21 cells/uL (ref 15–500)
Eosinophils Relative: 0.5 %
HCT: 41.9 % (ref 35.0–45.0)
Hemoglobin: 14.2 g/dL (ref 11.7–15.5)
Lymphs Abs: 1399 cells/uL (ref 850–3900)
MCH: 33.1 pg — ABNORMAL HIGH (ref 27.0–33.0)
MCHC: 33.9 g/dL (ref 32.0–36.0)
MCV: 97.7 fL (ref 80.0–100.0)
MPV: 10.2 fL (ref 7.5–12.5)
Monocytes Relative: 11.9 %
Neutro Abs: 2260 cells/uL (ref 1500–7800)
Neutrophils Relative %: 53.8 %
Platelets: 233 10*3/uL (ref 140–400)
RBC: 4.29 10*6/uL (ref 3.80–5.10)
RDW: 12.1 % (ref 11.0–15.0)
Total Lymphocyte: 33.3 %
WBC: 4.2 10*3/uL (ref 3.8–10.8)

## 2019-12-15 LAB — LIPID PANEL
Cholesterol: 180 mg/dL (ref <200)
HDL: 96 mg/dL (ref >=50)
LDL Cholesterol (Calc): 70 mg/dL (calc)
Non-HDL Cholesterol (Calc): 84 mg/dL (calc) (ref <130)
Total CHOL/HDL Ratio: 1.9 (calc) (ref <5.0)
Triglycerides: 65 mg/dL (ref <150)

## 2019-12-15 LAB — COMPLETE METABOLIC PANEL WITH GFR
AG Ratio: 1.6 (calc) (ref 1.0–2.5)
ALT: 13 U/L (ref 6–29)
AST: 20 U/L (ref 10–35)
Albumin: 4.4 g/dL (ref 3.6–5.1)
Alkaline phosphatase (APISO): 55 U/L (ref 37–153)
BUN: 11 mg/dL (ref 7–25)
CO2: 24 mmol/L (ref 20–32)
Calcium: 10.1 mg/dL (ref 8.6–10.4)
Chloride: 98 mmol/L (ref 98–110)
Creat: 0.87 mg/dL (ref 0.60–0.88)
GFR, Est African American: 72 mL/min/{1.73_m2} (ref >=60)
GFR, Est Non African American: 62 mL/min/{1.73_m2} (ref >=60)
Globulin: 2.7 g/dL (calc) (ref 1.9–3.7)
Glucose, Bld: 103 mg/dL — ABNORMAL HIGH (ref 65–99)
Potassium: 4.6 mmol/L (ref 3.5–5.3)
Sodium: 133 mmol/L — ABNORMAL LOW (ref 135–146)
Total Bilirubin: 1.2 mg/dL (ref 0.2–1.2)
Total Protein: 7.1 g/dL (ref 6.1–8.1)

## 2019-12-15 LAB — TSH: TSH: 1.62 mIU/L (ref 0.40–4.50)

## 2020-01-23 ENCOUNTER — Other Ambulatory Visit: Payer: Self-pay | Admitting: Internal Medicine

## 2020-01-23 DIAGNOSIS — I10 Essential (primary) hypertension: Secondary | ICD-10-CM

## 2020-01-23 DIAGNOSIS — E7800 Pure hypercholesterolemia, unspecified: Secondary | ICD-10-CM

## 2020-01-25 NOTE — Telephone Encounter (Signed)
rx sent to pharmacy by e-script  

## 2020-04-11 ENCOUNTER — Ambulatory Visit: Payer: Self-pay | Admitting: Internal Medicine

## 2020-04-18 ENCOUNTER — Ambulatory Visit: Payer: Self-pay | Admitting: Internal Medicine

## 2020-04-25 ENCOUNTER — Other Ambulatory Visit: Payer: Self-pay

## 2020-04-25 ENCOUNTER — Encounter: Payer: Self-pay | Admitting: Internal Medicine

## 2020-04-25 ENCOUNTER — Ambulatory Visit (INDEPENDENT_AMBULATORY_CARE_PROVIDER_SITE_OTHER): Payer: Medicare Other | Admitting: Internal Medicine

## 2020-04-25 VITALS — BP 138/64 | HR 66 | Temp 97.3°F | Resp 16 | Ht 68.0 in | Wt 188.4 lb

## 2020-04-25 DIAGNOSIS — G301 Alzheimer's disease with late onset: Secondary | ICD-10-CM | POA: Diagnosis not present

## 2020-04-25 DIAGNOSIS — L309 Dermatitis, unspecified: Secondary | ICD-10-CM | POA: Diagnosis not present

## 2020-04-25 DIAGNOSIS — I10 Essential (primary) hypertension: Secondary | ICD-10-CM | POA: Diagnosis not present

## 2020-04-25 DIAGNOSIS — M545 Low back pain, unspecified: Secondary | ICD-10-CM | POA: Insufficient documentation

## 2020-04-25 DIAGNOSIS — E785 Hyperlipidemia, unspecified: Secondary | ICD-10-CM

## 2020-04-25 DIAGNOSIS — J4520 Mild intermittent asthma, uncomplicated: Secondary | ICD-10-CM | POA: Diagnosis not present

## 2020-04-25 DIAGNOSIS — L602 Onychogryphosis: Secondary | ICD-10-CM

## 2020-04-25 DIAGNOSIS — M25512 Pain in left shoulder: Secondary | ICD-10-CM

## 2020-04-25 DIAGNOSIS — G8929 Other chronic pain: Secondary | ICD-10-CM | POA: Insufficient documentation

## 2020-04-25 DIAGNOSIS — F1020 Alcohol dependence, uncomplicated: Secondary | ICD-10-CM

## 2020-04-25 DIAGNOSIS — F0280 Dementia in other diseases classified elsewhere without behavioral disturbance: Secondary | ICD-10-CM

## 2020-04-25 MED ORDER — METHOCARBAMOL 500 MG PO TABS: 500.0000 mg | ORAL_TABLET | Freq: Two times a day (BID) | ORAL | 0 refills | Status: DC | PRN

## 2020-04-25 MED ORDER — TRIAMCINOLONE ACETONIDE 0.1 % EX CREA: 1.0000 "application " | TOPICAL_CREAM | Freq: Two times a day (BID) | CUTANEOUS | 0 refills | Status: DC

## 2020-04-25 NOTE — Progress Notes (Signed)
Location:  [facility] clinic Provider:  Jaquelinne Glendening L. Renato Gails, D.O., C.M.D.  Goals of Care:  Advanced Directives 04/25/2020  Does Patient Have a Medical Advance Directive? Yes  Type of Advance Directive Healthcare Power of Attorney  Does patient want to make changes to medical advance directive? No - Guardian declined  Copy of Healthcare Power of Attorney in Chart? -     Chief Complaint  Patient presents with  . Medical Management of Chronic Issues    4 Month Follow Up  . Immunizations    Discuss the need for Covid Vaccine, and Influenza Vaccine.     HPI: Patient is a 84 y.o. female seen today for medical management of chronic diseases.    Areas on left upper arm and behind the ears that are scaly, like chicken skin.  Daughter-in-law was helping her bathe and found them.    She had her clotting issues in the past and her son has asked his cardiologist b/c he has similar problems and was told not to take it.  Her son who works at [employer] and is tested weekly and rest of family is trying not to get close.    Shoulder, neck and back on left side.  She cannot lift left arm well to "do a chicken wing".  They're wondering about some home health therapy.    Needs toenails trimmed.    No recent falls.  She is going to be moving downstairs and they are going to be upstairs.  She does not want to go to AL or have outside help routinely.    She's drinking less than last time, but she's sipping on wine throughout the day. Muscle relaxer is NOT going to mix with alcohol.    She is eating better now.  She eats a big breakfast and large dinner.  She had not been eating the breakfast b/c she is unable to cook for herself.  Family helping with breakfast and supper now.  She's getting better hydrated with water.    Past Medical History:  Diagnosis Date  . Alzheimer disease (HCC)   . Asthma   . Hyperlipidemia   . Hypertension   . Pulmonary embolism (HCC) 2004   was short of breath--was on coumadin for 6 mos  to a year    Past Surgical History:  Procedure Laterality Date  . ABDOMINAL HYSTERECTOMY    . BUNIONECTOMY    . REPLACEMENT TOTAL KNEE  1980s, 2003   x2    No Known Allergies  Outpatient Encounter Medications as of 04/25/2020  Medication Sig  . albuterol (PROVENTIL HFA;VENTOLIN HFA) 108 (90 Base) MCG/ACT inhaler Inhale 2 puffs into the lungs every 6 (six) hours as needed for wheezing or shortness of breath.  Marland Kitchen amLODipine (NORVASC) 10 MG tablet TAKE 1 TABLET(10 MG) BY MOUTH DAILY  . aspirin 81 MG tablet Take 81 mg by mouth daily.    . budesonide-formoterol (SYMBICORT) 160-4.5 MCG/ACT inhaler Inhale 2 puffs into the lungs 2 (two) times daily. Symbicort 120/4.5 1 puff 2 x a day  . irbesartan (AVAPRO) 300 MG tablet TAKE 1 TABLET(300 MG) BY MOUTH DAILY  . nitroGLYCERIN (NITROSTAT) 0.4 MG SL tablet Place 1 tablet (0.4 mg total) under the tongue every 5 (five) minutes x 3 doses as needed for chest pain.  . pravastatin (PRAVACHOL) 40 MG tablet TAKE 1 TABLET(40 MG) BY MOUTH DAILY   No facility-administered encounter medications on file as of 04/25/2020.    Review of Systems:  Review of Systems  Constitutional: Negative for chills and fever.  HENT: Negative for congestion and sore throat.   Eyes: Negative for blurred vision.       Glasses  Respiratory: Negative for cough and shortness of breath.   Cardiovascular: Positive for leg swelling. Negative for chest pain and palpitations.       Improved with different socks  Gastrointestinal: Negative for abdominal pain, constipation and diarrhea.  Genitourinary: Negative for dysuria.  Musculoskeletal: Positive for back pain and joint pain. Negative for falls.  Skin: Negative for itching and rash.  Neurological: Positive for weakness. Negative for dizziness and loss of consciousness.  Endo/Heme/Allergies: Bruises/bleeds easily.  Psychiatric/Behavioral: Positive for memory loss. Negative for depression. The patient is not nervous/anxious and does  not have insomnia.     Health Maintenance  Topic Date Due  . COVID-19 Vaccine (1) Never done  . INFLUENZA VACCINE  04/17/2020  . TETANUS/TDAP  09/17/2020  . DEXA SCAN  Completed  . PNA vac Low Risk Adult  Completed    Physical Exam: Vitals:   04/25/20 1110  BP: 138/64  Pulse: 66  Resp: 16  Temp: (!) 97.3 F (36.3 C)  SpO2: 98%  Weight: 188 lb 6.4 oz (85.5 kg)  Height: 5\' 8"  (1.727 m)   Body mass index is 28.65 kg/m. Physical Exam Vitals reviewed.  Constitutional:      General: She is not in acute distress.    Appearance: Normal appearance. She is not toxic-appearing.  HENT:     Head: Normocephalic and atraumatic.  Cardiovascular:     Rate and Rhythm: Normal rate and regular rhythm.     Pulses: Normal pulses.     Heart sounds: Normal heart sounds.  Pulmonary:     Effort: Pulmonary effort is normal.     Breath sounds: Normal breath sounds. No wheezing, rhonchi or rales.  Abdominal:     General: Bowel sounds are normal.  Musculoskeletal:     Right lower leg: No edema.     Left lower leg: No edema.     Comments: Pain in left shoulder with flexion, abduction, cannot internally rotate  Skin:    General: Skin is warm and dry.  Neurological:     Mental Status: She is alert.     Cranial Nerves: No cranial nerve deficit.     Sensory: No sensory deficit.     Motor: Weakness present.     Gait: Gait abnormal.     Comments: Very slow getting up, using cane only. Dragging left leg and ROM left shoulder minimal now, moving very slowly  Psychiatric:        Mood and Affect: Mood normal.     Labs reviewed: Basic Metabolic Panel: Recent Labs    04/28/19 1142 12/14/19 1025  NA 133* 133*  K 5.2 4.6  CL 98 98  CO2 22 24  GLUCOSE 95 103*  BUN 15 11  CREATININE 1.01* 0.87  CALCIUM 9.8 10.1  TSH 1.55 1.62   Liver Function Tests: Recent Labs    04/28/19 1142 12/14/19 1025  AST 25 20  ALT 19 13  BILITOT 1.1 1.2  PROT 7.1 7.1   No results for input(s): LIPASE,  AMYLASE in the last 8760 hours. No results for input(s): AMMONIA in the last 8760 hours. CBC: Recent Labs    04/28/19 1142 12/14/19 1025  WBC 4.2 4.2  NEUTROABS 2,264 2,260  HGB 15.1 14.2  HCT 44.6 41.9  MCV 96.7 97.7  PLT 204 233   Lipid Panel:  Recent Labs    04/28/19 1142 12/14/19 1025  CHOL 182 180  HDL 83 96  LDLCALC 84 70  TRIG 66 65  CHOLHDL 2.2 1.9   Lab Results  Component Value Date   HGBA1C 6.4 (H) 04/19/2015    Procedures since last visit: No results found.  Assessment/Plan 1. Dermatitis -suspect--behind both ears and on left upper arm near shoulder--raised papular rash, dark in color - triamcinolone cream (KENALOG) 0.1 %; Apply 1 application topically 2 (two) times daily.  Dispense: 30 g; Refill: 0  2. Late onset Alzheimer's disease without behavioral disturbance (HCC) -progressing and family moving in to help her now, they've been getting her meds, bringing her meals and helping her bathe and dress in the mornings  3. Mild intermittent asthma without complication -cont symbicort, prn albuterol, sounding good  4. Hyperlipidemia, unspecified hyperlipidemia type -needs f/u labs--none recently and she'd been isolating b/c they're not getting covid vaccines--see hpi  5. Essential hypertension -bp satisfactory with current meds, f/u bmp  6. Overgrown toenails -her son will arrange podiatry f/u when he feels it's safe for her to go with delta variant going around  7. Chronic left shoulder pain - home health therapy and robaxin when supervised - methocarbamol (ROBAXIN) 500 MG tablet; Take 1 tablet (500 mg total) by mouth 2 (two) times daily as needed for muscle spasms.  Dispense: 30 tablet; Refill: 0 - Ambulatory referral to Home Health  8. Chronic bilateral low back pain without sciatica - walking has declined a ton since I saw her in person last - will involve home health PT, OT for gait and shoulder, safety at home with new arrangements being done -  Ambulatory referral to Home Health  Labs/tests ordered:   Lab Orders     CBC with Differential/Platelet     COMPLETE METABOLIC PANEL WITH GFR     Lipid panel   Next appt:  6 mos med mgt  [place] Arch L. Jasir Rother, D.O. Geriatrics [facility] [address], [place] [address] Cell Phone (Mon-Fri 8am-5pm):  [phone] On Call:  [phone] & follow prompts after 5pm & weekends Office Phone:  [phone] Office Fax:  [phone]

## 2020-05-02 ENCOUNTER — Other Ambulatory Visit: Payer: Medicare Other

## 2020-05-03 ENCOUNTER — Other Ambulatory Visit: Payer: Self-pay

## 2020-05-03 ENCOUNTER — Other Ambulatory Visit: Payer: Medicare Other

## 2020-05-03 DIAGNOSIS — E785 Hyperlipidemia, unspecified: Secondary | ICD-10-CM | POA: Diagnosis not present

## 2020-05-03 DIAGNOSIS — I10 Essential (primary) hypertension: Secondary | ICD-10-CM | POA: Diagnosis not present

## 2020-05-03 LAB — COMPLETE METABOLIC PANEL WITH GFR
AG Ratio: 1.8 (calc) (ref 1.0–2.5)
ALT: 10 U/L (ref 6–29)
AST: 17 U/L (ref 10–35)
Albumin: 4.2 g/dL (ref 3.6–5.1)
Alkaline phosphatase (APISO): 55 U/L (ref 37–153)
BUN: 15 mg/dL (ref 7–25)
CO2: 24 mmol/L (ref 20–32)
Calcium: 9.6 mg/dL (ref 8.6–10.4)
Chloride: 96 mmol/L — ABNORMAL LOW (ref 98–110)
Creat: 0.86 mg/dL (ref 0.60–0.88)
GFR, Est African American: 73 mL/min/{1.73_m2} (ref >=60)
GFR, Est Non African American: 63 mL/min/{1.73_m2} (ref >=60)
Globulin: 2.3 g/dL (calc) (ref 1.9–3.7)
Glucose, Bld: 94 mg/dL (ref 65–99)
Potassium: 4.7 mmol/L (ref 3.5–5.3)
Sodium: 130 mmol/L — ABNORMAL LOW (ref 135–146)
Total Bilirubin: 1.1 mg/dL (ref 0.2–1.2)
Total Protein: 6.5 g/dL (ref 6.1–8.1)

## 2020-05-03 LAB — CBC WITH DIFFERENTIAL/PLATELET
Absolute Monocytes: 508 cells/uL (ref 200–950)
Basophils Absolute: 21 cells/uL (ref 0–200)
Basophils Relative: 0.5 %
Eosinophils Absolute: 29 cells/uL (ref 15–500)
Eosinophils Relative: 0.7 %
HCT: 40.8 % (ref 35.0–45.0)
Hemoglobin: 13.7 g/dL (ref 11.7–15.5)
Lymphs Abs: 1680 cells/uL (ref 850–3900)
MCH: 33.2 pg — ABNORMAL HIGH (ref 27.0–33.0)
MCHC: 33.6 g/dL (ref 32.0–36.0)
MCV: 98.8 fL (ref 80.0–100.0)
MPV: 10.3 fL (ref 7.5–12.5)
Monocytes Relative: 12.1 %
Neutro Abs: 1961 cells/uL (ref 1500–7800)
Neutrophils Relative %: 46.7 %
Platelets: 207 10*3/uL (ref 140–400)
RBC: 4.13 10*6/uL (ref 3.80–5.10)
RDW: 11.9 % (ref 11.0–15.0)
Total Lymphocyte: 40 %
WBC: 4.2 10*3/uL (ref 3.8–10.8)

## 2020-05-03 LAB — LIPID PANEL
Cholesterol: 192 mg/dL (ref <200)
HDL: 102 mg/dL (ref >=50)
LDL Cholesterol (Calc): 76 mg/dL (calc)
Non-HDL Cholesterol (Calc): 90 mg/dL (calc) (ref <130)
Total CHOL/HDL Ratio: 1.9 (calc) (ref <5.0)
Triglycerides: 48 mg/dL (ref <150)

## 2020-05-04 NOTE — Progress Notes (Signed)
Cholesterol is ok for her age and function. Blood counts are ok Sodium is lower than usual.  This may be due to her drinking habit.  I recommend adding gatorade 8oz twice a day to her regimen and recheck bmp in 2 weeks.

## 2020-05-05 ENCOUNTER — Other Ambulatory Visit: Payer: Self-pay

## 2020-05-05 DIAGNOSIS — I10 Essential (primary) hypertension: Secondary | ICD-10-CM

## 2020-05-05 NOTE — Progress Notes (Signed)
BMP

## 2020-05-19 ENCOUNTER — Ambulatory Visit (INDEPENDENT_AMBULATORY_CARE_PROVIDER_SITE_OTHER): Payer: Medicare Other | Admitting: Internal Medicine

## 2020-05-19 ENCOUNTER — Other Ambulatory Visit: Payer: Self-pay

## 2020-05-19 DIAGNOSIS — I10 Essential (primary) hypertension: Secondary | ICD-10-CM

## 2020-05-20 LAB — BASIC METABOLIC PANEL WITH GFR
BUN: 17 mg/dL (ref 7–25)
CO2: 23 mmol/L (ref 20–32)
Calcium: 9.2 mg/dL (ref 8.6–10.4)
Chloride: 96 mmol/L — ABNORMAL LOW (ref 98–110)
Creat: 0.79 mg/dL (ref 0.60–0.88)
GFR, Est African American: 81 mL/min/{1.73_m2} (ref >=60)
GFR, Est Non African American: 70 mL/min/{1.73_m2} (ref >=60)
Glucose, Bld: 89 mg/dL (ref 65–99)
Potassium: 4.6 mmol/L (ref 3.5–5.3)
Sodium: 131 mmol/L — ABNORMAL LOW (ref 135–146)

## 2020-05-20 NOTE — Progress Notes (Signed)
Sodium and kidney function have both improved slightly from last check.  Continue with the gatorade to help her hydration and sodium levels.  I recommend that Amy Andrade and Amy Andrade get mychart for her so they can see her results and send me messages if needed.

## 2020-06-02 ENCOUNTER — Other Ambulatory Visit: Payer: Self-pay | Admitting: Internal Medicine

## 2020-06-02 DIAGNOSIS — F0280 Dementia in other diseases classified elsewhere without behavioral disturbance: Secondary | ICD-10-CM

## 2020-06-02 DIAGNOSIS — R5381 Other malaise: Secondary | ICD-10-CM

## 2020-06-02 DIAGNOSIS — M545 Low back pain, unspecified: Secondary | ICD-10-CM

## 2020-06-02 DIAGNOSIS — G8929 Other chronic pain: Secondary | ICD-10-CM

## 2020-06-02 DIAGNOSIS — M25512 Pain in left shoulder: Secondary | ICD-10-CM

## 2020-06-06 DIAGNOSIS — M6281 Muscle weakness (generalized): Secondary | ICD-10-CM

## 2020-06-06 DIAGNOSIS — F0280 Dementia in other diseases classified elsewhere without behavioral disturbance: Secondary | ICD-10-CM | POA: Diagnosis not present

## 2020-06-06 DIAGNOSIS — J4520 Mild intermittent asthma, uncomplicated: Secondary | ICD-10-CM

## 2020-06-06 DIAGNOSIS — G8929 Other chronic pain: Secondary | ICD-10-CM | POA: Diagnosis not present

## 2020-06-06 DIAGNOSIS — R2681 Unsteadiness on feet: Secondary | ICD-10-CM

## 2020-06-06 DIAGNOSIS — M25512 Pain in left shoulder: Secondary | ICD-10-CM | POA: Diagnosis not present

## 2020-06-06 DIAGNOSIS — G301 Alzheimer's disease with late onset: Secondary | ICD-10-CM | POA: Diagnosis not present

## 2020-06-08 DIAGNOSIS — G8929 Other chronic pain: Secondary | ICD-10-CM | POA: Diagnosis not present

## 2020-06-08 DIAGNOSIS — J4520 Mild intermittent asthma, uncomplicated: Secondary | ICD-10-CM | POA: Diagnosis not present

## 2020-06-08 DIAGNOSIS — G301 Alzheimer's disease with late onset: Secondary | ICD-10-CM | POA: Diagnosis not present

## 2020-06-08 DIAGNOSIS — M6281 Muscle weakness (generalized): Secondary | ICD-10-CM | POA: Diagnosis not present

## 2020-06-08 DIAGNOSIS — M25512 Pain in left shoulder: Secondary | ICD-10-CM | POA: Diagnosis not present

## 2020-06-08 DIAGNOSIS — R2681 Unsteadiness on feet: Secondary | ICD-10-CM | POA: Diagnosis not present

## 2020-06-09 ENCOUNTER — Telehealth: Payer: Self-pay | Admitting: *Deleted

## 2020-06-09 DIAGNOSIS — M25512 Pain in left shoulder: Secondary | ICD-10-CM | POA: Diagnosis not present

## 2020-06-09 DIAGNOSIS — M6281 Muscle weakness (generalized): Secondary | ICD-10-CM | POA: Diagnosis not present

## 2020-06-09 DIAGNOSIS — J4520 Mild intermittent asthma, uncomplicated: Secondary | ICD-10-CM | POA: Diagnosis not present

## 2020-06-09 DIAGNOSIS — R2681 Unsteadiness on feet: Secondary | ICD-10-CM | POA: Diagnosis not present

## 2020-06-09 DIAGNOSIS — G8929 Other chronic pain: Secondary | ICD-10-CM | POA: Diagnosis not present

## 2020-06-09 DIAGNOSIS — G301 Alzheimer's disease with late onset: Secondary | ICD-10-CM | POA: Diagnosis not present

## 2020-06-09 NOTE — Telephone Encounter (Signed)
Will with [facility] called requesting verbal orders for PT 2x3wks.  Verbal orders given.

## 2020-06-13 ENCOUNTER — Other Ambulatory Visit: Payer: Self-pay | Admitting: Internal Medicine

## 2020-06-13 DIAGNOSIS — M25512 Pain in left shoulder: Secondary | ICD-10-CM

## 2020-06-13 NOTE — Telephone Encounter (Signed)
rx sent to pharmacy by e-script  

## 2020-06-14 DIAGNOSIS — M6281 Muscle weakness (generalized): Secondary | ICD-10-CM | POA: Diagnosis not present

## 2020-06-14 DIAGNOSIS — G8929 Other chronic pain: Secondary | ICD-10-CM | POA: Diagnosis not present

## 2020-06-14 DIAGNOSIS — G301 Alzheimer's disease with late onset: Secondary | ICD-10-CM | POA: Diagnosis not present

## 2020-06-14 DIAGNOSIS — J4520 Mild intermittent asthma, uncomplicated: Secondary | ICD-10-CM | POA: Diagnosis not present

## 2020-06-14 DIAGNOSIS — R2681 Unsteadiness on feet: Secondary | ICD-10-CM | POA: Diagnosis not present

## 2020-06-14 DIAGNOSIS — M25512 Pain in left shoulder: Secondary | ICD-10-CM | POA: Diagnosis not present

## 2020-06-15 DIAGNOSIS — G301 Alzheimer's disease with late onset: Secondary | ICD-10-CM | POA: Diagnosis not present

## 2020-06-15 DIAGNOSIS — M6281 Muscle weakness (generalized): Secondary | ICD-10-CM | POA: Diagnosis not present

## 2020-06-15 DIAGNOSIS — R2681 Unsteadiness on feet: Secondary | ICD-10-CM | POA: Diagnosis not present

## 2020-06-15 DIAGNOSIS — J4520 Mild intermittent asthma, uncomplicated: Secondary | ICD-10-CM | POA: Diagnosis not present

## 2020-06-15 DIAGNOSIS — G8929 Other chronic pain: Secondary | ICD-10-CM | POA: Diagnosis not present

## 2020-06-15 DIAGNOSIS — M25512 Pain in left shoulder: Secondary | ICD-10-CM | POA: Diagnosis not present

## 2020-06-16 DIAGNOSIS — J4520 Mild intermittent asthma, uncomplicated: Secondary | ICD-10-CM | POA: Diagnosis not present

## 2020-06-16 DIAGNOSIS — M6281 Muscle weakness (generalized): Secondary | ICD-10-CM | POA: Diagnosis not present

## 2020-06-16 DIAGNOSIS — G301 Alzheimer's disease with late onset: Secondary | ICD-10-CM | POA: Diagnosis not present

## 2020-06-16 DIAGNOSIS — M25512 Pain in left shoulder: Secondary | ICD-10-CM | POA: Diagnosis not present

## 2020-06-16 DIAGNOSIS — G8929 Other chronic pain: Secondary | ICD-10-CM | POA: Diagnosis not present

## 2020-06-16 DIAGNOSIS — R2681 Unsteadiness on feet: Secondary | ICD-10-CM | POA: Diagnosis not present

## 2020-06-17 DIAGNOSIS — G8929 Other chronic pain: Secondary | ICD-10-CM | POA: Diagnosis not present

## 2020-06-17 DIAGNOSIS — G301 Alzheimer's disease with late onset: Secondary | ICD-10-CM | POA: Diagnosis not present

## 2020-06-17 DIAGNOSIS — M6281 Muscle weakness (generalized): Secondary | ICD-10-CM | POA: Diagnosis not present

## 2020-06-17 DIAGNOSIS — R2681 Unsteadiness on feet: Secondary | ICD-10-CM | POA: Diagnosis not present

## 2020-06-17 DIAGNOSIS — J4520 Mild intermittent asthma, uncomplicated: Secondary | ICD-10-CM | POA: Diagnosis not present

## 2020-06-17 DIAGNOSIS — M25512 Pain in left shoulder: Secondary | ICD-10-CM | POA: Diagnosis not present

## 2020-06-20 DIAGNOSIS — M25512 Pain in left shoulder: Secondary | ICD-10-CM | POA: Diagnosis not present

## 2020-06-20 DIAGNOSIS — G8929 Other chronic pain: Secondary | ICD-10-CM | POA: Diagnosis not present

## 2020-06-20 DIAGNOSIS — R2681 Unsteadiness on feet: Secondary | ICD-10-CM | POA: Diagnosis not present

## 2020-06-20 DIAGNOSIS — M6281 Muscle weakness (generalized): Secondary | ICD-10-CM | POA: Diagnosis not present

## 2020-06-20 DIAGNOSIS — G301 Alzheimer's disease with late onset: Secondary | ICD-10-CM | POA: Diagnosis not present

## 2020-06-20 DIAGNOSIS — J4520 Mild intermittent asthma, uncomplicated: Secondary | ICD-10-CM | POA: Diagnosis not present

## 2020-06-21 DIAGNOSIS — M6281 Muscle weakness (generalized): Secondary | ICD-10-CM | POA: Diagnosis not present

## 2020-06-21 DIAGNOSIS — R2681 Unsteadiness on feet: Secondary | ICD-10-CM | POA: Diagnosis not present

## 2020-06-21 DIAGNOSIS — J4520 Mild intermittent asthma, uncomplicated: Secondary | ICD-10-CM | POA: Diagnosis not present

## 2020-06-21 DIAGNOSIS — G301 Alzheimer's disease with late onset: Secondary | ICD-10-CM | POA: Diagnosis not present

## 2020-06-21 DIAGNOSIS — M25512 Pain in left shoulder: Secondary | ICD-10-CM | POA: Diagnosis not present

## 2020-06-21 DIAGNOSIS — G8929 Other chronic pain: Secondary | ICD-10-CM | POA: Diagnosis not present

## 2020-06-23 DIAGNOSIS — M6281 Muscle weakness (generalized): Secondary | ICD-10-CM | POA: Diagnosis not present

## 2020-06-23 DIAGNOSIS — M25512 Pain in left shoulder: Secondary | ICD-10-CM | POA: Diagnosis not present

## 2020-06-23 DIAGNOSIS — J4520 Mild intermittent asthma, uncomplicated: Secondary | ICD-10-CM | POA: Diagnosis not present

## 2020-06-23 DIAGNOSIS — R2681 Unsteadiness on feet: Secondary | ICD-10-CM | POA: Diagnosis not present

## 2020-06-23 DIAGNOSIS — G8929 Other chronic pain: Secondary | ICD-10-CM | POA: Diagnosis not present

## 2020-06-23 DIAGNOSIS — G301 Alzheimer's disease with late onset: Secondary | ICD-10-CM | POA: Diagnosis not present

## 2020-06-24 DIAGNOSIS — M25512 Pain in left shoulder: Secondary | ICD-10-CM | POA: Diagnosis not present

## 2020-06-24 DIAGNOSIS — G301 Alzheimer's disease with late onset: Secondary | ICD-10-CM | POA: Diagnosis not present

## 2020-06-24 DIAGNOSIS — G8929 Other chronic pain: Secondary | ICD-10-CM | POA: Diagnosis not present

## 2020-06-24 DIAGNOSIS — R2681 Unsteadiness on feet: Secondary | ICD-10-CM | POA: Diagnosis not present

## 2020-06-24 DIAGNOSIS — J4520 Mild intermittent asthma, uncomplicated: Secondary | ICD-10-CM | POA: Diagnosis not present

## 2020-06-24 DIAGNOSIS — M6281 Muscle weakness (generalized): Secondary | ICD-10-CM | POA: Diagnosis not present

## 2020-06-27 DIAGNOSIS — G8929 Other chronic pain: Secondary | ICD-10-CM | POA: Diagnosis not present

## 2020-06-27 DIAGNOSIS — R2681 Unsteadiness on feet: Secondary | ICD-10-CM | POA: Diagnosis not present

## 2020-06-27 DIAGNOSIS — J4520 Mild intermittent asthma, uncomplicated: Secondary | ICD-10-CM | POA: Diagnosis not present

## 2020-06-27 DIAGNOSIS — M6281 Muscle weakness (generalized): Secondary | ICD-10-CM | POA: Diagnosis not present

## 2020-06-27 DIAGNOSIS — G301 Alzheimer's disease with late onset: Secondary | ICD-10-CM | POA: Diagnosis not present

## 2020-06-27 DIAGNOSIS — M25512 Pain in left shoulder: Secondary | ICD-10-CM | POA: Diagnosis not present

## 2020-06-28 DIAGNOSIS — M6281 Muscle weakness (generalized): Secondary | ICD-10-CM | POA: Diagnosis not present

## 2020-06-28 DIAGNOSIS — R2681 Unsteadiness on feet: Secondary | ICD-10-CM | POA: Diagnosis not present

## 2020-06-28 DIAGNOSIS — J4520 Mild intermittent asthma, uncomplicated: Secondary | ICD-10-CM | POA: Diagnosis not present

## 2020-06-28 DIAGNOSIS — G8929 Other chronic pain: Secondary | ICD-10-CM | POA: Diagnosis not present

## 2020-06-28 DIAGNOSIS — M25512 Pain in left shoulder: Secondary | ICD-10-CM | POA: Diagnosis not present

## 2020-06-28 DIAGNOSIS — G301 Alzheimer's disease with late onset: Secondary | ICD-10-CM | POA: Diagnosis not present

## 2020-06-30 DIAGNOSIS — G8929 Other chronic pain: Secondary | ICD-10-CM | POA: Diagnosis not present

## 2020-06-30 DIAGNOSIS — R2681 Unsteadiness on feet: Secondary | ICD-10-CM | POA: Diagnosis not present

## 2020-06-30 DIAGNOSIS — M6281 Muscle weakness (generalized): Secondary | ICD-10-CM | POA: Diagnosis not present

## 2020-06-30 DIAGNOSIS — M25512 Pain in left shoulder: Secondary | ICD-10-CM | POA: Diagnosis not present

## 2020-06-30 DIAGNOSIS — G301 Alzheimer's disease with late onset: Secondary | ICD-10-CM | POA: Diagnosis not present

## 2020-06-30 DIAGNOSIS — J4520 Mild intermittent asthma, uncomplicated: Secondary | ICD-10-CM | POA: Diagnosis not present

## 2020-07-04 DIAGNOSIS — R2681 Unsteadiness on feet: Secondary | ICD-10-CM | POA: Diagnosis not present

## 2020-07-04 DIAGNOSIS — G301 Alzheimer's disease with late onset: Secondary | ICD-10-CM | POA: Diagnosis not present

## 2020-07-04 DIAGNOSIS — M25512 Pain in left shoulder: Secondary | ICD-10-CM | POA: Diagnosis not present

## 2020-07-04 DIAGNOSIS — J4520 Mild intermittent asthma, uncomplicated: Secondary | ICD-10-CM | POA: Diagnosis not present

## 2020-07-04 DIAGNOSIS — G8929 Other chronic pain: Secondary | ICD-10-CM | POA: Diagnosis not present

## 2020-07-04 DIAGNOSIS — M6281 Muscle weakness (generalized): Secondary | ICD-10-CM | POA: Diagnosis not present

## 2020-07-16 ENCOUNTER — Other Ambulatory Visit: Payer: Self-pay | Admitting: Internal Medicine

## 2020-07-16 DIAGNOSIS — M25512 Pain in left shoulder: Secondary | ICD-10-CM

## 2020-08-14 ENCOUNTER — Other Ambulatory Visit: Payer: Self-pay | Admitting: Internal Medicine

## 2020-08-14 DIAGNOSIS — G8929 Other chronic pain: Secondary | ICD-10-CM

## 2020-08-31 ENCOUNTER — Inpatient Hospital Stay (HOSPITAL_COMMUNITY)
Admission: EM | Admit: 2020-08-31 | Discharge: 2020-09-05 | DRG: 177 | Disposition: A | Discharge status: Skilled Nursing Facility | Payer: Medicare Other | Attending: Internal Medicine | Admitting: Internal Medicine

## 2020-08-31 ENCOUNTER — Emergency Department (HOSPITAL_COMMUNITY): Payer: Medicare Other

## 2020-08-31 ENCOUNTER — Other Ambulatory Visit: Payer: Self-pay

## 2020-08-31 DIAGNOSIS — Z820 Family history of epilepsy and other diseases of the nervous system: Secondary | ICD-10-CM | POA: Diagnosis not present

## 2020-08-31 DIAGNOSIS — S72302A Unspecified fracture of shaft of left femur, initial encounter for closed fracture: Secondary | ICD-10-CM | POA: Diagnosis not present

## 2020-08-31 DIAGNOSIS — Z7951 Long term (current) use of inhaled steroids: Secondary | ICD-10-CM | POA: Diagnosis not present

## 2020-08-31 DIAGNOSIS — W19XXXA Unspecified fall, initial encounter: Secondary | ICD-10-CM

## 2020-08-31 DIAGNOSIS — F0280 Dementia in other diseases classified elsewhere without behavioral disturbance: Secondary | ICD-10-CM | POA: Diagnosis present

## 2020-08-31 DIAGNOSIS — J90 Pleural effusion, not elsewhere classified: Secondary | ICD-10-CM | POA: Diagnosis not present

## 2020-08-31 DIAGNOSIS — U071 COVID-19: Principal | ICD-10-CM | POA: Diagnosis present

## 2020-08-31 DIAGNOSIS — M25512 Pain in left shoulder: Secondary | ICD-10-CM | POA: Diagnosis not present

## 2020-08-31 DIAGNOSIS — E785 Hyperlipidemia, unspecified: Secondary | ICD-10-CM | POA: Diagnosis present

## 2020-08-31 DIAGNOSIS — G8929 Other chronic pain: Secondary | ICD-10-CM | POA: Diagnosis present

## 2020-08-31 DIAGNOSIS — Z23 Encounter for immunization: Secondary | ICD-10-CM | POA: Diagnosis present

## 2020-08-31 DIAGNOSIS — R279 Unspecified lack of coordination: Secondary | ICD-10-CM | POA: Diagnosis not present

## 2020-08-31 DIAGNOSIS — Z825 Family history of asthma and other chronic lower respiratory diseases: Secondary | ICD-10-CM

## 2020-08-31 DIAGNOSIS — I10 Essential (primary) hypertension: Secondary | ICD-10-CM | POA: Diagnosis present

## 2020-08-31 DIAGNOSIS — S43012A Anterior subluxation of left humerus, initial encounter: Secondary | ICD-10-CM | POA: Diagnosis not present

## 2020-08-31 DIAGNOSIS — S0990XA Unspecified injury of head, initial encounter: Secondary | ICD-10-CM | POA: Diagnosis not present

## 2020-08-31 DIAGNOSIS — S72402A Unspecified fracture of lower end of left femur, initial encounter for closed fracture: Secondary | ICD-10-CM | POA: Diagnosis not present

## 2020-08-31 DIAGNOSIS — Z7982 Long term (current) use of aspirin: Secondary | ICD-10-CM

## 2020-08-31 DIAGNOSIS — Z8249 Family history of ischemic heart disease and other diseases of the circulatory system: Secondary | ICD-10-CM | POA: Diagnosis not present

## 2020-08-31 DIAGNOSIS — M25562 Pain in left knee: Secondary | ICD-10-CM | POA: Diagnosis not present

## 2020-08-31 DIAGNOSIS — M545 Low back pain, unspecified: Secondary | ICD-10-CM | POA: Diagnosis present

## 2020-08-31 DIAGNOSIS — S7292XA Unspecified fracture of left femur, initial encounter for closed fracture: Secondary | ICD-10-CM | POA: Diagnosis present

## 2020-08-31 DIAGNOSIS — S72402D Unspecified fracture of lower end of left femur, subsequent encounter for closed fracture with routine healing: Secondary | ICD-10-CM | POA: Diagnosis not present

## 2020-08-31 DIAGNOSIS — R1311 Dysphagia, oral phase: Secondary | ICD-10-CM | POA: Diagnosis not present

## 2020-08-31 DIAGNOSIS — S79929A Unspecified injury of unspecified thigh, initial encounter: Secondary | ICD-10-CM | POA: Diagnosis not present

## 2020-08-31 DIAGNOSIS — Z90710 Acquired absence of both cervix and uterus: Secondary | ICD-10-CM

## 2020-08-31 DIAGNOSIS — J45909 Unspecified asthma, uncomplicated: Secondary | ICD-10-CM | POA: Diagnosis not present

## 2020-08-31 DIAGNOSIS — B342 Coronavirus infection, unspecified: Secondary | ICD-10-CM

## 2020-08-31 DIAGNOSIS — Y92013 Bedroom of single-family (private) house as the place of occurrence of the external cause: Secondary | ICD-10-CM | POA: Diagnosis not present

## 2020-08-31 DIAGNOSIS — Z86711 Personal history of pulmonary embolism: Secondary | ICD-10-CM

## 2020-08-31 DIAGNOSIS — M19032 Primary osteoarthritis, left wrist: Secondary | ICD-10-CM | POA: Diagnosis not present

## 2020-08-31 DIAGNOSIS — M25519 Pain in unspecified shoulder: Secondary | ICD-10-CM | POA: Diagnosis not present

## 2020-08-31 DIAGNOSIS — Z87891 Personal history of nicotine dependence: Secondary | ICD-10-CM

## 2020-08-31 DIAGNOSIS — G309 Alzheimer's disease, unspecified: Secondary | ICD-10-CM | POA: Diagnosis not present

## 2020-08-31 DIAGNOSIS — M2550 Pain in unspecified joint: Secondary | ICD-10-CM | POA: Diagnosis not present

## 2020-08-31 DIAGNOSIS — W010XXA Fall on same level from slipping, tripping and stumbling without subsequent striking against object, initial encounter: Secondary | ICD-10-CM | POA: Diagnosis not present

## 2020-08-31 DIAGNOSIS — Z79899 Other long term (current) drug therapy: Secondary | ICD-10-CM

## 2020-08-31 DIAGNOSIS — Z743 Need for continuous supervision: Secondary | ICD-10-CM | POA: Diagnosis not present

## 2020-08-31 DIAGNOSIS — M6281 Muscle weakness (generalized): Secondary | ICD-10-CM | POA: Diagnosis not present

## 2020-08-31 DIAGNOSIS — M9712XA Periprosthetic fracture around internal prosthetic left knee joint, initial encounter: Secondary | ICD-10-CM | POA: Diagnosis not present

## 2020-08-31 DIAGNOSIS — Z96652 Presence of left artificial knee joint: Secondary | ICD-10-CM | POA: Diagnosis not present

## 2020-08-31 DIAGNOSIS — S43015A Anterior dislocation of left humerus, initial encounter: Secondary | ICD-10-CM | POA: Diagnosis not present

## 2020-08-31 DIAGNOSIS — R2689 Other abnormalities of gait and mobility: Secondary | ICD-10-CM | POA: Diagnosis not present

## 2020-08-31 DIAGNOSIS — E871 Hypo-osmolality and hyponatremia: Secondary | ICD-10-CM | POA: Diagnosis not present

## 2020-08-31 DIAGNOSIS — G9341 Metabolic encephalopathy: Secondary | ICD-10-CM | POA: Diagnosis not present

## 2020-08-31 DIAGNOSIS — Z86718 Personal history of other venous thrombosis and embolism: Secondary | ICD-10-CM | POA: Diagnosis not present

## 2020-08-31 DIAGNOSIS — Z7401 Bed confinement status: Secondary | ICD-10-CM | POA: Diagnosis not present

## 2020-08-31 DIAGNOSIS — M1612 Unilateral primary osteoarthritis, left hip: Secondary | ICD-10-CM | POA: Diagnosis not present

## 2020-08-31 DIAGNOSIS — G9389 Other specified disorders of brain: Secondary | ICD-10-CM | POA: Diagnosis not present

## 2020-08-31 DIAGNOSIS — G319 Degenerative disease of nervous system, unspecified: Secondary | ICD-10-CM | POA: Diagnosis not present

## 2020-08-31 DIAGNOSIS — Z9181 History of falling: Secondary | ICD-10-CM | POA: Diagnosis not present

## 2020-08-31 DIAGNOSIS — M79605 Pain in left leg: Secondary | ICD-10-CM | POA: Diagnosis not present

## 2020-08-31 DIAGNOSIS — J4520 Mild intermittent asthma, uncomplicated: Secondary | ICD-10-CM | POA: Diagnosis not present

## 2020-08-31 DIAGNOSIS — R410 Disorientation, unspecified: Secondary | ICD-10-CM | POA: Diagnosis not present

## 2020-08-31 DIAGNOSIS — R296 Repeated falls: Secondary | ICD-10-CM | POA: Diagnosis not present

## 2020-08-31 DIAGNOSIS — R2681 Unsteadiness on feet: Secondary | ICD-10-CM | POA: Diagnosis not present

## 2020-08-31 LAB — COMPREHENSIVE METABOLIC PANEL
ALT: 23 U/L (ref 0–44)
AST: 44 U/L — ABNORMAL HIGH (ref 15–41)
Albumin: 3.8 g/dL (ref 3.5–5.0)
Alkaline Phosphatase: 64 U/L (ref 38–126)
Anion gap: 12 (ref 5–15)
BUN: 11 mg/dL (ref 8–23)
CO2: 23 mmol/L (ref 22–32)
Calcium: 9.2 mg/dL (ref 8.9–10.3)
Chloride: 94 mmol/L — ABNORMAL LOW (ref 98–111)
Creatinine, Ser: 0.82 mg/dL (ref 0.44–1.00)
GFR, Estimated: >60 mL/min (ref >60)
Glucose, Bld: 108 mg/dL — ABNORMAL HIGH (ref 70–99)
Potassium: 4.1 mmol/L (ref 3.5–5.1)
Sodium: 129 mmol/L — ABNORMAL LOW (ref 135–145)
Total Bilirubin: 1 mg/dL (ref 0.3–1.2)
Total Protein: 6.9 g/dL (ref 6.5–8.1)

## 2020-08-31 LAB — CBC WITH DIFFERENTIAL/PLATELET
Abs Immature Granulocytes: 0.01 10*3/uL (ref 0.00–0.07)
Basophils Absolute: 0 10*3/uL (ref 0.0–0.1)
Basophils Relative: 0 %
Eosinophils Absolute: 0 10*3/uL (ref 0.0–0.5)
Eosinophils Relative: 0 %
HCT: 39.6 % (ref 36.0–46.0)
Hemoglobin: 13.8 g/dL (ref 12.0–15.0)
Immature Granulocytes: 0 %
Lymphocytes Relative: 12 %
Lymphs Abs: 0.5 10*3/uL — ABNORMAL LOW (ref 0.7–4.0)
MCH: 33.2 pg (ref 26.0–34.0)
MCHC: 34.8 g/dL (ref 30.0–36.0)
MCV: 95.2 fL (ref 80.0–100.0)
Monocytes Absolute: 0.5 10*3/uL (ref 0.1–1.0)
Monocytes Relative: 12 %
Neutro Abs: 3.2 10*3/uL (ref 1.7–7.7)
Neutrophils Relative %: 76 %
Platelets: 209 10*3/uL (ref 150–400)
RBC: 4.16 MIL/uL (ref 3.87–5.11)
RDW: 12.4 % (ref 11.5–15.5)
WBC: 4.3 10*3/uL (ref 4.0–10.5)
nRBC: 0 % (ref 0.0–0.2)

## 2020-08-31 LAB — RESP PANEL BY RT-PCR (FLU A&B, COVID) ARPGX2
Influenza A by PCR: NEGATIVE
Influenza B by PCR: NEGATIVE
SARS Coronavirus 2 by RT PCR: POSITIVE — AB

## 2020-08-31 LAB — TYPE AND SCREEN
ABO/RH(D): AB POS
Antibody Screen: NEGATIVE

## 2020-08-31 LAB — PROTIME-INR
INR: 1 (ref 0.8–1.2)
Prothrombin Time: 12.6 seconds (ref 11.4–15.2)

## 2020-08-31 LAB — ABO/RH: ABO/RH(D): AB POS

## 2020-08-31 MED ORDER — IRBESARTAN 300 MG PO TABS
300.0000 mg | ORAL_TABLET | Freq: Every day | ORAL | Status: DC
  Administered 2020-09-01 – 2020-09-05 (×5): 300 mg via ORAL
  Filled 2020-08-31 (×5): qty 1

## 2020-08-31 MED ORDER — SODIUM CHLORIDE 0.9 % IV SOLN: INTRAVENOUS | Status: DC | PRN

## 2020-08-31 MED ORDER — FAMOTIDINE IN NACL 20-0.9 MG/50ML-% IV SOLN
20.0000 mg | Freq: Once | INTRAVENOUS | Status: DC | PRN
  Filled 2020-08-31: qty 50

## 2020-08-31 MED ORDER — EPINEPHRINE 0.3 MG/0.3ML IJ SOAJ
0.3000 mg | Freq: Once | INTRAMUSCULAR | Status: DC | PRN
  Filled 2020-08-31: qty 0.6

## 2020-08-31 MED ORDER — SODIUM CHLORIDE 0.9 % IV SOLN
Freq: Once | INTRAVENOUS | Status: AC
  Filled 2020-08-31 (×2): qty 20

## 2020-08-31 MED ORDER — AMLODIPINE BESYLATE 5 MG PO TABS
10.0000 mg | ORAL_TABLET | Freq: Every day | ORAL | Status: DC
  Administered 2020-09-01 – 2020-09-05 (×5): 10 mg via ORAL
  Filled 2020-08-31 (×5): qty 2

## 2020-08-31 MED ORDER — DIPHENHYDRAMINE HCL 50 MG/ML IJ SOLN: 50.0000 mg | Freq: Once | INTRAMUSCULAR | Status: DC | PRN

## 2020-08-31 MED ORDER — METHYLPREDNISOLONE SODIUM SUCC 125 MG IJ SOLR: 125.0000 mg | Freq: Once | INTRAMUSCULAR | Status: DC | PRN

## 2020-08-31 MED ORDER — MOMETASONE FURO-FORMOTEROL FUM 200-5 MCG/ACT IN AERO
2.0000 | INHALATION_SPRAY | Freq: Two times a day (BID) | RESPIRATORY_TRACT | Status: DC
  Administered 2020-09-01 – 2020-09-05 (×10): 2 via RESPIRATORY_TRACT
  Filled 2020-08-31 (×3): qty 8.8

## 2020-08-31 MED ORDER — ENOXAPARIN SODIUM 40 MG/0.4ML ~~LOC~~ SOLN: 40.0000 mg | SUBCUTANEOUS | Status: DC

## 2020-08-31 MED ORDER — ALBUTEROL SULFATE HFA 108 (90 BASE) MCG/ACT IN AERS
2.0000 | INHALATION_SPRAY | Freq: Four times a day (QID) | RESPIRATORY_TRACT | Status: DC | PRN
  Filled 2020-08-31: qty 6.7

## 2020-08-31 MED ORDER — POLYETHYLENE GLYCOL 3350 17 G PO PACK: 17.0000 g | PACK | Freq: Every day | ORAL | Status: DC | PRN

## 2020-08-31 MED ORDER — ASPIRIN EC 81 MG PO TBEC
81.0000 mg | DELAYED_RELEASE_TABLET | Freq: Every day | ORAL | Status: DC
  Administered 2020-09-01 – 2020-09-05 (×5): 81 mg via ORAL
  Filled 2020-08-31 (×5): qty 1

## 2020-08-31 MED ORDER — PRAVASTATIN SODIUM 40 MG PO TABS
40.0000 mg | ORAL_TABLET | Freq: Every day | ORAL | Status: DC
  Administered 2020-09-01 – 2020-09-05 (×5): 40 mg via ORAL
  Filled 2020-08-31 (×5): qty 1

## 2020-08-31 MED ORDER — HYDROCODONE-ACETAMINOPHEN 5-325 MG PO TABS
1.0000 | ORAL_TABLET | Freq: Four times a day (QID) | ORAL | Status: DC | PRN
  Administered 2020-08-31 – 2020-09-05 (×9): 1 via ORAL
  Filled 2020-08-31 (×9): qty 1

## 2020-08-31 MED ORDER — ALBUTEROL SULFATE HFA 108 (90 BASE) MCG/ACT IN AERS
2.0000 | INHALATION_SPRAY | Freq: Once | RESPIRATORY_TRACT | Status: DC | PRN
  Filled 2020-08-31 (×2): qty 6.7

## 2020-08-31 NOTE — H&P (Signed)
History and Physical   [name] Andrade:[id number] DOB: January 12, 1936 DOA: 08/31/2020  PCP: Kermit Balo, DO   Patient coming from: Home  Chief Complaint: Fall, left leg pain  HPI: Amy Andrade is a 84 y.o. female with medical history significant of Alzheimer dementia, asthma, chronic pain, hyperlipidemia, hypertension, chronic hyponatremia who presents with leg pain following a fall at home.  Due to patient's poor memory secondary to her Alzheimer's dementia history was obtained with the assistance of her family and chart reviewed. Patient had an unwitnessed fall at home 2 nights ago.  She was found down by her family who assisted her standing into her bed the following morning.  They noted that it appeared she had had a bowel movement and slipped on fecal matter based on what they saw when they arrived.  Patient has no memory of the events. Yesterday during the day she was able to get around fine including taking a shower and walking with her walker.  In the evening the pain began to bother her and she was unable to get up at all this morning, which led her family to have her transported to the ED. she reports only minimal pain at rest seems fairly comfortable. She has had some intermittent fevers for the past 7 days, she been denies cough, shortness of breath, chest pain, abdominal pain, constipation, diarrhea.  ED Course: Vital signs in ED significant for fever of 100.6 and hypertension in the 140s to 150s systolic.  Lab work showed sodium of 129 which is chronic for her, AST 44, CBC within normal limits, PT INR pending, type and screen pending, respiratory panel positive for coronavirus.  Imaging showed chest x-ray without acute abnormality, left knee x-ray with distal left femur fracture, left hip x-ray without acute abnormality, left shoulder x-ray without acute abnormality, left wrist x-ray without acute abnormality but arthritis noted.  CT head negative for acute abnormality.   Orthopedics was consulted in ED and patient is being admitted for further evaluation.  Review of Systems: As per HPI otherwise all other systems reviewed and are negative.  Past Medical History:  Diagnosis Date  . Alzheimer disease (HCC)   . Asthma   . Hyperlipidemia   . Hypertension   . Pulmonary embolism (HCC) 2004   was short of breath--was on coumadin for 6 mos to a year    Past Surgical History:  Procedure Laterality Date  . ABDOMINAL HYSTERECTOMY    . BUNIONECTOMY    . REPLACEMENT TOTAL KNEE  1980s, 2003   x2    Social History  reports that she quit smoking about 62 years ago. Her smoking use included cigarettes. She has a 1.00 pack-year smoking history. She has never used smokeless tobacco. She reports current alcohol use of about 42.0 standard drinks of alcohol per week. She reports that she does not use drugs.  No Known Allergies  Family History  Problem Relation Age of Onset  . Alzheimer's disease Mother   . Chronic bronchitis Mother   . Heart disease Father   . High blood pressure Father   . High blood pressure Brother   . Asthma Daughter   . High blood pressure Son   . High Cholesterol Son   . Post-traumatic stress disorder Son   Reviewed on admission  Prior to Admission medications   Medication Sig Start Date End Date Taking? Authorizing Provider  albuterol (PROVENTIL HFA;VENTOLIN HFA) 108 (90 Base) MCG/ACT inhaler Inhale 2 puffs into the lungs every 6 (  six) hours as needed for wheezing or shortness of breath. 10/22/16   Reed, Tiffany L, DO  amLODipine (NORVASC) 10 MG tablet TAKE 1 TABLET(10 MG) BY MOUTH DAILY 01/25/20   Reed, Tiffany L, DO  aspirin 81 MG tablet Take 81 mg by mouth daily.      [provider]  budesonide-formoterol (SYMBICORT) 160-4.5 MCG/ACT inhaler Inhale 2 puffs into the lungs 2 (two) times daily. Symbicort 120/4.5 1 puff 2 x a day 11/18/17   Reed, Tiffany L, DO  irbesartan (AVAPRO) 300 MG tablet TAKE 1 TABLET(300 MG) BY MOUTH DAILY  01/25/20   Reed, Tiffany L, DO  methocarbamol (ROBAXIN) 500 MG tablet TAKE 1 TABLET(500 MG) BY MOUTH TWICE DAILY AS NEEDED FOR MUSCLE SPASMS 08/15/20   Reed, Tiffany L, DO  nitroGLYCERIN (NITROSTAT) 0.4 MG SL tablet Place 1 tablet (0.4 mg total) under the tongue every 5 (five) minutes x 3 doses as needed for chest pain. 05/31/16   Ghimire, Werner Lean, MD  pravastatin (PRAVACHOL) 40 MG tablet TAKE 1 TABLET(40 MG) BY MOUTH DAILY 01/25/20   Reed, Tiffany L, DO  triamcinolone cream (KENALOG) 0.1 % Apply 1 application topically 2 (two) times daily. 04/25/20   Kermit Balo, DO    Physical Exam: Vitals:   08/31/20 1250 08/31/20 1538 08/31/20 1800  BP: (!) 146/65 (!) 153/76 (!) 153/74  Pulse: 85 83 82  Resp: Temp: (!) 100.6 F (38.1 C)    TempSrc: Oral    SpO2: 99% 96% 96%  Weight: 86.2 kg    Height:  (1.727 m)     Physical Exam Constitutional:      General: She is not in acute distress.    Appearance: Normal appearance.     Comments: Pleasant elderly female  HENT:     Head: Normocephalic and atraumatic.     Mouth/Throat:     Mouth: Mucous membranes are moist.     Pharynx: Oropharynx is clear.  Eyes:     Extraocular Movements: Extraocular movements intact.     Pupils: Pupils are equal, round, and reactive to light.  Cardiovascular:     Rate and Rhythm: Normal rate and regular rhythm.     Pulses: Normal pulses.     Heart sounds: Normal heart sounds.  Pulmonary:     Effort: Pulmonary effort is normal. No respiratory distress.     Breath sounds: Normal breath sounds.  Abdominal:     General: Bowel sounds are normal. There is no distension.     Palpations: Abdomen is soft.     Tenderness: There is no abdominal tenderness.  Musculoskeletal:        General: No swelling or deformity.     Comments: Left lower extremity neurovascularly intact  Skin:    General: Skin is warm and dry.  Neurological:     General: No focal deficit present.     Mental Status: Mental status is  at baseline.    Labs on Admission: I have personally reviewed following labs and imaging studies  CBC: Recent Labs  Lab 08/31/20 1412  WBC 4.3  NEUTROABS 3.2  HGB 13.8  HCT 39.6  MCV 95.2  PLT 209    Basic Metabolic Panel: Recent Labs  Lab 08/31/20 1412  NA 129*  K 4.1  CL 94*  CO2 23  GLUCOSE 108*  BUN 11  CREATININE 0.82  CALCIUM 9.2    GFR: Estimated Creatinine Clearance: 60.8 mL/min (by C-G formula based on SCr of 0.82  mg/dL).  Liver Function Tests: Recent Labs  Lab 08/31/20 1412  AST 44*  ALT 23  ALKPHOS 64  BILITOT 1.0  PROT 6.9  ALBUMIN 3.8    Urine analysis: No results found for: COLORURINE, APPEARANCEUR, LABSPEC, PHURINE, GLUCOSEU, HGBUR, BILIRUBINUR, KETONESUR, PROTEINUR, UROBILINOGEN, NITRITE, LEUKOCYTESUR  Radiological Exams on Admission: DG Wrist Complete Left  Result Date: 08/31/2020 CLINICAL DATA:  Unwitnessed fall, pain. EXAM: LEFT WRIST - COMPLETE 3+ VIEW COMPARISON:  None. FINDINGS: No acute fracture. There is mild widening of the scapholunate interval of unknown acuity. Advanced osteoarthritis at the thumb carpal metacarpal joint. Mild radiocarpal osteoarthritis. Osteoarthritis of the metacarpal phalangeal joints most prominently involving the middle finger. There is chondrocalcinosis of the triangular fibrocartilage. There is no focal soft tissue abnormality. IMPRESSION: 1. No acute fracture of the left wrist. 2. Widening of the scapholunate interval of unknown acuity. 3. Multifocal osteoarthritis. Chondrocalcinosis of the triangular fibrocartilage. Electronically Signed   By: Narda Rutherford M.D.   On: 08/31/2020 17:48   DG Knee 2 Views Left  Result Date: 08/31/2020 CLINICAL DATA:  Recent fall with leg pain, initial encounter EXAM: LEFT KNEE - 1-2 VIEW COMPARISON:  None. FINDINGS: Left knee prosthesis is noted in satisfactory position. There is a fracture in the medial aspect of the distal femoral metaphysis with mild displacement  identified. Significant joint effusion is seen. No other focal abnormality is noted. IMPRESSION: Undisplaced distal left femoral fracture. Electronically Signed   By: Alcide Clever M.D.   On: 08/31/2020 13:15   CT Head Wo Contrast  Result Date: 08/31/2020 CLINICAL DATA:  Fall, head trauma. EXAM: CT HEAD WITHOUT CONTRAST TECHNIQUE: Contiguous axial images were obtained from the base of the skull through the vertex without intravenous contrast. COMPARISON:  None. FINDINGS: Brain: No hemorrhage. No extra-axial or subdural collection. There is equivocal loss of gray-white differentiation in the right occipital lobe versus artifact. Generalized atrophy moderate to advanced periventricular and deep white matter chronic small vessel ischemia. Bilateral basal gangliar mineralization is typically senescent. No hydrocephalus. Vascular: Atherosclerosis of skullbase vasculature without hyperdense vessel or abnormal calcification. Skull: No fracture or focal lesion. Sinuses/Orbits: Paranasal sinuses and mastoid air cells are clear. The visualized orbits are unremarkable. Frontal sinuses are hypo pneumatized. Other: None. IMPRESSION: 1. Equivocal loss of gray-white differentiation in the right occipital lobe versus artifact. Recommend correlation with neurologic symptoms. If there is clinical concern for acute infarct, recommend MRI for further evaluation. 2. No hemorrhage. 3. Generalized atrophy and chronic small vessel ischemia. Electronically Signed   By: Narda Rutherford M.D.   On: 08/31/2020 18:00   DG Chest Port 1 View  Result Date: 08/31/2020 CLINICAL DATA:  84 year old female with history of COVID-19. EXAM: PORTABLE CHEST 1 VIEW COMPARISON:  Chest x-ray 05/30/2016. FINDINGS: Lung volumes are normal. No consolidative airspace disease. No pleural effusions. No pneumothorax. No pulmonary nodule or mass noted. Pulmonary vasculature and the cardiomediastinal silhouette are within normal limits. IMPRESSION: No  radiographic evidence of acute cardiopulmonary disease. Electronically Signed   By: Trudie Reed M.D.   On: 08/31/2020 17:40   DG Shoulder Left  Result Date: 08/31/2020 CLINICAL DATA:  Pain after unwitnessed fall. EXAM: LEFT SHOULDER - 2+ VIEW COMPARISON:  None. FINDINGS: Portable AP and oblique images of the shoulder obtained. Limited assessment of glenohumeral alignment. Humeral head appears centered on the glenoid on the oblique view, however appears slightly anteriorly subluxed on AP view. No acute fracture. Degenerative change of the humeral head and acromioclavicular joint. IMPRESSION: Limited assessment of  glenohumeral alignment. This appears slightly anterior subluxed on the AP view, but humeral head centered in the glenoid on oblique. Recommend axillary view if patient is able. No acute fracture. Electronically Signed   By: Narda RutherfordMelanie  Sanford M.D.   On: 08/31/2020 17:47   DG Hip Unilat W or Wo Pelvis 1 View Left  Result Date: 08/31/2020 CLINICAL DATA:  Unwitnessed fall last night. Left hip pain. EXAM: DG HIP (WITH OR WITHOUT PELVIS) 1V*L* COMPARISON:  None. FINDINGS: AP view of the pelvis with AP view of the left hip. The cortical margins of the bony pelvis and left hip are intact. No fracture. Pubic symphysis and sacroiliac joints are congruent. Mild bilateral hip osteoarthritis. Both femoral heads are well-seated in the respective acetabula. IMPRESSION: No pelvic or left hip fracture. Electronically Signed   By: Narda RutherfordMelanie  Sanford M.D.   On: 08/31/2020 17:45    EKG: Independently reviewed.  Sinus rhythm, significant artifact in multiple leads worst at V1.  Assessment/Plan Principal Problem:   Closed fracture of left distal femur (HCC) Active Problems:   Alzheimer disease (HCC)   Asthma   Hyperlipidemia   Hypertension   Hyponatremia  Fall Closed fracture left distal femur > Patient presents after an unwitnessed fall and being found down at home by her family, unable to bear  weight on left due to pain. > Imaging in ED showed left distal femur fracture > EDP spoke with orthopedics, Dr. Sherlean FootLucey, who will see the patient in the morning > She was able to bear weight initially, so hopefully will be able to avoid surgery - Appreciate orthopedics assistance with this patient - Nonweightbearing while awaiting knee immobilizer and orthopedic evaluation - Knee immobilizer - Pain medication as needed - SCDs for now pending Ortho evaluation  Coronavirus infection > Known exposure of her son and his wife who help care for her.  No respiratory symptoms in the ED.  Did have fever 100.6 in ED. family reports that she has had about 1 week of fevers. > As patient has mild symptoms at this time that have been present for only 7 days and is at higher risk due to her age, asthma, and hypertension will order monoclonal antibody infusion. - Monoclonal antibody infusion protocol  Chronic hyponatremia > Sodium noted to be 129 in ED this is chronic for her with sodium typically around 130 - Continue to monitor  Chronic left shoulder pain and chronic low back pain > Does not take chronic pain meds at home - Continue to monitor  Alzheimer's dementia > Lives at home alone and is able to complete ADLs.  Does have family members who check in on her. > Able to carry out appropriate conversation, but does not remember the events of her fall. - Delirium precautions  Asthma - Start formulary Dulera twice daily - As needed albuterol  Hypertension > BP in the 140s to 150 systolic in ED - Continue home amlodipine and irbesartan  Hyperlipidemia - Continue home pravastatin  DVT prophylaxis: SCDs Code Status:   Full Family Communication:  Discussed with patient's daughter-in-law and son by phone at [phone].  Disposition Plan:   Patient is from:  Home  Anticipated DC to:  Pending clinical course  Anticipated DC date:  Pending clinical course  Anticipated DC  barriers: Alzheimer's disease  Consults called:  Orthopedics consulted by EDP, Dr. Sherlean FootLucey Admission status:  Observation, MedSurg  Severity of Illness: The appropriate patient status for this patient is OBSERVATION. Observation status is judged to be reasonable  and necessary in order to provide the required intensity of service to ensure the patient's safety. The patient's presenting symptoms, physical exam findings, and initial radiographic and laboratory data in the context of their medical condition is felt to place them at decreased risk for further clinical deterioration. Furthermore, it is anticipated that the patient will be medically stable for discharge from the hospital within 2 midnights of admission. The following factors support the patient status of observation.   " The patient's presenting symptoms include fall, leg pain. " The physical exam findings include leg pain. " The initial radiographic and laboratory data are consistent with distal femur fracture and asymptomatic Covid infection.   Synetta Fail MD [facility]  How to contact the [facility] Attending or Consulting provider 7A - 7P or covering provider during after hours 7P -7A, for this patient?   1. Check the care team in [facility] and look for a) attending/consulting [facility] provider listed and b) the [facility] team listed 2. Log into [email or web address] and use [facility]'s universal password to access. If you do not have the password, please contact the hospital operator. 3. Locate the [facility] provider you are looking for under [facility] and page to a number that you can be directly reached. 4. If you still have difficulty reaching the provider, please page the [facility] (Director on Call) for the Hospitalists listed on amion for assistance.  08/31/2020, 7:09 PM

## 2020-08-31 NOTE — Progress Notes (Addendum)
Received report from ER nurse. Arrived to room 5W-25 at this time. C/o pain Left arm, posterior lower head/neck area. Brace on Rt leg. Pulses 2+, cap refill <3 secs, full sensation BLE and BUE. Alert to person/DOB, and place.Denies chest pain, dizziness, shortness of breath. Bath completed. Pure wick placed . Skin dry w/ abrasion/ulceration area between last toe and second to last toe left foot. Call light in reach. Snack given and continuing to monitor.  Unable to complete admission history due to confusion/dementia.

## 2020-08-31 NOTE — ED Notes (Signed)
Back from CT

## 2020-08-31 NOTE — ED Notes (Signed)
Family is requesting an update Judeth Cornfield [phone]

## 2020-08-31 NOTE — ED Notes (Signed)
Off floor to CT 

## 2020-08-31 NOTE — ED Notes (Signed)
Pt admitted to 5W25; report called to [facility], [place].

## 2020-08-31 NOTE — ED Triage Notes (Signed)
Patient arrived via [facility]; from home. Reported patient had an unwitnessed fall last night and was found by family on the floor. Patient was then assisted back to bed by family and noticed difficulty bearing weight on left leg along w/ pain on knee, upper leg and hip. EMS endorsed + exposure to Covid, however denies symptoms.

## 2020-08-31 NOTE — ED Notes (Signed)
Called Ortho re: Knee Immobilizer.

## 2020-08-31 NOTE — ED Notes (Signed)
Pt is oriented to person, pllace and time. However didn't remember having her nose swabbed for covid nor being x-rayed.

## 2020-08-31 NOTE — ED Notes (Signed)
Sack lunch sent to floor with patient.  Transport here.

## 2020-08-31 NOTE — ED Provider Notes (Signed)
[facility] EMERGENCY DEPARTMENT Provider Note   CSN: [id number] Arrival date & time: 08/31/20  1245     History Chief Complaint  Patient presents with  . Fall    Amy Andrade is a 84 y.o. female.  She presented from home by EMS for evaluation of injuries from a fall.  She has a history of dementia, level 5 caveat.  Does not recall the fall.  Complaining of pain to left shoulder left wrist left knee.  Also reported exposure to Covid although patient does not recall this.  She was tested out at triage and she is Covid positive.  She does not remember if she was vaccinated.  The history is provided by the patient.  Fall This is a new problem. The current episode started yesterday. The problem occurs rarely. The problem has not changed since onset.Pertinent negatives include no chest pain, no abdominal pain, no headaches and no shortness of breath. The symptoms are aggravated by bending and twisting. Nothing relieves the symptoms. She has tried nothing for the symptoms. The treatment provided no relief.       Past Medical History:  Diagnosis Date  . Alzheimer disease (HCC)   . Asthma   . Hyperlipidemia   . Hypertension   . Pulmonary embolism (HCC) 2004   was short of breath--was on coumadin for 6 mos to a year    Patient Active Problem List   Diagnosis Date Noted  . Chronic left shoulder pain 04/25/2020  . Chronic bilateral low back pain without sciatica 04/25/2020  . Uncomplicated alcohol dependence (HCC) 04/25/2020  . Hyponatremia 05/31/2016  . Incidental lung nodule 05/30/2016  . History of pulmonary embolism 05/30/2016  . Alzheimer disease (HCC)   . Asthma   . Hyperlipidemia   . Hypertension   . Skin lesion of chest wall 06/22/2011    Past Surgical History:  Procedure Laterality Date  . ABDOMINAL HYSTERECTOMY    . BUNIONECTOMY    . REPLACEMENT TOTAL KNEE  1980s, 2003   x2     OB History   No obstetric history on file.     Family  History  Problem Relation Age of Onset  . Alzheimer's disease Mother   . Chronic bronchitis Mother   . Heart disease Father   . High blood pressure Father   . High blood pressure Brother   . Asthma Daughter   . High blood pressure Son   . High Cholesterol Son   . Post-traumatic stress disorder Son     Social History   Tobacco Use  . Smoking status: Former Smoker    Packs/day: 0.25    Years: 4.00    Pack years: 1.00    Types: Cigarettes    Quit date: 09/17/1957    Years since quitting: 62.9  . Smokeless tobacco: Never Used  Vaping Use  . Vaping Use: Never used  Substance Use Topics  . Alcohol use: Yes    Alcohol/week: 42.0 standard drinks    Types: 42 Glasses of wine per week  . Drug use: No    Home Medications Prior to Admission medications   Medication Sig Start Date End Date Taking? Authorizing Provider  albuterol (PROVENTIL HFA;VENTOLIN HFA) 108 (90 Base) MCG/ACT inhaler Inhale 2 puffs into the lungs every 6 (six) hours as needed for wheezing or shortness of breath. 10/22/16   Reed, Tiffany L, DO  amLODipine (NORVASC) 10 MG tablet TAKE 1 TABLET(10 MG) BY MOUTH DAILY 01/25/20   Bufford Spikes  L, DO  aspirin 81 MG tablet Take 81 mg by mouth daily.      [provider]  budesonide-formoterol (SYMBICORT) 160-4.5 MCG/ACT inhaler Inhale 2 puffs into the lungs 2 (two) times daily. Symbicort 120/4.5 1 puff 2 x a day 11/18/17   Reed, Tiffany L, DO  irbesartan (AVAPRO) 300 MG tablet TAKE 1 TABLET(300 MG) BY MOUTH DAILY 01/25/20   Reed, Tiffany L, DO  methocarbamol (ROBAXIN) 500 MG tablet TAKE 1 TABLET(500 MG) BY MOUTH TWICE DAILY AS NEEDED FOR MUSCLE SPASMS 08/15/20   Reed, Tiffany L, DO  nitroGLYCERIN (NITROSTAT) 0.4 MG SL tablet Place 1 tablet (0.4 mg total) under the tongue every 5 (five) minutes x 3 doses as needed for chest pain. 05/31/16   Ghimire, Werner Lean, MD  pravastatin (PRAVACHOL) 40 MG tablet TAKE 1 TABLET(40 MG) BY MOUTH DAILY 01/25/20   Reed, Tiffany L, DO   triamcinolone cream (KENALOG) 0.1 % Apply 1 application topically 2 (two) times daily. 04/25/20   Reed, Tiffany L, DO    Allergies    Patient has no known allergies.  Review of Systems   Review of Systems  Constitutional: Negative for fever.  HENT: Negative for sore throat.   Eyes: Negative for visual disturbance.  Respiratory: Negative for shortness of breath.   Cardiovascular: Negative for chest pain.  Gastrointestinal: Negative for abdominal pain and diarrhea.  Genitourinary: Negative for dysuria.  Musculoskeletal: Positive for gait problem. Negative for back pain.  Skin: Negative for rash.  Neurological: Negative for headaches.    Physical Exam Updated Vital Signs BP (!) 153/76 (BP Location: Left Arm)   Pulse 83   Temp (!) 100.6 F (38.1 C) (Oral)   Resp 18   Ht  (1.727 m)   Wt 86.2 kg   SpO2 96%   BMI 28.89 kg/m   Physical Exam Vitals and nursing note reviewed.  Constitutional:      General: She is not in acute distress.    Appearance: Normal appearance. She is well-developed and well-nourished.  HENT:     Head: Normocephalic and atraumatic.     Mouth/Throat:     Mouth: Oropharynx is clear and moist.  Eyes:     Conjunctiva/sclera: Conjunctivae normal.  Cardiovascular:     Rate and Rhythm: Normal rate and regular rhythm.     Pulses: Intact distal pulses.     Heart sounds: No murmur heard.   Pulmonary:     Effort: Pulmonary effort is normal. No respiratory distress.     Breath sounds: Normal breath sounds. No stridor. No wheezing.  Abdominal:     Palpations: Abdomen is soft.     Tenderness: There is no abdominal tenderness.  Musculoskeletal:        General: Tenderness present. No edema. Normal range of motion.     Cervical back: Neck supple.     Comments: Nontender and full range of motion of right upper and lower extremity.  She has tenderness left upper extremity at wrist and shoulder.  No gross deformities.  She has tenderness around her left  knee.  Ankle nontender.  No cervical thoracic or lumbar tenderness.  Skin:    General: Skin is warm and dry.     Capillary Refill: Capillary refill takes less than 2 seconds.  Neurological:     General: No focal deficit present.     Mental Status: She is alert. Mental status is at baseline. She is disoriented.     GCS: GCS eye subscore is 4.  GCS verbal subscore is 5. GCS motor subscore is 6.     Sensory: No sensory deficit.     Motor: No weakness.  Psychiatric:        Mood and Affect: Mood and affect normal.     ED Results / Procedures / Treatments   Labs (all labs ordered are listed, but only abnormal results are displayed) Labs Reviewed  RESP PANEL BY RT-PCR (FLU A&B, COVID) ARPGX2 - Abnormal; Notable for the following components:      Result Value   SARS Coronavirus 2 by RT PCR POSITIVE (*)    All other components within normal limits  CBC WITH DIFFERENTIAL/PLATELET - Abnormal; Notable for the following components:   Lymphs Abs 0.5 (*)    All other components within normal limits  COMPREHENSIVE METABOLIC PANEL - Abnormal; Notable for the following components:   Sodium 129 (*)    Chloride 94 (*)    Glucose, Bld 108 (*)    AST 44 (*)    All other components within normal limits  CBC - Abnormal; Notable for the following components:   WBC 3.2 (*)    All other components within normal limits  BASIC METABOLIC PANEL - Abnormal; Notable for the following components:   Sodium 129 (*)    Chloride 96 (*)    Glucose, Bld 104 (*)    All other components within normal limits  PROTIME-INR  C-REACTIVE PROTEIN  TYPE AND SCREEN  ABO/RH    EKG EKG Interpretation  Date/Time:  Wednesday August 31 2020 16:49:21 EST Ventricular Rate:  90 PR Interval:    QRS Duration: 87 QT Interval:  359 QTC Calculation: 440 R Axis:   4 Text Interpretation: Sinus rhythm Biatrial enlargement RSR' in V1 or V2, probably normal variant Minimal ST elevation, anterior leads Artifact in lead(s) I II  aVR aVL V1 No significant change since prior 9/17 Confirmed by Meridee Score [phone]) on 08/31/2020 4:52:52 PM   Radiology DG Wrist Complete Left  Result Date: 08/31/2020 CLINICAL DATA:  Unwitnessed fall, pain. EXAM: LEFT WRIST - COMPLETE 3+ VIEW COMPARISON:  None. FINDINGS: No acute fracture. There is mild widening of the scapholunate interval of unknown acuity. Advanced osteoarthritis at the thumb carpal metacarpal joint. Mild radiocarpal osteoarthritis. Osteoarthritis of the metacarpal phalangeal joints most prominently involving the middle finger. There is chondrocalcinosis of the triangular fibrocartilage. There is no focal soft tissue abnormality. IMPRESSION: 1. No acute fracture of the left wrist. 2. Widening of the scapholunate interval of unknown acuity. 3. Multifocal osteoarthritis. Chondrocalcinosis of the triangular fibrocartilage. Electronically Signed   By: Narda Rutherford M.D.   On: 08/31/2020 17:48   DG Knee 2 Views Left  Result Date: 08/31/2020 CLINICAL DATA:  Recent fall with leg pain, initial encounter EXAM: LEFT KNEE - 1-2 VIEW COMPARISON:  None. FINDINGS: Left knee prosthesis is noted in satisfactory position. There is a fracture in the medial aspect of the distal femoral metaphysis with mild displacement identified. Significant joint effusion is seen. No other focal abnormality is noted. IMPRESSION: Undisplaced distal left femoral fracture. Electronically Signed   By: Alcide Clever M.D.   On: 08/31/2020 13:15   CT Head Wo Contrast  Result Date: 08/31/2020 CLINICAL DATA:  Fall, head trauma. EXAM: CT HEAD WITHOUT CONTRAST TECHNIQUE: Contiguous axial images were obtained from the base of the skull through the vertex without intravenous contrast. COMPARISON:  None. FINDINGS: Brain: No hemorrhage. No extra-axial or subdural collection. There is equivocal loss of gray-white differentiation in the right  occipital lobe versus artifact. Generalized atrophy moderate to advanced  periventricular and deep white matter chronic small vessel ischemia. Bilateral basal gangliar mineralization is typically senescent. No hydrocephalus. Vascular: Atherosclerosis of skullbase vasculature without hyperdense vessel or abnormal calcification. Skull: No fracture or focal lesion. Sinuses/Orbits: Paranasal sinuses and mastoid air cells are clear. The visualized orbits are unremarkable. Frontal sinuses are hypo pneumatized. Other: None. IMPRESSION: 1. Equivocal loss of gray-white differentiation in the right occipital lobe versus artifact. Recommend correlation with neurologic symptoms. If there is clinical concern for acute infarct, recommend MRI for further evaluation. 2. No hemorrhage. 3. Generalized atrophy and chronic small vessel ischemia. Electronically Signed   By: Melanie  Sanford M.D.   On: 08/31/2020 18:00Narda Rutherford   DG Chest Port 1 View  Result Date: 08/31/2020 CLINICAL DATA:  84 year old female with history of COVID-19. EXAM: PORTABLE CHEST 1 VIEW COMPARISON:  Chest x-ray 05/30/2016. FINDINGS: Lung volumes are normal. No consolidative airspace disease. No pleural effusions. No pneumothorax. No pulmonary nodule or mass noted. Pulmonary vasculature and the cardiomediastinal silhouette are within normal limits. IMPRESSION: No radiographic evidence of acute cardiopulmonary disease. Electronically Signed   By: Trudie Reedaniel  Entrikin M.D.   On: 08/31/2020 17:40   DG Shoulder Left  Result Date: 08/31/2020 CLINICAL DATA:  Pain after unwitnessed fall. EXAM: LEFT SHOULDER - 2+ VIEW COMPARISON:  None. FINDINGS: Portable AP and oblique images of the shoulder obtained. Limited assessment of glenohumeral alignment. Humeral head appears centered on the glenoid on the oblique view, however appears slightly anteriorly subluxed on AP view. No acute fracture. Degenerative change of the humeral head and acromioclavicular joint. IMPRESSION: Limited assessment of glenohumeral alignment. This appears slightly anterior  subluxed on the AP view, but humeral head centered in the glenoid on oblique. Recommend axillary view if patient is able. No acute fracture. Electronically Signed   By: Narda RutherfordMelanie  Sanford M.D.   On: 08/31/2020 17:47   DG Hip Unilat W or Wo Pelvis 1 View Left  Result Date: 08/31/2020 CLINICAL DATA:  Unwitnessed fall last night. Left hip pain. EXAM: DG HIP (WITH OR WITHOUT PELVIS) 1V*L* COMPARISON:  None. FINDINGS: AP view of the pelvis with AP view of the left hip. The cortical margins of the bony pelvis and left hip are intact. No fracture. Pubic symphysis and sacroiliac joints are congruent. Mild bilateral hip osteoarthritis. Both femoral heads are well-seated in the respective acetabula. IMPRESSION: No pelvic or left hip fracture. Electronically Signed   By: Narda RutherfordMelanie  Sanford M.D.   On: 08/31/2020 17:45    Procedures Procedures (including critical care time)  Medications Ordered in ED Medications  aspirin EC tablet 81 mg (81 mg Oral Given 09/01/20 0850)  pravastatin (PRAVACHOL) tablet 40 mg (40 mg Oral Given 09/01/20 0851)  irbesartan (AVAPRO) tablet 300 mg (300 mg Oral Given 09/01/20 0850)  amLODipine (NORVASC) tablet 10 mg (10 mg Oral Given 09/01/20 0851)  mometasone-formoterol (DULERA) 200-5 MCG/ACT inhaler 2 puff (2 puffs Inhalation Given 09/01/20 0414)  albuterol (VENTOLIN HFA) 108 (90 Base) MCG/ACT inhaler 2 puff (has no administration in time range)  HYDROcodone-acetaminophen (NORCO/VICODIN) 5-325 MG per tablet 1 tablet (1 tablet Oral Given 09/01/20 0900)  polyethylene glycol (MIRALAX / GLYCOLAX) packet 17 g (has no administration in time range)  0.9 %  sodium chloride infusion (has no administration in time range)  diphenhydrAMINE (BENADRYL) injection 50 mg (has no administration in time range)  famotidine (PEPCID) IVPB 20 mg premix (has no administration in time range)  methylPREDNISolone sodium succinate (SOLU-MEDROL) 125 mg/2 mL  injection 125 mg (has no administration in time range)   albuterol (VENTOLIN HFA) 108 (90 Base) MCG/ACT inhaler 2 puff (has no administration in time range)  EPINEPHrine (EPI-PEN) injection 0.3 mg (has no administration in time range)  enoxaparin (LOVENOX) injection 40 mg (has no administration in time range)  famotidine (PEPCID) tablet 20 mg (has no administration in time range)  bamlanivimab 700 mg, etesevimab 1,400 mg in sodium chloride 0.9 % 160 mL IVPB ( Intravenous New Bag/Given 09/01/20 0411)    ED Course  I have reviewed the triage vital signs and the nursing notes.  Pertinent labs & imaging results that were available during my care of the patient were reviewed by me and considered in my medical decision making (see chart for details).  Clinical Course as of 09/01/20 1139  Wed Aug 31, 2020  1738 Patient had x-rays of pelvis left hip left shoulder left wrist chest x-ray left knee.  So far the only definitive fracture can identify is a distal left femur nondisplaced fracture with intact knee hardware.  Awaiting radiology reading. [MB]  1805 I called the patient's son and daughter-in-law who are listed as her contacts.  They said her son speaking with tested positive for Covid about 12 days ago.  She had been experiencing no symptoms.  They do not recall if she had ever seen an orthopedic physician.  I updated them on the findings and the plan for probable admission. [MB]  1836 Discussed with Dr. Valentina Gu from orthopedics.  He recommends knee immobilizer and will consult on her. [MB]  1857 Discussed with [facility] hospitalist Dr. Alinda Money who will evaluate the patient for admission. [MB]  1900 Family did confirm that she has not been Covid vaccinated.  This is due to some sort of clotting disorder. [MB]    Clinical Course User Index [MB] Terrilee Files, MD   MDM Rules/Calculators/A&P                         Beulah Capobianco Bentson was evaluated in Emergency Department on 08/31/2020 for the symptoms described in the history of present illness. She was  evaluated in the context of the global COVID-19 pandemic, which necessitated consideration that the patient might be at risk for infection with the SARS-CoV-2 virus that causes COVID-19. Institutional protocols and algorithms that pertain to the evaluation of patients at risk for COVID-19 are in a state of rapid change based on information released by regulatory bodies including the CDC and federal and state organizations. These policies and algorithms were followed during the patient's care in the ED.  This patient complains of fall, left knee pain, Covid exposure; this involves an extensive number of treatment Options and is a complaint that carries with it a high risk of complications and Morbidity. The differential includes Covid, fracture, dislocation, unsteady gait, intracranial bleed  I ordered, reviewed and interpreted labs, which included CBC with normal white count normal hemoglobin, chemistries with low sodium Izola Price, Covid testing positive, normal coags I ordered imaging studies which included CT head, chest x-ray, left knee x-ray, left hip and pelvis, left shoulder, left wrist and I independently    visualized and interpreted imaging which showed periprosthetic fracture left knee Additional history obtained from patient son and daughter-in-law Previous records obtained and reviewed in epic, no recent admissions I consulted Dr. Sherlean Foot orthopedics and Dr. Alinda Money [facility] hospitalist and discussed lab and imaging findings  Critical Interventions: None  After the interventions stated above, I  reevaluated the patient and found patient to be minimally symptomatic unless she is moved.  She will need to be admitted to the hospital for further orthopedic evaluation discussed with Dr. Alinda Money patient's asymptomatic Covid infection.  He will arrange for the patient to get monoclonal antibodies.   Final Clinical Impression(s) / ED Diagnoses Final diagnoses:  Fall, initial encounter  Closed fracture  of distal end of left femur, unspecified fracture morphology, initial encounter (HCC)  COVID-19 virus infection    Rx / DC Orders ED Discharge Orders    None       Terrilee Files, MD 09/01/20 1145

## 2020-09-01 DIAGNOSIS — Z7982 Long term (current) use of aspirin: Secondary | ICD-10-CM | POA: Diagnosis not present

## 2020-09-01 DIAGNOSIS — Z86718 Personal history of other venous thrombosis and embolism: Secondary | ICD-10-CM | POA: Diagnosis not present

## 2020-09-01 DIAGNOSIS — E871 Hypo-osmolality and hyponatremia: Secondary | ICD-10-CM | POA: Diagnosis present

## 2020-09-01 DIAGNOSIS — F0280 Dementia in other diseases classified elsewhere without behavioral disturbance: Secondary | ICD-10-CM | POA: Diagnosis present

## 2020-09-01 DIAGNOSIS — E785 Hyperlipidemia, unspecified: Secondary | ICD-10-CM | POA: Diagnosis present

## 2020-09-01 DIAGNOSIS — Z86711 Personal history of pulmonary embolism: Secondary | ICD-10-CM | POA: Diagnosis not present

## 2020-09-01 DIAGNOSIS — S7292XA Unspecified fracture of left femur, initial encounter for closed fracture: Secondary | ICD-10-CM | POA: Diagnosis present

## 2020-09-01 DIAGNOSIS — G9341 Metabolic encephalopathy: Secondary | ICD-10-CM | POA: Diagnosis not present

## 2020-09-01 DIAGNOSIS — S72302A Unspecified fracture of shaft of left femur, initial encounter for closed fracture: Secondary | ICD-10-CM | POA: Diagnosis present

## 2020-09-01 DIAGNOSIS — G309 Alzheimer's disease, unspecified: Secondary | ICD-10-CM

## 2020-09-01 DIAGNOSIS — G8929 Other chronic pain: Secondary | ICD-10-CM | POA: Diagnosis present

## 2020-09-01 DIAGNOSIS — U071 COVID-19: Principal | ICD-10-CM

## 2020-09-01 DIAGNOSIS — Y92013 Bedroom of single-family (private) house as the place of occurrence of the external cause: Secondary | ICD-10-CM | POA: Diagnosis not present

## 2020-09-01 DIAGNOSIS — Z23 Encounter for immunization: Secondary | ICD-10-CM | POA: Diagnosis not present

## 2020-09-01 DIAGNOSIS — S72402A Unspecified fracture of lower end of left femur, initial encounter for closed fracture: Secondary | ICD-10-CM | POA: Diagnosis present

## 2020-09-01 DIAGNOSIS — M545 Low back pain, unspecified: Secondary | ICD-10-CM | POA: Diagnosis present

## 2020-09-01 DIAGNOSIS — I10 Essential (primary) hypertension: Secondary | ICD-10-CM | POA: Diagnosis present

## 2020-09-01 DIAGNOSIS — J45909 Unspecified asthma, uncomplicated: Secondary | ICD-10-CM | POA: Diagnosis present

## 2020-09-01 DIAGNOSIS — Z820 Family history of epilepsy and other diseases of the nervous system: Secondary | ICD-10-CM | POA: Diagnosis not present

## 2020-09-01 DIAGNOSIS — M25512 Pain in left shoulder: Secondary | ICD-10-CM | POA: Diagnosis present

## 2020-09-01 DIAGNOSIS — Z87891 Personal history of nicotine dependence: Secondary | ICD-10-CM | POA: Diagnosis not present

## 2020-09-01 DIAGNOSIS — Z90710 Acquired absence of both cervix and uterus: Secondary | ICD-10-CM | POA: Diagnosis not present

## 2020-09-01 DIAGNOSIS — W010XXA Fall on same level from slipping, tripping and stumbling without subsequent striking against object, initial encounter: Secondary | ICD-10-CM | POA: Diagnosis present

## 2020-09-01 DIAGNOSIS — Z7951 Long term (current) use of inhaled steroids: Secondary | ICD-10-CM | POA: Diagnosis not present

## 2020-09-01 DIAGNOSIS — Z825 Family history of asthma and other chronic lower respiratory diseases: Secondary | ICD-10-CM | POA: Diagnosis not present

## 2020-09-01 DIAGNOSIS — Z8249 Family history of ischemic heart disease and other diseases of the circulatory system: Secondary | ICD-10-CM | POA: Diagnosis not present

## 2020-09-01 DIAGNOSIS — Z79899 Other long term (current) drug therapy: Secondary | ICD-10-CM | POA: Diagnosis not present

## 2020-09-01 DIAGNOSIS — J4520 Mild intermittent asthma, uncomplicated: Secondary | ICD-10-CM | POA: Diagnosis not present

## 2020-09-01 DIAGNOSIS — M9712XA Periprosthetic fracture around internal prosthetic left knee joint, initial encounter: Secondary | ICD-10-CM | POA: Diagnosis present

## 2020-09-01 LAB — CBC
HCT: 37 % (ref 36.0–46.0)
Hemoglobin: 12.6 g/dL (ref 12.0–15.0)
MCH: 32.5 pg (ref 26.0–34.0)
MCHC: 34.1 g/dL (ref 30.0–36.0)
MCV: 95.4 fL (ref 80.0–100.0)
Platelets: 180 10*3/uL (ref 150–400)
RBC: 3.88 MIL/uL (ref 3.87–5.11)
RDW: 12.5 % (ref 11.5–15.5)
WBC: 3.2 10*3/uL — ABNORMAL LOW (ref 4.0–10.5)
nRBC: 0 % (ref 0.0–0.2)

## 2020-09-01 LAB — BASIC METABOLIC PANEL
Anion gap: 11 (ref 5–15)
BUN: 13 mg/dL (ref 8–23)
CO2: 22 mmol/L (ref 22–32)
Calcium: 8.9 mg/dL (ref 8.9–10.3)
Chloride: 96 mmol/L — ABNORMAL LOW (ref 98–111)
Creatinine, Ser: 0.81 mg/dL (ref 0.44–1.00)
GFR, Estimated: >60 mL/min (ref >60)
Glucose, Bld: 104 mg/dL — ABNORMAL HIGH (ref 70–99)
Potassium: 3.9 mmol/L (ref 3.5–5.1)
Sodium: 129 mmol/L — ABNORMAL LOW (ref 135–145)

## 2020-09-01 LAB — C-REACTIVE PROTEIN: CRP: 4.4 mg/dL — ABNORMAL HIGH (ref <1.0)

## 2020-09-01 MED ORDER — ENOXAPARIN SODIUM 40 MG/0.4ML ~~LOC~~ SOLN
40.0000 mg | SUBCUTANEOUS | Status: DC
  Administered 2020-09-01 – 2020-09-05 (×5): 40 mg via SUBCUTANEOUS
  Filled 2020-09-01 (×5): qty 0.4

## 2020-09-01 MED ORDER — MORPHINE SULFATE (PF) 4 MG/ML IV SOLN
3.0000 mg | INTRAVENOUS | Status: DC | PRN
  Administered 2020-09-01 – 2020-09-04 (×2): 3 mg via INTRAVENOUS
  Filled 2020-09-01 (×2): qty 1

## 2020-09-01 MED ORDER — FAMOTIDINE 20 MG PO TABS
20.0000 mg | ORAL_TABLET | Freq: Every day | ORAL | Status: DC
  Administered 2020-09-01 – 2020-09-05 (×5): 20 mg via ORAL
  Filled 2020-09-01 (×5): qty 1

## 2020-09-01 NOTE — Progress Notes (Signed)
PT Cancellation Note  Patient Details Name: Amy Andrade MRN: [id number] DOB: 11-12-1935   Cancelled Treatment:    Reason Eval/Treat Not Completed: Active bedrest order, pt with L femur fx. Will await orthopedic consult for formal recommendations. Please remove bedrest orders when pt appropriate to participate in PT Evaluation.  Ina Homes, PT, DPT Acute Rehabilitation Services  Pager [phone] Office [phone]  Amy Andrade 09/01/2020, 7:51 AM

## 2020-09-01 NOTE — Evaluation (Signed)
Occupational Therapy Evaluation Patient Details Name: Amy Andrade MRN: [id number] DOB: [birth year] Today's Date: 09/01/2020    History of Present Illness Pt is an 84 y.o. female admitted 08/31/20 after unwitnessed fall at home, initially able to walk but with worsening pain then unable to mobilize; workup revealed L distal femur fx. Head CT negative for acute abnormality. Awaiting orthopedic consult. PMH includes Alzheimer dementia, asthma, chronic pain, HTN.   Clinical Impression   PTA pt living alone, functioning at mod I-supervision level with use of RW. Daughter in law, Judeth Cornfield, contacted to provide pt PLOF due to pts cognitive status. She reports her and her husband live 4 houses down from patient and come in daily to check on her by fixing meals, assisting with showers, and managing all IADLs. Daughter in law reports pt was ambulatory with RW without falls other than one leading to this admission. At time of eval, pt able to complete bed mobility at total A +2 level. She is currently limited 2/2 pain to further progress mobility. Pt able to tolerate sitting EOB ~3 mins before needing to lie back down. She has a history of dementia with mod/max cognitive deficits in memory, problem solving, and safety. Given current status, recommend SNF at d/c for continued progression of ADL prior to d/c home. Family plans to move in with pt after d/c from SNF. Will continue to follow per POC listed below.    Follow Up Recommendations  SNF;Supervision/Assistance - 24 hour    Equipment Recommendations  3 in 1 bedside commode;Wheelchair (measurements OT);Wheelchair cushion (measurements OT);Hospital bed    Recommendations for Other Services       Precautions / Restrictions Precautions Precautions: Fall Required Braces or Orthoses: Knee Immobilizer - Left Knee Immobilizer - Left: On at all times Restrictions Weight Bearing Restrictions: Yes LLE Weight Bearing: Touchdown weight bearing       Mobility Bed Mobility Overal bed mobility: Needs Assistance Bed Mobility: Supine to Sit;Sit to Supine     Supine to sit: Total assist;+2 for physical assistance;+2 for safety/equipment Sit to supine: Total assist;+2 for physical assistance;+2 for safety/equipment   General bed mobility comments: pt fearful of movement and hollers out in slightest amount of pain. Requires total A of PT to help with BLEs to EOB and OT to help with trunk support in helicopter style with bed pads    Transfers                 General transfer comment: unable 2/2 pain    Balance Overall balance assessment: Needs assistance Sitting-balance support: Feet supported;Bilateral upper extremity supported Sitting balance-Leahy Scale: Poor Sitting balance - Comments: bracing self with UEs 2/2 pain. Requires intermittent min-mod trunk support                                   ADL either performed or assessed with clinical judgement   ADL Overall ADL's : Needs assistance/impaired Eating/Feeding: Set up;Bed level   Grooming: Set up;Bed level   Upper Body Bathing: Moderate assistance;Sitting   Lower Body Bathing: Total assistance;Sitting/lateral leans;Sit to/from stand   Upper Body Dressing : Moderate assistance;Sitting   Lower Body Dressing: Total assistance;Sitting/lateral leans;Sit to/from stand Lower Body Dressing Details (indicate cue type and reason): to don bil socks               General ADL Comments: Pt unable to tolerate OOB transfers 2/2 pain this date. Required  total A +2 to sit EOB     Vision Baseline Vision/History: Wears glasses Wears Glasses: At all times Patient Visual Report: No change from baseline       Perception     Praxis      Pertinent Vitals/Pain Pain Assessment: Faces Faces Pain Scale: Hurts whole lot Pain Location: LLE with movement Pain Descriptors / Indicators: Aching;Sore;Moaning Pain Intervention(s): Limited activity within patient's  tolerance;Monitored during session;Repositioned;Relaxation     Hand Dominance     Extremity/Trunk Assessment Upper Extremity Assessment Upper Extremity Assessment: Generalized weakness;LUE deficits/detail LUE Deficits / Details: pain limiting full ROM/functional use of UE in transfers. Possible baseline?   Lower Extremity Assessment Lower Extremity Assessment: Defer to PT evaluation       Communication Communication Communication: No difficulties   Cognition Arousal/Alertness: Awake/alert Behavior During Therapy: WFL for tasks assessed/performed Overall Cognitive Status: History of cognitive impairments - at baseline Area of Impairment: Safety/judgement;Memory;Problem solving                     Memory: Decreased short-term memory;Decreased recall of precautions   Safety/Judgement: Decreased awareness of safety;Decreased awareness of deficits   Problem Solving: Slow processing;Decreased initiation General Comments: hx of alzheimers dementia at baseline. Poor recall/historian. Is very pleasant and redirectable, but does scream out in slightest amount of pain   General Comments       Exercises     Shoulder Instructions      Home Living Family/patient expects to be discharged to:: Private residence Living Arrangements: Alone (children 4 houses down. there all the time) Available Help at Discharge: Family;Available 24 hours/day Type of Home: House       Home Layout: Two level;Able to live on main level with bedroom/bathroom     Bathroom Shower/Tub: Producer, television/film/video: Handicapped height     Home Equipment: Environmental consultant - 2 wheels   Additional Comments: Spoke with daughter in law to confirm above pt info      Prior Functioning/Environment Level of Independence: Independent with assistive device(s)        Comments: Pt uses RW for mobility. Daughter in law assist with showers. Between daughter in law and son, all IADLs are managed for pt         OT Problem List: Decreased strength;Decreased knowledge of use of DME or AE;Decreased knowledge of precautions;Decreased activity tolerance;Decreased cognition;Impaired balance (sitting and/or standing);Decreased safety awareness;Pain      OT Treatment/Interventions: Self-care/ADL training;Therapeutic exercise;Patient/family education;Balance training;Energy conservation;Therapeutic activities;DME and/or AE instruction;Cognitive remediation/compensation    OT Goals(Current goals can be found in the care plan section) Acute Rehab OT Goals Patient Stated Goal: reduce pain OT Goal Formulation: With patient Time For Goal Achievement: 09/15/20 Potential to Achieve Goals: Good  OT Frequency: Min 2X/week   Barriers to D/C:            Co-evaluation PT/OT/SLP Co-Evaluation/Treatment: Yes Reason for Co-Treatment: For patient/therapist safety;To address functional/ADL transfers   OT goals addressed during session: ADL's and self-care;Strengthening/ROM      AM-PAC OT "6 Clicks" Daily Activity     Outcome Measure Help from another person eating meals?: A Little Help from another person taking care of personal grooming?: A Little Help from another person toileting, which includes using toliet, bedpan, or urinal?: Total Help from another person bathing (including washing, rinsing, drying)?: Total Help from another person to put on and taking off regular upper body clothing?: A Lot Help from another person to put on and taking off  regular lower body clothing?: Total 6 Click Score: 11   End of Session Nurse Communication: Mobility status  Activity Tolerance: Patient limited by pain Patient left: in bed;with call bell/phone within reach;with bed alarm set  OT Visit Diagnosis: Unsteadiness on feet (R26.81);History of falling (Z91.81);Pain;Other symptoms and signs involving cognitive function;Muscle weakness (generalized) (M62.81) Pain - Right/Left: Left Pain - part of body: Leg;Hip                 Time: 1410-1440 OT Time Calculation (min): 30 min Charges:  OT General Charges $OT Visit: 1 Visit OT Evaluation $OT Eval Moderate Complexity: 1 Mod  Dalphine Handing, MSOT, OTR/L Acute Rehabilitation Services [facility] Office Number: [phone] Pager: [phone]  Dalphine Handing 09/01/2020, 5:45 PM

## 2020-09-01 NOTE — TOC Initial Note (Signed)
Transition of Care [facility]) - Initial/Assessment Note    Patient Details  Name: LISVET RASHEED MRN: [id number] Date of Birth: 1936/09/04  [facility]) CM/SW Contact:    Lockie Pares, RN Phone Number: 09/01/2020, 8:59 AM  Clinical Narrative:                 Admitted under observation for femoral fracture after a fall at home. , of note also COVID positive, with fever, receiving monoclonal antibodies. Patient has baseline dementia, lives alone but family checks on frequently, had exposure to COVID via children. Is currently non-weight bearing , awaiting formal orthopedic consultation. Knee immobilizer on affected extremity, . PT evaluation post ortho for recommendations Will likely require short term rehab SNF stay. CSW, CM to follow for discharge needs. .    Expected Discharge Plan: Skilled Nursing Facility Barriers to Discharge: Continued Medical Work up   Patient Goals and CMS Choice        Expected Discharge Plan and Services Expected Discharge Plan: Skilled Nursing Facility In-house Referral: Clinical Social Work Discharge Planning Services: CM Consult   Living arrangements for the past 2 months: Single Family Home                                      Prior Living Arrangements/Services Living arrangements for the past 2 months: Single Family Home Lives with:: Self Patient language and need for interpreter reviewed:: Yes        Need for Family Participation in Patient Care: Yes (Comment) Care giver support system in place?: Yes (comment) Current home services: DME (walker) Criminal Activity/Legal Involvement Pertinent to Current Situation/Hospitalization: No - Comment as needed  Activities of Daily Living      Permission Sought/Granted                  Emotional Assessment       Orientation: : Fluctuating Orientation (Suspected and/or reported Sundowners) Alcohol / Substance Use: Not Applicable Psych Involvement: No  (comment)  Admission diagnosis:  Closed fracture of left distal femur (HCC) [S72.402A] Fall, initial encounter [W19.XXXA] Closed fracture of distal end of left femur, unspecified fracture morphology, initial encounter (HCC) [S72.402A] COVID-19 virus infection [U07.1] Patient Active Problem List   Diagnosis Date Noted  . Closed fracture of left distal femur (HCC) 08/31/2020  . Coronavirus infection 08/31/2020  . Chronic left shoulder pain 04/25/2020  . Chronic bilateral low back pain without sciatica 04/25/2020  . Uncomplicated alcohol dependence (HCC) 04/25/2020  . Hyponatremia 05/31/2016  . Incidental lung nodule 05/30/2016  . History of pulmonary embolism 05/30/2016  . Alzheimer disease (HCC)   . Asthma   . Hyperlipidemia   . Hypertension   . Skin lesion of chest wall 06/22/2011   PCP:  Kermit Balo, DO Pharmacy:   [facility] [phone] - [place] Otto, [place] - [address] AT [facility] [address] [place] [address] Phone: [phone] Fax: [phone]     Social Determinants of Health (SDOH) Interventions    Readmission Risk Interventions No flowsheet data found.

## 2020-09-01 NOTE — Evaluation (Signed)
Physical Therapy Evaluation Patient Details Name: Amy Andrade MRN: [id number] DOB: [birth year] Today's Date: 09/01/2020   History of Present Illness  Pt is an 84 y.o. female admitted 08/31/20 after unwitnessed fall at home, initially able to walk but with worsening pain then unable to mobilize; workup revealed L distal femur fx (plan for non-operative management). Head CT negative for acute abnormality. PMH includes Alzheimer dementia, asthma, chronic pain, HTN.    Clinical Impression  Pt presents with an overall decrease in functional mobility secondary to above. PTA, pt mod indep ambulating with intermittent use of RW, lives alone with family nearby who assist daily. Today, pt requiring max-totalA+2 for mobility, unable to progress to standing, limited by significant LLE pain with any movement, as well as generalized weakness and back pain. Educ on precautions, positioning, and importance of mobility. Pt would benefit from continued acute PT services to maximize functional mobility and independence prior to d/c with SNF-level therapies; pt and family in agreement.     Follow Up Recommendations SNF;Supervision/Assistance - 24 hour    Equipment Recommendations  3in1 (PT);Wheelchair (measurements PT);Wheelchair cushion (measurements PT);Hospital bed    Recommendations for Other Services       Precautions / Restrictions Precautions Precautions: Fall;Other (comment) Precaution Comments: Bladder incontinence/frequency Required Braces or Orthoses: Knee Immobilizer - Left Knee Immobilizer - Left: On at all times Restrictions Weight Bearing Restrictions: Yes LLE Weight Bearing: Touchdown weight bearing      Mobility  Bed Mobility Overal bed mobility: Needs Assistance Bed Mobility: Supine to Sit;Sit to Supine;Rolling Rolling: Max assist;+2 for physical assistance   Supine to sit: Total assist;+2 for physical assistance;+2 for safety/equipment Sit to supine: Total assist;+2 for  physical assistance;+2 for safety/equipment   General bed mobility comments: pt fearful of movement and yells out in slightest amount of pain. Requires total A of PT to help with BLEs to EOB and OT to help with trunk support in helicopter style with bed pads. MaxA+2 to roll R/L for pericare/washup and linen change due to bladder incontinence, pt assist with bed rail, limited by pain    Transfers                 General transfer comment: unable 2/2 pain  Ambulation/Gait                Stairs            Wheelchair Mobility    Modified Rankin (Stroke Patients Only)       Balance Overall balance assessment: Needs assistance Sitting-balance support: Feet supported;Bilateral upper extremity supported Sitting balance-Leahy Scale: Poor Sitting balance - Comments: bracing self with UEs 2/2 pain. Requires intermittent min-mod trunk support                                     Pertinent Vitals/Pain Pain Assessment: Faces Faces Pain Scale: Hurts whole lot Pain Location: LLE with movement Pain Descriptors / Indicators: Aching;Sore;Moaning Pain Intervention(s): Limited activity within patient's tolerance;Monitored during session;Repositioned;RN gave pain meds during session    Home Living Family/patient expects to be discharged to:: Skilled nursing facility Living Arrangements: Alone Available Help at Discharge: Family;Available 24 hours/day Type of Home: House       Home Layout: Two level;Able to live on main level with bedroom/bathroom Home Equipment: Dan Humphreys - 2 wheels Additional Comments: Spoke with daughter in law to confirm above pt info. Son and daughter-in-law live four houses  down from pt and visit daily    Prior Function Level of Independence: Independent with assistive device(s)         Comments: Pt uses RW for mobility. Daughter in law assist with showers. Between daughter in law and son, all IADLs are managed for pt     Hand  Dominance        Extremity/Trunk Assessment   Upper Extremity Assessment Upper Extremity Assessment: Generalized weakness;LUE deficits/detail LUE Deficits / Details: pain limiting full shoulder ROM/functional use of UE in transfers. Possible baseline?    Lower Extremity Assessment Lower Extremity Assessment: Generalized weakness;LLE deficits/detail LLE Deficits / Details: s/p L femur fx; hip and knee functionally <3/5 throughout, limited by weakness and significant pain LLE: Unable to fully assess due to pain;Unable to fully assess due to immobilization LLE Coordination: decreased gross motor       Communication   Communication: No difficulties  Cognition Arousal/Alertness: Awake/alert Behavior During Therapy: WFL for tasks assessed/performed Overall Cognitive Status: History of cognitive impairments - at baseline Area of Impairment: Safety/judgement;Memory;Problem solving                     Memory: Decreased short-term memory;Decreased recall of precautions   Safety/Judgement: Decreased awareness of safety;Decreased awareness of deficits   Problem Solving: Slow processing;Decreased initiation General Comments: hx of alzheimers dementia at baseline. Poor recall/historian. Is very pleasant and redirectable, but does scream out in slightest amount of pain      General Comments General comments (skin integrity, edema, etc.): OT spoke with daughter on phone regarding PLOF and d/c planning    Exercises     Assessment/Plan    PT Assessment Patient needs continued PT services  PT Problem List Decreased strength;Decreased range of motion;Decreased activity tolerance;Decreased balance;Decreased mobility;Decreased cognition;Decreased knowledge of use of DME;Decreased knowledge of precautions;Pain       PT Treatment Interventions DME instruction;Gait training;Functional mobility training;Therapeutic activities;Therapeutic exercise;Balance training;Patient/family  education;Wheelchair mobility training;Cognitive remediation    PT Goals (Current goals can be found in the Care Plan section)  Acute Rehab PT Goals Patient Stated Goal: reduce pain PT Goal Formulation: With patient Time For Goal Achievement: 09/15/20 Potential to Achieve Goals: Fair    Frequency Min 3X/week   Barriers to discharge        Co-evaluation PT/OT/SLP Co-Evaluation/Treatment: Yes Reason for Co-Treatment: Necessary to address cognition/behavior during functional activity;For patient/therapist safety;To address functional/ADL transfers PT goals addressed during session: Mobility/safety with mobility;Balance OT goals addressed during session: ADL's and self-care;Strengthening/ROM       AM-PAC PT "6 Clicks" Mobility  Outcome Measure Help needed turning from your back to your side while in a flat bed without using bedrails?: A Lot Help needed moving from lying on your back to sitting on the side of a flat bed without using bedrails?: Total Help needed moving to and from a bed to a chair (including a wheelchair)?: Total Help needed standing up from a chair using your arms (e.g., wheelchair or bedside chair)?: Total Help needed to walk in hospital room?: Total Help needed climbing 3-5 steps with a railing? : Total 6 Click Score: 7    End of Session   Activity Tolerance: Patient limited by pain Patient left: in bed;with call bell/phone within reach;with bed alarm set Nurse Communication: Mobility status;Need for lift equipment PT Visit Diagnosis: Other abnormalities of gait and mobility (R26.89);Pain Pain - Right/Left: Left Pain - part of body: Leg    Time: 1410-1440 PT Time Calculation (min) (ACUTE ONLY):  30 min   Charges:   PT Evaluation $PT Eval Moderate Complexity: 1 Mod     Ina Homes, PT, DPT Acute Rehabilitation Services  Pager [phone] Office [phone]  Malachy Chamber 09/01/2020, 5:57 PM

## 2020-09-01 NOTE — Progress Notes (Signed)
PROGRESS NOTE                                                                                                                                                                                                             Patient Demographics:    Amy Andrade, is a 84 y.o. female, DOB - 28-Oct-1935, UXN:[id number]  Outpatient Primary MD for the patient is Amy Balo, DO   Admit date - 08/31/2020   LOS - 0  Chief Complaint  Patient presents with  . Fall       Brief Narrative: Patient is a 84 y.o. female with PMHx of dementia, HTN, asthma, chronic pain, chronic hyponatremia-presented to the hospital following episode of fall-found to have left femur fracture and incidental COVID-19 infection.  COVID-19 vaccinated status: Unvaccinated  Significant Events: 12/15>> Admit to [facility] for left femur fracture, incidental COVID-19 infection  Significant studies: 12/15>> x-ray hip left: No pelvic/hip fracture 12/15>> x-ray left knee: Undisplaced distal left femoral fracture 12/15>> x-ray left shoulder: No acute fracture 12/15>> x-ray left wrist: No acute fracture 12/15>> CT head: No acute abnormalities 12/15>>Chest x-ray: No pneumonia  COVID-19 medications: 12/15>> monoclonal antibody x1  Antibiotics: None  Microbiology data: None  Procedures: None  Consults: None  DVT prophylaxis: Place and maintain sequential compression device Start: 08/31/20 1917 SQ Lovenox    Subjective:    Amy Andrade today remains pleasantly confused-able to answer most of my questions appropriately.  Does not remember falling.   Assessment  & Plan :   Left distal femur fracture: Following a fall-await orthopedics evaluation-spoke with Amy [place], PA-C.  HTN: Stable-continue amlodipine/irbesartan.  HLD: Continue statin  Asthma: Stable-not wheezing-continue Dulera, and as needed albuterol.  Chronic left shoulder/low  back pain: Stable-supportive care.  Hyponatremia: Mild-chronic-stable for follow-up.  History of PE/VTE: No longer on anticoagulation per patient report.  Start prophylactic Lovenox  COVID-19 infection: Asymptomatic-s/p monoclonal antibody infusion on 12/15. Son has COVID as well.  Fever: afebrile O2 requirements:  SpO2: 95 %   COVID-19 Labs: No results for input(s): DDIMER, FERRITIN, LDH, CRP in the last 72 hours.  No results found for: BNP  No results for input(s): PROCALCITON in the last 168 hours.  Lab Results  Component Value Date   SARSCOV2NAA POSITIVE (A) 08/31/2020     GI prophylaxis:   ABG: No results found  for: PHART, PCO2ART, PO2ART, HCO3, TCO2, ACIDBASEDEF, O2SAT  Vent Settings: N/A   Condition - Stable  Family Communication  :Son Amy Andrade [phone]) updated over the phone 12/16  Code Status :  Full Code  Diet :  Diet Order            Diet Heart Room service appropriate? Yes; Fluid consistency: Thin  Diet effective now                  Disposition Plan  :   Status is: Inpatient  Remains inpatient appropriate because:Inpatient level of care appropriate due to severity of illness  Dispo: The patient is from: Home              Anticipated d/c is to: SNF              Anticipated d/c date is: > 3 days              Patient currently is not medically stable to d/c.   Barriers to discharge: Left femur fracture-await orthopedic evaluation  Antimicorbials  :    Anti-infectives (From admission, onward)   None      Inpatient Medications  Scheduled Meds: . amLODipine  10 mg Oral Daily  . aspirin EC  81 mg Oral Daily  . irbesartan  300 mg Oral Daily  . mometasone-formoterol  2 puff Inhalation BID  . pravastatin  40 mg Oral Daily   Continuous Infusions: . sodium chloride    . famotidine (PEPCID) IV     PRN Meds:.sodium chloride, albuterol, albuterol, diphenhydrAMINE, EPINEPHrine, famotidine (PEPCID) IV, HYDROcodone-acetaminophen,  methylPREDNISolone (SOLU-MEDROL) injection, polyethylene glycol   Time Spent in minutes  25  See all Orders from today for further details   Jeoffrey Massed M.D on 09/01/2020 at 10:37 AM  To page go to [email or web address] - use universal password  [facility] -  Office  [phone]    Objective:   Vitals:   08/31/20 2143 09/01/20 0107 09/01/20 0414 09/01/20 0851  BP: (!) 153/69 132/60 (!) 155/72 127/63  Pulse: 81 72 74   Resp: Temp: 98.8 F (37.1 C) 98.1 F (36.7 C) 98.8 F (37.1 C)   TempSrc: Oral  Oral   SpO2: 100% 96% 95%   Weight:      Height:        Wt Readings from Last 3 Encounters:  08/31/20 86.2 kg  04/25/20 85.5 kg  04/28/19 81.8 kg     Intake/Output Summary (Last 24 hours) at 09/01/2020 1037 Last data filed at 09/01/2020 0100 Gross per 24 hour  Intake 240 ml  Output --  Net 240 ml     Physical Exam Gen Exam:Alert awake-not in any distress HEENT:atraumatic, normocephalic Chest: B/L clear to auscultation anteriorly CVS:S1S2 regular Abdomen:soft non tender, non distended Extremities:no edema Neurology: Non focal Skin: no rash   Data Review:    CBC Recent Labs  Lab 08/31/20 1412 09/01/20 0638  WBC 4.3 3.2*  HGB 13.8 12.6  HCT 39.6 37.0  PLT 209 180  MCV 95.2 95.4  MCH 33.2 32.5  MCHC 34.8 34.1  RDW 12.4 12.5  LYMPHSABS 0.5*  --   MONOABS 0.5  --   EOSABS 0.0  --   BASOSABS 0.0  --     Chemistries  Recent Labs  Lab 08/31/20 1412 09/01/20 0638  NA 129* 129*  K 4.1 3.9  CL 94* 96*  CO2 23 22  GLUCOSE 108* 104*  BUN 11 13  CREATININE 0.82 0.81  CALCIUM 9.2 8.9  AST 44*  --   ALT 23  --   ALKPHOS 64  --   BILITOT 1.0  --    ------------------------------------------------------------------------------------------------------------------ No results for input(s): CHOL, HDL, LDLCALC, TRIG, CHOLHDL, LDLDIRECT in the last 72 hours.  Lab Results  Component Value Date   HGBA1C 6.4 (H) 04/19/2015    ------------------------------------------------------------------------------------------------------------------ No results for input(s): TSH, T4TOTAL, T3FREE, THYROIDAB in the last 72 hours.  Invalid input(s): FREET3 ------------------------------------------------------------------------------------------------------------------ No results for input(s): VITAMINB12, FOLATE, FERRITIN, TIBC, IRON, RETICCTPCT in the last 72 hours.  Coagulation profile Recent Labs  Lab 08/31/20 1908  INR 1.0    No results for input(s): DDIMER in the last 72 hours.  Cardiac Enzymes No results for input(s): CKMB, TROPONINI, MYOGLOBIN in the last 168 hours.  Invalid input(s): CK ------------------------------------------------------------------------------------------------------------------ No results found for: BNP  Micro Results Recent Results (from the past 240 hour(s))  Resp Panel by RT-PCR (Flu A&B, Covid) Nasopharyngeal Swab     Status: Abnormal   Collection Time: 08/31/20 12:55 PM   Specimen: Nasopharyngeal Swab; Nasopharyngeal(NP) swabs in vial transport medium  Result Value Ref Range Status   SARS Coronavirus 2 by RT PCR POSITIVE (A) NEGATIVE Final    Comment: emailed L. Berdik RN 15:15 08/31/20 ([email or web address]) (NOTE) SARS-CoV-2 target nucleic acids are DETECTED.  The SARS-CoV-2 RNA is generally detectable in upper respiratory specimens during the acute phase of infection. Positive results are indicative of the presence of the identified virus, but do not rule out bacterial infection or co-infection with other pathogens not detected by the test. Clinical correlation with patient history and other diagnostic information is necessary to determine patient infection status. The expected result is Negative.  Fact Sheet for Patients: BloggerCourse.com  Fact Sheet for Healthcare Providers: SeriousBroker.it  This test is not yet approved or  cleared by the [place] FDA and  has been authorized for detection and/or diagnosis of SARS-CoV-2 by FDA under an Emergency Use Authorization (EUA).  This EUA will remain in effect (meaning this test can be used) for the duration of  the COVID- 19 declaration under Section 564(b)(1) of the Act, 21 U.S.C. section 360bbb-3(b)(1), unless the authorization is terminated or revoked sooner.     Influenza A by PCR NEGATIVE NEGATIVE Final   Influenza B by PCR NEGATIVE NEGATIVE Final    Comment: (NOTE) The Xpert Xpress SARS-CoV-2/FLU/RSV plus assay is intended as an aid in the diagnosis of influenza from Nasopharyngeal swab specimens and should not be used as a sole basis for treatment. Nasal washings and aspirates are unacceptable for Xpert Xpress SARS-CoV-2/FLU/RSV testing.  Fact Sheet for Patients: BloggerCourse.com  Fact Sheet for Healthcare Providers: SeriousBroker.it  This test is not yet approved or cleared by the [place] FDA and has been authorized for detection and/or diagnosis of SARS-CoV-2 by FDA under an Emergency Use Authorization (EUA). This EUA will remain in effect (meaning this test can be used) for the duration of the COVID-19 declaration under Section 564(b)(1) of the Act, 21 U.S.C. section 360bbb-3(b)(1), unless the authorization is terminated or revoked.  Performed at [facility], [address], [place], [place] [address]     Radiology Reports DG Wrist Complete Left  Result Date: 08/31/2020 CLINICAL DATA:  Unwitnessed fall, pain. EXAM: LEFT WRIST - COMPLETE 3+ VIEW COMPARISON:  None. FINDINGS: No acute fracture. There is mild widening of the scapholunate interval of unknown acuity. Advanced osteoarthritis at the thumb carpal metacarpal joint. Mild radiocarpal osteoarthritis.  Osteoarthritis of the metacarpal phalangeal joints most prominently involving the middle finger. There is chondrocalcinosis  of the triangular fibrocartilage. There is no focal soft tissue abnormality. IMPRESSION: 1. No acute fracture of the left wrist. 2. Widening of the scapholunate interval of unknown acuity. 3. Multifocal osteoarthritis. Chondrocalcinosis of the triangular fibrocartilage. Electronically Signed   By: Narda Rutherford M.D.   On: 08/31/2020 17:48   DG Knee 2 Views Left  Result Date: 08/31/2020 CLINICAL DATA:  Recent fall with leg pain, initial encounter EXAM: LEFT KNEE - 1-2 VIEW COMPARISON:  None. FINDINGS: Left knee prosthesis is noted in satisfactory position. There is a fracture in the medial aspect of the distal femoral metaphysis with mild displacement identified. Significant joint effusion is seen. No other focal abnormality is noted. IMPRESSION: Undisplaced distal left femoral fracture. Electronically Signed   By: Alcide Clever M.D.   On: 08/31/2020 13:15   CT Head Wo Contrast  Result Date: 08/31/2020 CLINICAL DATA:  Fall, head trauma. EXAM: CT HEAD WITHOUT CONTRAST TECHNIQUE: Contiguous axial images were obtained from the base of the skull through the vertex without intravenous contrast. COMPARISON:  None. FINDINGS: Brain: No hemorrhage. No extra-axial or subdural collection. There is equivocal loss of gray-white differentiation in the right occipital lobe versus artifact. Generalized atrophy moderate to advanced periventricular and deep white matter chronic small vessel ischemia. Bilateral basal gangliar mineralization is typically senescent. No hydrocephalus. Vascular: Atherosclerosis of skullbase vasculature without hyperdense vessel or abnormal calcification. Skull: No fracture or focal lesion. Sinuses/Orbits: Paranasal sinuses and mastoid air cells are clear. The visualized orbits are unremarkable. Frontal sinuses are hypo pneumatized. Other: None. IMPRESSION: 1. Equivocal loss of gray-white differentiation in the right occipital lobe versus artifact. Recommend correlation with neurologic symptoms.  If there is clinical concern for acute infarct, recommend MRI for further evaluation. 2. No hemorrhage. 3. Generalized atrophy and chronic small vessel ischemia. Electronically Signed   By: Narda Rutherford M.D.   On: 08/31/2020 18:00   DG Chest Port 1 View  Result Date: 08/31/2020 CLINICAL DATA:  84 year old female with history of COVID-19. EXAM: PORTABLE CHEST 1 VIEW COMPARISON:  Chest x-ray 05/30/2016. FINDINGS: Lung volumes are normal. No consolidative airspace disease. No pleural effusions. No pneumothorax. No pulmonary nodule or mass noted. Pulmonary vasculature and the cardiomediastinal silhouette are within normal limits. IMPRESSION: No radiographic evidence of acute cardiopulmonary disease. Electronically Signed   By: Trudie Reed M.D.   On: 08/31/2020 17:40   DG Shoulder Left  Result Date: 08/31/2020 CLINICAL DATA:  Pain after unwitnessed fall. EXAM: LEFT SHOULDER - 2+ VIEW COMPARISON:  None. FINDINGS: Portable AP and oblique images of the shoulder obtained. Limited assessment of glenohumeral alignment. Humeral head appears centered on the glenoid on the oblique view, however appears slightly anteriorly subluxed on AP view. No acute fracture. Degenerative change of the humeral head and acromioclavicular joint. IMPRESSION: Limited assessment of glenohumeral alignment. This appears slightly anterior subluxed on the AP view, but humeral head centered in the glenoid on oblique. Recommend axillary view if patient is able. No acute fracture. Electronically Signed   By: Narda Rutherford M.D.   On: 08/31/2020 17:47   DG Hip Unilat W or Wo Pelvis 1 View Left  Result Date: 08/31/2020 CLINICAL DATA:  Unwitnessed fall last night. Left hip pain. EXAM: DG HIP (WITH OR WITHOUT PELVIS) 1V*L* COMPARISON:  None. FINDINGS: AP view of the pelvis with AP view of the left hip. The cortical margins of the bony pelvis and left hip are  intact. No fracture. Pubic symphysis and sacroiliac joints are congruent.  Mild bilateral hip osteoarthritis. Both femoral heads are well-seated in the respective acetabula. IMPRESSION: No pelvic or left hip fracture. Electronically Signed   By: Narda RutherfordMelanie  Sanford M.D.   On: 08/31/2020 17:45

## 2020-09-01 NOTE — Consult Note (Signed)
Reason for Consult:Left distal femur fx Referring Physician: Windell Norfolk Time called: [id number] Time at bedside: 1020   Amy Andrade is an 84 y.o. female.  HPI: Amy Andrade fell at home 3 nights ago. The fall was unwitnessed and she spent some time on the ground. Family found her and helped her get up and she was able to be helped up and could ambulate. However, the pain in her knee continued to increase and she was brought to the ED for evaluation. X-rays showed a periprosthetic distal femur fx and orthopedic surgery was consulted. She c/o localized pain in the knee. She has some dementia and is unable to contribute much to history, most of which was gleaned from the chart.  Past Medical History:  Diagnosis Date  . Alzheimer disease (HCC)   . Asthma   . Hyperlipidemia   . Hypertension   . Pulmonary embolism (HCC) 2004   was short of breath--was on coumadin for 6 mos to a year    Past Surgical History:  Procedure Laterality Date  . ABDOMINAL HYSTERECTOMY    . BUNIONECTOMY    . REPLACEMENT TOTAL KNEE  1980s, 2003   x2    Family History  Problem Relation Age of Onset  . Alzheimer's disease Mother   . Chronic bronchitis Mother   . Heart disease Father   . High blood pressure Father   . High blood pressure Brother   . Asthma Daughter   . High blood pressure Son   . High Cholesterol Son   . Post-traumatic stress disorder Son     Social History:  reports that she quit smoking about 63 years ago. Her smoking use included cigarettes. She has a 1.00 pack-year smoking history. She has never used smokeless tobacco. She reports current alcohol use of about 42.0 standard drinks of alcohol per week. She reports that she does not use drugs.  Allergies: No Known Allergies  Medications: I have reviewed the patient's current medications.  Results for orders placed or performed during the hospital encounter of 08/31/20 (from the past 48 hour(s))  Resp Panel by RT-PCR (Flu A&B, Covid)  Nasopharyngeal Swab     Status: Abnormal   Collection Time: 08/31/20 12:55 PM   Specimen: Nasopharyngeal Swab; Nasopharyngeal(NP) swabs in vial transport medium  Result Value Ref Range   SARS Coronavirus 2 by RT PCR POSITIVE (A) NEGATIVE    Comment: emailed L. Berdik RN 15:15 08/31/20 ([email or web address]) (NOTE) SARS-CoV-2 target nucleic acids are DETECTED.  The SARS-CoV-2 RNA is generally detectable in upper respiratory specimens during the acute phase of infection. Positive results are indicative of the presence of the identified virus, but do not rule out bacterial infection or co-infection with other pathogens not detected by the test. Clinical correlation with patient history and other diagnostic information is necessary to determine patient infection status. The expected result is Negative.  Fact Sheet for Patients: BloggerCourse.com  Fact Sheet for Healthcare Providers: SeriousBroker.it  This test is not yet approved or cleared by the [place] FDA and  has been authorized for detection and/or diagnosis of SARS-CoV-2 by FDA under an Emergency Use Authorization (EUA).  This EUA will remain in effect (meaning this test can be used) for the duration of  the COVID- 19 declaration under Section 564(b)(1) of the Act, 21 U.S.C. section 360bbb-3(b)(1), unless the authorization is terminated or revoked sooner.     Influenza A by PCR NEGATIVE NEGATIVE   Influenza B by PCR NEGATIVE NEGATIVE    Comment: (NOTE)  The Xpert Xpress SARS-CoV-2/FLU/RSV plus assay is intended as an aid in the diagnosis of influenza from Nasopharyngeal swab specimens and should not be used as a sole basis for treatment. Nasal washings and aspirates are unacceptable for Xpert Xpress SARS-CoV-2/FLU/RSV testing.  Fact Sheet for Patients: BloggerCourse.comhttps://www.fda.gov/media/152166/download  Fact Sheet for Healthcare  Providers: SeriousBroker.ithttps://www.fda.gov/media/152162/download  This test is not yet approved or cleared by the [place] FDA and has been authorized for detection and/or diagnosis of SARS-CoV-2 by FDA under an Emergency Use Authorization (EUA). This EUA will remain in effect (meaning this test can be used) for the duration of the COVID-19 declaration under Section 564(b)(1) of the Act, 21 U.S.C. section 360bbb-3(b)(1), unless the authorization is terminated or revoked.  Performed at [facility], [address] [place], [place] [address]   CBC with Differential     Status: Abnormal   Collection Time: 08/31/20  2:12 PM  Result Value Ref Range   WBC 4.3 4.0 - 10.5 K/uL   RBC 4.16 3.87 - 5.11 MIL/uL   Hemoglobin 13.8 12.0 - 15.0 g/dL   HCT 19.139.6 47.836.0 - 29.546.0 %   MCV 95.2 80.0 - 100.0 fL   MCH 33.2 26.0 - 34.0 pg   MCHC 34.8 30.0 - 36.0 g/dL   RDW 62.112.4 30.811.5 - 65.715.5 %   Platelets 209 150 - 400 K/uL   nRBC 0.0 0.0 - 0.2 %   Neutrophils Relative % 76 %   Neutro Abs 3.2 1.7 - 7.7 K/uL   Lymphocytes Relative 12 %   Lymphs Abs 0.5 (L) 0.7 - 4.0 K/uL   Monocytes Relative 12 %   Monocytes Absolute 0.5 0.1 - 1.0 K/uL   Eosinophils Relative 0 %   Eosinophils Absolute 0.0 0.0 - 0.5 K/uL   Basophils Relative 0 %   Basophils Absolute 0.0 0.0 - 0.1 K/uL   Immature Granulocytes 0 %   Abs Immature Granulocytes 0.01 0.00 - 0.07 K/uL    Comment: Performed at [facility], [address] [place], [place] [address]  Comprehensive metabolic panel     Status: Abnormal   Collection Time: 08/31/20  2:12 PM  Result Value Ref Range   Sodium 129 (L) 135 - 145 mmol/L   Potassium 4.1 3.5 - 5.1 mmol/L   Chloride 94 (L) 98 - 111 mmol/L   CO2 23 22 - 32 mmol/L   Glucose, Bld 108 (H) 70 - 99 mg/dL    Comment: Glucose reference range applies only to samples taken after fasting for at least 8 hours.   BUN 11 8 - 23 mg/dL   Creatinine, Ser 2.950.82 0.44 - 1.00 mg/dL   Calcium 9.2 8.9 - 28.410.3 mg/dL    Total Protein 6.9 6.5 - 8.1 g/dL   Albumin 3.8 3.5 - 5.0 g/dL   AST 44 (H) 15 - 41 U/L   ALT 23 0 - 44 U/L   Alkaline Phosphatase 64 38 - 126 U/L   Total Bilirubin 1.0 0.3 - 1.2 mg/dL   GFR, Estimated >13>60 >24>60 mL/min    Comment: (NOTE) Calculated using the CKD-EPI Creatinine Equation (2021)    Anion gap 12 5 - 15    Comment: Performed at [facility], [address] [place], [place] [address]  ABO/Rh     Status: None   Collection Time: 08/31/20  2:12 PM  Result Value Ref Range   ABO/RH(D)      AB POS Performed at [facility], [address] [place], [place] [address]   Protime-INR     Status: None   Collection Time: 08/31/20  7:08 PM  Result Value Ref Range   Prothrombin Time 12.6 11.4 - 15.2 seconds   INR 1.0 0.8 - 1.2    Comment: (NOTE) INR goal varies based on device and disease states. Performed at [facility], [address], [place], [place] [address]   Type and screen     Status: None   Collection Time: 08/31/20  9:18 PM  Result Value Ref Range   ABO/RH(D) AB POS    Antibody Screen NEG    Sample Expiration      09/03/2020,2359 Performed at [facility], [address], [place], [place] [address]   CBC     Status: Abnormal   Collection Time: 09/01/20  6:38 AM  Result Value Ref Range   WBC 3.2 (L) 4.0 - 10.5 K/uL   RBC 3.88 3.87 - 5.11 MIL/uL   Hemoglobin 12.6 12.0 - 15.0 g/dL   HCT 91.4 78.2 - 95.6 %   MCV 95.4 80.0 - 100.0 fL   MCH 32.5 26.0 - 34.0 pg   MCHC 34.1 30.0 - 36.0 g/dL   RDW 21.3 08.6 - 57.8 %   Platelets 180 150 - 400 K/uL   nRBC 0.0 0.0 - 0.2 %    Comment: Performed at [facility], [address], [place], [place] [address]  Basic metabolic panel     Status: Abnormal   Collection Time: 09/01/20  6:38 AM  Result Value Ref Range   Sodium 129 (L) 135 - 145 mmol/L   Potassium 3.9 3.5 - 5.1 mmol/L   Chloride 96 (L) 98 - 111 mmol/L   CO2 22 22 - 32 mmol/L   Glucose, Bld 104 (H) 70 - 99 mg/dL    Comment:  Glucose reference range applies only to samples taken after fasting for at least 8 hours.   BUN 13 8 - 23 mg/dL   Creatinine, Ser 9.52 0.44 - 1.00 mg/dL   Calcium 8.9 8.9 - 84.1 mg/dL   GFR, Estimated >32 >44 mL/min    Comment: (NOTE) Calculated using the CKD-EPI Creatinine Equation (2021)    Anion gap 11 5 - 15    Comment: Performed at [facility], [address], [place], [place] [address]    DG Wrist Complete Left  Result Date: 08/31/2020 CLINICAL DATA:  Unwitnessed fall, pain. EXAM: LEFT WRIST - COMPLETE 3+ VIEW COMPARISON:  None. FINDINGS: No acute fracture. There is mild widening of the scapholunate interval of unknown acuity. Advanced osteoarthritis at the thumb carpal metacarpal joint. Mild radiocarpal osteoarthritis. Osteoarthritis of the metacarpal phalangeal joints most prominently involving the middle finger. There is chondrocalcinosis of the triangular fibrocartilage. There is no focal soft tissue abnormality. IMPRESSION: 1. No acute fracture of the left wrist. 2. Widening of the scapholunate interval of unknown acuity. 3. Multifocal osteoarthritis. Chondrocalcinosis of the triangular fibrocartilage. Electronically Signed   By: Narda Rutherford M.D.   On: 08/31/2020 17:48   DG Knee 2 Views Left  Result Date: 08/31/2020 CLINICAL DATA:  Recent fall with leg pain, initial encounter EXAM: LEFT KNEE - 1-2 VIEW COMPARISON:  None. FINDINGS: Left knee prosthesis is noted in satisfactory position. There is a fracture in the medial aspect of the distal femoral metaphysis with mild displacement identified. Significant joint effusion is seen. No other focal abnormality is noted. IMPRESSION: Undisplaced distal left femoral fracture. Electronically Signed  By: Alcide Clever M.D.   On: 08/31/2020 13:15   CT Head Wo Contrast  Result Date: 08/31/2020 CLINICAL DATA:  Fall, head trauma. EXAM: CT HEAD WITHOUT CONTRAST TECHNIQUE: Contiguous axial images were obtained from the base of the  skull through the vertex without intravenous contrast. COMPARISON:  None. FINDINGS: Brain: No hemorrhage. No extra-axial or subdural collection. There is equivocal loss of gray-white differentiation in the right occipital lobe versus artifact. Generalized atrophy moderate to advanced periventricular and deep white matter chronic small vessel ischemia. Bilateral basal gangliar mineralization is typically senescent. No hydrocephalus. Vascular: Atherosclerosis of skullbase vasculature without hyperdense vessel or abnormal calcification. Skull: No fracture or focal lesion. Sinuses/Orbits: Paranasal sinuses and mastoid air cells are clear. The visualized orbits are unremarkable. Frontal sinuses are hypo pneumatized. Other: None. IMPRESSION: 1. Equivocal loss of gray-white differentiation in the right occipital lobe versus artifact. Recommend correlation with neurologic symptoms. If there is clinical concern for acute infarct, recommend MRI for further evaluation. 2. No hemorrhage. 3. Generalized atrophy and chronic small vessel ischemia. Electronically Signed   By: Narda Rutherford M.D.   On: 08/31/2020 18:00   DG Chest Port 1 View  Result Date: 08/31/2020 CLINICAL DATA:  84 year old female with history of COVID-19. EXAM: PORTABLE CHEST 1 VIEW COMPARISON:  Chest x-ray 05/30/2016. FINDINGS: Lung volumes are normal. No consolidative airspace disease. No pleural effusions. No pneumothorax. No pulmonary nodule or mass noted. Pulmonary vasculature and the cardiomediastinal silhouette are within normal limits. IMPRESSION: No radiographic evidence of acute cardiopulmonary disease. Electronically Signed   By: Trudie Reed M.D.   On: 08/31/2020 17:40   DG Shoulder Left  Result Date: 08/31/2020 CLINICAL DATA:  Pain after unwitnessed fall. EXAM: LEFT SHOULDER - 2+ VIEW COMPARISON:  None. FINDINGS: Portable AP and oblique images of the shoulder obtained. Limited assessment of glenohumeral alignment. Humeral head  appears centered on the glenoid on the oblique view, however appears slightly anteriorly subluxed on AP view. No acute fracture. Degenerative change of the humeral head and acromioclavicular joint. IMPRESSION: Limited assessment of glenohumeral alignment. This appears slightly anterior subluxed on the AP view, but humeral head centered in the glenoid on oblique. Recommend axillary view if patient is able. No acute fracture. Electronically Signed   By: Narda Rutherford M.D.   On: 08/31/2020 17:47   DG Hip Unilat W or Wo Pelvis 1 View Left  Result Date: 08/31/2020 CLINICAL DATA:  Unwitnessed fall last night. Left hip pain. EXAM: DG HIP (WITH OR WITHOUT PELVIS) 1V*L* COMPARISON:  None. FINDINGS: AP view of the pelvis with AP view of the left hip. The cortical margins of the bony pelvis and left hip are intact. No fracture. Pubic symphysis and sacroiliac joints are congruent. Mild bilateral hip osteoarthritis. Both femoral heads are well-seated in the respective acetabula. IMPRESSION: No pelvic or left hip fracture. Electronically Signed   By: Narda Rutherford M.D.   On: 08/31/2020 17:45    Review of Systems  HENT: Negative for ear discharge, ear pain, hearing loss and tinnitus.   Eyes: Negative for photophobia and pain.  Respiratory: Negative for cough and shortness of breath.   Cardiovascular: Negative for chest pain.  Gastrointestinal: Negative for abdominal pain, nausea and vomiting.  Genitourinary: Negative for dysuria, flank pain, frequency and urgency.  Musculoskeletal: Positive for arthralgias (Left knee). Negative for back pain, myalgias and neck pain.  Neurological: Negative for dizziness and headaches.  Hematological: Does not bruise/bleed easily.  Psychiatric/Behavioral: The patient is not nervous/anxious.  Blood pressure 127/63, pulse 74, temperature 98.8 F (37.1 C), temperature source Oral, resp. rate 18, height 5\' 8"  (1.727 m), weight 86.2 kg, SpO2 95 %. Physical  Exam Constitutional:      General: She is not in acute distress.    Appearance: She is well-developed and well-nourished. She is not diaphoretic.  HENT:     Head: Normocephalic and atraumatic.  Eyes:     General: No scleral icterus.       Right eye: No discharge.        Left eye: No discharge.     Conjunctiva/sclera: Conjunctivae normal.  Cardiovascular:     Rate and Rhythm: Normal rate and regular rhythm.  Pulmonary:     Effort: Pulmonary effort is normal. No respiratory distress.  Musculoskeletal:     Cervical back: Normal range of motion.     Comments: LLE No traumatic wounds, ecchymosis, or rash  Mod TTP knee  No ankle effusion  Sens DPN, SPN, TN intact  Motor EHL, ext, flex, evers 5/5  DP 2+, PT 1+, No significant edema  Skin:    General: Skin is warm and dry.  Neurological:     Mental Status: She is alert.  Psychiatric:        Mood and Affect: Mood and affect normal.        Behavior: Behavior normal.     Assessment/Plan: Left distal femur fx -- Should be able to avoid surgery with a KI and TDWB. F/u with Dr. in 3 weeks. Multiple medical problems including Alzheimer dementia, asthma, chronic pain, hyperlipidemia, hypertension, and chronic hyponatremia -- per primary service    Sherlean Foot, PA-C Orthopedic Surgery [phone] 09/01/2020, 10:52 AM

## 2020-09-02 LAB — TSH: TSH: 1.46 u[IU]/mL (ref 0.350–4.500)

## 2020-09-02 LAB — CBC
HCT: 35.4 % — ABNORMAL LOW (ref 36.0–46.0)
Hemoglobin: 12.9 g/dL (ref 12.0–15.0)
MCH: 33.7 pg (ref 26.0–34.0)
MCHC: 36.4 g/dL — ABNORMAL HIGH (ref 30.0–36.0)
MCV: 92.4 fL (ref 80.0–100.0)
Platelets: 179 10*3/uL (ref 150–400)
RBC: 3.83 MIL/uL — ABNORMAL LOW (ref 3.87–5.11)
RDW: 12.1 % (ref 11.5–15.5)
WBC: 3.4 10*3/uL — ABNORMAL LOW (ref 4.0–10.5)
nRBC: 0 % (ref 0.0–0.2)

## 2020-09-02 LAB — BASIC METABOLIC PANEL
Anion gap: 13 (ref 5–15)
BUN: 9 mg/dL (ref 8–23)
CO2: 21 mmol/L — ABNORMAL LOW (ref 22–32)
Calcium: 8.6 mg/dL — ABNORMAL LOW (ref 8.9–10.3)
Chloride: 93 mmol/L — ABNORMAL LOW (ref 98–111)
Creatinine, Ser: 0.77 mg/dL (ref 0.44–1.00)
GFR, Estimated: >60 mL/min (ref >60)
Glucose, Bld: 105 mg/dL — ABNORMAL HIGH (ref 70–99)
Potassium: 3.9 mmol/L (ref 3.5–5.1)
Sodium: 127 mmol/L — ABNORMAL LOW (ref 135–145)

## 2020-09-02 LAB — C-REACTIVE PROTEIN: CRP: 3.9 mg/dL — ABNORMAL HIGH (ref <1.0)

## 2020-09-02 LAB — OSMOLALITY: Osmolality: 273 mOsm/kg — ABNORMAL LOW (ref 275–295)

## 2020-09-02 LAB — D-DIMER, QUANTITATIVE: D-Dimer, Quant: 2.08 ug/mL-FEU — ABNORMAL HIGH (ref 0.00–0.50)

## 2020-09-02 MED ORDER — LORAZEPAM 0.5 MG PO TABS
0.5000 mg | ORAL_TABLET | Freq: Two times a day (BID) | ORAL | Status: AC | PRN
  Administered 2020-09-02 (×2): 0.5 mg via ORAL
  Filled 2020-09-02 (×2): qty 1

## 2020-09-02 MED ORDER — WHITE PETROLATUM EX OINT
TOPICAL_OINTMENT | CUTANEOUS | Status: AC
  Filled 2020-09-02: qty 28.35

## 2020-09-02 MED ORDER — SODIUM CHLORIDE 0.9 % IV SOLN: INTRAVENOUS | Status: AC

## 2020-09-02 NOTE — Progress Notes (Signed)
PROGRESS NOTE                                                                                                                                                                                                             Patient Demographics:    Amy Andrade, is a 84 y.o. female, DOB - 1935-11-21, HAL:[id number]  Outpatient Primary MD for the patient is Kermit Balo, DO   Admit date - 08/31/2020   LOS - 1  Chief Complaint  Patient presents with  . Fall       Brief Narrative: Patient is a 84 y.o. female with PMHx of dementia, HTN, asthma, chronic pain, chronic hyponatremia-presented to the hospital following episode of fall-found to have left femur fracture and incidental COVID-19 infection.  COVID-19 vaccinated status: Unvaccinated  Significant Events: 12/15>> Admit to [facility] for left femur fracture, incidental COVID-19 infection  Significant studies: 12/15>> x-ray hip left: No pelvic/hip fracture 12/15>> x-ray left knee: Undisplaced distal left femoral fracture 12/15>> x-ray left shoulder: No acute fracture 12/15>> x-ray left wrist: No acute fracture 12/15>> CT head: No acute abnormalities 12/15>>Chest x-ray: No pneumonia  COVID-19 medications: 12/15>> monoclonal antibody x1  Antibiotics: None  Microbiology data: None  Procedures: None  Consults: Orthopedics  DVT prophylaxis: enoxaparin (LOVENOX) injection 40 mg Start: 09/01/20 1145 Place and maintain sequential compression device Start: 08/31/20 1917 SQ Lovenox    Subjective:   Remains pleasantly confused but able to follow most of my commands.   Assessment  & Plan :   Left distal femur fracture: Following a fall-appreciate orthopedics evaluation-nonoperative management recommended.  Remain in knee immobilizer and TTWB.  Follow-up with Dr. Sherlean Foot in 3 weeks.  Plans are for SNF on discharge-social work following.  Marland Kitchen  HTN: Stable-continue  amlodipine/irbesartan.  HLD: Continue statin  Asthma: Stable-not wheezing-continue Dulera, and as needed albuterol.  Chronic left shoulder/low back pain: Stable-supportive care.  Hyponatremia: Appears to be chronic-but per nursing report-poor oral intake since admission-start gentle hydration with IV fluids overnight to see if improvement.  If no improvement with IV fluids-May need Lasix/Samsca.  Check  serum osmolality/urine osmolality/TSH/random urine sodium.   History of PE/VTE: No longer on anticoagulation per patient report.  Start prophylactic Lovenox  COVID-19 infection: Asymptomatic-s/p monoclonal antibody infusion on 12/15. Son has COVID as well.  Per patient's family-treating patient had some symptoms  of cough/weakness around 12/8-12/9  Fever: afebrile O2 requirements:  SpO2: 95 %   COVID-19 Labs: Recent Labs    09/01/20 1101 09/02/20 0212 09/02/20 0746  DDIMER  --   --  2.08*  CRP 4.4* 3.9*  --     No results found for: BNP  No results for input(s): PROCALCITON in the last 168 hours.  Lab Results  Component Value Date   SARSCOV2NAA POSITIVE (A) 08/31/2020     GI prophylaxis:   ABG: No results found for: PHART, PCO2ART, PO2ART, HCO3, TCO2, ACIDBASEDEF, O2SAT  Vent Settings: N/A   Condition - Stable  Family Communication  :Son Clovis Riley [phone]) updated over the phone 12/17  Code Status :  Full Code  Diet :  Diet Order            Diet Heart Room service appropriate? Yes; Fluid consistency: Thin  Diet effective now                  Disposition Plan  :   Status is: Inpatient  Remains inpatient appropriate because:Inpatient level of care appropriate due to severity of illness  Dispo: The patient is from: Home              Anticipated d/c is to: SNF              Anticipated d/c date is: > 3 days              Patient currently is not medically stable to d/c.   Barriers to discharge: Left femur fracture-hyponatremia-  Antimicorbials  :     Anti-infectives (From admission, onward)   None      Inpatient Medications  Scheduled Meds: . amLODipine  10 mg Oral Daily  . aspirin EC  81 mg Oral Daily  . enoxaparin (LOVENOX) injection  40 mg Subcutaneous Q24H  . famotidine  20 mg Oral Daily  . irbesartan  300 mg Oral Daily  . mometasone-formoterol  2 puff Inhalation BID  . pravastatin  40 mg Oral Daily   Continuous Infusions: . sodium chloride    . sodium chloride 60 mL/hr at 09/02/20 0906  . famotidine (PEPCID) IV     PRN Meds:.sodium chloride, albuterol, albuterol, diphenhydrAMINE, EPINEPHrine, famotidine (PEPCID) IV, HYDROcodone-acetaminophen, methylPREDNISolone (SOLU-MEDROL) injection, morphine injection, polyethylene glycol   Time Spent in minutes  25  See all Orders from today for further details   Jeoffrey Massed M.D on 09/02/2020 at 2:13 PM  To page go to [email or web address] - use universal password  [facility] -  Office  [phone]    Objective:   Vitals:   09/01/20 0851 09/01/20 1507 09/01/20 2009 09/02/20 0409  BP: 127/63 (!) 112/57 (!) 156/90 140/66  Pulse:  87 73 79  Resp:  20 20 18   Temp:  98.7 F (37.1 C) 99.8 F (37.7 C) 100 F (37.8 C)  TempSrc:  Oral  Oral  SpO2:  98% 96% 95%  Weight:      Height:        Wt Readings from Last 3 Encounters:  08/31/20 86.2 kg  04/25/20 85.5 kg  04/28/19 81.8 kg     Intake/Output Summary (Last 24 hours) at 09/02/2020 1413 Last data filed at 09/02/2020 09/04/2020 Gross per 24 hour  Intake 490 ml  Output 950 ml  Net -460 ml     Physical Exam Gen Exam:Alert awake-not in any distress HEENT:atraumatic, normocephalic Chest: B/L clear to auscultation anteriorly CVS:S1S2 regular Abdomen:soft non tender, non distended Extremities:no  edema Neurology: Non focal Skin: no rash   Data Review:    CBC Recent Labs  Lab 08/31/20 1412 09/01/20 0638 09/02/20 0212  WBC 4.3 3.2* 3.4*  HGB 13.8 12.6 12.9  HCT 39.6 37.0 35.4*  PLT 209 180 179   MCV 95.2 95.4 92.4  MCH 33.2 32.5 33.7  MCHC 34.8 34.1 36.4*  RDW 12.4 12.5 12.1  LYMPHSABS 0.5*  --   --   MONOABS 0.5  --   --   EOSABS 0.0  --   --   BASOSABS 0.0  --   --     Chemistries  Recent Labs  Lab 08/31/20 1412 09/01/20 0638 09/02/20 0212  NA 129* 129* 127*  K 4.1 3.9 3.9  CL 94* 96* 93*  CO2 23 22 21*  GLUCOSE 108* 104* 105*  BUN 11 13 9   CREATININE 0.82 0.81 0.77  CALCIUM 9.2 8.9 8.6*  AST 44*  --   --   ALT 23  --   --   ALKPHOS 64  --   --   BILITOT 1.0  --   --    ------------------------------------------------------------------------------------------------------------------ No results for input(s): CHOL, HDL, LDLCALC, TRIG, CHOLHDL, LDLDIRECT in the last 72 hours.  Lab Results  Component Value Date   HGBA1C 6.4 (H) 04/19/2015   ------------------------------------------------------------------------------------------------------------------ Recent Labs    09/02/20 0746  TSH 1.460   ------------------------------------------------------------------------------------------------------------------ No results for input(s): VITAMINB12, FOLATE, FERRITIN, TIBC, IRON, RETICCTPCT in the last 72 hours.  Coagulation profile Recent Labs  Lab 08/31/20 1908  INR 1.0    Recent Labs    09/02/20 0746  DDIMER 2.08*    Cardiac Enzymes No results for input(s): CKMB, TROPONINI, MYOGLOBIN in the last 168 hours.  Invalid input(s): CK ------------------------------------------------------------------------------------------------------------------ No results found for: BNP  Micro Results Recent Results (from the past 240 hour(s))  Resp Panel by RT-PCR (Flu A&B, Covid) Nasopharyngeal Swab     Status: Abnormal   Collection Time: 08/31/20 12:55 PM   Specimen: Nasopharyngeal Swab; Nasopharyngeal(NP) swabs in vial transport medium  Result Value Ref Range Status   SARS Coronavirus 2 by RT PCR POSITIVE (A) NEGATIVE Final    Comment: emailed L. Berdik RN  15:15 08/31/20 ([email or web address]) (NOTE) SARS-CoV-2 target nucleic acids are DETECTED.  The SARS-CoV-2 RNA is generally detectable in upper respiratory specimens during the acute phase of infection. Positive results are indicative of the presence of the identified virus, but do not rule out bacterial infection or co-infection with other pathogens not detected by the test. Clinical correlation with patient history and other diagnostic information is necessary to determine patient infection status. The expected result is Negative.  Fact Sheet for Patients: BloggerCourse.comhttps://www.fda.gov/media/152166/download  Fact Sheet for Healthcare Providers: SeriousBroker.ithttps://www.fda.gov/media/152162/download  This test is not yet approved or cleared by the [place] FDA and  has been authorized for detection and/or diagnosis of SARS-CoV-2 by FDA under an Emergency Use Authorization (EUA).  This EUA will remain in effect (meaning this test can be used) for the duration of  the COVID- 19 declaration under Section 564(b)(1) of the Act, 21 U.S.C. section 360bbb-3(b)(1), unless the authorization is terminated or revoked sooner.     Influenza A by PCR NEGATIVE NEGATIVE Final   Influenza B by PCR NEGATIVE NEGATIVE Final    Comment: (NOTE) The Xpert Xpress SARS-CoV-2/FLU/RSV plus assay is intended as an aid in the diagnosis of influenza from Nasopharyngeal swab specimens and should not be used as a sole basis for treatment. Nasal washings and aspirates  are unacceptable for Xpert Xpress SARS-CoV-2/FLU/RSV testing.  Fact Sheet for Patients: BloggerCourse.com  Fact Sheet for Healthcare Providers: SeriousBroker.it  This test is not yet approved or cleared by the [place] FDA and has been authorized for detection and/or diagnosis of SARS-CoV-2 by FDA under an Emergency Use Authorization (EUA). This EUA will remain in effect (meaning this test can be used) for the  duration of the COVID-19 declaration under Section 564(b)(1) of the Act, 21 U.S.C. section 360bbb-3(b)(1), unless the authorization is terminated or revoked.  Performed at [facility], [address] [place], [place] [address]     Radiology Reports DG Wrist Complete Left  Result Date: 08/31/2020 CLINICAL DATA:  Unwitnessed fall, pain. EXAM: LEFT WRIST - COMPLETE 3+ VIEW COMPARISON:  None. FINDINGS: No acute fracture. There is mild widening of the scapholunate interval of unknown acuity. Advanced osteoarthritis at the thumb carpal metacarpal joint. Mild radiocarpal osteoarthritis. Osteoarthritis of the metacarpal phalangeal joints most prominently involving the middle finger. There is chondrocalcinosis of the triangular fibrocartilage. There is no focal soft tissue abnormality. IMPRESSION: 1. No acute fracture of the left wrist. 2. Widening of the scapholunate interval of unknown acuity. 3. Multifocal osteoarthritis. Chondrocalcinosis of the triangular fibrocartilage. Electronically Signed   By: Narda Rutherford M.D.   On: 08/31/2020 17:48   DG Knee 2 Views Left  Result Date: 08/31/2020 CLINICAL DATA:  Recent fall with leg pain, initial encounter EXAM: LEFT KNEE - 1-2 VIEW COMPARISON:  None. FINDINGS: Left knee prosthesis is noted in satisfactory position. There is a fracture in the medial aspect of the distal femoral metaphysis with mild displacement identified. Significant joint effusion is seen. No other focal abnormality is noted. IMPRESSION: Undisplaced distal left femoral fracture. Electronically Signed   By: Alcide Clever M.D.   On: 08/31/2020 13:15   CT Head Wo Contrast  Result Date: 08/31/2020 CLINICAL DATA:  Fall, head trauma. EXAM: CT HEAD WITHOUT CONTRAST TECHNIQUE: Contiguous axial images were obtained from the base of the skull through the vertex without intravenous contrast. COMPARISON:  None. FINDINGS: Brain: No hemorrhage. No extra-axial or subdural collection. There is  equivocal loss of gray-white differentiation in the right occipital lobe versus artifact. Generalized atrophy moderate to advanced periventricular and deep white matter chronic small vessel ischemia. Bilateral basal gangliar mineralization is typically senescent. No hydrocephalus. Vascular: Atherosclerosis of skullbase vasculature without hyperdense vessel or abnormal calcification. Skull: No fracture or focal lesion. Sinuses/Orbits: Paranasal sinuses and mastoid air cells are clear. The visualized orbits are unremarkable. Frontal sinuses are hypo pneumatized. Other: None. IMPRESSION: 1. Equivocal loss of gray-white differentiation in the right occipital lobe versus artifact. Recommend correlation with neurologic symptoms. If there is clinical concern for acute infarct, recommend MRI for further evaluation. 2. No hemorrhage. 3. Generalized atrophy and chronic small vessel ischemia. Electronically Signed   By: Narda Rutherford M.D.   On: 08/31/2020 18:00   DG Chest Port 1 View  Result Date: 08/31/2020 CLINICAL DATA:  84 year old female with history of COVID-19. EXAM: PORTABLE CHEST 1 VIEW COMPARISON:  Chest x-ray 05/30/2016. FINDINGS: Lung volumes are normal. No consolidative airspace disease. No pleural effusions. No pneumothorax. No pulmonary nodule or mass noted. Pulmonary vasculature and the cardiomediastinal silhouette are within normal limits. IMPRESSION: No radiographic evidence of acute cardiopulmonary disease. Electronically Signed   By: Trudie Reed M.D.   On: 08/31/2020 17:40   DG Shoulder Left  Result Date: 08/31/2020 CLINICAL DATA:  Pain after unwitnessed fall. EXAM: LEFT SHOULDER - 2+ VIEW  COMPARISON:  None. FINDINGS: Portable AP and oblique images of the shoulder obtained. Limited assessment of glenohumeral alignment. Humeral head appears centered on the glenoid on the oblique view, however appears slightly anteriorly subluxed on AP view. No acute fracture. Degenerative change of the  humeral head and acromioclavicular joint. IMPRESSION: Limited assessment of glenohumeral alignment. This appears slightly anterior subluxed on the AP view, but humeral head centered in the glenoid on oblique. Recommend axillary view if patient is able. No acute fracture. Electronically Signed   By: Narda Rutherford M.D.   On: 08/31/2020 17:47   DG Hip Unilat W or Wo Pelvis 1 View Left  Result Date: 08/31/2020 CLINICAL DATA:  Unwitnessed fall last night. Left hip pain. EXAM: DG HIP (WITH OR WITHOUT PELVIS) 1V*L* COMPARISON:  None. FINDINGS: AP view of the pelvis with AP view of the left hip. The cortical margins of the bony pelvis and left hip are intact. No fracture. Pubic symphysis and sacroiliac joints are congruent. Mild bilateral hip osteoarthritis. Both femoral heads are well-seated in the respective acetabula. IMPRESSION: No pelvic or left hip fracture. Electronically Signed   By: Narda Rutherford M.D.   On: 08/31/2020 17:45

## 2020-09-02 NOTE — Progress Notes (Signed)
Skilled Nursing Rehab Facilities-   https://www.medicare.gov/care-compare/    Ratings out of 5 possible   Name          Address       Phone #     Quality Care                                       Staffing-Health-Inspection-Overall [facility]-[address], [place], [phone].     3 2 2 2 [facility]- [address], [place], [phone].          3 2 2 2 [facility]-[address], [place], [phone].   1     2         1          1   

## 2020-09-02 NOTE — TOC Progression Note (Signed)
Transition of Care [facility]) - Progression Note    Patient Details  Name: Amy Andrade MRN: [id number] Date of Birth: [birth year]  Transition of Care [facility]) CM/SW Contact  Mearl Latin, LCSW Phone Number: 09/02/2020, 9:03 AM  Clinical Narrative:    CSW received consult for possible SNF placement at time of discharge. CSW spoke with patient's son. Patient's son reported that they are currently unable to care for patient at their home given patient's current physical needs and fall risk. Patient's son expressed understanding of PT recommendation and is agreeable to SNF placement at time of discharge. CSW discussed insurance authorization process and provided Medicare SNF ratings list. Patient's son is aware that the COVID accepting SNFs are [facility] (waitlist), [facility] (no response), and [facility].    Expected Discharge Plan: Skilled Nursing Facility Barriers to Discharge: Insurance Authorization,SNF Pending bed offer,Continued Medical Work up  [facility] Expected Discharge Plan: Skilled Nursing Facility In-house Referral: Clinical Social Work Discharge Planning Services: [employer] Consult Post Acute Care Choice: Skilled Nursing Facility Living arrangements for the past 2 months: Single Family Home                                       Social Determinants of Health (SDOH) Interventions    Readmission Risk Interventions No flowsheet data found.

## 2020-09-02 NOTE — TOC CAGE-AID Note (Signed)
Transition of Care [facility]) - CAGE-AID Screening   Patient Details  Name: Amy Andrade MRN: [id number] Date of Birth: Jan 16, 1936  Transition of Care [facility]) CM/SW Contact:    Mearl Latin, LCSW Phone Number: 09/02/2020, 3:43 PM   Clinical Narrative: Patient unable to participate due to Dementia Diagnosis (Disoriented x4).    CAGE-AID Screening: Substance Abuse Screening unable to be completed due to: : Patient unable to participate

## 2020-09-02 NOTE — NC FL2 (Signed)
[place] MEDICAID FL2 LEVEL OF CARE SCREENING TOOL     IDENTIFICATION  Patient Name: Amy Andrade Birthdate: [birth year] Sex: female Admission Date (Current Location): 08/31/2020  [facility] and [place] Number:  Producer, television/film/video and Address:  [facility], [address] [place], [place] [address]      Provider Number: [id number]  Attending Physician Name and Address:  Maretta Bees, MD  Relative Name and Phone Number:  Judeth Cornfield [phone]    Current Level of Care: Hospital Recommended Level of Care: Skilled Nursing Facility Prior Approval Number:    Date Approved/Denied:   PASRR Number: [id number]  Discharge Plan: SNF    Current Diagnoses: Patient Active Problem List   Diagnosis Date Noted  . Femur fracture, left (HCC) 09/01/2020  . Closed fracture of left distal femur (HCC) 08/31/2020  . Coronavirus infection 08/31/2020  . Chronic left shoulder pain 04/25/2020  . Chronic bilateral low back pain without sciatica 04/25/2020  . Uncomplicated alcohol dependence (HCC) 04/25/2020  . Hyponatremia 05/31/2016  . Incidental lung nodule 05/30/2016  . History of pulmonary embolism 05/30/2016  . Alzheimer disease (HCC)   . Asthma   . Hyperlipidemia   . Hypertension   . Skin lesion of chest wall 06/22/2011    Orientation RESPIRATION BLADDER Height & Weight      (Disoriented)  Normal Continent,External catheter Weight: 190 lb (86.2 kg) Height:  5\' 8"  (172.7 cm)  BEHAVIORAL SYMPTOMS/MOOD NEUROLOGICAL BOWEL NUTRITION STATUS      Continent Diet (Please see DC Summary)  AMBULATORY STATUS COMMUNICATION OF NEEDS Skin   Extensive Assist Verbally Normal                       Personal Care Assistance Level of Assistance  Bathing,Feeding,Dressing Bathing Assistance: Maximum assistance Feeding assistance: Limited assistance Dressing Assistance: Maximum assistance     Functional Limitations Info             SPECIAL CARE  FACTORS FREQUENCY  PT (By licensed PT),OT (By licensed OT)     PT Frequency: 5x/week OT Frequency: 5x/week            Contractures Contractures Info: Not present    Additional Factors Info  Code Status,Allergies,Isolation Precautions Code Status Info: Full Allergies Info: NKA     Isolation Precautions Info: COVID+     Current Medications (09/02/2020):  This is the current hospital active medication list Current Facility-Administered Medications  Medication Dose Route Frequency Provider Last Rate Last Admin  . 0.9 %  sodium chloride infusion   Intravenous PRN 09/04/2020, MD      . 0.9 %  sodium chloride infusion   Intravenous Continuous Ghimire, Synetta Fail, MD      . albuterol (VENTOLIN HFA) 108 (90 Base) MCG/ACT inhaler 2 puff  2 puff Inhalation Q6H PRN Werner Lean, MD      . albuterol (VENTOLIN HFA) 108 (90 Base) MCG/ACT inhaler 2 puff  2 puff Inhalation Once PRN Synetta Fail, MD      . amLODipine (NORVASC) tablet 10 mg  10 mg Oral Daily Synetta Fail, MD   10 mg at 09/01/20 0851  . aspirin EC tablet 81 mg  81 mg Oral Daily 09/03/20, MD   81 mg at 09/01/20 0850  . diphenhydrAMINE (BENADRYL) injection 50 mg  50 mg Intravenous Once PRN 09/03/20, MD      . enoxaparin (LOVENOX) injection 40 mg  40 mg Subcutaneous Q24H Maretta Bees, MD   40 mg at 09/01/20 1304  . EPINEPHrine (EPI-PEN) injection 0.3 mg  0.3 mg Intramuscular Once PRN Synetta Fail, MD      . famotidine (PEPCID) IVPB 20 mg premix  20 mg Intravenous Once PRN Synetta Fail, MD      . famotidine (PEPCID) tablet 20 mg  20 mg Oral Daily Maretta Bees, MD   20 mg at 09/01/20 1304  . HYDROcodone-acetaminophen (NORCO/VICODIN) 5-325 MG per tablet 1 tablet  1 tablet Oral Q6H PRN Synetta Fail, MD   1 tablet at 09/02/20 0432  . irbesartan (AVAPRO) tablet 300 mg  300 mg Oral Daily Synetta Fail, MD   300 mg at 09/01/20 0850  . LORazepam (ATIVAN)  tablet 0.5 mg  0.5 mg Oral Q12H PRN Opyd, Lavone Neri, MD   0.5 mg at 09/02/20 0432  . methylPREDNISolone sodium succinate (SOLU-MEDROL) 125 mg/2 mL injection 125 mg  125 mg Intravenous Once PRN Synetta Fail, MD      . mometasone-formoterol [facility]) 200-5 MCG/ACT inhaler 2 puff  2 puff Inhalation BID Synetta Fail, MD   2 puff at 09/01/20 2002  . morphine 4 MG/ML injection 3 mg  3 mg Intravenous Q4H PRN Opyd, Lavone Neri, MD   3 mg at 09/01/20 2001  . polyethylene glycol (MIRALAX / GLYCOLAX) packet 17 g  17 g Oral Daily PRN Synetta Fail, MD      . pravastatin (PRAVACHOL) tablet 40 mg  40 mg Oral Daily Synetta Fail, MD   40 mg at 09/01/20 2355     Discharge Medications: Please see discharge summary for a list of discharge medications.  Relevant Imaging Results:  Relevant Lab Results:   Additional Information SSN: [id number] [place], [place]

## 2020-09-03 ENCOUNTER — Other Ambulatory Visit: Payer: Self-pay

## 2020-09-03 ENCOUNTER — Encounter (HOSPITAL_COMMUNITY): Payer: Self-pay | Admitting: Internal Medicine

## 2020-09-03 DIAGNOSIS — J4520 Mild intermittent asthma, uncomplicated: Secondary | ICD-10-CM

## 2020-09-03 LAB — COMPREHENSIVE METABOLIC PANEL
ALT: 18 U/L (ref 0–44)
AST: 29 U/L (ref 15–41)
Albumin: 3 g/dL — ABNORMAL LOW (ref 3.5–5.0)
Alkaline Phosphatase: 44 U/L (ref 38–126)
Anion gap: 10 (ref 5–15)
BUN: 13 mg/dL (ref 8–23)
CO2: 23 mmol/L (ref 22–32)
Calcium: 8.7 mg/dL — ABNORMAL LOW (ref 8.9–10.3)
Chloride: 97 mmol/L — ABNORMAL LOW (ref 98–111)
Creatinine, Ser: 0.83 mg/dL (ref 0.44–1.00)
GFR, Estimated: >60 mL/min (ref >60)
Glucose, Bld: 107 mg/dL — ABNORMAL HIGH (ref 70–99)
Potassium: 4.1 mmol/L (ref 3.5–5.1)
Sodium: 130 mmol/L — ABNORMAL LOW (ref 135–145)
Total Bilirubin: 0.8 mg/dL (ref 0.3–1.2)
Total Protein: 5.7 g/dL — ABNORMAL LOW (ref 6.5–8.1)

## 2020-09-03 LAB — C-REACTIVE PROTEIN: CRP: 3 mg/dL — ABNORMAL HIGH (ref <1.0)

## 2020-09-03 LAB — CBC
HCT: 34.7 % — ABNORMAL LOW (ref 36.0–46.0)
Hemoglobin: 12.2 g/dL (ref 12.0–15.0)
MCH: 33.3 pg (ref 26.0–34.0)
MCHC: 35.2 g/dL (ref 30.0–36.0)
MCV: 94.8 fL (ref 80.0–100.0)
Platelets: 171 10*3/uL (ref 150–400)
RBC: 3.66 MIL/uL — ABNORMAL LOW (ref 3.87–5.11)
RDW: 12.3 % (ref 11.5–15.5)
WBC: 2.6 10*3/uL — ABNORMAL LOW (ref 4.0–10.5)
nRBC: 0 % (ref 0.0–0.2)

## 2020-09-03 LAB — D-DIMER, QUANTITATIVE: D-Dimer, Quant: 1.37 ug/mL-FEU — ABNORMAL HIGH (ref 0.00–0.50)

## 2020-09-03 MED ORDER — INFLUENZA VAC A&B SA ADJ QUAD 0.5 ML IM PRSY: 0.5000 mL | PREFILLED_SYRINGE | INTRAMUSCULAR | Status: DC

## 2020-09-03 MED ORDER — ENSURE ENLIVE PO LIQD
237.0000 mL | Freq: Two times a day (BID) | ORAL | Status: DC
  Administered 2020-09-03 – 2020-09-05 (×2): 237 mL via ORAL

## 2020-09-03 MED ORDER — INFLUENZA VAC A&B SA ADJ QUAD 0.5 ML IM PRSY: 0.5000 mL | PREFILLED_SYRINGE | Freq: Once | INTRAMUSCULAR | Status: DC

## 2020-09-03 NOTE — Progress Notes (Signed)
PROGRESS NOTE                                                                                                                                                                                                             Patient Demographics:    Amy Andrade, is a 84 y.o. female, DOB - 04/02/1936, BSW:[id number]  Outpatient Primary MD for the patient is Kermit Balo, DO   Admit date - 08/31/2020   LOS - 2  Chief Complaint  Patient presents with  . Fall       Brief Narrative: Patient is a 84 y.o. female with PMHx of dementia, HTN, asthma, chronic pain, chronic hyponatremia-presented to the hospital following episode of fall-found to have left femur fracture and incidental COVID-19 infection.  COVID-19 vaccinated status: Unvaccinated  Significant Events: 12/15>> Admit to [facility] for left femur fracture, incidental COVID-19 infection  Significant studies: 12/15>> x-ray hip left: No pelvic/hip fracture 12/15>> x-ray left knee: Undisplaced distal left femoral fracture 12/15>> x-ray left shoulder: No acute fracture 12/15>> x-ray left wrist: No acute fracture 12/15>> CT head: No acute abnormalities 12/15>>Chest x-ray: No pneumonia  COVID-19 medications: 12/15>> monoclonal antibody x1  Antibiotics: None  Microbiology data: None  Procedures: None  Consults: Orthopedics  DVT prophylaxis: enoxaparin (LOVENOX) injection 40 mg Start: 09/01/20 1145 Place and maintain sequential compression device Start: 08/31/20 1917 SQ Lovenox    Subjective:   Patient in bed, appears comfortable, denies any headache, no fever, no chest pain or pressure, no shortness of breath , no abdominal pain. No focal weakness.   Assessment  & Plan :   Left distal femur fracture: Following a fall-appreciate orthopedics evaluation-nonoperative management recommended.  Remain in knee immobilizer and TTWB.  Follow-up with Dr. Sherlean Foot in 3  weeks.  Plans are for SNF on discharge-social work following.    HTN: Stable-continue amlodipine/irbesartan.  HLD: Continue statin  Asthma: Stable-not wheezing-continue Dulera, and as needed albuterol.  Chronic left shoulder/low back pain: Stable-supportive care.  Hyponatremia: Appears to be chronic-but per nursing report-poor oral intake since admission-start gentle hydration with IV fluids overnight to see if improvement.  If no improvement with IV fluids-May need Lasix/Samsca.  Check  serum osmolality/urine osmolality/TSH/random urine sodium.   History of PE/VTE: No longer on anticoagulation per patient report.  Start prophylactic Lovenox  COVID-19 infection: Asymptomatic-s/p monoclonal antibody infusion  on 12/15. Son has COVID as well.  Per patient's family-treating patient had some symptoms of cough/weakness around 12/8-12/9  Fever: afebrile O2 requirements:  SpO2: 97 %   COVID-19 Labs: Recent Labs    09/01/20 1101 09/02/20 0212 09/02/20 0746 09/03/20 0301  DDIMER  --   --  2.08* 1.37*  CRP 4.4* 3.9*  --  3.0*    No results found for: BNP  No results for input(s): PROCALCITON in the last 168 hours.  Lab Results  Component Value Date   SARSCOV2NAA POSITIVE (A) 08/31/2020     GI prophylaxis:    Condition - Stable  Family Communication  :Son Clovis Riley [phone]) updated over the phone 12/17  Code Status :  Full Code  Diet :  Diet Order            Diet Heart Room service appropriate? Yes; Fluid consistency: Thin  Diet effective now                  Disposition Plan  :   Status is: Inpatient  Remains inpatient appropriate because:Inpatient level of care appropriate due to severity of illness  Dispo: The patient is from: Home              Anticipated d/c is to: SNF              Anticipated d/c date is: > 3 days              Patient currently is not medically stable to d/c.   Barriers to discharge: Left femur  fracture-hyponatremia-  Antimicorbials  :    Anti-infectives (From admission, onward)   None      Inpatient Medications  Scheduled Meds: . amLODipine  10 mg Oral Daily  . aspirin EC  81 mg Oral Daily  . enoxaparin (LOVENOX) injection  40 mg Subcutaneous Q24H  . famotidine  20 mg Oral Daily  . feeding supplement  237 mL Oral BID BM  . [START ON 09/06/2020] influenza vaccine adjuvanted  0.5 mL Intramuscular Once  . irbesartan  300 mg Oral Daily  . mometasone-formoterol  2 puff Inhalation BID  . pravastatin  40 mg Oral Daily   Continuous Infusions:  PRN Meds:.albuterol, HYDROcodone-acetaminophen, morphine injection, polyethylene glycol   Time Spent in minutes  25  See all Orders from today for further details   Susa Raring M.D on 09/03/2020 at 11:27 AM  To page go to [email or web address] - use universal password  [facility] -  Office  [phone]    Objective:   Vitals:   09/02/20 1436 09/02/20 2030 09/03/20 0427 09/03/20 0904  BP: 131/62 129/62 138/61 (!) 131/57  Pulse: 64 64 60   Resp: Temp: 98.8 F (37.1 C) 98.5 F (36.9 C) 98.5 F (36.9 C)   TempSrc: Oral Oral Oral   SpO2: 96%  97%   Weight:      Height:        Wt Readings from Last 3 Encounters:  08/31/20 86.2 kg  04/25/20 85.5 kg  04/28/19 81.8 kg     Intake/Output Summary (Last 24 hours) at 09/03/2020 1127 Last data filed at 09/03/2020 1020 Gross per 24 hour  Intake 932.76 ml  Output --  Net 932.76 ml     Physical Exam  Awake Alert, No new F.N deficits, Normal affect [place].AT,PERRAL Supple Neck,No JVD, No cervical lymphadenopathy appriciated.  Symmetrical Chest wall movement, Good air movement bilaterally, CTAB RRR,No Gallops, Rubs or new  Murmurs, No Parasternal Heave +ve B.Sounds, Abd Soft, No tenderness, No organomegaly appriciated, No rebound - guarding or rigidity. No Cyanosis, Clubbing or edema, No new Rash or bruise    Data Review:    CBC Recent Labs  Lab  08/31/20 1412 09/01/20 0638 09/02/20 0212 09/03/20 0301  WBC 4.3 3.2* 3.4* 2.6*  HGB 13.8 12.6 12.9 12.2  HCT 39.6 37.0 35.4* 34.7*  PLT 209 180 179 171  MCV 95.2 95.4 92.4 94.8  MCH 33.2 32.5 33.7 33.3  MCHC 34.8 34.1 36.4* 35.2  RDW 12.4 12.5 12.1 12.3  LYMPHSABS 0.5*  --   --   --   MONOABS 0.5  --   --   --   EOSABS 0.0  --   --   --   BASOSABS 0.0  --   --   --     Chemistries  Recent Labs  Lab 08/31/20 1412 09/01/20 0638 09/02/20 0212 09/03/20 0301  NA 129* 129* 127* 130*  K 4.1 3.9 3.9 4.1  CL 94* 96* 93* 97*  CO2 23 22 21* 23  GLUCOSE 108* 104* 105* 107*  BUN 11 13 9 13   CREATININE 0.82 0.81 0.77 0.83  CALCIUM 9.2 8.9 8.6* 8.7*  AST 44*  --   --  29  ALT 23  --   --  18  ALKPHOS 64  --   --  44  BILITOT 1.0  --   --  0.8   ------------------------------------------------------------------------------------------------------------------ No results for input(s): CHOL, HDL, LDLCALC, TRIG, CHOLHDL, LDLDIRECT in the last 72 hours.  Lab Results  Component Value Date   HGBA1C 6.4 (H) 04/19/2015   ------------------------------------------------------------------------------------------------------------------ Recent Labs    09/02/20 0746  TSH 1.460   ------------------------------------------------------------------------------------------------------------------ No results for input(s): VITAMINB12, FOLATE, FERRITIN, TIBC, IRON, RETICCTPCT in the last 72 hours.  Coagulation profile Recent Labs  Lab 08/31/20 1908  INR 1.0    Recent Labs    09/02/20 0746 09/03/20 0301  DDIMER 2.08* 1.37*    Cardiac Enzymes No results for input(s): CKMB, TROPONINI, MYOGLOBIN in the last 168 hours.  Invalid input(s): CK ------------------------------------------------------------------------------------------------------------------ No results found for: BNP  Micro Results Recent Results (from the past 240 hour(s))  Resp Panel by RT-PCR (Flu A&B, Covid)  Nasopharyngeal Swab     Status: Abnormal   Collection Time: 08/31/20 12:55 PM   Specimen: Nasopharyngeal Swab; Nasopharyngeal(NP) swabs in vial transport medium  Result Value Ref Range Status   SARS Coronavirus 2 by RT PCR POSITIVE (A) NEGATIVE Final    Comment: emailed L. Berdik RN 15:15 08/31/20 ([email or web address]) (NOTE) SARS-CoV-2 target nucleic acids are DETECTED.  The SARS-CoV-2 RNA is generally detectable in upper respiratory specimens during the acute phase of infection. Positive results are indicative of the presence of the identified virus, but do not rule out bacterial infection or co-infection with other pathogens not detected by the test. Clinical correlation with patient history and other diagnostic information is necessary to determine patient infection status. The expected result is Negative.  Fact Sheet for Patients: 09/02/20  Fact Sheet for Healthcare Providers: BloggerCourse.com  This test is not yet approved or cleared by the [place] FDA and  has been authorized for detection and/or diagnosis of SARS-CoV-2 by FDA under an Emergency Use Authorization (EUA).  This EUA will remain in effect (meaning this test can be used) for the duration of  the COVID- 19 declaration under Section 564(b)(1) of the Act, 21 U.S.C. section 360bbb-3(b)(1), unless the authorization is terminated or  revoked sooner.     Influenza A by PCR NEGATIVE NEGATIVE Final   Influenza B by PCR NEGATIVE NEGATIVE Final    Comment: (NOTE) The Xpert Xpress SARS-CoV-2/FLU/RSV plus assay is intended as an aid in the diagnosis of influenza from Nasopharyngeal swab specimens and should not be used as a sole basis for treatment. Nasal washings and aspirates are unacceptable for Xpert Xpress SARS-CoV-2/FLU/RSV testing.  Fact Sheet for Patients: BloggerCourse.comhttps://www.fda.gov/media/152166/download  Fact Sheet for Healthcare  Providers: SeriousBroker.ithttps://www.fda.gov/media/152162/download  This test is not yet approved or cleared by the [place] FDA and has been authorized for detection and/or diagnosis of SARS-CoV-2 by FDA under an Emergency Use Authorization (EUA). This EUA will remain in effect (meaning this test can be used) for the duration of the COVID-19 declaration under Section 564(b)(1) of the Act, 21 U.S.C. section 360bbb-3(b)(1), unless the authorization is terminated or revoked.  Performed at [facility], [address] [place], [place] [address]     Radiology Reports DG Wrist Complete Left  Result Date: 08/31/2020 CLINICAL DATA:  Unwitnessed fall, pain. EXAM: LEFT WRIST - COMPLETE 3+ VIEW COMPARISON:  None. FINDINGS: No acute fracture. There is mild widening of the scapholunate interval of unknown acuity. Advanced osteoarthritis at the thumb carpal metacarpal joint. Mild radiocarpal osteoarthritis. Osteoarthritis of the metacarpal phalangeal joints most prominently involving the middle finger. There is chondrocalcinosis of the triangular fibrocartilage. There is no focal soft tissue abnormality. IMPRESSION: 1. No acute fracture of the left wrist. 2. Widening of the scapholunate interval of unknown acuity. 3. Multifocal osteoarthritis. Chondrocalcinosis of the triangular fibrocartilage. Electronically Signed   By: Narda RutherfordMelanie  Sanford M.D.   On: 08/31/2020 17:48   DG Knee 2 Views Left  Result Date: 08/31/2020 CLINICAL DATA:  Recent fall with leg pain, initial encounter EXAM: LEFT KNEE - 1-2 VIEW COMPARISON:  None. FINDINGS: Left knee prosthesis is noted in satisfactory position. There is a fracture in the medial aspect of the distal femoral metaphysis with mild displacement identified. Significant joint effusion is seen. No other focal abnormality is noted. IMPRESSION: Undisplaced distal left femoral fracture. Electronically Signed   By: Alcide CleverMark  Lukens M.D.   On: 08/31/2020 13:15   CT Head Wo  Contrast  Result Date: 08/31/2020 CLINICAL DATA:  Fall, head trauma. EXAM: CT HEAD WITHOUT CONTRAST TECHNIQUE: Contiguous axial images were obtained from the base of the skull through the vertex without intravenous contrast. COMPARISON:  None. FINDINGS: Brain: No hemorrhage. No extra-axial or subdural collection. There is equivocal loss of gray-white differentiation in the right occipital lobe versus artifact. Generalized atrophy moderate to advanced periventricular and deep white matter chronic small vessel ischemia. Bilateral basal gangliar mineralization is typically senescent. No hydrocephalus. Vascular: Atherosclerosis of skullbase vasculature without hyperdense vessel or abnormal calcification. Skull: No fracture or focal lesion. Sinuses/Orbits: Paranasal sinuses and mastoid air cells are clear. The visualized orbits are unremarkable. Frontal sinuses are hypo pneumatized. Other: None. IMPRESSION: 1. Equivocal loss of gray-white differentiation in the right occipital lobe versus artifact. Recommend correlation with neurologic symptoms. If there is clinical concern for acute infarct, recommend MRI for further evaluation. 2. No hemorrhage. 3. Generalized atrophy and chronic small vessel ischemia. Electronically Signed   By: Narda RutherfordMelanie  Sanford M.D.   On: 08/31/2020 18:00   DG Chest Port 1 View  Result Date: 08/31/2020 CLINICAL DATA:  84 year old female with history of COVID-19. EXAM: PORTABLE CHEST 1 VIEW COMPARISON:  Chest x-ray 05/30/2016. FINDINGS: Lung volumes are normal. No consolidative airspace disease. No pleural effusions.  No pneumothorax. No pulmonary nodule or mass noted. Pulmonary vasculature and the cardiomediastinal silhouette are within normal limits. IMPRESSION: No radiographic evidence of acute cardiopulmonary disease. Electronically Signed   By: Trudie Reed M.D.   On: 08/31/2020 17:40   DG Shoulder Left  Result Date: 08/31/2020 CLINICAL DATA:  Pain after unwitnessed fall. EXAM:  LEFT SHOULDER - 2+ VIEW COMPARISON:  None. FINDINGS: Portable AP and oblique images of the shoulder obtained. Limited assessment of glenohumeral alignment. Humeral head appears centered on the glenoid on the oblique view, however appears slightly anteriorly subluxed on AP view. No acute fracture. Degenerative change of the humeral head and acromioclavicular joint. IMPRESSION: Limited assessment of glenohumeral alignment. This appears slightly anterior subluxed on the AP view, but humeral head centered in the glenoid on oblique. Recommend axillary view if patient is able. No acute fracture. Electronically Signed   By: Narda Rutherford M.D.   On: 08/31/2020 17:47   DG Hip Unilat W or Wo Pelvis 1 View Left  Result Date: 08/31/2020 CLINICAL DATA:  Unwitnessed fall last night. Left hip pain. EXAM: DG HIP (WITH OR WITHOUT PELVIS) 1V*L* COMPARISON:  None. FINDINGS: AP view of the pelvis with AP view of the left hip. The cortical margins of the bony pelvis and left hip are intact. No fracture. Pubic symphysis and sacroiliac joints are congruent. Mild bilateral hip osteoarthritis. Both femoral heads are well-seated in the respective acetabula. IMPRESSION: No pelvic or left hip fracture. Electronically Signed   By: Narda Rutherford M.D.   On: 08/31/2020 17:45

## 2020-09-03 NOTE — Progress Notes (Addendum)
Initial Nutrition Assessment  DOCUMENTATION CODES:   Not applicable  INTERVENTION:  Provide Ensure Enlive po BID, each supplement provides 350 kcal and 20 grams of protein  Encourage adequate PO intake.   NUTRITION DIAGNOSIS:   Increased nutrient needs related to catabolic illness (COVID) as evidenced by estimated needs.  GOAL:   Patient will meet greater than or equal to 90% of their needs  MONITOR:   PO intake,Supplement acceptance,Skin,Weight trends,Labs,I & O's  REASON FOR ASSESSMENT:   Malnutrition Screening Tool    ASSESSMENT:   84 y.o. female with PMHx of dementia, HTN, asthma, chronic pain, chronic hyponatremia-presented to the hospital following episode of fall-found to have left femur fracture and incidental COVID-19 infection. Per MD, plan non-operative treatment for L femur fx.   RD unable to obtain pt nutrition history at this time. Pt with history of dementia. Meal completion has been mostly 60-100%. Pt currently has Ensure ordered and has been consuming them. RD to continue nutritional supplement orders to aid in caloric and protein needs. Unable to complete Nutrition-Focused physical exam at this time.   Labs and medications reviewed.   Diet Order:   Diet Order            Diet Heart Room service appropriate? Yes; Fluid consistency: Thin  Diet effective now                 EDUCATION NEEDS:   Not appropriate for education at this time  Skin:  Skin Assessment: Reviewed RN Assessment  Last BM:  Unknown  Height:   Ht Readings from Last 1 Encounters:  08/31/20 5\' 8"  (1.727 m)    Weight:   Wt Readings from Last 1 Encounters:  08/31/20 86.2 kg   BMI:  Body mass index is 28.89 kg/m.  Estimated Nutritional Needs:   Kcal:  1850-2100  Protein:  90-105 grams  Fluid:  >/= 1.8 L/day  09/02/20, MS, RD, LDN RD pager number/after hours weekend pager number on Amion.

## 2020-09-04 LAB — CBC WITH DIFFERENTIAL/PLATELET
Abs Immature Granulocytes: 0.01 10*3/uL (ref 0.00–0.07)
Basophils Absolute: 0 10*3/uL (ref 0.0–0.1)
Basophils Relative: 0 %
Eosinophils Absolute: 0 10*3/uL (ref 0.0–0.5)
Eosinophils Relative: 1 %
HCT: 40.6 % (ref 36.0–46.0)
Hemoglobin: 13.8 g/dL (ref 12.0–15.0)
Immature Granulocytes: 0 %
Lymphocytes Relative: 30 %
Lymphs Abs: 1.1 10*3/uL (ref 0.7–4.0)
MCH: 32.4 pg (ref 26.0–34.0)
MCHC: 34 g/dL (ref 30.0–36.0)
MCV: 95.3 fL (ref 80.0–100.0)
Monocytes Absolute: 0.4 10*3/uL (ref 0.1–1.0)
Monocytes Relative: 11 %
Neutro Abs: 2.3 10*3/uL (ref 1.7–7.7)
Neutrophils Relative %: 58 %
Platelets: 211 10*3/uL (ref 150–400)
RBC: 4.26 MIL/uL (ref 3.87–5.11)
RDW: 12 % (ref 11.5–15.5)
WBC: 3.8 10*3/uL — ABNORMAL LOW (ref 4.0–10.5)
nRBC: 0 % (ref 0.0–0.2)

## 2020-09-04 LAB — COMPREHENSIVE METABOLIC PANEL
ALT: 20 U/L (ref 0–44)
AST: 30 U/L (ref 15–41)
Albumin: 3.4 g/dL — ABNORMAL LOW (ref 3.5–5.0)
Alkaline Phosphatase: 54 U/L (ref 38–126)
Anion gap: 12 (ref 5–15)
BUN: 8 mg/dL (ref 8–23)
CO2: 24 mmol/L (ref 22–32)
Calcium: 9 mg/dL (ref 8.9–10.3)
Chloride: 96 mmol/L — ABNORMAL LOW (ref 98–111)
Creatinine, Ser: 0.72 mg/dL (ref 0.44–1.00)
GFR, Estimated: >60 mL/min (ref >60)
Glucose, Bld: 120 mg/dL — ABNORMAL HIGH (ref 70–99)
Potassium: 3.7 mmol/L (ref 3.5–5.1)
Sodium: 132 mmol/L — ABNORMAL LOW (ref 135–145)
Total Bilirubin: 1 mg/dL (ref 0.3–1.2)
Total Protein: 6.8 g/dL (ref 6.5–8.1)

## 2020-09-04 LAB — BRAIN NATRIURETIC PEPTIDE: B Natriuretic Peptide: 31.5 pg/mL (ref 0.0–100.0)

## 2020-09-04 LAB — C-REACTIVE PROTEIN: CRP: 2.5 mg/dL — ABNORMAL HIGH

## 2020-09-04 LAB — D-DIMER, QUANTITATIVE: D-Dimer, Quant: 1.85 ug{FEU}/mL — ABNORMAL HIGH (ref 0.00–0.50)

## 2020-09-04 LAB — MAGNESIUM: Magnesium: 1.7 mg/dL (ref 1.7–2.4)

## 2020-09-04 NOTE — Progress Notes (Signed)
PROGRESS NOTE                                                                                                                                                                                                             Patient Demographics:    Amy Andrade, is a 84 y.o. female, DOB - 09/28/1935, UVO:[id number]  Outpatient Primary MD for the patient is Kermit Balo, DO   Admit date - 08/31/2020   LOS - 3  Chief Complaint  Patient presents with  . Fall       Brief Narrative:  Patient is a 84 y.o. female with PMHx of dementia, HTN, asthma, chronic pain, chronic hyponatremia-presented to the hospital following episode of fall-found to have left femur fracture and incidental COVID-19 infection.  COVID-19 vaccinated status: Unvaccinated  Significant Events: 12/15>> Admit to [facility] for left femur fracture, incidental COVID-19 infection  Significant studies: 12/15>> x-ray hip left: No pelvic/hip fracture 12/15>> x-ray left knee: Undisplaced distal left femoral fracture 12/15>> x-ray left shoulder: No acute fracture 12/15>> x-ray left wrist: No acute fracture 12/15>> CT head: No acute abnormalities 12/15>>Chest x-ray: No pneumonia  COVID-19 medications: 12/15>> monoclonal antibody x1  Antibiotics: None  Microbiology data: None  Procedures: None  Consults: Orthopedics  DVT prophylaxis:  enoxaparin (LOVENOX) injection 40 mg Start: 09/01/20 1145 Place and maintain sequential compression device Start: 08/31/20 1917 SQ Lovenox    Subjective:   Patient in bed, appears comfortable, denies any headache, no fever, no chest pain or pressure, no shortness of breath , no abdominal pain. No focal weakness.   Assessment  & Plan :   Left distal femur fracture: Following a fall-appreciate orthopedics evaluation-nonoperative management recommended.  Remain in knee immobilizer and TTWB.  Follow-up with Dr. Sherlean Foot in 3  weeks.  Plans are for SNF on discharge-social work following.    HTN: Stable-continue amlodipine/irbesartan.  HLD: Continue statin  Asthma: Stable-not wheezing-continue Dulera, and as needed albuterol.  Chronic left shoulder/low back pain: Stable-supportive care.  Hyponatremia: Appears to be chronic-but per nursing report-poor oral intake since admission-start gentle hydration with IV fluids overnight to see if improvement.  If no improvement with IV fluids-May need Lasix/Samsca.  Check  serum osmolality/urine osmolality/TSH/random urine sodium.   History of PE/VTE: No longer on anticoagulation per patient report.  Start prophylactic Lovenox  Hospital-acquired delirium, likely early  dementia.  Per family patient gets delirious in the evening hours even at home.  Supportive care and monitor.    COVID-19 infection: Asymptomatic-s/p monoclonal antibody infusion on 12/15. Son has COVID as well.  Per patient's family patient had some symptoms of cough/weakness around 12/8-12/9   Recent Labs  Lab 08/31/20 1255 08/31/20 1412 08/31/20 1908 09/01/20 0638 09/01/20 1101 09/02/20 0212 09/02/20 0746 09/03/20 0301 09/04/20 0443  WBC  --  4.3  --  3.2*  --  3.4*  --  2.6* 3.8*  HGB  --  13.8  --  12.6  --  12.9  --  12.2 13.8  HCT  --  39.6  --  37.0  --  35.4*  --  34.7* 40.6  PLT  --  209  --  180  --  179  --  171 211  CRP  --   --   --   --  4.4* 3.9*  --  3.0* 2.5*  BNP  --   --   --   --   --   --   --   --  31.5  DDIMER  --   --   --   --   --   --  2.08* 1.37* 1.85*  AST  --  44*  --   --   --   --   --  29 30  ALT  --  23  --   --   --   --   --  18 20  ALKPHOS  --  64  --   --   --   --   --  44 54  BILITOT  --  1.0  --   --   --   --   --  0.8 1.0  ALBUMIN  --  3.8  --   --   --   --   --  3.0* 3.4*  INR  --   --  1.0  --   --   --   --   --   --   SARSCOV2NAA POSITIVE*  --   --   --   --   --   --   --   --        GI prophylaxis:    Condition - Stable  Family  Communication  :Son Clovis Riley [phone]) updated over the phone 09/02/20, 09/04/20  Code Status :  Full Code  Diet :  Diet Order            Diet Heart Room service appropriate? Yes; Fluid consistency: Thin  Diet effective now                  Disposition Plan  :   Status is: Inpatient  Remains inpatient appropriate because:Inpatient level of care appropriate due to severity of illness  Dispo: The patient is from: Home              Anticipated d/c is to: SNF              Anticipated d/c date is: > 3 days              Patient currently is not medically stable to d/c.   Barriers to discharge: Left femur fracture-hyponatremia-  Antimicorbials  :    Anti-infectives (From admission, onward)   None      Inpatient Medications  Scheduled Meds: . amLODipine  10 mg Oral Daily  . aspirin  EC  81 mg Oral Daily  . enoxaparin (LOVENOX) injection  40 mg Subcutaneous Q24H  . famotidine  20 mg Oral Daily  . feeding supplement  237 mL Oral BID BM  . [START ON 09/06/2020] influenza vaccine adjuvanted  0.5 mL Intramuscular Once  . irbesartan  300 mg Oral Daily  . mometasone-formoterol  2 puff Inhalation BID  . pravastatin  40 mg Oral Daily   Continuous Infusions:  PRN Meds:.albuterol, HYDROcodone-acetaminophen, morphine injection, polyethylene glycol   Time Spent in minutes  25  See all Orders from today for further details   Susa Raring M.D on 09/04/2020 at 10:17 AM  To page go to [email or web address] - use universal password  [facility] -  Office  [phone]    Objective:   Vitals:   09/03/20 2120 09/04/20 0327 09/04/20 0559 09/04/20 0820  BP: (!) 157/67  (!) 144/71 (!) 141/68  Pulse: 70  73 87  Resp: 16  18 17   Temp: 98 F (36.7 C)  98.4 F (36.9 C) 98 F (36.7 C)  TempSrc: Oral   Oral  SpO2: 97%   98%  Weight:  81.5 kg    Height:        Wt Readings from Last 3 Encounters:  09/04/20 81.5 kg  04/25/20 85.5 kg  04/28/19 81.8 kg      Intake/Output Summary (Last 24 hours) at 09/04/2020 1017 Last data filed at 09/04/2020 0221 Gross per 24 hour  Intake 320 ml  Output 1950 ml  Net -1630 ml     Physical Exam  Awake, mildly confused, No new F.N deficits, Normal affect [place].AT,PERRAL Supple Neck,No JVD, No cervical lymphadenopathy appriciated.  Symmetrical Chest wall movement, Good air movement bilaterally, CTAB RRR,No Gallops, Rubs or new Murmurs, No Parasternal Heave +ve B.Sounds, Abd Soft, No tenderness, No organomegaly appriciated, No rebound - guarding or rigidity. No Cyanosis, L. Knee brace.    Data Review:    CBC Recent Labs  Lab 08/31/20 1412 09/01/20 09/03/20 09/02/20 0212 09/03/20 0301 09/04/20 0443  WBC 4.3 3.2* 3.4* 2.6* 3.8*  HGB 13.8 12.6 12.9 12.2 13.8  HCT 39.6 37.0 35.4* 34.7* 40.6  PLT 209 180 179 171 211  MCV 95.2 95.4 92.4 94.8 95.3  MCH 33.2 32.5 33.7 33.3 32.4  MCHC 34.8 34.1 36.4* 35.2 34.0  RDW 12.4 12.5 12.1 12.3 12.0  LYMPHSABS 0.5*  --   --   --  1.1  MONOABS 0.5  --   --   --  0.4  EOSABS 0.0  --   --   --  0.0  BASOSABS 0.0  --   --   --  0.0    Chemistries  Recent Labs  Lab 08/31/20 1412 09/01/20 0638 09/02/20 0212 09/03/20 0301 09/04/20 0443  NA 129* 129* 127* 130* 132*  K 4.1 3.9 3.9 4.1 3.7  CL 94* 96* 93* 97* 96*  CO2 23 22 21* 23 24  GLUCOSE 108* 104* 105* 107* 120*  BUN 11 13 9 13 8   CREATININE 0.82 0.81 0.77 0.83 0.72  CALCIUM 9.2 8.9 8.6* 8.7* 9.0  MG  --   --   --   --  1.7  AST 44*  --   --  29 30  ALT 23  --   --  18 20  ALKPHOS 64  --   --  44 54  BILITOT 1.0  --   --  0.8 1.0   ------------------------------------------------------------------------------------------------------------------ No results for input(s): CHOL, HDL, LDLCALC,  TRIG, CHOLHDL, LDLDIRECT in the last 72 hours.  Lab Results  Component Value Date   HGBA1C 6.4 (H) 04/19/2015    ------------------------------------------------------------------------------------------------------------------ Recent Labs    09/02/20 0746  TSH 1.460   ------------------------------------------------------------------------------------------------------------------ No results for input(s): VITAMINB12, FOLATE, FERRITIN, TIBC, IRON, RETICCTPCT in the last 72 hours.  Coagulation profile Recent Labs  Lab 08/31/20 1908  INR 1.0    Recent Labs    09/03/20 0301 09/04/20 0443  DDIMER 1.37* 1.85*    Cardiac Enzymes No results for input(s): CKMB, TROPONINI, MYOGLOBIN in the last 168 hours.  Invalid input(s): CK ------------------------------------------------------------------------------------------------------------------    Component Value Date/Time   BNP 31.5 09/04/2020 0443    Micro Results Recent Results (from the past 240 hour(s))  Resp Panel by RT-PCR (Flu A&B, Covid) Nasopharyngeal Swab     Status: Abnormal   Collection Time: 08/31/20 12:55 PM   Specimen: Nasopharyngeal Swab; Nasopharyngeal(NP) swabs in vial transport medium  Result Value Ref Range Status   SARS Coronavirus 2 by RT PCR POSITIVE (A) NEGATIVE Final    Comment: emailed L. Berdik RN 15:15 08/31/20 ([email or web address]) (NOTE) SARS-CoV-2 target nucleic acids are DETECTED.  The SARS-CoV-2 RNA is generally detectable in upper respiratory specimens during the acute phase of infection. Positive results are indicative of the presence of the identified virus, but do not rule out bacterial infection or co-infection with other pathogens not detected by the test. Clinical correlation with patient history and other diagnostic information is necessary to determine patient infection status. The expected result is Negative.  Fact Sheet for Patients: BloggerCourse.com  Fact Sheet for Healthcare Providers: SeriousBroker.it  This test is not yet approved or cleared by  the [place] FDA and  has been authorized for detection and/or diagnosis of SARS-CoV-2 by FDA under an Emergency Use Authorization (EUA).  This EUA will remain in effect (meaning this test can be used) for the duration of  the COVID- 19 declaration under Section 564(b)(1) of the Act, 21 U.S.C. section 360bbb-3(b)(1), unless the authorization is terminated or revoked sooner.     Influenza A by PCR NEGATIVE NEGATIVE Final   Influenza B by PCR NEGATIVE NEGATIVE Final    Comment: (NOTE) The Xpert Xpress SARS-CoV-2/FLU/RSV plus assay is intended as an aid in the diagnosis of influenza from Nasopharyngeal swab specimens and should not be used as a sole basis for treatment. Nasal washings and aspirates are unacceptable for Xpert Xpress SARS-CoV-2/FLU/RSV testing.  Fact Sheet for Patients: BloggerCourse.com  Fact Sheet for Healthcare Providers: SeriousBroker.it  This test is not yet approved or cleared by the [place] FDA and has been authorized for detection and/or diagnosis of SARS-CoV-2 by FDA under an Emergency Use Authorization (EUA). This EUA will remain in effect (meaning this test can be used) for the duration of the COVID-19 declaration under Section 564(b)(1) of the Act, 21 U.S.C. section 360bbb-3(b)(1), unless the authorization is terminated or revoked.  Performed at [facility], [address], [place], [place] [address]     Radiology Reports DG Wrist Complete Left  Result Date: 08/31/2020 CLINICAL DATA:  Unwitnessed fall, pain. EXAM: LEFT WRIST - COMPLETE 3+ VIEW COMPARISON:  None. FINDINGS: No acute fracture. There is mild widening of the scapholunate interval of unknown acuity. Advanced osteoarthritis at the thumb carpal metacarpal joint. Mild radiocarpal osteoarthritis. Osteoarthritis of the metacarpal phalangeal joints most prominently involving the middle finger. There is chondrocalcinosis of the  triangular fibrocartilage. There is no focal soft tissue abnormality. IMPRESSION: 1. No acute fracture  of the left wrist. 2. Widening of the scapholunate interval of unknown acuity. 3. Multifocal osteoarthritis. Chondrocalcinosis of the triangular fibrocartilage. Electronically Signed   By: Narda Rutherford M.D.   On: 08/31/2020 17:48   DG Knee 2 Views Left  Result Date: 08/31/2020 CLINICAL DATA:  Recent fall with leg pain, initial encounter EXAM: LEFT KNEE - 1-2 VIEW COMPARISON:  None. FINDINGS: Left knee prosthesis is noted in satisfactory position. There is a fracture in the medial aspect of the distal femoral metaphysis with mild displacement identified. Significant joint effusion is seen. No other focal abnormality is noted. IMPRESSION: Undisplaced distal left femoral fracture. Electronically Signed   By: Alcide Clever M.D.   On: 08/31/2020 13:15   CT Head Wo Contrast  Result Date: 08/31/2020 CLINICAL DATA:  Fall, head trauma. EXAM: CT HEAD WITHOUT CONTRAST TECHNIQUE: Contiguous axial images were obtained from the base of the skull through the vertex without intravenous contrast. COMPARISON:  None. FINDINGS: Brain: No hemorrhage. No extra-axial or subdural collection. There is equivocal loss of gray-white differentiation in the right occipital lobe versus artifact. Generalized atrophy moderate to advanced periventricular and deep white matter chronic small vessel ischemia. Bilateral basal gangliar mineralization is typically senescent. No hydrocephalus. Vascular: Atherosclerosis of skullbase vasculature without hyperdense vessel or abnormal calcification. Skull: No fracture or focal lesion. Sinuses/Orbits: Paranasal sinuses and mastoid air cells are clear. The visualized orbits are unremarkable. Frontal sinuses are hypo pneumatized. Other: None. IMPRESSION: 1. Equivocal loss of gray-white differentiation in the right occipital lobe versus artifact. Recommend correlation with neurologic symptoms. If  there is clinical concern for acute infarct, recommend MRI for further evaluation. 2. No hemorrhage. 3. Generalized atrophy and chronic small vessel ischemia. Electronically Signed   By: Narda Rutherford M.D.   On: 08/31/2020 18:00   DG Chest Port 1 View  Result Date: 08/31/2020 CLINICAL DATA:  84 year old female with history of COVID-19. EXAM: PORTABLE CHEST 1 VIEW COMPARISON:  Chest x-ray 05/30/2016. FINDINGS: Lung volumes are normal. No consolidative airspace disease. No pleural effusions. No pneumothorax. No pulmonary nodule or mass noted. Pulmonary vasculature and the cardiomediastinal silhouette are within normal limits. IMPRESSION: No radiographic evidence of acute cardiopulmonary disease. Electronically Signed   By: Trudie Reed M.D.   On: 08/31/2020 17:40   DG Shoulder Left  Result Date: 08/31/2020 CLINICAL DATA:  Pain after unwitnessed fall. EXAM: LEFT SHOULDER - 2+ VIEW COMPARISON:  None. FINDINGS: Portable AP and oblique images of the shoulder obtained. Limited assessment of glenohumeral alignment. Humeral head appears centered on the glenoid on the oblique view, however appears slightly anteriorly subluxed on AP view. No acute fracture. Degenerative change of the humeral head and acromioclavicular joint. IMPRESSION: Limited assessment of glenohumeral alignment. This appears slightly anterior subluxed on the AP view, but humeral head centered in the glenoid on oblique. Recommend axillary view if patient is able. No acute fracture. Electronically Signed   By: Narda Rutherford M.D.   On: 08/31/2020 17:47   DG Hip Unilat W or Wo Pelvis 1 View Left  Result Date: 08/31/2020 CLINICAL DATA:  Unwitnessed fall last night. Left hip pain. EXAM: DG HIP (WITH OR WITHOUT PELVIS) 1V*L* COMPARISON:  None. FINDINGS: AP view of the pelvis with AP view of the left hip. The cortical margins of the bony pelvis and left hip are intact. No fracture. Pubic symphysis and sacroiliac joints are congruent. Mild  bilateral hip osteoarthritis. Both femoral heads are well-seated in the respective acetabula. IMPRESSION: No pelvic or left hip fracture. Electronically  Signed   By: Narda RutherfordMelanie  Sanford M.D.   On: 08/31/2020 17:45

## 2020-09-04 NOTE — Progress Notes (Signed)
Dr. Antionette Char notified due to patient's increased confusion. Pt pulled out IV line access and has been removing heart monitor throughout night. Pt has also removed her purwick causing increased bed changes and increased discomfort for patient. MD to d/c tele and leave IV out for now. Pt is eating and drinking. Will continue to monitor. Safety mitts at beside. Call bell in reach.

## 2020-09-05 DIAGNOSIS — Z7401 Bed confinement status: Secondary | ICD-10-CM | POA: Diagnosis not present

## 2020-09-05 DIAGNOSIS — R2681 Unsteadiness on feet: Secondary | ICD-10-CM | POA: Diagnosis not present

## 2020-09-05 DIAGNOSIS — Z23 Encounter for immunization: Secondary | ICD-10-CM | POA: Diagnosis present

## 2020-09-05 DIAGNOSIS — I10 Essential (primary) hypertension: Secondary | ICD-10-CM | POA: Diagnosis not present

## 2020-09-05 DIAGNOSIS — R2689 Other abnormalities of gait and mobility: Secondary | ICD-10-CM | POA: Diagnosis not present

## 2020-09-05 DIAGNOSIS — S79929A Unspecified injury of unspecified thigh, initial encounter: Secondary | ICD-10-CM | POA: Diagnosis not present

## 2020-09-05 DIAGNOSIS — R059 Cough, unspecified: Secondary | ICD-10-CM | POA: Diagnosis not present

## 2020-09-05 DIAGNOSIS — J45909 Unspecified asthma, uncomplicated: Secondary | ICD-10-CM | POA: Diagnosis not present

## 2020-09-05 DIAGNOSIS — S72472S Torus fracture of lower end of left femur, sequela: Secondary | ICD-10-CM | POA: Diagnosis not present

## 2020-09-05 DIAGNOSIS — Y92013 Bedroom of single-family (private) house as the place of occurrence of the external cause: Secondary | ICD-10-CM | POA: Diagnosis not present

## 2020-09-05 DIAGNOSIS — G309 Alzheimer's disease, unspecified: Secondary | ICD-10-CM | POA: Diagnosis not present

## 2020-09-05 DIAGNOSIS — M545 Low back pain, unspecified: Secondary | ICD-10-CM | POA: Diagnosis not present

## 2020-09-05 DIAGNOSIS — F5102 Adjustment insomnia: Secondary | ICD-10-CM | POA: Diagnosis not present

## 2020-09-05 DIAGNOSIS — J4520 Mild intermittent asthma, uncomplicated: Secondary | ICD-10-CM | POA: Diagnosis not present

## 2020-09-05 DIAGNOSIS — W010XXA Fall on same level from slipping, tripping and stumbling without subsequent striking against object, initial encounter: Secondary | ICD-10-CM | POA: Diagnosis not present

## 2020-09-05 DIAGNOSIS — R29818 Other symptoms and signs involving the nervous system: Secondary | ICD-10-CM | POA: Diagnosis not present

## 2020-09-05 DIAGNOSIS — R279 Unspecified lack of coordination: Secondary | ICD-10-CM | POA: Diagnosis not present

## 2020-09-05 DIAGNOSIS — F0280 Dementia in other diseases classified elsewhere without behavioral disturbance: Secondary | ICD-10-CM | POA: Diagnosis not present

## 2020-09-05 DIAGNOSIS — Z9181 History of falling: Secondary | ICD-10-CM | POA: Diagnosis not present

## 2020-09-05 DIAGNOSIS — Z86711 Personal history of pulmonary embolism: Secondary | ICD-10-CM | POA: Diagnosis not present

## 2020-09-05 DIAGNOSIS — E785 Hyperlipidemia, unspecified: Secondary | ICD-10-CM | POA: Diagnosis not present

## 2020-09-05 DIAGNOSIS — D72819 Decreased white blood cell count, unspecified: Secondary | ICD-10-CM | POA: Diagnosis not present

## 2020-09-05 DIAGNOSIS — R1311 Dysphagia, oral phase: Secondary | ICD-10-CM | POA: Diagnosis not present

## 2020-09-05 DIAGNOSIS — S72402A Unspecified fracture of lower end of left femur, initial encounter for closed fracture: Secondary | ICD-10-CM | POA: Diagnosis not present

## 2020-09-05 DIAGNOSIS — Z96652 Presence of left artificial knee joint: Secondary | ICD-10-CM | POA: Diagnosis not present

## 2020-09-05 DIAGNOSIS — S72402D Unspecified fracture of lower end of left femur, subsequent encounter for closed fracture with routine healing: Secondary | ICD-10-CM | POA: Diagnosis not present

## 2020-09-05 DIAGNOSIS — M2550 Pain in unspecified joint: Secondary | ICD-10-CM | POA: Diagnosis not present

## 2020-09-05 DIAGNOSIS — G8929 Other chronic pain: Secondary | ICD-10-CM | POA: Diagnosis not present

## 2020-09-05 DIAGNOSIS — E871 Hypo-osmolality and hyponatremia: Secondary | ICD-10-CM | POA: Diagnosis not present

## 2020-09-05 DIAGNOSIS — B342 Coronavirus infection, unspecified: Secondary | ICD-10-CM | POA: Diagnosis not present

## 2020-09-05 DIAGNOSIS — Z743 Need for continuous supervision: Secondary | ICD-10-CM | POA: Diagnosis not present

## 2020-09-05 DIAGNOSIS — M6281 Muscle weakness (generalized): Secondary | ICD-10-CM | POA: Diagnosis not present

## 2020-09-05 DIAGNOSIS — R296 Repeated falls: Secondary | ICD-10-CM | POA: Diagnosis not present

## 2020-09-05 DIAGNOSIS — U071 COVID-19: Secondary | ICD-10-CM | POA: Diagnosis not present

## 2020-09-05 DIAGNOSIS — M25512 Pain in left shoulder: Secondary | ICD-10-CM | POA: Diagnosis not present

## 2020-09-05 DIAGNOSIS — S72492A Other fracture of lower end of left femur, initial encounter for closed fracture: Secondary | ICD-10-CM | POA: Diagnosis not present

## 2020-09-05 MED ORDER — ENOXAPARIN SODIUM 40 MG/0.4ML ~~LOC~~ SOLN: 40.0000 mg | SUBCUTANEOUS | Status: DC

## 2020-09-05 MED ORDER — FAMOTIDINE 20 MG PO TABS: 20.0000 mg | ORAL_TABLET | Freq: Every day | ORAL | Status: DC

## 2020-09-05 MED ORDER — HYDROCODONE-ACETAMINOPHEN 5-325 MG PO TABS: 1.0000 | ORAL_TABLET | Freq: Two times a day (BID) | ORAL | 0 refills | Status: DC | PRN

## 2020-09-05 NOTE — TOC Transition Note (Addendum)
Transition of Care [facility]) - CM/SW Discharge Note   Patient Details  Name: Amy Andrade MRN: [id number] Date of Birth: 05/05/1936  [facility] CM/SW Contact:  Mearl Latin, LCSW Phone Number: 09/05/2020, 12:14 PM   Clinical Narrative:    Patient will DC to: [facility] Anticipated DC date: 09/05/20 Family notified: Son, Clovis Riley Transport by: Dayna Barker   Per MD patient ready for DC to [facility]. RN to call report prior to discharge [phone] Room 304B). RN, patient, patient's family, and facility notified of DC. Discharge Summary and FL2 sent to facility. DC packet on chart including script. Ambulance transport requested for patient.   CSW will sign off for now as social work intervention is no longer needed. Please consult [place] again if new needs arise.      Final next level of care: Skilled Nursing Facility Barriers to Discharge: Barriers Resolved   Patient Goals and CMS Choice Patient states their goals for this hospitalization and ongoing recovery are:: Rehab CMS Medicare.gov Compare Post Acute Care list provided to:: Patient Represenative (must comment) Choice offered to / list presented to : Adult Children  Discharge Placement   Existing PASRR number confirmed : 09/05/20          Patient [facility] at: [facility] and Rehab Patient to be transferred to facility by: [facility] Name of family member notified: Son, Clovis Riley Patient and family notified of of transfer: 09/05/20  Discharge Plan and Services In-house Referral: Clinical Social Work Discharge Planning Services: [employer] Consult Post Acute Care Choice: Skilled Nursing Facility                               Social Determinants of Health (SDOH) Interventions     Readmission Risk Interventions No flowsheet data found.

## 2020-09-05 NOTE — Care Management Important Message (Signed)
Important Message  Patient Details  Name: Amy Andrade MRN: [id number] Date of Birth: [birth year]   Medicare Important Message Given:  Yes - Important Message mailed due to current National Emergency  Verbal consent obtained due to current National Emergency  Relationship to patient: Self Contact Name: Jeslin Bazinet Call Date: 09/05/20  Time: 1432 Phone: [phone] Outcome: Spoke with contact Important Message mailed to: Patient address on file    Orson Aloe 09/05/2020, 2:32 PM

## 2020-09-05 NOTE — Discharge Summary (Signed)
Amy Andrade GBT:[id number] DOB: 03-17-1936 DOA: 08/31/2020  PCP: Kermit Balo, DO  Admit date: 08/31/2020  Discharge date: 09/05/2020  Admitted From: Home  Disposition:  SNF   Recommendations for Outpatient Follow-up:   Follow up with PCP in 1-2 weeks  PCP Please obtain BMP/CBC, 2 view CXR in 1week,  (see Discharge instructions)   PCP Please follow up on the following pending results:    Home Health: None Equipment/Devices: None  Consultations: Ortho Discharge Condition: Stable    CODE STATUS: Full    Diet Recommendation: Heart Healthy   Diet Order            Diet - low sodium heart healthy           Diet Heart Room service appropriate? Yes; Fluid consistency: Thin  Diet effective now                  Chief Complaint  Patient presents with  . Fall     Brief history of present illness from the day of admission and additional interim summary     Patient is a 84 y.o. female with PMHx of dementia, HTN, asthma, chronic pain, chronic hyponatremia-presented to the hospital following episode of fall-found to have left femur fracture and incidental COVID-19 infection.  COVID-19 vaccinated status: Unvaccinated  Significant Events: 12/15>> Admit to [facility] for left femur fracture, incidental COVID-19 infection  Significant studies: 12/15>> x-ray hip left: No pelvic/hip fracture 12/15>> x-ray left knee: Undisplaced distal left femoral fracture 12/15>> x-ray left shoulder: No acute fracture 12/15>> x-ray left wrist: No acute fracture 12/15>> CT head: No acute abnormalities 12/15>>Chest x-ray: No pneumonia  COVID-19 medications: 12/15>> monoclonal antibody x1  Antibiotics: None  Microbiology data: None  Procedures: None  Consults: Orthopedics                                                                  Hospital Course    Left distal femur fracture: Following a fall-appreciate orthopedics evaluation-nonoperative management recommended.  Remain in knee immobilizer and TTWB.  Follow-up with Dr. Sherlean Foot in 3 weeks.  Plans are for SNF discharge today.  HTN: Stable-continue amlodipine/irbesartan.  HLD: Continue statin  Asthma: Stable-not wheezing-continue Dulera, and as needed albuterol.  Chronic left shoulder/low back pain: Stable-supportive care.  Hyponatremia: Appears to be chronic-gentle hydration much improved.  History of PE/VTE: No longer on anticoagulation per patient report. On  prophylactic Lovenox, continue for another 2 weeks thereafter per Dr. Valentina Gu orthopedics.  Patient must follow with Dr. Valentina Gu within a week of discharge.  Hospital-acquired delirium, metabolic encephalopathy, likely underlying early dementia.  Per family patient gets delirious in the evening hours even at home.  Supportive care and monitor.    Minimize narcotics and benzodiazepines.  Delirium is mild.  COVID-19 infection: Asymptomatic-s/p  monoclonal antibody infusion on 12/15.  She is symptom-free from the standpoint.    Discharge diagnosis     Principal Problem:   Closed fracture of left distal femur (HCC) Active Problems:   Alzheimer disease (HCC)   Asthma   Hyperlipidemia   Hypertension   Hyponatremia   Coronavirus infection   Femur fracture, left (HCC)    Discharge instructions    Discharge Instructions    Diet - low sodium heart healthy   Complete by: As directed    Discharge instructions   Complete by: As directed    Follow with Primary MD Renato Gails, Tiffany L, DO in 7 days   Get CBC, CMP, 2 view Chest X ray -  checked next visit within 1 week by Primary MD or SNF MD    Activity: Toe-touch weightbearing on left leg, wear the left knee brace at all times.  Follow with orthopedics in 2 weeks.  Disposition Home    Diet: Heart Healthy with feeding  assistance and aspiration precautions.   Special Instructions: If you have smoked or chewed Tobacco  in the last 2 yrs please stop smoking, stop any regular Alcohol  and or any Recreational drug use.  On your next visit with your primary care physician please Get Medicines reviewed and adjusted.  Please request your Prim.MD to go over all Hospital Tests and Procedure/Radiological results at the follow up, please get all Hospital records sent to your Prim MD by signing hospital release before you go home.  If you experience worsening of your admission symptoms, develop shortness of breath, life threatening emergency, suicidal or homicidal thoughts you must seek medical attention immediately by calling 911 or calling your MD immediately  if symptoms less severe.  You Must read complete instructions/literature along with all the possible adverse reactions/side effects for all the Medicines you take and that have been prescribed to you. Take any new Medicines after you have completely understood and accpet all the possible adverse reactions/side effects.      Discharge Medications   Allergies as of 09/05/2020   No Known Allergies     Medication List    STOP taking these medications   albuterol 108 (90 Base) MCG/ACT inhaler Commonly known as: VENTOLIN HFA   methocarbamol 500 MG tablet Commonly known as: ROBAXIN   triamcinolone 0.1 % Commonly known as: KENALOG     TAKE these medications   acetaminophen 500 MG tablet Commonly known as: TYLENOL Take 500 mg by mouth every 6 (six) hours as needed for fever (shoulder pain).   amLODipine 10 MG tablet Commonly known as: NORVASC TAKE 1 TABLET(10 MG) BY MOUTH DAILY What changed: See the new instructions.   aspirin 81 MG tablet Take 81 mg by mouth daily.   budesonide-formoterol 160-4.5 MCG/ACT inhaler Commonly known as: Symbicort Inhale 2 puffs into the lungs 2 (two) times daily. Symbicort 120/4.5 1 puff 2 x a day   enoxaparin 40  MG/0.4ML injection Commonly known as: LOVENOX Inject 0.4 mLs (40 mg total) into the skin daily for 14 days.   famotidine 20 MG tablet Commonly known as: PEPCID Take 1 tablet (20 mg total) by mouth daily. Start taking on: September 06, 2020   HYDROcodone-acetaminophen 5-325 MG tablet Commonly known as: NORCO/VICODIN Take 1 tablet by mouth every 12 (twelve) hours as needed for severe pain.   irbesartan 300 MG tablet Commonly known as: AVAPRO TAKE 1 TABLET(300 MG) BY MOUTH DAILY What changed: See the new instructions.   nitroGLYCERIN 0.4 MG  SL tablet Commonly known as: NITROSTAT Place 1 tablet (0.4 mg total) under the tongue every 5 (five) minutes x 3 doses as needed for chest pain.   pravastatin 40 MG tablet Commonly known as: PRAVACHOL TAKE 1 TABLET(40 MG) BY MOUTH DAILY What changed: See the new instructions.        Contact information for follow-up providers    Reed, Tiffany L, DO. Schedule an appointment as soon as possible for a visit in 1 week(s).   Specialty: Geriatric Medicine Contact information: [address] [place] [address] [phone]        Dannielle Huh, MD. Schedule an appointment as soon as possible for a visit in 1 week(s).   Specialty: Orthopedic Surgery Contact information: [address] [place] [address] [phone]            Contact information for after-discharge care    Destination    [facility] Preferred SNF .   Service: Skilled Nursing Contact information: [address] [place] [address] [phone]                  Major procedures and Radiology Reports - PLEASE review detailed and final reports thoroughly  -        DG Wrist Complete Left  Result Date: 08/31/2020 CLINICAL DATA:  Unwitnessed fall, pain. EXAM: LEFT WRIST - COMPLETE 3+ VIEW COMPARISON:  None. FINDINGS: No acute fracture. There is mild widening of the scapholunate interval of unknown acuity.  Advanced osteoarthritis at the thumb carpal metacarpal joint. Mild radiocarpal osteoarthritis. Osteoarthritis of the metacarpal phalangeal joints most prominently involving the middle finger. There is chondrocalcinosis of the triangular fibrocartilage. There is no focal soft tissue abnormality. IMPRESSION: 1. No acute fracture of the left wrist. 2. Widening of the scapholunate interval of unknown acuity. 3. Multifocal osteoarthritis. Chondrocalcinosis of the triangular fibrocartilage. Electronically Signed   By: Narda Rutherford M.D.   On: 08/31/2020 17:48   DG Knee 2 Views Left  Result Date: 08/31/2020 CLINICAL DATA:  Recent fall with leg pain, initial encounter EXAM: LEFT KNEE - 1-2 VIEW COMPARISON:  None. FINDINGS: Left knee prosthesis is noted in satisfactory position. There is a fracture in the medial aspect of the distal femoral metaphysis with mild displacement identified. Significant joint effusion is seen. No other focal abnormality is noted. IMPRESSION: Undisplaced distal left femoral fracture. Electronically Signed   By: Alcide Clever M.D.   On: 08/31/2020 13:15   CT Head Wo Contrast  Result Date: 08/31/2020 CLINICAL DATA:  Fall, head trauma. EXAM: CT HEAD WITHOUT CONTRAST TECHNIQUE: Contiguous axial images were obtained from the base of the skull through the vertex without intravenous contrast. COMPARISON:  None. FINDINGS: Brain: No hemorrhage. No extra-axial or subdural collection. There is equivocal loss of gray-white differentiation in the right occipital lobe versus artifact. Generalized atrophy moderate to advanced periventricular and deep white matter chronic small vessel ischemia. Bilateral basal gangliar mineralization is typically senescent. No hydrocephalus. Vascular: Atherosclerosis of skullbase vasculature without hyperdense vessel or abnormal calcification. Skull: No fracture or focal lesion. Sinuses/Orbits: Paranasal sinuses and mastoid air cells are clear. The visualized orbits  are unremarkable. Frontal sinuses are hypo pneumatized. Other: None. IMPRESSION: 1. Equivocal loss of gray-white differentiation in the right occipital lobe versus artifact. Recommend correlation with neurologic symptoms. If there is clinical concern for acute infarct, recommend MRI for further evaluation. 2. No hemorrhage. 3. Generalized atrophy and chronic small vessel ischemia. Electronically Signed   By: Shawna Orleans  Sanford M.D.   On: 08/31/2020 18:00   DG Chest Port 1 View  Result Date: 08/31/2020 CLINICAL DATA:  84 year old female with history of COVID-19. EXAM: PORTABLE CHEST 1 VIEW COMPARISON:  Chest x-ray 05/30/2016. FINDINGS: Lung volumes are normal. No consolidative airspace disease. No pleural effusions. No pneumothorax. No pulmonary nodule or mass noted. Pulmonary vasculature and the cardiomediastinal silhouette are within normal limits. IMPRESSION: No radiographic evidence of acute cardiopulmonary disease. Electronically Signed   By: Trudie Reedaniel  Entrikin M.D.   On: 08/31/2020 17:40   DG Shoulder Left  Result Date: 08/31/2020 CLINICAL DATA:  Pain after unwitnessed fall. EXAM: LEFT SHOULDER - 2+ VIEW COMPARISON:  None. FINDINGS: Portable AP and oblique images of the shoulder obtained. Limited assessment of glenohumeral alignment. Humeral head appears centered on the glenoid on the oblique view, however appears slightly anteriorly subluxed on AP view. No acute fracture. Degenerative change of the humeral head and acromioclavicular joint. IMPRESSION: Limited assessment of glenohumeral alignment. This appears slightly anterior subluxed on the AP view, but humeral head centered in the glenoid on oblique. Recommend axillary view if patient is able. No acute fracture. Electronically Signed   By: Narda RutherfordMelanie  Sanford M.D.   On: 08/31/2020 17:47   DG Hip Unilat W or Wo Pelvis 1 View Left  Result Date: 08/31/2020 CLINICAL DATA:  Unwitnessed fall last night. Left hip pain. EXAM: DG HIP (WITH OR WITHOUT PELVIS)  1V*L* COMPARISON:  None. FINDINGS: AP view of the pelvis with AP view of the left hip. The cortical margins of the bony pelvis and left hip are intact. No fracture. Pubic symphysis and sacroiliac joints are congruent. Mild bilateral hip osteoarthritis. Both femoral heads are well-seated in the respective acetabula. IMPRESSION: No pelvic or left hip fracture. Electronically Signed   By: Narda RutherfordMelanie  Sanford M.D.   On: 08/31/2020 17:45    Micro Results     Recent Results (from the past 240 hour(s))  Resp Panel by RT-PCR (Flu A&B, Covid) Nasopharyngeal Swab     Status: Abnormal   Collection Time: 08/31/20 12:55 PM   Specimen: Nasopharyngeal Swab; Nasopharyngeal(NP) swabs in vial transport medium  Result Value Ref Range Status   SARS Coronavirus 2 by RT PCR POSITIVE (A) NEGATIVE Final    Comment: emailed L. Berdik RN 15:15 08/31/20 ([email or web address]) (NOTE) SARS-CoV-2 target nucleic acids are DETECTED.  The SARS-CoV-2 RNA is generally detectable in upper respiratory specimens during the acute phase of infection. Positive results are indicative of the presence of the identified virus, but do not rule out bacterial infection or co-infection with other pathogens not detected by the test. Clinical correlation with patient history and other diagnostic information is necessary to determine patient infection status. The expected result is Negative.  Fact Sheet for Patients: BloggerCourse.comhttps://www.fda.gov/media/152166/download  Fact Sheet for Healthcare Providers: SeriousBroker.ithttps://www.fda.gov/media/152162/download  This test is not yet approved or cleared by the [place] FDA and  has been authorized for detection and/or diagnosis of SARS-CoV-2 by FDA under an Emergency Use Authorization (EUA).  This EUA will remain in effect (meaning this test can be used) for the duration of  the COVID- 19 declaration under Section 564(b)(1) of the Act, 21 U.S.C. section 360bbb-3(b)(1), unless the authorization is terminated or revoked  sooner.     Influenza A by PCR NEGATIVE NEGATIVE Final   Influenza B by PCR NEGATIVE NEGATIVE Final    Comment: (NOTE) The Xpert Xpress SARS-CoV-2/FLU/RSV plus assay is intended as an aid in the diagnosis of influenza from Nasopharyngeal swab specimens and should  not be used as a sole basis for treatment. Nasal washings and aspirates are unacceptable for Xpert Xpress SARS-CoV-2/FLU/RSV testing.  Fact Sheet for Patients: BloggerCourse.com  Fact Sheet for Healthcare Providers: SeriousBroker.it  This test is not yet approved or cleared by the [place] FDA and has been authorized for detection and/or diagnosis of SARS-CoV-2 by FDA under an Emergency Use Authorization (EUA). This EUA will remain in effect (meaning this test can be used) for the duration of the COVID-19 declaration under Section 564(b)(1) of the Act, 21 U.S.C. section 360bbb-3(b)(1), unless the authorization is terminated or revoked.  Performed at [facility], [address] [place], [place] [address]     Today   Subjective    Amy Andrade today has no headache,no chest abdominal pain,no new weakness tingling or numbness, feels much better     Objective   Blood pressure (!) 142/73, pulse 60, temperature 98.1 F (36.7 C), temperature source Oral, resp. rate 18, height  (1.727 m), weight 81.5 kg, SpO2 95 %.  No intake or output data in the 24 hours ending 09/05/20 1125  Exam  Awake but mildly confused, No new F.N deficits,   [place].AT,PERRAL Supple Neck,No JVD, No cervical lymphadenopathy appriciated.  Symmetrical Chest wall movement, Good air movement bilaterally, CTAB RRR,No Gallops,Rubs or new Murmurs, No Parasternal Heave +ve B.Sounds, Abd Soft, Non tender, No organomegaly appriciated, No rebound -guarding or rigidity. No Cyanosis, left knee immobilizer in place   Data Review   CBC w Diff:  Lab Results  Component Value Date   WBC  3.8 (L) 09/04/2020   HGB 13.8 09/04/2020   HGB 14.2 04/19/2015   HCT 40.6 09/04/2020   HCT 41.5 04/19/2015   PLT 211 09/04/2020   PLT 231 04/19/2015   LYMPHOPCT 30 09/04/2020   MONOPCT 11 09/04/2020   EOSPCT 1 09/04/2020   BASOPCT 0 09/04/2020    CMP:  Lab Results  Component Value Date   NA 132 (L) 09/04/2020   NA 136 04/19/2015   K 3.7 09/04/2020   CL 96 (L) 09/04/2020   CO2 24 09/04/2020   BUN 8 09/04/2020   BUN 17 04/19/2015   CREATININE 0.72 09/04/2020   CREATININE 0.79 05/19/2020   PROT 6.8 09/04/2020   ALBUMIN 3.4 (L) 09/04/2020   BILITOT 1.0 09/04/2020   ALKPHOS 54 09/04/2020   AST 30 09/04/2020   ALT 20 09/04/2020  .   Total Time in preparing paper work, data evaluation and todays exam - 35 minutes  Susa Raring M.D on 09/05/2020 at 11:25 AM  [facility]

## 2020-09-05 NOTE — TOC Progression Note (Addendum)
Transition of Care [facility]) - Progression Note    Patient Details  Name: Amy Andrade MRN: [id number] Date of Birth: 1936/01/28  Transition of Care [facility] CM/SW Contact  Mearl Latin, LCSW Phone Number: 09/05/2020, 8:54 AM  Clinical Narrative:    8:54am-CSW contacted [employer] to check the status of insurance approval. Case is still under review with their MD due to patient's Dementia diagnosis.   9:20am-CSW received call with insurance approval: #[id number], effective 12/20-12/22/21. Awaiting confirmation of bed from [facility]. CSW updated patient's son.   Expected Discharge Plan: Skilled Nursing Facility Barriers to Discharge: Insurance Authorization,SNF Pending bed offer,Continued Medical Work up  Expected Discharge Plan and Services Expected Discharge Plan: Skilled Nursing Facility In-house Referral: Clinical Social Work Discharge Planning Services: CM Consult Post Acute Care Choice: Skilled Nursing Facility Living arrangements for the past 2 months: Single Family Home Expected Discharge Date: 09/05/20                                     Social Determinants of Health (SDOH) Interventions    Readmission Risk Interventions No flowsheet data found.

## 2020-09-05 NOTE — Discharge Instructions (Signed)
Follow with Primary MD Renato Gails, Tiffany L, DO in 7 days   Get CBC, CMP, 2 view Chest X ray -  checked next visit within 1 week by Primary MD or SNF MD    Activity: Toe-touch weightbearing on left leg, wear the left knee brace at all times.  Follow with orthopedics in 2 weeks.  Disposition Home    Diet: Heart Healthy with feeding assistance and aspiration precautions.   Special Instructions: If you have smoked or chewed Tobacco  in the last 2 yrs please stop smoking, stop any regular Alcohol  and or any Recreational drug use.  On your next visit with your primary care physician please Get Medicines reviewed and adjusted.  Please request your Prim.MD to go over all Hospital Tests and Procedure/Radiological results at the follow up, please get all Hospital records sent to your Prim MD by signing hospital release before you go home.  If you experience worsening of your admission symptoms, develop shortness of breath, life threatening emergency, suicidal or homicidal thoughts you must seek medical attention immediately by calling 911 or calling your MD immediately  if symptoms less severe.  You Must read complete instructions/literature along with all the possible adverse reactions/side effects for all the Medicines you take and that have been prescribed to you. Take any new Medicines after you have completely understood and accpet all the possible adverse reactions/side effects.

## 2020-09-05 NOTE — Progress Notes (Signed)
Pt prepared for d/c to SNF. IV d/c'd. Skin intact except as charted in most recent assessments. Vitals are stable. Report called to receiving facility. Pt to be transported by ambulance service. 

## 2020-09-06 ENCOUNTER — Encounter: Payer: Self-pay | Admitting: Adult Health

## 2020-09-06 ENCOUNTER — Non-Acute Institutional Stay (SKILLED_NURSING_FACILITY): Payer: Medicare Other | Admitting: Adult Health

## 2020-09-06 DIAGNOSIS — I10 Essential (primary) hypertension: Secondary | ICD-10-CM

## 2020-09-06 DIAGNOSIS — U071 COVID-19: Secondary | ICD-10-CM | POA: Diagnosis not present

## 2020-09-06 DIAGNOSIS — S72472S Torus fracture of lower end of left femur, sequela: Secondary | ICD-10-CM

## 2020-09-06 DIAGNOSIS — J4520 Mild intermittent asthma, uncomplicated: Secondary | ICD-10-CM

## 2020-09-06 DIAGNOSIS — F0280 Dementia in other diseases classified elsewhere without behavioral disturbance: Secondary | ICD-10-CM

## 2020-09-06 DIAGNOSIS — E785 Hyperlipidemia, unspecified: Secondary | ICD-10-CM

## 2020-09-06 DIAGNOSIS — G309 Alzheimer's disease, unspecified: Secondary | ICD-10-CM

## 2020-09-06 NOTE — Progress Notes (Signed)
Location:  [facility] Room Number: 304-B Place of Service:  SNF (31) Provider:  Kenard Gower, DNP, FNP-BC  Patient Care Team: Kermit Balo, DO as PCP - General (Geriatric Medicine) Chalmers Guest, MD as Consulting Physician (Ophthalmology)  Extended Emergency Contact Information Primary Emergency Contact: Hrdlicka,[name] Address: [address] [place], [place] [address] Darden [place] of [place] Home Phone: [phone] Relation: Other Secondary Emergency Contact: Iribe,[name] Address: [address]          [place], [place] [address] D[place] of [place] Home Phone: [phone] Relation: Son  Code Status:  FULL CODE  Goals of care: Advanced Directive information Advanced Directives 08/31/2020  Does Patient Have a Medical Advance Directive? No  Type of Advance Directive -  Does patient want to make changes to medical advance directive? -  Copy of Healthcare Power of Attorney in Chart? -  Would patient like information on creating a medical advance directive? No - Patient declined     Chief Complaint  Patient presents with  . Acute Visit    HPI:  Pt is an 84 y.o. Andrade seen today for hospital follow-up.  She was admitted to [facility] on 09/05/20 post [facility] hospitalization 08/31/20 to 09/05/20 for left femur fracture sustained from a fall at home.  Nonoperative management was recommended by orthopedics.  She will remain in knee immobilizer and TTWB.  She had an incidental COVID-19 infection.  She is asymptomatic of COVID-19 and was given monoclonal antibody infusion on 08/31/2020.   Past Medical History:  Diagnosis Date  . Alzheimer disease (HCC)   . Asthma   . Hyperlipidemia   . Hypertension   . Pulmonary embolism (HCC) 2004   was short of breath--was on coumadin for 6 mos to a year   Past Surgical History:  Procedure Laterality Date  . ABDOMINAL HYSTERECTOMY    . BUNIONECTOMY     . REPLACEMENT TOTAL KNEE  1980s, 2003   x2    No Known Allergies  Outpatient Encounter Medications as of 09/06/2020  Medication Sig  . acetaminophen (TYLENOL) 500 MG tablet Take 500 mg by mouth every 6 (six) hours as needed for fever (shoulder pain).  Marland Kitchen amLODipine (NORVASC) 10 MG tablet TAKE 1 TABLET(10 MG) BY MOUTH DAILY  . aspirin 81 MG tablet Take 81 mg by mouth daily.  . budesonide-formoterol (SYMBICORT) 160-4.5 MCG/ACT inhaler Inhale 2 puffs into the lungs 2 (two) times daily. Symbicort 120/4.5 1 puff 2 x a day (Patient taking differently: Inhale 2 puffs into the lungs 2 (two) times daily. Symbicort 120/4.5 1 puff 2 x a day)  . enoxaparin (LOVENOX) 40 MG/0.4ML injection Inject 0.4 mLs (40 mg total) into the skin daily for 14 days.  . famotidine (PEPCID) 20 MG tablet Take 1 tablet (20 mg total) by mouth daily.  Marland Kitchen HYDROcodone-acetaminophen (NORCO/VICODIN) 5-325 MG tablet Take 1 tablet by mouth every 12 (twelve) hours as needed for severe pain.  Marland Kitchen irbesartan (AVAPRO) 300 MG tablet TAKE 1 TABLET(300 MG) BY MOUTH DAILY (Patient taking differently: Take 300 mg by mouth daily.)  . nitroGLYCERIN (NITROSTAT) 0.4 MG SL tablet Place 1 tablet (0.4 mg total) under the tongue every 5 (five) minutes x 3 doses as needed for chest pain.  . pravastatin (PRAVACHOL) 40 MG tablet TAKE 1 TABLET(40 MG) BY MOUTH DAILY (Patient taking differently: Take 40 mg by mouth daily.)  No facility-administered encounter medications on file as of 09/06/2020.    Review of Systems  GENERAL: No change in appetite, no fatigue, no weight changes, no fever, chills or weakness MOUTH and THROAT: Denies oral discomfort, gingival pain or bleeding RESPIRATORY: no cough, SOB, DOE, wheezing, hemoptysis CARDIAC: No chest pain, edema or palpitations GI: No abdominal pain, diarrhea, constipation, heart burn, nausea or vomiting NEUROLOGICAL: Denies dizziness, syncope, numbness, or headache PSYCHIATRIC: Denies feelings of  depression or anxiety. No report of hallucinations, insomnia, paranoia, or agitation   Immunization History  Administered Date(s) Administered  . Influenza, High Dose Seasonal PF 10/27/2018  . Influenza,inj,Quad PF,6+ Mos 05/27/2015, 06/18/2016  . Influenza-Unspecified 05/18/2017  . Pneumococcal Conjugate-13 10/11/2014  . Pneumococcal Polysaccharide-23 01/20/2016  . Tdap 09/17/2010  . Zoster 07/22/2014   Pertinent  Health Maintenance Due  Topic Date Due  . INFLUENZA VACCINE  04/17/2020  . DEXA SCAN  Completed  . PNA vac Low Risk Adult  Completed   Fall Risk  04/25/2020 12/09/2019 11/23/2019 04/28/2019 10/27/2018  Falls in the past year? 0 0 0 0 0  Number falls in past yr: 0 0 0 0 0  Injury with Fall? 0 0 0 0 0     Vitals:   09/06/20 1302  BP: 126/70  Pulse: 69  Resp: 20  Temp: (!) 97.5 F (36.4 C)  TempSrc: Oral  SpO2: 96%  Weight: 173 lb 4.8 oz (78.6 kg)  Height: 5\' 8"  (1.727 m)   Body mass index is 26.35 kg/m.  Physical Exam  GENERAL APPEARANCE: Well nourished. In no acute distress. Normal body habitus SKIN:  Bruise on right hand (sustained from the fall at home) MOUTH and THROAT: Lips are without lesions. Oral mucosa is moist and without lesions. Tongue is normal in shape, size, and color and without lesions RESPIRATORY: Breathing is even & unlabored, BS CTAB CARDIAC: RRR, no murmur,no extra heart sounds, no edema GI: Abdomen soft, normal BS, no masses, no tenderness NEUROLOGICAL: There is no tremor. Speech is clear. Alert to self, disoriented to time and place. PSYCHIATRIC:  Affect and behavior are appropriate  Labs reviewed: Recent Labs    09/02/20 0212 09/03/20 0301 09/04/20 0443  NA 127* 130* 132*  K 3.9 4.1 3.7  CL 93* 97* 96*  CO2 21* 23 24  GLUCOSE 105* 107* 120*  BUN 9 13 8   CREATININE 0.77 0.83 0.72  CALCIUM 8.6* 8.7* 9.0  MG  --   --  1.7   Recent Labs    08/31/20 1412 09/03/20 0301 09/04/20 0443  AST 44* 29 30  ALT 23 18 20   ALKPHOS 64  44 54  BILITOT 1.0 0.8 1.0  PROT 6.9 5.7* 6.8  ALBUMIN 3.8 3.0* 3.4*   Recent Labs    05/03/20 1014 08/31/20 1412 09/01/20 0638 09/02/20 0212 09/03/20 0301 09/04/20 0443  WBC 4.2 4.3   < > 3.4* 2.6* 3.8*  NEUTROABS 1,961 3.2  --   --   --  2.3  HGB 13.7 13.8   < > 12.9 12.2 13.8  HCT 40.8 39.6   < > 35.4* 34.7* 40.6  MCV 98.8 95.2   < > 92.4 94.8 95.3  PLT 207 209   < > 179 171 211   < > = values in this interval not displayed.   Lab Results  Component Value Date   TSH 1.460 09/02/2020   Lab Results  Component Value Date   HGBA1C 6.4 (H) 04/19/2015   Lab Results  Component  Value Date   CHOL 192 05/03/2020   HDL 102 05/03/2020   LDLCALC 76 05/03/2020   TRIG 48 05/03/2020   CHOLHDL 1.9 05/03/2020    Significant Diagnostic Results in last 30 days:  DG Wrist Complete Left  Result Date: 08/31/2020 CLINICAL DATA:  Unwitnessed fall, pain. EXAM: LEFT WRIST - COMPLETE 3+ VIEW COMPARISON:  None. FINDINGS: No acute fracture. There is mild widening of the scapholunate interval of unknown acuity. Advanced osteoarthritis at the thumb carpal metacarpal joint. Mild radiocarpal osteoarthritis. Osteoarthritis of the metacarpal phalangeal joints most prominently involving the middle finger. There is chondrocalcinosis of the triangular fibrocartilage. There is no focal soft tissue abnormality. IMPRESSION: 1. No acute fracture of the left wrist. 2. Widening of the scapholunate interval of unknown acuity. 3. Multifocal osteoarthritis. Chondrocalcinosis of the triangular fibrocartilage. Electronically Signed   By: Narda Rutherford M.D.   On: 08/31/2020 17:48   DG Knee 2 Views Left  Result Date: 08/31/2020 CLINICAL DATA:  Recent fall with leg pain, initial encounter EXAM: LEFT KNEE - 1-2 VIEW COMPARISON:  None. FINDINGS: Left knee prosthesis is noted in satisfactory position. There is a fracture in the medial aspect of the distal femoral metaphysis with mild displacement identified.  Significant joint effusion is seen. No other focal abnormality is noted. IMPRESSION: Undisplaced distal left femoral fracture. Electronically Signed   By: Alcide Clever M.D.   On: 08/31/2020 13:15   CT Head Wo Contrast  Result Date: 08/31/2020 CLINICAL DATA:  Fall, head trauma. EXAM: CT HEAD WITHOUT CONTRAST TECHNIQUE: Contiguous axial images were obtained from the base of the skull through the vertex without intravenous contrast. COMPARISON:  None. FINDINGS: Brain: No hemorrhage. No extra-axial or subdural collection. There is equivocal loss of gray-white differentiation in the right occipital lobe versus artifact. Generalized atrophy moderate to advanced periventricular and deep white matter chronic small vessel ischemia. Bilateral basal gangliar mineralization is typically senescent. No hydrocephalus. Vascular: Atherosclerosis of skullbase vasculature without hyperdense vessel or abnormal calcification. Skull: No fracture or focal lesion. Sinuses/Orbits: Paranasal sinuses and mastoid air cells are clear. The visualized orbits are unremarkable. Frontal sinuses are hypo pneumatized. Other: None. IMPRESSION: 1. Equivocal loss of gray-white differentiation in the right occipital lobe versus artifact. Recommend correlation with neurologic symptoms. If there is clinical concern for acute infarct, recommend MRI for further evaluation. 2. No hemorrhage. 3. Generalized atrophy and chronic small vessel ischemia. Electronically Signed   By: Narda Rutherford M.D.   On: 08/31/2020 18:00   DG Chest Port 1 View  Result Date: 08/31/2020 CLINICAL DATA:  85 year old Andrade with history of COVID-19. EXAM: PORTABLE CHEST 1 VIEW COMPARISON:  Chest x-ray 05/30/2016. FINDINGS: Lung volumes are normal. No consolidative airspace disease. No pleural effusions. No pneumothorax. No pulmonary nodule or mass noted. Pulmonary vasculature and the cardiomediastinal silhouette are within normal limits. IMPRESSION: No radiographic  evidence of acute cardiopulmonary disease. Electronically Signed   By: Trudie Reed M.D.   On: 08/31/2020 17:40   DG Shoulder Left  Result Date: 08/31/2020 CLINICAL DATA:  Pain after unwitnessed fall. EXAM: LEFT SHOULDER - 2+ VIEW COMPARISON:  None. FINDINGS: Portable AP and oblique images of the shoulder obtained. Limited assessment of glenohumeral alignment. Humeral head appears centered on the glenoid on the oblique view, however appears slightly anteriorly subluxed on AP view. No acute fracture. Degenerative change of the humeral head and acromioclavicular joint. IMPRESSION: Limited assessment of glenohumeral alignment. This appears slightly anterior subluxed on the AP view, but humeral head centered in the  glenoid on oblique. Recommend axillary view if patient is able. No acute fracture. Electronically Signed   By: Narda RutherfordMelanie  Sanford M.D.   On: 08/31/2020 17:47   DG Hip Unilat W or Wo Pelvis 1 View Left  Result Date: 08/31/2020 CLINICAL DATA:  Unwitnessed fall last night. Left hip pain. EXAM: DG HIP (WITH OR WITHOUT PELVIS) 1V*L* COMPARISON:  None. FINDINGS: AP view of the pelvis with AP view of the left hip. The cortical margins of the bony pelvis and left hip are intact. No fracture. Pubic symphysis and sacroiliac joints are congruent. Mild bilateral hip osteoarthritis. Both femoral heads are well-seated in the respective acetabula. IMPRESSION: No pelvic or left hip fracture. Electronically Signed   By: Narda RutherfordMelanie  Sanford M.D.   On: 08/31/2020 17:45    Assessment/Plan  1. Closed torus fracture of distal end of left femur, sequela -   Nonoperative management was recommended by orthopedics, will remain in  knee immobilizer and TTWB,  follow up with orthopedics in 3 weeks -   Continue Lovenox x2 weeks for DVT prophylaxis, PRN Norco  2. COVID-19 virus infection -  S/P monoclonal antibody 08/31/2020, asymptomatic .-Currently COVID-19 isolation unit  3. Essential hypertension -Continue to  irbesartan and amlodipine  4. Hyperlipidemia, unspecified hyperlipidemia type Lab Results  Component Value Date   CHOL 192 05/03/2020   HDL 102 05/03/2020   LDLCALC 76 05/03/2020   TRIG 48 05/03/2020   CHOLHDL 1.9 05/03/2020   -Continue pravastatin  5. Mild intermittent asthma, unspecified whether complicated -  No wheezing, continue budesonide-formoterol inhaler  6. Alzheimer disease (HCC) -Continue supportive care and fall precautions     Family/ staff Communication:  Discussed plan of care with resident and charge nurse.  Labs/tests ordered:  None  Goals of care:  Short-term care   Kenard GowerMonina Medina-Vargas, DNP, MSN, FNP-BC [facility] [phone] (Monday-Friday 8:00 a.m. - 5:00 p.m.) [phone] (after hours)

## 2020-09-08 ENCOUNTER — Encounter: Payer: Self-pay | Admitting: Internal Medicine

## 2020-09-08 ENCOUNTER — Non-Acute Institutional Stay (SKILLED_NURSING_FACILITY): Payer: Medicare Other | Admitting: Internal Medicine

## 2020-09-08 DIAGNOSIS — R29818 Other symptoms and signs involving the nervous system: Secondary | ICD-10-CM | POA: Diagnosis not present

## 2020-09-08 DIAGNOSIS — Z86711 Personal history of pulmonary embolism: Secondary | ICD-10-CM

## 2020-09-08 DIAGNOSIS — I10 Essential (primary) hypertension: Secondary | ICD-10-CM

## 2020-09-08 DIAGNOSIS — B342 Coronavirus infection, unspecified: Secondary | ICD-10-CM

## 2020-09-08 DIAGNOSIS — R4189 Other symptoms and signs involving cognitive functions and awareness: Secondary | ICD-10-CM

## 2020-09-08 DIAGNOSIS — S72402A Unspecified fracture of lower end of left femur, initial encounter for closed fracture: Secondary | ICD-10-CM

## 2020-09-08 DIAGNOSIS — E441 Mild protein-calorie malnutrition: Secondary | ICD-10-CM

## 2020-09-08 DIAGNOSIS — D72819 Decreased white blood cell count, unspecified: Secondary | ICD-10-CM

## 2020-09-08 DIAGNOSIS — E46 Unspecified protein-calorie malnutrition: Secondary | ICD-10-CM | POA: Insufficient documentation

## 2020-09-08 NOTE — Assessment & Plan Note (Addendum)
S/P monoclonal antibody infusion 12/15. Clinically asymptomatic

## 2020-09-08 NOTE — Assessment & Plan Note (Addendum)
09/08/2020 patient can provide no meaningful history. She had no idea why she had been in the hospital. She is unaware of that she has a left distal femur fracture or had Covid 19 + screen. MMSE indicated to establish baseline and outpatient needs post discharge from the SNF.

## 2020-09-08 NOTE — Assessment & Plan Note (Signed)
D-dimer varied from a low of 1.37 up to 2.08.  Prophylactic Lovenox was to be continued for 2 weeks post discharge.

## 2020-09-08 NOTE — Progress Notes (Signed)
NURSING HOME LOCATION:  [facility] ROOM NUMBER:  [id number]  CODE STATUS:  FULL CODE  PCP:  Renato Gails, Tiffany L, DO  [address] [place] [address]  This is a comprehensive admission note to [facility] performed on this date less than 30 days from date of admission. Included are preadmission medical/surgical history; reconciled medication list; family history; social history and comprehensive review of systems.  Corrections and additions to the records were documented. Comprehensive physical exam was also performed. Additionally a clinical summary was entered for each active diagnosis pertinent to this admission in the Problem List to enhance continuity of care.  HPI: She was hospitalized 12/15-12/20/2021 with nondisplaced left distal femur fracture apparently sustained in a mechanical fall. Dr Sherlean Foot, Orthopedics, recommended nonoperative management; she was to remain in a knee immobilizer with TTWB with orthopedic follow-up in 3 weeks.  Incidental finding was positive COVID-19 infection.  The patient was unvaccinated.  Chest x-ray revealed no Covid pneumonia.  She received monoclonal antibodies on 12/15. Because of history of PTE/VTE prophylactic Lovenox was to be continued for 2 weeks. Course was complicated by hospital-acquired delirium with metabolic encephalopathy with possible underlying early dementia.  Family reported the patient would exhibit delirium in the evening hours PTA.  Narcotics and benzodiazepines were to be minimized as the delirium was clinically mild. She was D/Ced to SNF for PT/OT.  Past medical and surgical history: Include essential hypertension, dyslipidemia, history of asthma, history of PE/VTE, and neurocognitive deficit. Surgeries and procedures include abdominal hysterectomy and TKR remotely.  Social history: Former history of possible "uncomplicated alcohol dependence" as per Problem List 04/25/2020.  Significant smoking history. 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  Family history: Extensive history reviewed; it is noncontributory due to her advanced age.   Review of systems:  Could not be completed due to dementia.  She cannot tell me why she was in the hospital and was unaware that she had sustained a femur fracture. She did not remember she had sustained a fracture when I asked her what it happened few minutes later after informing her of fracture & need for assistance to ambulate. She also was unaware of Covid screening results. When I asked her what it was, she stated "some sort of infection". At this time she denies any left extremity pain. Her main concern is the need to ambulate to the bathroom.  Her CNA states the patient keeps taking off her gown and removing the left leg brace.  Physical exam:  Pertinent or positive findings: Speech & vocabulary suggest she is well educated in spite of memory deficits.She is somewhat upset as noted above. She has dense arcus senilis. She has fine hirsutism of the chin. Pedal pulses are decreased. Feet are cool w/o ischemic change. She has flexion contractures of the toes and deformities of the toenails, especially the large toenails. She has isolated PIP enlargement in the hands.  General appearance: Adequately nourished; no acute distress, increased work of breathing is present.   Lymphatic: No lymphadenopathy about the head, neck, axilla. Eyes: No conjunctival inflammation or lid edema is present. There is no scleral icterus. Ears:  External ear exam shows no significant lesions or deformities.   Nose:  External nasal examination shows no deformity or inflammation. Nasal mucosa are pink and moist without lesions, exudates Oral exam: Lips and gums are healthy appearing.There is no oropharyngeal erythema or exudate. Neck:  No thyromegaly, masses, tenderness noted.  Heart:  Normal rate and regular rhythm. S1 and S2 normal without gallop, murmur,  click, rub.  Lungs: Chest clear to auscultation without wheezes, rhonchi, rales, rubs. Abdomen: Bowel sounds are normal.  Abdomen is soft and nontender with no organomegaly, hernias, masses. GU: Deferred  Extremities:  No cyanosis, clubbing, edema. Neurologic exam:  Balance, Rhomberg, finger to nose testing could not be completed due to clinical state Skin: Warm & dry w/o tenting. No significant lesions or rash.  See clinical summary under each active problem in the Problem List with associated updated therapeutic plan

## 2020-09-08 NOTE — Assessment & Plan Note (Addendum)
Current blood pressure was 179/87.  This does not appear to be an outlier. This is in the context of severe neurocognitive deficit with agitation. Antihypertensive medications will be adjusted.

## 2020-09-08 NOTE — Patient Instructions (Signed)
See assessment and plan under each diagnosis in the problem list and acutely for this visit 

## 2020-09-08 NOTE — Assessment & Plan Note (Signed)
Nutrition consult at SNF 

## 2020-09-08 NOTE — Assessment & Plan Note (Signed)
In the context of asymptomatic COVID-19 infection.  White count at discharge 2.6 with normal differential.  Monitor for any opportunistic infections.

## 2020-09-11 ENCOUNTER — Other Ambulatory Visit: Payer: Self-pay | Admitting: Adult Health

## 2020-09-11 MED ORDER — HYDROCODONE-ACETAMINOPHEN 5-325 MG PO TABS
1.0000 | ORAL_TABLET | Freq: Two times a day (BID) | ORAL | 0 refills | Status: DC | PRN
Start: 2020-09-11 — End: 2020-09-11

## 2020-09-11 MED ORDER — HYDROCODONE-ACETAMINOPHEN 5-325 MG PO TABS: 1.0000 | ORAL_TABLET | Freq: Two times a day (BID) | ORAL | 0 refills | Status: DC | PRN

## 2020-09-12 DIAGNOSIS — R059 Cough, unspecified: Secondary | ICD-10-CM | POA: Diagnosis not present

## 2020-09-12 LAB — COMPREHENSIVE METABOLIC PANEL
Albumin: 4 (ref 3.5–5.0)
Calcium: 9.5 (ref 8.7–10.7)
GFR calc Af Amer: 90
GFR calc non Af Amer: 81.61
Globulin: 2.5

## 2020-09-12 LAB — BASIC METABOLIC PANEL
BUN: 16 (ref 4–21)
CO2: 22 (ref 13–22)
Chloride: 98 — AB (ref 99–108)
Creatinine: 0.7 (ref 0.5–1.1)
Glucose: 114
Potassium: 4.8 (ref 3.4–5.3)
Sodium: 134 — AB (ref 137–147)

## 2020-09-12 LAB — HEPATIC FUNCTION PANEL
ALT: 16 (ref 7–35)
AST: 17 (ref 13–35)
Alkaline Phosphatase: 92 (ref 25–125)
Bilirubin, Total: 0.6

## 2020-09-12 LAB — CBC: RBC: 4.25 (ref 3.87–5.11)

## 2020-09-12 LAB — CBC AND DIFFERENTIAL
HCT: 40 (ref 36–46)
Hemoglobin: 13.6 (ref 12.0–16.0)
Neutrophils Absolute: 3.5
Platelets: 404 — AB (ref 150–399)
WBC: 5.5

## 2020-09-19 ENCOUNTER — Encounter: Payer: Self-pay | Admitting: Adult Health

## 2020-09-19 ENCOUNTER — Non-Acute Institutional Stay (SKILLED_NURSING_FACILITY): Payer: Medicare Other | Admitting: Adult Health

## 2020-09-19 DIAGNOSIS — S72472S Torus fracture of lower end of left femur, sequela: Secondary | ICD-10-CM

## 2020-09-19 DIAGNOSIS — I10 Essential (primary) hypertension: Secondary | ICD-10-CM

## 2020-09-19 DIAGNOSIS — B342 Coronavirus infection, unspecified: Secondary | ICD-10-CM | POA: Diagnosis not present

## 2020-09-19 DIAGNOSIS — F5102 Adjustment insomnia: Secondary | ICD-10-CM | POA: Diagnosis not present

## 2020-09-19 NOTE — Progress Notes (Signed)
Location:  [facility] Room Number: 304 B Place of Service:  SNF (31) Provider:  Kenard Gower, DNP, FNP-BC  Patient Care Team: Kermit Balo, DO as PCP - General (Geriatric Medicine) Chalmers Guest, MD as Consulting Physician (Ophthalmology)  Extended Emergency Contact Information Primary Emergency Contact: Hanigan,[name] Address: [address] [place], [place] [address] D[place] of [place] Home Phone: [phone] Relation: Other Secondary Emergency Contact: Gelber,[name] Address: [address]          [place], [place] [address] [place] of [place] Home Phone: [phone] Relation: Son  Code Status:  Full Code  Goals of care: Advanced Directive information Advanced Directives 08/31/2020  Does Patient Have a Medical Advance Directive? No  Type of Advance Directive -  Does patient want to make changes to medical advance directive? -  Copy of Healthcare Power of Attorney in Chart? -  Would patient like information on creating a medical advance directive? No - Patient declined     Chief Complaint  Patient presents with  . Acute Visit    Not sleeping    HPI:  Pt is a 85 y.o. female seen today for medical management of chronic diseases.  She is a short-term care resident of [facility].  She has a PMH of Alzheimer's disease, asthma, hyperlipidemia, PE and hypertension. Staff reported that resident gets restless at night due to inability to sleep. She was seen in the lounge area. She will transfer out of the COVID-19 isolation unit. She has completed her isolation days. No reported coughing, SOB nor fever. She was admitted to [facility] on 09/05/20 post [facility] hospitalization 08/31/2020 to 09/05/2020 for left femur fracture sustained from a fall at home.  Nonoperative management was recommended by orthopedics. She continues to use LLE immobilizer. She is currently having  PT and OT.   Past Medical History:  Diagnosis Date  . Alzheimer disease (HCC)   . Asthma   . Hyperlipidemia   . Hypertension   . Pulmonary embolism (HCC) 2004   was short of breath--was on coumadin for 6 mos to a year   Past Surgical History:  Procedure Laterality Date  . ABDOMINAL HYSTERECTOMY    . BUNIONECTOMY    . REPLACEMENT TOTAL KNEE  1980s, 2003   x2    No Known Allergies  Outpatient Encounter Medications as of 09/19/2020  Medication Sig  . acetaminophen (TYLENOL) 500 MG tablet Take 500 mg by mouth every 6 (six) hours as needed for fever (shoulder pain).  Marland Kitchen amLODipine (NORVASC) 10 MG tablet TAKE 1 TABLET(10 MG) BY MOUTH DAILY  . aspirin 81 MG tablet Take 81 mg by mouth daily.  . bisacodyl (DULCOLAX) 10 MG suppository Place 10 mg rectally as needed for moderate constipation.  . budesonide-formoterol (SYMBICORT) 160-4.5 MCG/ACT inhaler Inhale 2 puffs into the lungs 2 (two) times daily.  . famotidine (PEPCID) 20 MG tablet Take 1 tablet (20 mg total) by mouth daily.  Marland Kitchen HYDROcodone-acetaminophen (NORCO/VICODIN) 5-325 MG tablet Take 1 tablet by mouth every 12 (twelve) hours as needed for severe pain.  Marland Kitchen irbesartan (AVAPRO) 300 MG tablet TAKE 1 TABLET(300 MG) BY MOUTH DAILY  . magnesium hydroxide (MILK OF MAGNESIA) 400 MG/5ML suspension Take 30 mLs by mouth daily as needed for mild constipation.  . metoprolol tartrate (LOPRESSOR) 25 MG tablet Take 25 mg by mouth 2 (two) times daily.  Marland Kitchen  nitroGLYCERIN (NITROSTAT) 0.4 MG SL tablet Place 1 tablet (0.4 mg total) under the tongue every 5 (five) minutes x 3 doses as needed for chest pain.  . pravastatin (PRAVACHOL) 40 MG tablet TAKE 1 TABLET(40 MG) BY MOUTH DAILY  . [DISCONTINUED] enoxaparin (LOVENOX) 40 MG/0.4ML injection Inject 0.4 mLs (40 mg total) into the skin daily for 14 days.  . [DISCONTINUED] nystatin (NYSTATIN) powder Apply 1 application topically 4 (four) times daily. Place underneath left breast x2 weeks for yeast   No  facility-administered encounter medications on file as of 09/19/2020.    Review of Systems  GENERAL: No change in appetite, no fatigue, no weight changes, no fever, chills or weakness MOUTH and THROAT: Denies oral discomfort, gingival pain or bleeding RESPIRATORY: no cough, SOB, DOE, wheezing, hemoptysis CARDIAC: No chest pain or palpitations NEUROLOGICAL: Denies dizziness, syncope, numbness, or headache PSYCHIATRIC: +insomnia   Immunization History  Administered Date(s) Administered  . Influenza, High Dose Seasonal PF 10/27/2018  . Influenza,inj,Quad PF,6+ Mos 05/27/2015, 06/18/2016  . Influenza-Unspecified 05/18/2017  . Pneumococcal Conjugate-13 10/11/2014  . Pneumococcal Polysaccharide-23 01/20/2016  . Tdap 09/17/2010  . Zoster 07/22/2014   Pertinent  Health Maintenance Due  Topic Date Due  . INFLUENZA VACCINE  04/17/2020  . DEXA SCAN  Completed  . PNA vac Low Risk Adult  Completed   Fall Risk  04/25/2020 12/09/2019 11/23/2019 04/28/2019 10/27/2018  Falls in the past year? 0 0 0 0 0  Number falls in past yr: 0 0 0 0 0  Injury with Fall? 0 0 0 0 0     Vitals:   09/19/20 1116  BP: 110/68  Pulse: 70  Resp: 20  Temp: 98.1 F (36.7 C)  Weight: 173 lb 4.8 oz (78.6 kg)  Height: 5\' 10"  (1.778 m)   Body mass index is 24.87 kg/m.  Physical Exam  GENERAL APPEARANCE: Well nourished. In no acute distress. Normal body habitus SKIN:  Skin is warm and dry.  MOUTH and THROAT: Lips are without lesions. Oral mucosa is moist and without lesions. Tongue is normal in shape, size, and color and without lesions RESPIRATORY: Breathing is even & unlabored, BS CTAB CARDIAC: RRR, no murmur,no extra heart sounds, no edema GI: Abdomen soft, normal BS, no masses, no tenderness EXTREMITIES: Able to move X  4 extremities. LLE with immobilizer. NEUROLOGICAL: There is no tremor. Speech is clear. Alert to self, disoriented to time and place. PSYCHIATRIC:  Affect and behavior are appropriate  Labs  reviewed: Recent Labs    09/02/20 0212 09/03/20 0301 09/04/20 0443  NA 127* 130* 132*  K 3.9 4.1 3.7  CL 93* 97* 96*  CO2 21* 23 24  GLUCOSE 105* 107* 120*  BUN 9 13 8   CREATININE 0.77 0.83 0.72  CALCIUM 8.6* 8.7* 9.0  MG  --   --  1.7   Recent Labs    08/31/20 1412 09/03/20 0301 09/04/20 0443  AST 44* 29 30  ALT 23 18 20   ALKPHOS 64 44 54  BILITOT 1.0 0.8 1.0  PROT 6.9 5.7* 6.8  ALBUMIN 3.8 3.0* 3.4*   Recent Labs    05/03/20 1014 08/31/20 1412 09/01/20 0638 09/02/20 0212 09/03/20 0301 09/04/20 0443  WBC 4.2 4.3   < > 3.4* 2.6* 3.8*  NEUTROABS 1,961 3.2  --   --   --  2.3  HGB 13.7 13.8   < > 12.9 12.2 13.8  HCT 40.8 39.6   < > 35.4* 34.7* 40.6  MCV 98.8 95.2   < >  92.4 94.8 95.3  PLT 207 209   < > 179 171 211   < > = values in this interval not displayed.   Lab Results  Component Value Date   TSH 1.460 09/02/2020   Lab Results  Component Value Date   HGBA1C 6.4 (H) 04/19/2015   Lab Results  Component Value Date   CHOL 192 05/03/2020   HDL 102 05/03/2020   LDLCALC 76 05/03/2020   TRIG 48 05/03/2020   CHOLHDL 1.9 05/03/2020    Significant Diagnostic Results in last 30 days:  DG Wrist Complete Left  Result Date: 08/31/2020 CLINICAL DATA:  Unwitnessed fall, pain. EXAM: LEFT WRIST - COMPLETE 3+ VIEW COMPARISON:  None. FINDINGS: No acute fracture. There is mild widening of the scapholunate interval of unknown acuity. Advanced osteoarthritis at the thumb carpal metacarpal joint. Mild radiocarpal osteoarthritis. Osteoarthritis of the metacarpal phalangeal joints most prominently involving the middle finger. There is chondrocalcinosis of the triangular fibrocartilage. There is no focal soft tissue abnormality. IMPRESSION: 1. No acute fracture of the left wrist. 2. Widening of the scapholunate interval of unknown acuity. 3. Multifocal osteoarthritis. Chondrocalcinosis of the triangular fibrocartilage. Electronically Signed   By: Keith Rake M.D.   On:  08/31/2020 17:48   DG Knee 2 Views Left  Result Date: 08/31/2020 CLINICAL DATA:  Recent fall with leg pain, initial encounter EXAM: LEFT KNEE - 1-2 VIEW COMPARISON:  None. FINDINGS: Left knee prosthesis is noted in satisfactory position. There is a fracture in the medial aspect of the distal femoral metaphysis with mild displacement identified. Significant joint effusion is seen. No other focal abnormality is noted. IMPRESSION: Undisplaced distal left femoral fracture. Electronically Signed   By: Inez Catalina M.D.   On: 08/31/2020 13:15   CT Head Wo Contrast  Result Date: 08/31/2020 CLINICAL DATA:  Fall, head trauma. EXAM: CT HEAD WITHOUT CONTRAST TECHNIQUE: Contiguous axial images were obtained from the base of the skull through the vertex without intravenous contrast. COMPARISON:  None. FINDINGS: Brain: No hemorrhage. No extra-axial or subdural collection. There is equivocal loss of gray-white differentiation in the right occipital lobe versus artifact. Generalized atrophy moderate to advanced periventricular and deep white matter chronic small vessel ischemia. Bilateral basal gangliar mineralization is typically senescent. No hydrocephalus. Vascular: Atherosclerosis of skullbase vasculature without hyperdense vessel or abnormal calcification. Skull: No fracture or focal lesion. Sinuses/Orbits: Paranasal sinuses and mastoid air cells are clear. The visualized orbits are unremarkable. Frontal sinuses are hypo pneumatized. Other: None. IMPRESSION: 1. Equivocal loss of gray-white differentiation in the right occipital lobe versus artifact. Recommend correlation with neurologic symptoms. If there is clinical concern for acute infarct, recommend MRI for further evaluation. 2. No hemorrhage. 3. Generalized atrophy and chronic small vessel ischemia. Electronically Signed   By: Keith Rake M.D.   On: 08/31/2020 18:00   DG Chest Port 1 View  Result Date: 08/31/2020 CLINICAL DATA:  85 year old female  with history of COVID-19. EXAM: PORTABLE CHEST 1 VIEW COMPARISON:  Chest x-ray 05/30/2016. FINDINGS: Lung volumes are normal. No consolidative airspace disease. No pleural effusions. No pneumothorax. No pulmonary nodule or mass noted. Pulmonary vasculature and the cardiomediastinal silhouette are within normal limits. IMPRESSION: No radiographic evidence of acute cardiopulmonary disease. Electronically Signed   By: Vinnie Langton M.D.   On: 08/31/2020 17:40   DG Shoulder Left  Result Date: 08/31/2020 CLINICAL DATA:  Pain after unwitnessed fall. EXAM: LEFT SHOULDER - 2+ VIEW COMPARISON:  None. FINDINGS: Portable AP and oblique images of the shoulder  obtained. Limited assessment of glenohumeral alignment. Humeral head appears centered on the glenoid on the oblique view, however appears slightly anteriorly subluxed on AP view. No acute fracture. Degenerative change of the humeral head and acromioclavicular joint. IMPRESSION: Limited assessment of glenohumeral alignment. This appears slightly anterior subluxed on the AP view, but humeral head centered in the glenoid on oblique. Recommend axillary view if patient is able. No acute fracture. Electronically Signed   By: Narda Rutherford M.D.   On: 08/31/2020 17:47   DG Hip Unilat W or Wo Pelvis 1 View Left  Result Date: 08/31/2020 CLINICAL DATA:  Unwitnessed fall last night. Left hip pain. EXAM: DG HIP (WITH OR WITHOUT PELVIS) 1V*L* COMPARISON:  None. FINDINGS: AP view of the pelvis with AP view of the left hip. The cortical margins of the bony pelvis and left hip are intact. No fracture. Pubic symphysis and sacroiliac joints are congruent. Mild bilateral hip osteoarthritis. Both femoral heads are well-seated in the respective acetabula. IMPRESSION: No pelvic or left hip fracture. Electronically Signed   By: Narda Rutherford M.D.   On: 08/31/2020 17:45    Assessment/Plan  1. Adjustment insomnia -  Will start on Melatonin 5 mg at bedtime  2. Primary  hypertension -  SBPs ranging from 110 to 155 -Stable, continue metoprolol and amlodipine  3. Closed torus fracture of distal end of left femur, sequela -  Continue TTWB and immobilizer -Follow-up with orthopedics -Completed Lovenox x2 weeks for DVT prophylaxis and currently on aspirin 81 mg daily -Continue PRN Norco for pain  4.  Coronavirus infection -Was treated in the hospital with monoclonal antibody, 08/31/2020 -Completed COVID-19 isolation and will not transfer to a regular room     Family/ staff Communication: Discussed plan of care with resident and charge nurse.  Labs/tests ordered: None  Goals of care:   Short-term care   Kenard Gower, DNP, MSN, FNP-BC [facility] [phone] (Monday-Friday 8:00 a.m. - 5:00 p.m.) [phone] (after hours)

## 2020-09-22 DIAGNOSIS — Z96652 Presence of left artificial knee joint: Secondary | ICD-10-CM | POA: Diagnosis not present

## 2020-09-22 DIAGNOSIS — S72492A Other fracture of lower end of left femur, initial encounter for closed fracture: Secondary | ICD-10-CM | POA: Diagnosis not present

## 2020-09-28 ENCOUNTER — Non-Acute Institutional Stay (SKILLED_NURSING_FACILITY): Payer: Medicare Other | Admitting: Adult Health

## 2020-09-28 ENCOUNTER — Encounter: Payer: Self-pay | Admitting: Adult Health

## 2020-09-28 DIAGNOSIS — S72472S Torus fracture of lower end of left femur, sequela: Secondary | ICD-10-CM | POA: Diagnosis not present

## 2020-09-28 DIAGNOSIS — G309 Alzheimer's disease, unspecified: Secondary | ICD-10-CM | POA: Diagnosis not present

## 2020-09-28 DIAGNOSIS — F0280 Dementia in other diseases classified elsewhere without behavioral disturbance: Secondary | ICD-10-CM | POA: Diagnosis not present

## 2020-09-28 DIAGNOSIS — I10 Essential (primary) hypertension: Secondary | ICD-10-CM

## 2020-09-28 NOTE — Progress Notes (Signed)
Location:  [facility] Room Number: 106 B Place of Service:  SNF (31) Provider:  Kenard GowerMedina-Vargas, Kimberlyn Quiocho, DNP, FNP-BC  Patient Care Team: Kermit Baloeed, Tiffany L, DO as PCP - General (Geriatric Medicine) Chalmers GuestWhitaker, Roy, MD as Consulting Physician (Ophthalmology)  Extended Emergency Contact Information Primary Emergency Contact: Paff,[name] Address: [address]          [place], [place] [address] [place] Home Phone: [phone] Relation: Other Secondary Emergency Contact: Ollis,[name] Address: [address]          [place], [place] [address] [place] Home Phone: [phone] Relation: Son  Code Status:  FULL CODE   Goals of care: Advanced Directive information Advanced Directives 08/31/2020  Does Patient Have a Medical Advance Directive? No  Type of Advance Directive -  Does patient want to make changes to medical advance directive? -  Copy of Healthcare Power of Attorney in Chart? -  Would patient like information on creating a medical advance directive? No - Patient declined     Chief Complaint  Patient presents with  . Medical Management of Chronic Issues    Short-term Rehab Visit  . Immunizations    No documentation of any COVID vaccinations or flu vaccine.     HPI:  Pt is an 85 y.o. female seen today for medical management of chronic diseases. She is a short-term care resident of [name].  She has a PMH of Alzheimer's disease, asthma, hyperlipidemia, PE and hypertension. She is currently having a short-[facility] rehabilitation [name] post hospitalization 08/31/20 to 09/05/20 for left femur fracture sustained from a fall at home. Nonoperative management was recommended by orthopedics. She continues to use LLE immobilizer with TDWB. Resident denies pain. SBPs ranging from 100 to 145, with outlier 95 and 159. She takes Irbesartan 300 mg daily for  hypertension.   Past Medical History:  Diagnosis Date  . Alzheimer disease (HCC)   . Asthma   . Hyperlipidemia   . Hypertension   . Pulmonary embolism (HCC) 2004   was short of breath--was on coumadin for 6 mos to a year   Past Surgical History:  Procedure Laterality Date  . ABDOMINAL HYSTERECTOMY    . BUNIONECTOMY    . REPLACEMENT TOTAL KNEE  1980s, 2003   x2    No Known Allergies  Outpatient Encounter Medications as of 09/28/2020  Medication Sig  . acetaminophen (TYLENOL) 500 MG tablet Take 500 mg by mouth every 6 (six) hours as needed for fever (shoulder pain).  Marland Kitchen. amLODipine (NORVASC) 10 MG tablet TAKE 1 TABLET(10 MG) BY MOUTH DAILY  . aspirin 81 MG tablet Take 81 mg by mouth daily.  . bisacodyl (DULCOLAX) 10 MG suppository Place 10 mg rectally as needed for moderate constipation.  . budesonide-formoterol (SYMBICORT) 160-4.5 MCG/ACT inhaler Inhale 2 puffs into the lungs 2 (two) times daily.  . famotidine (PEPCID) 20 MG tablet Take 1 tablet (20 mg total) by mouth daily.  Marland Kitchen. HYDROcodone-acetaminophen (NORCO/VICODIN) 5-325 MG tablet Take 1 tablet by mouth every 12 (twelve) hours as needed for severe pain.  Marland Kitchen. irbesartan (AVAPRO) 300 MG tablet TAKE 1 TABLET(300 MG) BY MOUTH DAILY  . magnesium hydroxide (MILK OF MAGNESIA) 400 MG/5ML suspension Take 30 mLs by mouth daily as needed for mild constipation.  . melatonin 5 MG TABS Take 5 mg by mouth at bedtime.  . metoprolol tartrate (LOPRESSOR) 25 MG tablet  Take 25 mg by mouth 2 (two) times daily.  . nitroGLYCERIN (NITROSTAT) 0.4 MG SL tablet Place 1 tablet (0.4 mg total) under the tongue every 5 (five) minutes x 3 doses as needed for chest pain.  . pravastatin (PRAVACHOL) 40 MG tablet TAKE 1 TABLET(40 MG) BY MOUTH DAILY   No facility-administered encounter medications on file as of 09/28/2020.    Review of Systems  GENERAL: No change in appetite, no fatigue, no weight changes, no fever, chills or weakness MOUTH and THROAT: Denies  oral discomfort, gingival pain or bleeding RESPIRATORY: no cough, SOB, DOE, wheezing, hemoptysis CARDIAC: No chest pain, edema or palpitations GI: No abdominal pain, diarrhea, constipation, heart burn, nausea or vomiting GU: Denies dysuria, frequency, hematuria or discharge NEUROLOGICAL: Denies dizziness, syncope, numbness, or headache PSYCHIATRIC: Denies feelings of depression or anxiety. No report of hallucinations, insomnia, paranoia, or agitation   Immunization History  Administered Date(s) Administered  . Influenza, High Dose Seasonal PF 10/27/2018  . Influenza,inj,Quad PF,6+ Mos 05/27/2015, 06/18/2016  . Influenza-Unspecified 05/18/2017  . Pneumococcal Conjugate-13 10/11/2014  . Pneumococcal Polysaccharide-23 01/20/2016  . Tdap 09/17/2010  . Zoster 07/22/2014   Pertinent  Health Maintenance Due  Topic Date Due  . INFLUENZA VACCINE  04/17/2020  . DEXA SCAN  Completed  . PNA vac Low Risk Adult  Completed   Fall Risk  04/25/2020 12/09/2019 11/23/2019 04/28/2019 10/27/2018  Falls in the past year? 0 0 0 0 0  Number falls in past yr: 0 0 0 0 0  Injury with Fall? 0 0 0 0 0     Vitals:   09/28/20 1000  BP: 107/65  Pulse: (!) 59  Resp: 17  Temp: (!) 97.2 F (36.2 C)  TempSrc: Oral  Weight: 175 lb 3.2 oz (79.5 kg)  Height: 5\' 10"  (1.778 m)   Body mass index is 25.14 kg/m.  Physical Exam  GENERAL APPEARANCE: Well nourished. In no acute distress. Normal body habitus SKIN:  Skin is warm and dry.  MOUTH and THROAT: Lips are without lesions. Oral mucosa is moist and without lesions. Tongue is normal in shape, size, and color and without lesions RESPIRATORY: Breathing is even & unlabored, BS CTAB CARDIAC: RRR, no murmur,no extra heart sounds, no edema GI: Abdomen soft, normal BS, no masses, no tenderness NEUROLOGICAL: There is no tremor. Speech is clear. Alert to self, disoriented to time and place. PSYCHIATRIC:  Affect and behavior are appropriate  Labs reviewed: Recent  Labs    09/02/20 0212 09/03/20 0301 09/04/20 0443 09/12/20 0000  NA 127* 130* 132* 134*  K 3.9 4.1 3.7 4.8  CL 93* 97* 96* 98*  CO2 21* 23 24 22   GLUCOSE 105* 107* 120*  --   BUN 9 13 8 16   CREATININE 0.77 0.83 0.72 0.7  CALCIUM 8.6* 8.7* 9.0 9.5  MG  --   --  1.7  --    Recent Labs    08/31/20 1412 09/03/20 0301 09/04/20 0443 09/12/20 0000  AST 44* 29 30 17   ALT 23 18 20 16   ALKPHOS 64 44 54 92  BILITOT 1.0 0.8 1.0  --   PROT 6.9 5.7* 6.8  --   ALBUMIN 3.8 3.0* 3.4* 4.0   Recent Labs    08/31/20 1412 09/01/20 0638 09/02/20 0212 09/03/20 0301 09/04/20 0443 09/12/20 0000  WBC 4.3   < > 3.4* 2.6* 3.8* 5.5  NEUTROABS 3.2  --   --   --  2.3 3.50  HGB 13.8   < >  12.9 12.2 13.8 13.6  HCT 39.6   < > 35.4* 34.7* 40.6 40  MCV 95.2   < > 92.4 94.8 95.3  --   PLT 209   < > 179 171 211 404*   < > = values in this interval not displayed.   Lab Results  Component Value Date   TSH 1.460 09/02/2020   Lab Results  Component Value Date   HGBA1C 6.4 (H) 04/19/2015   Lab Results  Component Value Date   CHOL 192 05/03/2020   HDL 102 05/03/2020   LDLCALC 76 05/03/2020   TRIG 48 05/03/2020   CHOLHDL 1.9 05/03/2020    Significant Diagnostic Results in last 30 days:  DG Wrist Complete Left  Result Date: 08/31/2020 CLINICAL DATA:  Unwitnessed fall, pain. EXAM: LEFT WRIST - COMPLETE 3+ VIEW COMPARISON:  None. FINDINGS: No acute fracture. There is mild widening of the scapholunate interval of unknown acuity. Advanced osteoarthritis at the thumb carpal metacarpal joint. Mild radiocarpal osteoarthritis. Osteoarthritis of the metacarpal phalangeal joints most prominently involving the middle finger. There is chondrocalcinosis of the triangular fibrocartilage. There is no focal soft tissue abnormality. IMPRESSION: 1. No acute fracture of the left wrist. 2. Widening of the scapholunate interval of unknown acuity. 3. Multifocal osteoarthritis. Chondrocalcinosis of the triangular  fibrocartilage. Electronically Signed   By: Narda Rutherford M.D.   On: 08/31/2020 17:48   DG Knee 2 Views Left  Result Date: 08/31/2020 CLINICAL DATA:  Recent fall with leg pain, initial encounter EXAM: LEFT KNEE - 1-2 VIEW COMPARISON:  None. FINDINGS: Left knee prosthesis is noted in satisfactory position. There is a fracture in the medial aspect of the distal femoral metaphysis with mild displacement identified. Significant joint effusion is seen. No other focal abnormality is noted. IMPRESSION: Undisplaced distal left femoral fracture. Electronically Signed   By: Alcide Clever M.D.   On: 08/31/2020 13:15   CT Head Wo Contrast  Result Date: 08/31/2020 CLINICAL DATA:  Fall, head trauma. EXAM: CT HEAD WITHOUT CONTRAST TECHNIQUE: Contiguous axial images were obtained from the base of the skull through the vertex without intravenous contrast. COMPARISON:  None. FINDINGS: Brain: No hemorrhage. No extra-axial or subdural collection. There is equivocal loss of gray-white differentiation in the right occipital lobe versus artifact. Generalized atrophy moderate to advanced periventricular and deep white matter chronic small vessel ischemia. Bilateral basal gangliar mineralization is typically senescent. No hydrocephalus. Vascular: Atherosclerosis of skullbase vasculature without hyperdense vessel or abnormal calcification. Skull: No fracture or focal lesion. Sinuses/Orbits: Paranasal sinuses and mastoid air cells are clear. The visualized orbits are unremarkable. Frontal sinuses are hypo pneumatized. Other: None. IMPRESSION: 1. Equivocal loss of gray-white differentiation in the right occipital lobe versus artifact. Recommend correlation with neurologic symptoms. If there is clinical concern for acute infarct, recommend MRI for further evaluation. 2. No hemorrhage. 3. Generalized atrophy and chronic small vessel ischemia. Electronically Signed   By: Narda Rutherford M.D.   On: 08/31/2020 18:00   DG Chest Port 1  View  Result Date: 08/31/2020 CLINICAL DATA:  85 year old female with history of COVID-19. EXAM: PORTABLE CHEST 1 VIEW COMPARISON:  Chest x-ray 05/30/2016. FINDINGS: Lung volumes are normal. No consolidative airspace disease. No pleural effusions. No pneumothorax. No pulmonary nodule or mass noted. Pulmonary vasculature and the cardiomediastinal silhouette are within normal limits. IMPRESSION: No radiographic evidence of acute cardiopulmonary disease. Electronically Signed   By: Trudie Reed M.D.   On: 08/31/2020 17:40   DG Shoulder Left  Result Date: 08/31/2020  CLINICAL DATA:  Pain after unwitnessed fall. EXAM: LEFT SHOULDER - 2+ VIEW COMPARISON:  None. FINDINGS: Portable AP and oblique images of the shoulder obtained. Limited assessment of glenohumeral alignment. Humeral head appears centered on the glenoid on the oblique view, however appears slightly anteriorly subluxed on AP view. No acute fracture. Degenerative change of the humeral head and acromioclavicular joint. IMPRESSION: Limited assessment of glenohumeral alignment. This appears slightly anterior subluxed on the AP view, but humeral head centered in the glenoid on oblique. Recommend axillary view if patient is able. No acute fracture. Electronically Signed   By: Narda Rutherford M.D.   On: 08/31/2020 17:47   DG Hip Unilat W or Wo Pelvis 1 View Left  Result Date: 08/31/2020 CLINICAL DATA:  Unwitnessed fall last night. Left hip pain. EXAM: DG HIP (WITH OR WITHOUT PELVIS) 1V*L* COMPARISON:  None. FINDINGS: AP view of the pelvis with AP view of the left hip. The cortical margins of the bony pelvis and left hip are intact. No fracture. Pubic symphysis and sacroiliac joints are congruent. Mild bilateral hip osteoarthritis. Both femoral heads are well-seated in the respective acetabula. IMPRESSION: No pelvic or left hip fracture. Electronically Signed   By: Narda Rutherford M.D.   On: 08/31/2020 17:45    Assessment/Plan  1. Essential  hypertension -  BPs stable, continue irbesartan and amlodipine -  Monitor for BPs  2. Closed torus fracture of distal end of left femur, sequela -Nonoperative management -Continue LLE immobilizer, TDWB -Continue PRN Norco and PRN Acetaminophen for pain -Follow-up with orthopedics -Continue PT and OT, for therapeutic strengthening exercises  3. Alzheimer disease (HCC) -Continue supportive care    Family/ staff Communication: Discussed plan of care with resident and charge nurse.  Labs/tests ordered: None  Goals of care:   Short-term care   Kenard Gower, DNP, MSN, FNP-BC [facility] [phone] (Monday-Friday 8:00 a.m. - 5:00 p.m.) [phone] (after hours)

## 2020-09-29 ENCOUNTER — Encounter: Payer: Self-pay | Admitting: Adult Health

## 2020-10-07 ENCOUNTER — Non-Acute Institutional Stay (SKILLED_NURSING_FACILITY): Payer: Medicare Other | Admitting: Adult Health

## 2020-10-07 ENCOUNTER — Encounter: Payer: Self-pay | Admitting: Adult Health

## 2020-10-07 DIAGNOSIS — E785 Hyperlipidemia, unspecified: Secondary | ICD-10-CM | POA: Diagnosis not present

## 2020-10-07 DIAGNOSIS — G309 Alzheimer's disease, unspecified: Secondary | ICD-10-CM

## 2020-10-07 DIAGNOSIS — F0280 Dementia in other diseases classified elsewhere without behavioral disturbance: Secondary | ICD-10-CM

## 2020-10-07 DIAGNOSIS — I10 Essential (primary) hypertension: Secondary | ICD-10-CM | POA: Diagnosis not present

## 2020-10-07 DIAGNOSIS — S72402S Unspecified fracture of lower end of left femur, sequela: Secondary | ICD-10-CM | POA: Diagnosis not present

## 2020-10-07 DIAGNOSIS — J4520 Mild intermittent asthma, uncomplicated: Secondary | ICD-10-CM | POA: Diagnosis not present

## 2020-10-07 NOTE — Progress Notes (Signed)
Location:  [facility] Room Number: 106 B Place of Service:  SNF (31) Provider:  Kenard Gower, DNP, FNP-BC  Patient Care Team: Kermit Balo, DO as PCP - General (Geriatric Medicine) Chalmers Guest, MD as Consulting Physician (Ophthalmology)  Extended Emergency Contact Information Primary Emergency Contact: Kass,[name] Address: [address] [place], [place] [address] [place] Home Phone: [phone] Relation: Other Secondary Emergency Contact: Charters,[name] Address: [address]          [place], [place] [address] [place] Home Phone: [phone] Relation: Son  Code Status:  FULL CODE  Goals of care: Advanced Directive information Advanced Directives 10/07/2020  Does Patient Have a Medical Advance Directive? Yes  Type of Advance Directive -  Does patient want to make changes to medical advance directive? -  Copy of Healthcare Power of Attorney in Chart? -  Would patient like information on creating a medical advance directive? -     Chief Complaint  Patient presents with  . Medical Management of Chronic Issues    Short term Rehab     HPI:  Pt is an 85 y.o. female seen today for a routine short-term rehabilitation visit.  She is a short-term rehabilitation resident of [facility].  She has a PMH of Alzheimer's disease, asthma, hyperlipidemia, PE and hypertension. She continues to have PT and OT. SBPs ranging from 110 to 131, with outlier 95. She takes 300 mg 1 tab daily and amlodipine 10 mg daily for hypertension. No reported SOB nor wheezing.  She thinks budesonide-formoterol 160-4.5 mcg/ACT  2 puffs into the lungs twice a day for asthma.   Past Medical History:  Diagnosis Date  . Alzheimer disease (HCC)   . Asthma   . Hyperlipidemia   . Hypertension   . Pulmonary embolism (HCC) 2004   was short of breath--was on coumadin for 6 mos to a year   Past  Surgical History:  Procedure Laterality Date  . ABDOMINAL HYSTERECTOMY    . BUNIONECTOMY    . REPLACEMENT TOTAL KNEE  1980s, 2003   x2    No Known Allergies  Outpatient Encounter Medications as of 10/07/2020  Medication Sig  . acetaminophen (TYLENOL) 500 MG tablet Take 500 mg by mouth every 6 (six) hours as needed for fever (shoulder pain).  Marland Kitchen amLODipine (NORVASC) 10 MG tablet TAKE 1 TABLET(10 MG) BY MOUTH DAILY  . aspirin 81 MG tablet Take 81 mg by mouth daily.  . bisacodyl (DULCOLAX) 10 MG suppository Place 10 mg rectally as needed for moderate constipation.  . budesonide-formoterol (SYMBICORT) 160-4.5 MCG/ACT inhaler Inhale 2 puffs into the lungs 2 (two) times daily.  . famotidine (PEPCID) 20 MG tablet Take 1 tablet (20 mg total) by mouth daily.  Marland Kitchen HYDROcodone-acetaminophen (NORCO/VICODIN) 5-325 MG tablet Take 1 tablet by mouth every 12 (twelve) hours as needed for severe pain.  Marland Kitchen irbesartan (AVAPRO) 300 MG tablet TAKE 1 TABLET(300 MG) BY MOUTH DAILY  . magnesium hydroxide (MILK OF MAGNESIA) 400 MG/5ML suspension Take 30 mLs by mouth daily as needed for mild constipation.  . melatonin 5 MG TABS Take 5 mg by mouth at bedtime.  . metoprolol tartrate (LOPRESSOR) 25 MG tablet Take 25 mg by mouth 2 (two) times daily.  . nitroGLYCERIN (NITROSTAT) 0.4 MG SL tablet Place 1 tablet (0.4 mg total) under the tongue every 5 (five)  minutes x 3 doses as needed for chest pain.  . pravastatin (PRAVACHOL) 40 MG tablet TAKE 1 TABLET(40 MG) BY MOUTH DAILY   No facility-administered encounter medications on file as of 10/07/2020.    Review of Systems  GENERAL: No change in appetite, no fatigue, no weight changes, no fever, chills or weakness MOUTH and THROAT: Denies oral discomfort, gingival pain or bleeding RESPIRATORY: no cough, SOB, DOE, wheezing, hemoptysis CARDIAC: No chest pain, edema or palpitations GI: No abdominal pain, diarrhea, constipation, heart burn, nausea or vomiting GU: Denies  dysuria, frequency, hematuria or discharge NEUROLOGICAL: Denies dizziness, syncope, numbness, or headache PSYCHIATRIC: Denies feelings of depression or anxiety. No report of hallucinations, insomnia, paranoia, or agitation   Immunization History  Administered Date(s) Administered  . Influenza, High Dose Seasonal PF 10/27/2018  . Influenza,inj,Quad PF,6+ Mos 05/27/2015, 06/18/2016  . Influenza-Unspecified 05/18/2017  . Pneumococcal Conjugate-13 10/11/2014  . Pneumococcal Polysaccharide-23 01/20/2016  . Tdap 09/17/2010  . Zoster 07/22/2014   Pertinent  Health Maintenance Due  Topic Date Due  . INFLUENZA VACCINE  04/17/2020  . DEXA SCAN  Completed  . PNA vac Low Risk Adult  Completed   Fall Risk  04/25/2020 12/09/2019 11/23/2019 04/28/2019 10/27/2018  Falls in the past year? 0 0 0 0 0  Number falls in past yr: 0 0 0 0 0  Injury with Fall? 0 0 0 0 0     Vitals:   10/07/20 1347  BP: 107/65  Pulse: (!) 59  Temp: (!) 97.2 F (36.2 C)  Weight: 175 lb (79.4 kg)  Height: 5\' 10"  (1.778 m)   Body mass index is 25.11 kg/m.  Physical Exam  GENERAL APPEARANCE: Well nourished. In no acute distress. Normal body habitus SKIN:  Skin is warm and dry.  MOUTH and THROAT: Lips are without lesions. Oral mucosa is moist and without lesions. Tongue is normal in shape, size, and color and without lesions RESPIRATORY: Breathing is even & unlabored, BS CTAB CARDIAC: RRR, no murmur,no extra heart sounds, no edema GI: Abdomen soft, normal BS, no masses, no tenderness NEUROLOGICAL: There is no tremor. Speech is clear. Alert to self, disoriented to time and place. PSYCHIATRIC:  Affect and behavior are appropriate  Labs reviewed: Recent Labs    09/02/20 0212 09/03/20 0301 09/04/20 0443 09/12/20 0000  NA 127* 130* 132* 134*  K 3.9 4.1 3.7 4.8  CL 93* 97* 96* 98*  CO2 21* 23 24 22   GLUCOSE 105* 107* 120*  --   BUN 9 13 8 16   CREATININE 0.77 0.83 0.72 0.7  CALCIUM 8.6* 8.7* 9.0 9.5  MG  --   --   1.7  --    Recent Labs    08/31/20 1412 09/03/20 0301 09/04/20 0443 09/12/20 0000  AST 44* 29 30 17   ALT 23 18 20 16   ALKPHOS 64 44 54 92  BILITOT 1.0 0.8 1.0  --   PROT 6.9 5.7* 6.8  --   ALBUMIN 3.8 3.0* 3.4* 4.0   Recent Labs    08/31/20 1412 09/01/20 09/14/20 09/02/20 0212 09/03/20 0301 09/04/20 0443 09/12/20 0000  WBC 4.3   < > 3.4* 2.6* 3.8* 5.5  NEUTROABS 3.2  --   --   --  2.3 3.50  HGB 13.8   < > 12.9 12.2 13.8 13.6  HCT 39.6   < > 35.4* 34.7* 40.6 40  MCV 95.2   < > 92.4 94.8 95.3  --   PLT 209   < > 179  171 211 404*   < > = values in this interval not displayed.   Lab Results  Component Value Date   TSH 1.460 09/02/2020   Lab Results  Component Value Date   HGBA1C 6.4 (H) 04/19/2015   Lab Results  Component Value Date   CHOL 192 05/03/2020   HDL 102 05/03/2020   LDLCALC 76 05/03/2020   TRIG 48 05/03/2020   CHOLHDL 1.9 05/03/2020     Assessment/Plan  1. Closed fracture of distal end of left femur, unspecified fracture morphology, sequela -    Nonoperative management, LLE immobilizer -    PT with TDWB -    Continue PRN Norco and  PRN acetaminophen for pain  2. Mild intermittent asthma, unspecified whether complicated -    No wheezing, continue budesonide-formoterol  3. Essential hypertension -    BPs stable, continue irbesartan, metoprolol tartrate and amlodipine  4. Hyperlipidemia, unspecified hyperlipidemia type Lab Results  Component Value Date   CHOL 192 05/03/2020   HDL 102 05/03/2020   LDLCALC 76 05/03/2020   TRIG 48 05/03/2020   CHOLHDL 1.9 05/03/2020   -    Continue pravastatin  5. Alzheimer disease (HCC) -    Continue supportive care and fall precautions     Family/ staff Communication: Discussed plan of care with resident and charge nurse.  Labs/tests ordered: None  Goals of care:   Short-term care   Kenard Gower, DNP, MSN, FNP-BC [facility] [phone] (Monday-Friday 8:00  a.m. - 5:00 p.m.) [phone] (after hours)

## 2020-10-13 ENCOUNTER — Ambulatory Visit: Payer: Medicare Other | Admitting: Internal Medicine

## 2020-10-18 ENCOUNTER — Encounter: Payer: Self-pay | Admitting: Adult Health

## 2020-10-18 NOTE — Progress Notes (Signed)
This encounter was created in error - please disregard.

## 2020-10-20 ENCOUNTER — Encounter: Payer: Self-pay | Admitting: Adult Health

## 2020-10-20 ENCOUNTER — Non-Acute Institutional Stay (SKILLED_NURSING_FACILITY): Payer: Medicare Other | Admitting: Adult Health

## 2020-10-20 DIAGNOSIS — R55 Syncope and collapse: Secondary | ICD-10-CM

## 2020-10-20 DIAGNOSIS — Z13228 Encounter for screening for other metabolic disorders: Secondary | ICD-10-CM | POA: Diagnosis not present

## 2020-10-20 DIAGNOSIS — I10 Essential (primary) hypertension: Secondary | ICD-10-CM

## 2020-10-20 DIAGNOSIS — I499 Cardiac arrhythmia, unspecified: Secondary | ICD-10-CM | POA: Diagnosis not present

## 2020-10-20 DIAGNOSIS — D649 Anemia, unspecified: Secondary | ICD-10-CM | POA: Diagnosis not present

## 2020-10-20 LAB — BASIC METABOLIC PANEL
BUN: 16 (ref 4–21)
CO2: 21 (ref 13–22)
Chloride: 99 (ref 99–108)
Creatinine: 0.8 (ref 0.5–1.1)
Glucose: 175
Potassium: 4.8 (ref 3.4–5.3)
Sodium: 136 — AB (ref 137–147)

## 2020-10-20 LAB — CBC AND DIFFERENTIAL
HCT: 39 (ref 36–46)
Hemoglobin: 13.1 (ref 12.0–16.0)
Neutrophils Absolute: 3.9
Platelets: 292 (ref 150–399)
WBC: 5.6

## 2020-10-20 LAB — COMPREHENSIVE METABOLIC PANEL
Calcium: 9.5 (ref 8.7–10.7)
GFR calc Af Amer: 80.49
GFR calc non Af Amer: 69.45

## 2020-10-20 LAB — CBC: RBC: 4.05 (ref 3.87–5.11)

## 2020-10-20 NOTE — Progress Notes (Signed)
Location:  [facility] Room Number: 105-A Place of Service:  SNF (31) Provider:  Kenard Gower, DNP, FNP-BC  Patient Care Team: Kermit Balo, DO as PCP - General (Geriatric Medicine) Chalmers Guest, MD as Consulting Physician (Ophthalmology)  Extended Emergency Contact Information Primary Emergency Contact: Coone,[name] Address: [address] [place], [place] [address] [place] of [place] Home Phone: [phone] Relation: Other Secondary Emergency Contact: Funderburk,[name] Address: [address]          [place], [place] [address] [place] of [place] Home Phone: [phone] Relation: Son  Code Status:  FULL CODE  Goals of care: Advanced Directive information Advanced Directives 10/07/2020  Does Patient Have a Medical Advance Directive? Yes  Type of Advance Directive -  Does patient want to make changes to medical advance directive? -  Copy of Healthcare Power of Attorney in Chart? -  Would patient like information on creating a medical advance directive? -     Chief Complaint  Patient presents with  . Acute Visit    Patient is seen acutely for syncope.     HPI:  Pt is an 85 y.o. female seen today for syncope. She is a short-term care rehabilitation resident of [facility].  She has a PMH of Alzheimer's disease, asthma, hyperlipidemia, PE and hypertension. She was in activity when she was reported to have vomited X 1 and passed out. It was reported to be approximately 5 minutes when she regained consciousness.  She denies having any recollection of fainting. She denies headache. She is able to move X 4 extremities and was back to her baseline.  SBPs ranging from 128 to 136. She currently takes Irbesartan 300 mg daily and Amlodipine 10 mg daily for hypertension   Past Medical History:  Diagnosis Date  . Alzheimer disease (HCC)   . Asthma   . Hyperlipidemia   . Hypertension   .  Pulmonary embolism (HCC) 2004   was short of breath--was on coumadin for 6 mos to a year   Past Surgical History:  Procedure Laterality Date  . ABDOMINAL HYSTERECTOMY    . BUNIONECTOMY    . REPLACEMENT TOTAL KNEE  1980s, 2003   x2    No Known Allergies  Outpatient Encounter Medications as of 10/20/2020  Medication Sig  . acetaminophen (TYLENOL) 500 MG tablet Take 500 mg by mouth every 6 (six) hours as needed for fever (shoulder pain).  Marland Kitchen amLODipine (NORVASC) 10 MG tablet TAKE 1 TABLET(10 MG) BY MOUTH DAILY  . aspirin 81 MG tablet Take 81 mg by mouth daily.  . bisacodyl (DULCOLAX) 10 MG suppository Place 10 mg rectally as needed for moderate constipation.  . budesonide-formoterol (SYMBICORT) 160-4.5 MCG/ACT inhaler Inhale 2 puffs into the lungs 2 (two) times daily.  . famotidine (PEPCID) 20 MG tablet Take 1 tablet (20 mg total) by mouth daily.  Marland Kitchen HYDROcodone-acetaminophen (NORCO/VICODIN) 5-325 MG tablet Take 1 tablet by mouth every 12 (twelve) hours as needed for severe pain.  Marland Kitchen irbesartan (AVAPRO) 300 MG tablet TAKE 1 TABLET(300 MG) BY MOUTH DAILY  . magnesium hydroxide (MILK OF MAGNESIA) 400 MG/5ML suspension Take 30 mLs by mouth daily as needed for mild constipation.  . melatonin 5 MG TABS Take 5 mg by mouth at bedtime.  . metoprolol tartrate (LOPRESSOR) 25 MG tablet Take 25 mg by mouth 2 (two) times daily.  . nitroGLYCERIN (  NITROSTAT) 0.4 MG SL tablet Place 1 tablet (0.4 mg total) under the tongue every 5 (five) minutes x 3 doses as needed for chest pain.  . pravastatin (PRAVACHOL) 40 MG tablet TAKE 1 TABLET(40 MG) BY MOUTH DAILY   No facility-administered encounter medications on file as of 10/20/2020.    Review of Systems  GENERAL: No change in appetite, no fatigue, no weight changes, no fever, chills or weakness MOUTH and THROAT: Denies oral discomfort, gingival pain or bleeding RESPIRATORY: no cough, SOB, DOE, wheezing, hemoptysis CARDIAC: No chest pain, edema or  palpitations GI: No abdominal pain, diarrhea, constipation, heart burn, +vomiting X 1 GU: Denies dysuria, frequency, hematuria or discharge NEUROLOGICAL: Denies dizziness, syncope, numbness, or headache PSYCHIATRIC: Denies feelings of depression or anxiety. No report of hallucinations, insomnia, paranoia, or agitation    Immunization History  Administered Date(s) Administered  . Influenza, High Dose Seasonal PF 10/27/2018  . Influenza,inj,Quad PF,6+ Mos 05/27/2015, 06/18/2016  . Influenza-Unspecified 05/18/2017  . Pneumococcal Conjugate-13 10/11/2014  . Pneumococcal Polysaccharide-23 01/20/2016  . Tdap 09/17/2010  . Zoster 07/22/2014   Pertinent  Health Maintenance Due  Topic Date Due  . INFLUENZA VACCINE  04/17/2020  . DEXA SCAN  Completed  . PNA vac Low Risk Adult  Completed   Fall Risk  04/25/2020 12/09/2019 11/23/2019 04/28/2019 10/27/2018  Falls in the past year? 0 0 0 0 0  Number falls in past yr: 0 0 0 0 0  Injury with Fall? 0 0 0 0 0     Vitals:   10/20/20 1407  BP: 122/77  Pulse: 61  Resp: 17  Temp: (!) 97.5 F (36.4 C)  TempSrc: Oral  Weight: 173 lb 9.6 oz (78.7 kg)  Height: 5\' 10"  (1.778 m)   Body mass index is 24.91 kg/m.  Physical Exam  GENERAL APPEARANCE: Well nourished. In no acute distress. Normal body habitus SKIN:  Skin is warm and dry.  MOUTH and THROAT: Lips are without lesions. Oral mucosa is moist and without lesions. Tongue is normal in shape, size, and color and without lesions RESPIRATORY: Breathing is even & unlabored, BS CTAB CARDIAC: RRR, no murmur,no extra heart sounds, no edema GI: Abdomen soft, normal BS, no masses, no tenderness EXTREMITIES:  Able to move X 4 extremities. NEUROLOGICAL: There is no tremor. Speech is clear. Alert to self, disoriented to time and place. PSYCHIATRIC:  Affect and behavior are appropriate  Labs reviewed: Recent Labs    09/02/20 0212 09/03/20 0301 09/04/20 0443 09/12/20 0000  NA 127* 130* 132* 134*  K  3.9 4.1 3.7 4.8  CL 93* 97* 96* 98*  CO2 21* 23 24 22   GLUCOSE 105* 107* 120*  --   BUN 9 13 8 16   CREATININE 0.77 0.83 0.72 0.7  CALCIUM 8.6* 8.7* 9.0 9.5  MG  --   --  1.7  --    Recent Labs    08/31/20 1412 09/03/20 0301 09/04/20 0443 09/12/20 0000  AST 44* 29 30 17   ALT 23 18 20 16   ALKPHOS 64 44 54 92  BILITOT 1.0 0.8 1.0  --   PROT 6.9 5.7* 6.8  --   ALBUMIN 3.8 3.0* 3.4* 4.0   Recent Labs    08/31/20 1412 09/01/20 0638 09/02/20 0212 09/03/20 0301 09/04/20 0443 09/12/20 0000  WBC 4.3   < > 3.4* 2.6* 3.8* 5.5  NEUTROABS 3.2  --   --   --  2.3 3.50  HGB 13.8   < > 12.9 12.2 13.8  13.6  HCT 39.6   < > 35.4* 34.7* 40.6 40  MCV 95.2   < > 92.4 94.8 95.3  --   PLT 209   < > 179 171 211 404*   < > = values in this interval not displayed.   Lab Results  Component Value Date   TSH 1.460 09/02/2020   Lab Results  Component Value Date   HGBA1C 6.4 (H) 04/19/2015   Lab Results  Component Value Date   CHOL 192 05/03/2020   HDL 102 05/03/2020   LDLCALC 76 05/03/2020   TRIG 48 05/03/2020   CHOLHDL 1.9 05/03/2020    Assessment/Plan  1. Syncope, unspecified syncope type -   She regained consciousness after 5 minutes and back to her baseline -    Will check BMP, CBC and EKG stat  2. Primary hypertension -  BPs stable, continue Irbesartan and Amlodipine -   Monitor BPs    Family/ staff Communication: Discussed plan of care with resident and charge nurse.  Labs/tests ordered: CBC, CMP and EKG stat  Goals of care:   Short-term care  Kenard Gower, DNP, MSN, FNP-BC [facility] [phone] (Monday-Friday 8:00 a.m. - 5:00 p.m.) [phone] (after hours)

## 2020-10-24 DIAGNOSIS — S72492A Other fracture of lower end of left femur, initial encounter for closed fracture: Secondary | ICD-10-CM | POA: Diagnosis not present

## 2020-10-24 DIAGNOSIS — Z96652 Presence of left artificial knee joint: Secondary | ICD-10-CM | POA: Diagnosis not present

## 2020-10-25 ENCOUNTER — Encounter: Payer: Self-pay | Admitting: Adult Health

## 2020-10-25 ENCOUNTER — Other Ambulatory Visit: Payer: Self-pay | Admitting: Adult Health

## 2020-10-25 ENCOUNTER — Non-Acute Institutional Stay (SKILLED_NURSING_FACILITY): Payer: Medicare Other | Admitting: Adult Health

## 2020-10-25 DIAGNOSIS — E785 Hyperlipidemia, unspecified: Secondary | ICD-10-CM

## 2020-10-25 DIAGNOSIS — K219 Gastro-esophageal reflux disease without esophagitis: Secondary | ICD-10-CM | POA: Diagnosis not present

## 2020-10-25 DIAGNOSIS — I10 Essential (primary) hypertension: Secondary | ICD-10-CM | POA: Diagnosis not present

## 2020-10-25 DIAGNOSIS — G309 Alzheimer's disease, unspecified: Secondary | ICD-10-CM

## 2020-10-25 DIAGNOSIS — J4520 Mild intermittent asthma, uncomplicated: Secondary | ICD-10-CM

## 2020-10-25 DIAGNOSIS — S72402S Unspecified fracture of lower end of left femur, sequela: Secondary | ICD-10-CM

## 2020-10-25 DIAGNOSIS — F0280 Dementia in other diseases classified elsewhere without behavioral disturbance: Secondary | ICD-10-CM

## 2020-10-25 MED ORDER — METOPROLOL TARTRATE 25 MG PO TABS: 25.0000 mg | ORAL_TABLET | Freq: Two times a day (BID) | ORAL | 0 refills | Status: DC

## 2020-10-25 MED ORDER — IRBESARTAN 300 MG PO TABS: 300.0000 mg | ORAL_TABLET | Freq: Every day | ORAL | 0 refills | Status: DC

## 2020-10-25 MED ORDER — AMLODIPINE BESYLATE 10 MG PO TABS
ORAL_TABLET | ORAL | 0 refills | Status: DC
Start: 2020-10-25 — End: 2020-11-28

## 2020-10-25 MED ORDER — NITROGLYCERIN 0.4 MG SL SUBL: 0.4000 mg | SUBLINGUAL_TABLET | SUBLINGUAL | 0 refills | Status: AC | PRN

## 2020-10-25 MED ORDER — FAMOTIDINE 20 MG PO TABS
20.0000 mg | ORAL_TABLET | Freq: Every day | ORAL | 0 refills | Status: DC
Start: 2020-10-25 — End: 2020-11-03

## 2020-10-25 MED ORDER — HYDROCODONE-ACETAMINOPHEN 5-325 MG PO TABS
1.0000 | ORAL_TABLET | Freq: Two times a day (BID) | ORAL | 0 refills | Status: DC | PRN
Start: 2020-10-25 — End: 2021-03-01

## 2020-10-25 MED ORDER — BUDESONIDE-FORMOTEROL FUMARATE 160-4.5 MCG/ACT IN AERO: 2.0000 | INHALATION_SPRAY | Freq: Two times a day (BID) | RESPIRATORY_TRACT | 0 refills | Status: DC

## 2020-10-25 MED ORDER — PRAVASTATIN SODIUM 40 MG PO TABS: ORAL_TABLET | ORAL | 0 refills | Status: DC

## 2020-10-25 NOTE — Progress Notes (Signed)
Location:  [facility] Room Number: 126-A Place of Service:  SNF (31) Provider:  Kenard Gower, DNP, FNP-BC  Patient Care Team: Kermit Balo, DO as PCP - General (Geriatric Medicine) Chalmers Guest, MD as Consulting Physician (Ophthalmology)  Extended Emergency Contact Information Primary Emergency Contact: Hanford,[name] Address: [address] [place], [place] [address] [place] of [place] Home Phone: [phone] Relation: Other Secondary Emergency Contact: Vetter,[name] Address: [address]          [place], [place] [address] [place] of [place] Home Phone: [phone] Relation: Son  Code Status:  FULL CODE  Goals of care: Advanced Directive information Advanced Directives 10/07/2020  Does Patient Have a Medical Advance Directive? Yes  Type of Advance Directive -  Does patient want to make changes to medical advance directive? -  Copy of Healthcare Power of Attorney in Chart? -  Would patient like information on creating a medical advance directive? -     Chief Complaint  Patient presents with  . Discharge Note    Patient is seen for discharge from SNF on 10/26/20    HPI:  Pt is an 85 y.o. female seen today for discharge from SNF. She will discharge home on 10/26/20 with Home health PT and OT.  She was admitted to [facility] on 09/05/20 post [facility] hospitalization 08/31/20 to 09/05/20 for left femur fracture sustained from a fall at home. Nonoperative management was recommended by orthopedics. She was put on a knee immobilizer which is now discontinued.She is now touchdown weight bearing and may use a walker with ambulation. She has a PMH of alzheimer's disease, asthma, hyperlipidemia., PE and hypertension.  Patient was admitted to this facility for short-term rehabilitation after the patient's recent hospitalization.  Patient has completed SNF rehabilitation and therapy has cleared the  patient for discharge.   Past Medical History:  Diagnosis Date  . Alzheimer disease (HCC)   . Asthma   . Hyperlipidemia   . Hypertension   . Pulmonary embolism (HCC) 2004   was short of breath--was on coumadin for 6 mos to a year   Past Surgical History:  Procedure Laterality Date  . ABDOMINAL HYSTERECTOMY    . BUNIONECTOMY    . REPLACEMENT TOTAL KNEE  1980s, 2003   x2    No Known Allergies  Outpatient Encounter Medications as of 10/25/2020  Medication Sig  . acetaminophen (TYLENOL) 500 MG tablet Take 500 mg by mouth every 6 (six) hours as needed for fever (shoulder pain).  Marland Kitchen amLODipine (NORVASC) 10 MG tablet TAKE 1 TABLET(10 MG) BY MOUTH DAILY  . aspirin 81 MG tablet Take 81 mg by mouth daily.  . bisacodyl (DULCOLAX) 10 MG suppository Place 10 mg rectally as needed for moderate constipation.  . budesonide-formoterol (SYMBICORT) 160-4.5 MCG/ACT inhaler Inhale 2 puffs into the lungs 2 (two) times daily.  . famotidine (PEPCID) 20 MG tablet Take 1 tablet (20 mg total) by mouth daily.  Marland Kitchen HYDROcodone-acetaminophen (NORCO/VICODIN) 5-325 MG tablet Take 1 tablet by mouth every 12 (twelve) hours as needed for severe pain.  Marland Kitchen irbesartan (AVAPRO) 300 MG tablet TAKE 1 TABLET(300 MG) BY MOUTH DAILY  . magnesium hydroxide (MILK OF MAGNESIA) 400 MG/5ML suspension Take 30 mLs by mouth daily as needed for mild constipation.  . melatonin 5 MG TABS Take 5 mg by mouth at bedtime.  . metoprolol tartrate (LOPRESSOR) 25  MG tablet Take 25 mg by mouth 2 (two) times daily.  . nitroGLYCERIN (NITROSTAT) 0.4 MG SL tablet Place 1 tablet (0.4 mg total) under the tongue every 5 (five) minutes x 3 doses as needed for chest pain.  . pravastatin (PRAVACHOL) 40 MG tablet TAKE 1 TABLET(40 MG) BY MOUTH DAILY   No facility-administered encounter medications on file as of 10/25/2020.    Review of Systems  GENERAL: No change in appetite, no fatigue, no weight changes, no fever, chills or weakness MOUTH and THROAT:  Denies oral discomfort, gingival pain or bleeding RESPIRATORY: no cough, SOB, DOE, wheezing, hemoptysis CARDIAC: No chest pain, edema or palpitations GI: No abdominal pain, diarrhea, constipation, heart burn, nausea or vomiting GU: Denies dysuria, frequency, hematuria, incontinence, or discharge NEUROLOGICAL: Denies dizziness, syncope, numbness, or headache PSYCHIATRIC: Denies feelings of depression or anxiety. No report of hallucinations, insomnia, paranoia, or agitation   Immunization History  Administered Date(s) Administered  . Influenza, High Dose Seasonal PF 10/27/2018  . Influenza,inj,Quad PF,6+ Mos 05/27/2015, 06/18/2016  . Influenza-Unspecified 05/18/2017  . Pneumococcal Conjugate-13 10/11/2014  . Pneumococcal Polysaccharide-23 01/20/2016  . Tdap 09/17/2010  . Zoster 07/22/2014   Pertinent  Health Maintenance Due  Topic Date Due  . INFLUENZA VACCINE  04/17/2020  . DEXA SCAN  Completed  . PNA vac Low Risk Adult  Completed   Fall Risk  04/25/2020 12/09/2019 11/23/2019 04/28/2019 10/27/2018  Falls in the past year? 0 0 0 0 0  Number falls in past yr: 0 0 0 0 0  Injury with Fall? 0 0 0 0 0     Vitals:   10/25/20 1236  BP: 138/75  Pulse: 60  Resp: 20  Temp: 97.6 F (36.4 C)  TempSrc: Oral  Weight: 173 lb 9.6 oz (78.7 kg)  Height: 5\' 10"  (1.778 m)   Body mass index is 24.91 kg/m.  Physical Exam  GENERAL APPEARANCE: Well nourished. In no acute distress. Normal body habitus SKIN:  Skin is warm and dry.  MOUTH and THROAT: Lips are without lesions. Oral mucosa is moist and without lesions. Tongue is normal in shape, size, and color and without lesions RESPIRATORY: Breathing is even & unlabored, BS CTAB CARDIAC: RRR, no murmur,no extra heart sounds, no edema GI: Abdomen soft, normal BS, no masses, no tenderness EXTREMITIES:  Able to move X 4 extremities NEUROLOGICAL: There is no tremor. Speech is clear. Alert to self, disoriented to time and place. PSYCHIATRIC:  Affect  and behavior are appropriate  Labs reviewed: Recent Labs    09/02/20 0212 09/03/20 0301 09/04/20 0443 09/12/20 0000  NA 127* 130* 132* 134*  K 3.9 4.1 3.7 4.8  CL 93* 97* 96* 98*  CO2 21* 23 24 22   GLUCOSE 105* 107* 120*  --   BUN 9 13 8 16   CREATININE 0.77 0.83 0.72 0.7  CALCIUM 8.6* 8.7* 9.0 9.5  MG  --   --  1.7  --    Recent Labs    08/31/20 1412 09/03/20 0301 09/04/20 0443 09/12/20 0000  AST 44* 29 30 17   ALT 23 18 20 16   ALKPHOS 64 44 54 92  BILITOT 1.0 0.8 1.0  --   PROT 6.9 5.7* 6.8  --   ALBUMIN 3.8 3.0* 3.4* 4.0   Recent Labs    08/31/20 1412 09/01/20 0638 09/02/20 0212 09/03/20 0301 09/04/20 0443 09/12/20 0000  WBC 4.3   < > 3.4* 2.6* 3.8* 5.5  NEUTROABS 3.2  --   --   --  2.3 3.50  HGB 13.8   < > 12.9 12.2 13.8 13.6  HCT 39.6   < > 35.4* 34.7* 40.6 40  MCV 95.2   < > 92.4 94.8 95.3  --   PLT 209   < > 179 171 211 404*   < > = values in this interval not displayed.   Lab Results  Component Value Date   TSH 1.460 09/02/2020   Lab Results  Component Value Date   HGBA1C 6.4 (H) 04/19/2015   Lab Results  Component Value Date   CHOL 192 05/03/2020   HDL 102 05/03/2020   LDLCALC 76 05/03/2020   TRIG 48 05/03/2020   CHOLHDL 1.9 05/03/2020    Assessment/Plan  1. Closed fracture of distal end of left femur, unspecified fracture morphology, sequela -   nonoperative management was  Recommended by orthopedics -   TDWB, may use walker with ambulation -    Will have Home health PT and OT at home -   Follow up with orthopedics - HYDROcodone-acetaminophen (NORCO/VICODIN) 5-325 MG tablet; Take 1 tablet by mouth every 12 (twelve) hours as needed for severe pain.  Dispense: 15 tablet; Refill: 0  2. Primary hypertension - amLODipine (NORVASC) 10 MG tablet; TAKE 1 TABLET(10 MG) BY MOUTH DAILY  Dispense: 30 tablet; Refill: 0 - irbesartan (AVAPRO) 300 MG tablet; Take 1 tablet (300 mg total) by mouth daily.  Dispense: 30 tablet; Refill: 0 - metoprolol  tartrate (LOPRESSOR) 25 MG tablet; Take 1 tablet (25 mg total) by mouth 2 (two) times daily.  Dispense: 60 tablet; Refill: 0  3. Mild intermittent asthma, unspecified whether complicated - budesonide-formoterol (SYMBICORT) 160-4.5 MCG/ACT inhaler; Inhale 2 puffs into the lungs 2 (two) times daily.  Dispense: 6 g; Refill: 0  4. Gastroesophageal reflux disease without esophagitis - famotidine (PEPCID) 20 MG tablet; Take 1 tablet (20 mg total) by mouth daily.  Dispense: 30 tablet; Refill: 0  5. Hyperlipidemia, unspecified hyperlipidemia type - pravastatin (PRAVACHOL) 40 MG tablet; TAKE 1 TABLET(40 MG) BY MOUTH DAILY  Dispense: 30 tablet; Refill: 0  6. Alzheimer disease (HCC) -  Continue supportive care     I have filled out patient's discharge paperwork and written prescriptions.  Patient will receive home health PT and OT.  DME provided:  Management consultant -  patient suffers from  Closed left femur fracture which impairs her ability to perform daily activities like toileting, feeding, dressing, grooming and bathing in the home. A cane or walker will not resolve issue with performing activities of daily living. A wheelchair will allow patient to safely perform daily activities. Patient can safely propel the wheelchair in the home.   Total discharge time: Greater than 30 minutes Greater than 50% was spent in counseling and coordination of care.    Discharge time involved coordination of the discharge process with social worker, nursing staff and therapy department. Medical justification for home health services/DME verified.   Kenard Gower, DNP, MSN, FNP-BC [facility] [phone] (Monday-Friday 8:00 a.m. - 5:00 p.m.) [phone] (after hours)

## 2020-10-26 ENCOUNTER — Ambulatory Visit: Payer: Medicare Other | Admitting: Nurse Practitioner

## 2020-10-26 DIAGNOSIS — R279 Unspecified lack of coordination: Secondary | ICD-10-CM | POA: Diagnosis not present

## 2020-10-26 DIAGNOSIS — R2681 Unsteadiness on feet: Secondary | ICD-10-CM | POA: Diagnosis not present

## 2020-10-26 DIAGNOSIS — R296 Repeated falls: Secondary | ICD-10-CM | POA: Diagnosis not present

## 2020-10-26 DIAGNOSIS — R2689 Other abnormalities of gait and mobility: Secondary | ICD-10-CM | POA: Diagnosis not present

## 2020-10-27 ENCOUNTER — Other Ambulatory Visit: Payer: Self-pay | Admitting: Internal Medicine

## 2020-10-27 DIAGNOSIS — I10 Essential (primary) hypertension: Secondary | ICD-10-CM

## 2020-10-28 ENCOUNTER — Ambulatory Visit: Payer: Medicare Other | Admitting: Family

## 2020-11-03 ENCOUNTER — Encounter: Payer: Self-pay | Admitting: Internal Medicine

## 2020-11-03 ENCOUNTER — Other Ambulatory Visit: Payer: Self-pay

## 2020-11-03 ENCOUNTER — Ambulatory Visit: Payer: Medicare Other | Admitting: Internal Medicine

## 2020-11-03 VITALS — BP 124/72 | HR 84 | Temp 97.5°F | Ht 70.0 in | Wt 181.2 lb

## 2020-11-03 DIAGNOSIS — F1020 Alcohol dependence, uncomplicated: Secondary | ICD-10-CM

## 2020-11-03 DIAGNOSIS — I10 Essential (primary) hypertension: Secondary | ICD-10-CM | POA: Diagnosis not present

## 2020-11-03 DIAGNOSIS — S72402S Unspecified fracture of lower end of left femur, sequela: Secondary | ICD-10-CM

## 2020-11-03 DIAGNOSIS — G301 Alzheimer's disease with late onset: Secondary | ICD-10-CM | POA: Diagnosis not present

## 2020-11-03 DIAGNOSIS — J4520 Mild intermittent asthma, uncomplicated: Secondary | ICD-10-CM

## 2020-11-03 DIAGNOSIS — E871 Hypo-osmolality and hyponatremia: Secondary | ICD-10-CM

## 2020-11-03 DIAGNOSIS — F0280 Dementia in other diseases classified elsewhere without behavioral disturbance: Secondary | ICD-10-CM

## 2020-11-03 DIAGNOSIS — E441 Mild protein-calorie malnutrition: Secondary | ICD-10-CM

## 2020-11-03 NOTE — Patient Instructions (Signed)
I recommend you sit in a lift chair so you can elevate your feet.

## 2020-11-03 NOTE — Progress Notes (Signed)
[facility] Provider: Teancum Brule L. Renato Gailseed, D.O., C.M.D.  Goals of Care:  Advanced Directives 10/07/2020  Does Patient Have a Medical Advance Directive? Yes  Type of Advance Directive -  Does patient want to make changes to medical advance directive? -  Copy of Healthcare Power of Attorney in Chart? -  Would patient like information on creating a medical advance directive? -     Chief Complaint  Patient presents with  . Medical Management of Chronic Issues    Follow up from rehab with fasting labs to follow     HPI: Patient is a 85 y.o. female seen today for follow-up s/p admission from 12/15-21 for fall with left femoral fracture and incidental covid infection. She and family had declined covid vaccination.  She had been unable to stand and left leg dangling really hurt.  She'd had a prior knee replacement on the left.    [facility].    Per discharge summary reviewed:   Significant studies: 12/15>> x-ray hip left: No pelvic/hip fracture 12/15>> x-ray left knee: Undisplaced distal left femoral fracture 12/15>> x-ray left shoulder: No acute fracture 12/15>> x-ray left wrist: No acute fracture 12/15>> CT head: No acute abnormalities 12/15>>Chest x-ray: No pneumonia  COVID-19 medications: 12/15>> monoclonal antibody x1  Nonoperative mgt recommended.    Family is all moved in to bottom half of the house and furniture moved and they are now living with her.    She does not c/o hip pain, but still c/o left shoulder.  She's going to get therapy for it also.  Back is fine lately.    Blood pressure is good.  Metoprolol was added b/c she was stressed and bp up at rehab.  She is resting fine at home 8pm to bed, sleeping by 10pm till almost 8am.    She's getting a norco if she hurts all over once a day if tylenol ineffective.    She is going to get her covid vaccines after the 90 days out from the monoclonal antibodies.  She was  asymptomatic outside of fever one night and a cough.    She is not drinking wine like she used to--having gatorade instead.    She's having regular bms.  She is eating ok now.    Past Medical History:  Diagnosis Date  . Alzheimer disease (HCC)   . Asthma   . Hyperlipidemia   . Hypertension   . Pulmonary embolism (HCC) 2004   was short of breath--was on coumadin for 6 mos to a year    Past Surgical History:  Procedure Laterality Date  . ABDOMINAL HYSTERECTOMY    . BUNIONECTOMY    . REPLACEMENT TOTAL KNEE  1980s, 2003   x2    No Known Allergies  Outpatient Encounter Medications as of 11/03/2020  Medication Sig  . acetaminophen (TYLENOL) 500 MG tablet Take 500 mg by mouth every 6 (six) hours as needed for fever (shoulder pain).  Marland Kitchen. amLODipine (NORVASC) 10 MG tablet TAKE 1 TABLET(10 MG) BY MOUTH DAILY  . aspirin 81 MG tablet Take 81 mg by mouth daily.  Marland Kitchen. HYDROcodone-acetaminophen (NORCO/VICODIN) 5-325 MG tablet Take 1 tablet by mouth every 12 (twelve) hours as needed for severe pain.  Marland Kitchen. irbesartan (AVAPRO) 300 MG tablet Take 1 tablet (300 mg total) by mouth daily.  . metoprolol tartrate (LOPRESSOR) 25 MG tablet Take 1 tablet (25 mg total) by mouth 2 (two) times daily.  . nitroGLYCERIN (  NITROSTAT) 0.4 MG SL tablet Place 1 tablet (0.4 mg total) under the tongue every 5 (five) minutes x 3 doses as needed for chest pain.  . pravastatin (PRAVACHOL) 40 MG tablet TAKE 1 TABLET(40 MG) BY MOUTH DAILY  . [DISCONTINUED] bisacodyl (DULCOLAX) 10 MG suppository Place 10 mg rectally as needed for moderate constipation.  . [DISCONTINUED] famotidine (PEPCID) 20 MG tablet Take 1 tablet (20 mg total) by mouth daily.  . [DISCONTINUED] budesonide-formoterol (SYMBICORT) 160-4.5 MCG/ACT inhaler Inhale 2 puffs into the lungs 2 (two) times daily.  . [DISCONTINUED] magnesium hydroxide (MILK OF MAGNESIA) 400 MG/5ML suspension Take 30 mLs by mouth daily as needed for mild constipation.  . [DISCONTINUED]  melatonin 5 MG TABS Take 5 mg by mouth at bedtime.   No facility-administered encounter medications on file as of 11/03/2020.    Review of Systems:  Review of Systems  Constitutional: Negative for chills, fever and malaise/fatigue.  HENT: Negative for congestion and sore throat.   Eyes: Negative for blurred vision.       Glasses  Respiratory: Negative for cough and shortness of breath.   Cardiovascular: Positive for leg swelling. Negative for chest pain and palpitations.       Problematic when feet are not elevated   Gastrointestinal: Negative for abdominal pain and constipation.  Genitourinary: Negative for dysuria.  Musculoskeletal: Negative for falls and joint pain.  Skin: Negative for itching and rash.  Neurological: Negative for dizziness and loss of consciousness.  Endo/Heme/Allergies: Bruises/bleeds easily.  Psychiatric/Behavioral: Positive for memory loss. Negative for depression. The patient is not nervous/anxious and does not have insomnia.     Health Maintenance  Topic Date Due  . COVID-19 Vaccine (1) Never done  . INFLUENZA VACCINE  04/17/2020  . TETANUS/TDAP  12/15/2020 (Originally 09/17/2020)  . DEXA SCAN  Completed  . PNA vac Low Risk Adult  Completed    Physical Exam: There were no vitals filed for this visit. There is no height or weight on file to calculate BMI. Physical Exam Vitals reviewed.  Constitutional:      Appearance: Normal appearance.  HENT:     Head: Normocephalic and atraumatic.  Eyes:     Extraocular Movements: Extraocular movements intact.     Pupils: Pupils are equal, round, and reactive to light.     Comments: glasses  Cardiovascular:     Rate and Rhythm: Normal rate and regular rhythm.     Pulses: Normal pulses.     Heart sounds: Normal heart sounds.  Pulmonary:     Effort: Pulmonary effort is normal.     Breath sounds: Normal breath sounds.  Abdominal:     General: Bowel sounds are normal.     Palpations: Abdomen is soft.   Musculoskeletal:        General: Normal range of motion.     Right lower leg: Edema present.     Left lower leg: Edema present.  Skin:    General: Skin is warm and dry.  Neurological:     General: No focal deficit present.     Mental Status: She is alert.     Motor: Weakness present.     Gait: Gait abnormal.     Comments: Using manual wheelchair  Psychiatric:        Mood and Affect: Mood normal.        Behavior: Behavior normal.     Labs reviewed: Basic Metabolic Panel: Recent Labs    12/14/19 1025 05/03/20 1014 09/02/20 0212 09/02/20 0746 09/03/20  0301 09/04/20 0443 09/12/20 0000 10/20/20 0000  NA 133*   < > 127*  --  130* 132* 134* 136*  K 4.6   < > 3.9  --  4.1 3.7 4.8 4.8  CL 98   < > 93*  --  97* 96* 98* 99  CO2 24   < > 21*  --  23 24 22 21   GLUCOSE 103*   < > 105*  --  107* 120*  --   --   BUN 11   < > 9  --  13 8 16 16   CREATININE 0.87   < > 0.77  --  0.83 0.72 0.7 0.8  CALCIUM 10.1   < > 8.6*  --  8.7* 9.0 9.5 9.5  MG  --   --   --   --   --  1.7  --   --   TSH 1.62  --   --  1.460  --   --   --   --    < > = values in this interval not displayed.   Liver Function Tests: Recent Labs    08/31/20 1412 09/03/20 0301 09/04/20 0443 09/12/20 0000  AST 44* 29 30 17   ALT 23 18 20 16   ALKPHOS 64 44 54 92  BILITOT 1.0 0.8 1.0  --   PROT 6.9 5.7* 6.8  --   ALBUMIN 3.8 3.0* 3.4* 4.0   No results for input(s): LIPASE, AMYLASE in the last 8760 hours. No results for input(s): AMMONIA in the last 8760 hours. CBC: Recent Labs    09/02/20 0212 09/03/20 0301 09/04/20 0443 09/12/20 0000 10/20/20 0000  WBC 3.4* 2.6* 3.8* 5.5 5.6  NEUTROABS  --   --  2.3 3.50 3.90  HGB 12.9 12.2 13.8 13.6 13.1  HCT 35.4* 34.7* 40.6 40 39  MCV 92.4 94.8 95.3  --   --   PLT 179 171 211 404* 292   Lipid Panel: Recent Labs    12/14/19 1025 05/03/20 1014  CHOL 180 192  HDL 96 102  LDLCALC 70 76  TRIG 65 48  CHOLHDL 1.9 1.9   Lab Results  Component Value Date    HGBA1C 6.4 (H) 04/19/2015    Procedures since last visit: DG Wrist Complete Left  Result Date: 08/31/2020 CLINICAL DATA:  Unwitnessed fall, pain. EXAM: LEFT WRIST - COMPLETE 3+ VIEW COMPARISON:  None. FINDINGS: No acute fracture. There is mild widening of the scapholunate interval of unknown acuity. Advanced osteoarthritis at the thumb carpal metacarpal joint. Mild radiocarpal osteoarthritis. Osteoarthritis of the metacarpal phalangeal joints most prominently involving the middle finger. There is chondrocalcinosis of the triangular fibrocartilage. There is no focal soft tissue abnormality. IMPRESSION: 1. No acute fracture of the left wrist. 2. Widening of the scapholunate interval of unknown acuity. 3. Multifocal osteoarthritis. Chondrocalcinosis of the triangular fibrocartilage. Electronically Signed   By: 12/16/19 M.D.   On: 08/31/2020 17:48   DG Knee 2 Views Left  Result Date: 08/31/2020 CLINICAL DATA:  Recent fall with leg pain, initial encounter EXAM: LEFT KNEE - 1-2 VIEW COMPARISON:  None. FINDINGS: Left knee prosthesis is noted in satisfactory position. There is a fracture in the medial aspect of the distal femoral metaphysis with mild displacement identified. Significant joint effusion is seen. No other focal abnormality is noted. IMPRESSION: Undisplaced distal left femoral fracture. Electronically Signed   By: 09/02/2020 M.D.   On: 08/31/2020 13:15   CT Head Wo Contrast  Result Date: 08/31/2020 CLINICAL DATA:  Fall, head trauma. EXAM: CT HEAD WITHOUT CONTRAST TECHNIQUE: Contiguous axial images were obtained from the base of the skull through the vertex without intravenous contrast. COMPARISON:  None. FINDINGS: Brain: No hemorrhage. No extra-axial or subdural collection. There is equivocal loss of gray-white differentiation in the right occipital lobe versus artifact. Generalized atrophy moderate to advanced periventricular and deep white matter chronic small vessel ischemia.  Bilateral basal gangliar mineralization is typically senescent. No hydrocephalus. Vascular: Atherosclerosis of skullbase vasculature without hyperdense vessel or abnormal calcification. Skull: No fracture or focal lesion. Sinuses/Orbits: Paranasal sinuses and mastoid air cells are clear. The visualized orbits are unremarkable. Frontal sinuses are hypo pneumatized. Other: None. IMPRESSION: 1. Equivocal loss of gray-white differentiation in the right occipital lobe versus artifact. Recommend correlation with neurologic symptoms. If there is clinical concern for acute infarct, recommend MRI for further evaluation. 2. No hemorrhage. 3. Generalized atrophy and chronic small vessel ischemia. Electronically Signed   By: Narda Rutherford M.D.   On: 08/31/2020 18:00   DG Chest Port 1 View  Result Date: 08/31/2020 CLINICAL DATA:  85 year old female with history of COVID-19. EXAM: PORTABLE CHEST 1 VIEW COMPARISON:  Chest x-ray 05/30/2016. FINDINGS: Lung volumes are normal. No consolidative airspace disease. No pleural effusions. No pneumothorax. No pulmonary nodule or mass noted. Pulmonary vasculature and the cardiomediastinal silhouette are within normal limits. IMPRESSION: No radiographic evidence of acute cardiopulmonary disease. Electronically Signed   By: Trudie  M.D.   On: 08/31/2020 17:40   DG Shoulder Left  Result Date: 08/31/2020 CLINICAL DATA:  Pain after unwitnessed fall. EXAM: LEFT SHOULDER - 2+ VIEW COMPARISON:  None. FINDINGS: Portable AP and oblique images of the shoulder obtained. Limited assessment of glenohumeral alignment. Humeral head appears centered on the glenoid on the oblique view, however appears slightly anteriorly subluxed on AP view. No acute fracture. Degenerative change of the humeral head and acromioclavicular joint. IMPRESSION: Limited assessment of glenohumeral alignment. This appears slightly anterior subluxed on the AP view, but humeral head centered in the glenoid on  oblique. Recommend axillary view if patient is able. No acute fracture. Electronically Signed   By: Narda Rutherford M.D.   On: 08/31/2020 17:47   DG Hip Unilat W or Wo Pelvis 1 View Left  Result Date: 08/31/2020 CLINICAL DATA:  Unwitnessed fall last night. Left hip pain. EXAM: DG HIP (WITH OR WITHOUT PELVIS) 1V*L* COMPARISON:  None. FINDINGS: AP view of the pelvis with AP view of the left hip. The cortical margins of the bony pelvis and left hip are intact. No fracture. Pubic symphysis and sacroiliac joints are congruent. Mild bilateral hip osteoarthritis. Both femoral heads are well-seated in the respective acetabula. IMPRESSION: No pelvic or left hip fracture. Electronically Signed   By: Narda Rutherford M.D.   On: 08/31/2020 17:45    Assessment/Plan 1. Mild protein-calorie malnutrition (HCC) -improving, eating well at home  2. Uncomplicated alcohol dependence (HCC) -is drinking less wine since return home from rehab, more gatorade--helped by family now living with her  3. Late onset Alzheimer's disease without behavioral disturbance (HCC) -requires supervision of family and help with daily activities now since fracture  4. Primary hypertension -bp controlled, cont same regimen and monitor  5. Closed fracture of distal end of left femur, unspecified fracture morphology, sequela -managed conservatively, pain here not an issue, doing PT  6. Mild intermittent asthma, unspecified whether complicated -no recent difficulty; no longer on inhaler  7. Hyponatremia - had improved while in  rehab, but needs recheck since return home, is continuing gatorade intake - Basic metabolic panel  Labs/tests ordered:   Lab Orders     Basic metabolic panel  Next appt:  4 mos med mgt, fast for labs  Rohin Krejci L. Buffi Ewton, D.O. Geriatrics [facility] [address] [place], [place] [address] Cell Phone (Mon-Fri 8am-5pm):  [phone] On Call:  [phone] & follow  prompts after 5pm & weekends Office Phone:  [phone] Office Fax:  [phone]

## 2020-11-04 LAB — BASIC METABOLIC PANEL
BUN: 13 mg/dL (ref 7–25)
CO2: 25 mmol/L (ref 20–32)
Calcium: 9.2 mg/dL (ref 8.6–10.4)
Chloride: 101 mmol/L (ref 98–110)
Creat: 0.67 mg/dL (ref 0.60–0.88)
Glucose, Bld: 86 mg/dL (ref 65–99)
Potassium: 5 mmol/L (ref 3.5–5.3)
Sodium: 136 mmol/L (ref 135–146)

## 2020-11-04 NOTE — Progress Notes (Signed)
Electrolytes and kidneys were stable including her sodium. Please notify her son that she should continue her gatorade and current regimen at home.   I also recommend that when he brings her next time, that he fills in a proxy form so he could see her results in [place]

## 2020-11-07 ENCOUNTER — Encounter: Payer: Self-pay | Admitting: Internal Medicine

## 2020-11-14 DIAGNOSIS — M19012 Primary osteoarthritis, left shoulder: Secondary | ICD-10-CM | POA: Diagnosis not present

## 2020-11-14 DIAGNOSIS — Z96652 Presence of left artificial knee joint: Secondary | ICD-10-CM | POA: Diagnosis not present

## 2020-11-14 DIAGNOSIS — S43085D Other dislocation of left shoulder joint, subsequent encounter: Secondary | ICD-10-CM | POA: Diagnosis not present

## 2020-11-14 DIAGNOSIS — S43085A Other dislocation of left shoulder joint, initial encounter: Secondary | ICD-10-CM | POA: Diagnosis not present

## 2020-11-21 ENCOUNTER — Other Ambulatory Visit: Payer: Self-pay | Admitting: Adult Health

## 2020-11-21 DIAGNOSIS — J4520 Mild intermittent asthma, uncomplicated: Secondary | ICD-10-CM

## 2020-11-21 DIAGNOSIS — I10 Essential (primary) hypertension: Secondary | ICD-10-CM

## 2020-11-23 ENCOUNTER — Ambulatory Visit: Payer: Medicare Other

## 2020-11-23 DIAGNOSIS — R2689 Other abnormalities of gait and mobility: Secondary | ICD-10-CM | POA: Diagnosis not present

## 2020-11-23 DIAGNOSIS — R2681 Unsteadiness on feet: Secondary | ICD-10-CM | POA: Diagnosis not present

## 2020-11-23 DIAGNOSIS — R296 Repeated falls: Secondary | ICD-10-CM | POA: Diagnosis not present

## 2020-11-23 DIAGNOSIS — R279 Unspecified lack of coordination: Secondary | ICD-10-CM | POA: Diagnosis not present

## 2020-11-24 ENCOUNTER — Ambulatory Visit (INDEPENDENT_AMBULATORY_CARE_PROVIDER_SITE_OTHER): Payer: Medicare Other | Admitting: Family

## 2020-11-24 ENCOUNTER — Encounter: Payer: Self-pay | Admitting: Family

## 2020-11-24 ENCOUNTER — Other Ambulatory Visit: Payer: Self-pay

## 2020-11-24 DIAGNOSIS — Z0000 Encounter for general adult medical examination without abnormal findings: Secondary | ICD-10-CM | POA: Diagnosis not present

## 2020-11-24 DIAGNOSIS — Z780 Asymptomatic menopausal state: Secondary | ICD-10-CM

## 2020-11-24 DIAGNOSIS — Z23 Encounter for immunization: Secondary | ICD-10-CM

## 2020-11-24 MED ORDER — TETANUS-DIPHTH-ACELL PERTUSSIS 5-2.5-18.5 LF-MCG/0.5 IM SUSP: 0.5000 mL | Freq: Once | INTRAMUSCULAR | 0 refills | Status: AC

## 2020-11-24 NOTE — Progress Notes (Signed)
Subjective:   Amy Andrade is a 85 y.o. female who presents for Medicare Annual (Subsequent) preventive examination.  Review of Systems    Cardiac Risk Factors include: advanced age (>93men, >47 women);hypertension;dyslipidemia;smoking/ tobacco exposure     Objective:    Today's Vitals   11/24/20 1326  PainSc: 3    There is no height or weight on file to calculate BMI.  Advanced Directives 11/24/2020 11/03/2020 10/07/2020 08/31/2020 04/25/2020 12/09/2019 11/23/2019  Does Patient Have a Medical Advance Directive? Yes Yes Yes No Yes Yes Yes  Type of Estate agent of [employer] Power of [place];Living will - - Public affairs consultant of [employer] Power of [employer] Power of [place];Living will  Does patient want to make changes to medical advance directive? No - Patient declined No - Patient declined - - No - Guardian declined No - Patient declined No - Patient declined  Copy of Healthcare Power of Attorney in Chart? Yes - validated most recent copy scanned in chart (See row information) No - copy requested - - - Yes - validated most recent copy scanned in chart (See row information) Yes - validated most recent copy scanned in chart (See row information)  Would patient like information on creating a medical advance directive? - - - No - Patient declined - - -    Current Medications (verified) Outpatient Encounter Medications as of 11/24/2020  Medication Sig  . acetaminophen (TYLENOL) 500 MG tablet Take 500 mg by mouth every 6 (six) hours as needed for fever (shoulder pain).  Marland Kitchen amLODipine (NORVASC) 10 MG tablet TAKE 1 TABLET(10 MG) BY MOUTH DAILY  . aspirin 81 MG tablet Take 81 mg by mouth daily.  Marland Kitchen HYDROcodone-acetaminophen (NORCO/VICODIN) 5-325 MG tablet Take 1 tablet by mouth every 12 (twelve) hours as needed for severe pain.  Marland Kitchen irbesartan (AVAPRO) 300 MG tablet Take 1 tablet (300 mg total) by mouth daily.  . metoprolol tartrate (LOPRESSOR) 25  MG tablet Take 1 tablet (25 mg total) by mouth 2 (two) times daily.  . nitroGLYCERIN (NITROSTAT) 0.4 MG SL tablet Place 1 tablet (0.4 mg total) under the tongue every 5 (five) minutes x 3 doses as needed for chest pain.  . pravastatin (PRAVACHOL) 40 MG tablet TAKE 1 TABLET(40 MG) BY MOUTH DAILY   No facility-administered encounter medications on file as of 11/24/2020.    Allergies (verified) Patient has no known allergies.   History: Past Medical History:  Diagnosis Date  . Alzheimer disease (HCC)   . Asthma   . Hyperlipidemia   . Hypertension   . Pulmonary embolism (HCC) 2004   was short of breath--was on coumadin for 6 mos to a year   Past Surgical History:  Procedure Laterality Date  . ABDOMINAL HYSTERECTOMY    . BUNIONECTOMY    . REPLACEMENT TOTAL KNEE  1980s, 2003   x2   Family History  Problem Relation Age of Onset  . Alzheimer's disease Mother   . Chronic bronchitis Mother   . Heart disease Father   . High blood pressure Father   . High blood pressure Brother   . Asthma Daughter   . High blood pressure Son   . High Cholesterol Son   . Post-traumatic stress disorder Son    Social History   Socioeconomic History  . Marital status: Widowed    Spouse name: Not on file  . Number of children: 2  . Years of education: Not on file  . Highest education level: Not  on file  Occupational History  . Not on file  Tobacco Use  . Smoking status: Former Smoker    Packs/day: 0.25    Years: 4.00    Pack years: 1.00    Types: Cigarettes    Quit date: 09/17/1957    Years since quitting: 63.2  . Smokeless tobacco: Never Used  Vaping Use  . Vaping Use: Never used  Substance and Sexual Activity  . Alcohol use: Yes    Alcohol/week: 2.0 standard drinks    Types: 2 Glasses of wine per week    Comment: 1 4oz cup with meals   . Drug use: No  . Sexual activity: Not Currently  Other Topics Concern  . Not on file  Social History Narrative   Diet: Eats well      Do you  drink/ eat things with caffeine?Yes, 2 cups of coffee a day      Marital status:    Married--widowed in 2017                           What year were you married ? 1966      Do you live in a house, apartment,assistred living, condo, trailer, etc.)? House      Is it one or more stories? 2      How many persons live in your home ?  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  Yes                            Type & how often: Stationary bike 2 x/week      Do you have a living will? Yes      Do you have a DNR form?  Yes                     If not, do you want to discuss one?       Do you have signed POA?HPOA forms?   Yes              If so, please bring to your        appointment      Social Determinants of Health   Financial Resource Strain: Not on file  Food Insecurity: Not on file  Transportation Needs: Not on file  Physical Activity: Not on file  Stress: Not on file  Social Connections: Not on file    Tobacco Counseling Counseling given: Not Answered   Clinical Intake:  Pre-visit preparation completed: No  Pain : 0-10 Pain Score: 3  Pain Type: Chronic pain Pain Location: Shoulder Pain Orientation: Left Pain Radiating Towards: none Pain Descriptors / Indicators: Aching Pain Onset: Other (comment) (one year an half) Pain Frequency: Constant Pain Relieving Factors: cortisol injection Effect of Pain on Daily Activities: Yes cannot lift arm  Pain Relieving Factors: cortisol injection  BMI - recorded: 26 Nutritional Status: BMI 25 -29 Overweight Nutritional Risks: None Diabetes: No  How often do you need to have someone help you when you read instructions, pamphlets, or other written materials from your doctor or pharmacy?: 1 - Never What is the last grade level you completed in school?: Phd  Diabetic?No   Interpreter Needed?: No  Information entered by ::  Charlyne Robertshaw,FNP-C   Activities of Daily Living In your present state of health, do you have any difficulty performing the following activities: 11/24/2020 09/03/2020  Hearing? N N  Vision? N N  Difficulty concentrating or making decisions? Y Y  Comment short term memory loss -  Walking or climbing stairs? Y Y  Comment uses a walker -  Dressing or bathing? Malvin Johns  Comment needs assist -  Doing errands, shopping? Malvin Johns  Comment POA assist -  Preparing Food and eating ? Y -  Comment daughter in law assist -  Using the Toilet? N -  In the past six months, have you accidently leaked urine? Y -  Do you have problems with loss of bowel control? N -  Managing your Medications? N -  Managing your Finances? N -  Housekeeping or managing your Housekeeping? Y -  Comment family assist -  Some recent data might be hidden    Patient Care Team: Kermit Balo, DO as PCP - General (Geriatric Medicine) Chalmers Guest, MD as Consulting Physician (Ophthalmology)  Indicate any recent Medical Services you may have received from other than [facility] providers in the past year (date may be approximate).     Assessment:   This is a routine wellness examination for Karita.  Hearing/Vision screen  Hearing Screening   125Hz  250Hz  500Hz  1000Hz  2000Hz  3000Hz  4000Hz  6000Hz  8000Hz   Right ear:           Left ear:           Comments: No Hearing Concerns.   Vision Screening Comments: No Vision Concerns. Last eye exam was years ago. Patient wears prescription glasses.   Dietary issues and exercise activities discussed: Current Exercise Habits: The patient does not participate in regular exercise at present, Exercise limited by: None identified  Goals   None    Depression Screen PHQ 2/9 Scores 11/24/2020 12/09/2019 11/23/2019 11/18/2017 10/22/2016 06/18/2016 01/20/2016  PHQ - 2 Score 0 0 0 1 0 0 0    Fall Risk Fall Risk  11/24/2020 11/03/2020 04/25/2020 12/09/2019 11/23/2019  Falls in the past year? 1 1 0 0 0  Number falls  in past yr: 0 0 0 0 0  Comment - knee - - -  Injury with Fall? 1 0 0 0 0    FALL RISK PREVENTION PERTAINING TO THE HOME:  Any stairs in or around the home? No  If so, are there any without handrails? No  Home free of loose throw rugs in walkways, pet beds, electrical cords, etc? No  Adequate lighting in your home to reduce risk of falls? Yes   ASSISTIVE DEVICES UTILIZED TO PREVENT FALLS:  Life alert? Yes  Use of a cane, walker or w/c? Yes  Grab bars in the bathroom? Yes  Shower chair or bench in shower? Yes  Elevated toilet seat or a handicapped toilet? Yes   TIMED UP AND GO:  Was the test performed? No .  Length of time to ambulate 10 feet: N/A  sec.   Gait slow and steady with assistive device  Cognitive Function: MMSE - Mini Mental State Exam 11/18/2017 06/18/2016 04/21/2015  Orientation to time 2 3 2   Orientation to Place 5 5 5   Registration 3 3 3   Attention/ Calculation 5 5 5   Recall 1 3 1   Language- name 2 objects 2 2 2   Language- repeat 1 1 1   Language- follow 3 step command 3 3 3   Language- read & follow  direction 1 1 1   Write a sentence 1 1 1   Copy design 1 1 1   Total score 25 28 25      6CIT Screen 11/24/2020 11/23/2019  What Year? 0 points 4 points  What month? 0 points 3 points  What time? 0 points 0 points  Count back from 20 0 points 0 points  Months in reverse 0 points 0 points  Repeat phrase 10 points 0 points  Total Score 10 7    Immunizations Immunization History  Administered Date(s) Administered  . Influenza, High Dose Seasonal PF 10/27/2018, 07/18/2020  . Influenza,inj,Quad PF,6+ Mos 05/27/2015, 06/18/2016  . Influenza-Unspecified 05/18/2017  . Pneumococcal Conjugate-13 10/11/2014  . Pneumococcal Polysaccharide-23 01/20/2016  . Tdap 09/17/2010  . Zoster 07/22/2014    TDAP status: Due, Education has been provided regarding the importance of this vaccine. Advised may receive this vaccine at local pharmacy or Health Dept. Aware to provide a  copy of the vaccination record if obtained from local pharmacy or Health Dept. Verbalized acceptance and understanding.  Flu Vaccine status: Up to date  Pneumococcal vaccine status: Up to date  Covid-19 vaccine status: Information provided on how to obtain vaccines.   Qualifies for Shingles Vaccine? Yes   Zostavax completed Yes   Shingrix Completed?: No.    Education has been provided regarding the importance of this vaccine. Patient has been advised to call insurance company to determine out of pocket expense if they have not yet received this vaccine. Advised may also receive vaccine at local pharmacy or Health Dept. Verbalized acceptance and understanding.  Screening Tests Health Maintenance  Topic Date Due  . COVID-19 Vaccine (1) Never done  . TETANUS/TDAP  12/15/2020 (Originally 09/17/2020)  . INFLUENZA VACCINE  Completed  . DEXA SCAN  Completed  . PNA vac Low Risk Adult  Completed  . HPV VACCINES  Aged Out    Health Maintenance  Health Maintenance Due  Topic Date Due  . COVID-19 Vaccine (1) Never done    Colorectal cancer screening: No longer required.   Mammogram status: No longer required due to advance age .  Bone Density status: Ordered 11/24/2020. Pt provided with contact info and advised to call to schedule appt.  Lung Cancer Screening: (Low Dose CT Chest recommended if Age 56-80 years, 30 pack-year currently smoking OR have quit w/in 15years.) does not qualify.   Lung Cancer Screening Referral: No    Additional Screening:  Hepatitis C Screening: does not qualify; Completed No   Vision Screening: Recommended annual ophthalmology exams for early detection of glaucoma and other disorders of the eye. Is the patient up to date with their annual eye exam?  Yes  Who is the provider or what is the name of the office in which the patient attends annual eye exams? Dr.Weaver  If pt is not established with a provider, would they like to be referred to a provider to  establish care? No .   Dental Screening: Recommended annual dental exams for proper oral hygiene  Community Resource Referral / Chronic Care Management: CRR required this visit?  No   CCM required this visit?  No      Plan:   - Tdap vaccine script send to pharmacy  - Advised to get Shingrix vaccine at the Pharmacy  - Bone density   I have personally reviewed and noted the following in the patient's chart:   . Medical and social history . Use of alcohol, tobacco or illicit drugs  . Current medications  and supplements . Functional ability and status . Nutritional status . Physical activity . Advanced directives . List of other physicians . Hospitalizations, surgeries, and ER visits in previous 12 months . Vitals . Screenings to include cognitive, depression, and falls . Referrals and appointments  In addition, I have reviewed and discussed with patient certain preventive protocols, quality metrics, and best practice recommendations. A written personalized care plan for preventive services as well as general preventive health recommendations were provided to patient.    Caesar Bookman, NP   11/24/2020   Nurse Notes:Advised to get Tdap vaccine at her pharmacy.script send to Pharmacy.

## 2020-11-24 NOTE — Progress Notes (Signed)
    This service is provided via telemedicine  No vital signs collected/recorded due to the encounter was a telemedicine visit.   Location of patient (ex: home, work): Home.   Patient consents to a telephone visit: Yes.  Location of the provider (ex: office, home):  [facility].  Name of any referring provider: Reed, Tiffany L, DO   Names of all persons participating in the telemedicine service and their role in the encounter: Patient, Kylle Lall, RMA, Ngetich, Dinah, NP.    Time spent on call: 8 minutes spent on the phone with Medical Assistant.   

## 2020-11-24 NOTE — Patient Instructions (Signed)
Amy Andrade , Thank you for taking time to come for your Medicare Wellness Visit. I appreciate your ongoing commitment to your health goals. Please review the following plan we discussed and let me know if I can assist you in the future.   Screening recommendations/referrals: Colonoscopy: N/A  Mammogram: N/A  Bone Density: ordered today [facility] will call you to schedule appointment  Recommended yearly ophthalmology/optometry visit for glaucoma screening and checkup Recommended yearly dental visit for hygiene and checkup  Vaccinations: Influenza vaccine: Up to date  Pneumococcal vaccine : Up to date  Tdap vaccine : Up to date  Shingles vaccine: Please get shingrix vaccine at your pharmacy     Advanced directives: Yes   Conditions/risks identified: Advance age female > 56 yrs,Hypertension,dyslipidemia,Hx of smoking   Next appointment: 1 year    Preventive Care 83 Years and Older, Female Preventive care refers to lifestyle choices and visits with your health care provider that can promote health and wellness. What does preventive care include?  A yearly physical exam. This is also called an annual well check.  Dental exams once or twice a year.  Routine eye exams. Ask your health care provider how often you should have your eyes checked.  Personal lifestyle choices, including:  Daily care of your teeth and gums.  Regular physical activity.  Eating a healthy diet.  Avoiding tobacco and drug use.  Limiting alcohol use.  Practicing safe sex.  Taking low-dose aspirin every day.  Taking vitamin and mineral supplements as recommended by your health care provider. What happens during an annual well check? The services and screenings done by your health care provider during your annual well check will depend on your age, overall health, lifestyle risk factors, and family history of disease. Counseling  Your health care provider may ask you questions about  your:  Alcohol use.  Tobacco use.  Drug use.  Emotional well-being.  Home and relationship well-being.  Sexual activity.  Eating habits.  History of falls.  Memory and ability to understand (cognition).  Work and work Astronomer.  Reproductive health. Screening  You may have the following tests or measurements:  Height, weight, and BMI.  Blood pressure.  Lipid and cholesterol levels. These may be checked every 5 years, or more frequently if you are over 33 years old.  Skin check.  Lung cancer screening. You may have this screening every year starting at age 18 if you have a 30-pack-year history of smoking and currently smoke or have quit within the past 15 years.  Fecal occult blood test (FOBT) of the stool. You may have this test every year starting at age 65.  Flexible sigmoidoscopy or colonoscopy. You may have a sigmoidoscopy every 5 years or a colonoscopy every 10 years starting at age 32.  Hepatitis C blood test.  Hepatitis B blood test.  Sexually transmitted disease (STD) testing.  Diabetes screening. This is done by checking your blood sugar (glucose) after you have not eaten for a while (fasting). You may have this done every 1-3 years.  Bone density scan. This is done to screen for osteoporosis. You may have this done starting at age 15.  Mammogram. This may be done every 1-2 years. Talk to your health care provider about how often you should have regular mammograms. Talk with your health care provider about your test results, treatment options, and if necessary, the need for more tests. Vaccines  Your health care provider may recommend certain vaccines, such as:  Influenza vaccine. This is recommended every year.  Tetanus, diphtheria, and acellular pertussis (Tdap, Td) vaccine. You may need a Td booster every 10 years.  Zoster vaccine. You may need this after age 73.  Pneumococcal 13-valent conjugate (PCV13) vaccine. One dose is recommended  after age 68.  Pneumococcal polysaccharide (PPSV23) vaccine. One dose is recommended after age 75. Talk to your health care provider about which screenings and vaccines you need and how often you need them. This information is not intended to replace advice given to you by your health care provider. Make sure you discuss any questions you have with your health care provider. Document Released: 09/30/2015 Document Revised: 05/23/2016 Document Reviewed: 07/05/2015 Elsevier Interactive Patient Education  2017 [place] Prevention in the Home Falls can cause injuries. They can happen to people of all ages. There are many things you can do to make your home safe and to help prevent falls. What can I do on the outside of my home?  Regularly fix the edges of walkways and driveways and fix any cracks.  Remove anything that might make you trip as you walk through a door, such as a raised step or threshold.  Trim any bushes or trees on the path to your home.  Use bright outdoor lighting.  Clear any walking paths of anything that might make someone trip, such as rocks or tools.  Regularly check to see if handrails are loose or broken. Make sure that both sides of any steps have handrails.  Any raised decks and porches should have guardrails on the edges.  Have any leaves, snow, or ice cleared regularly.  Use sand or salt on walking paths during winter.  Clean up any spills in your garage right away. This includes oil or grease spills. What can I do in the bathroom?  Use night lights.  Install grab bars by the toilet and in the tub and shower. Do not use towel bars as grab bars.  Use non-skid mats or decals in the tub or shower.  If you need to sit down in the shower, use a plastic, non-slip stool.  Keep the floor dry. Clean up any water that spills on the floor as soon as it happens.  Remove soap buildup in the tub or shower regularly.  Attach bath mats securely with  double-sided non-slip rug tape.  Do not have throw rugs and other things on the floor that can make you trip. What can I do in the bedroom?  Use night lights.  Make sure that you have a light by your bed that is easy to reach.  Do not use any sheets or blankets that are too big for your bed. They should not hang down onto the floor.  Have a firm chair that has side arms. You can use this for support while you get dressed.  Do not have throw rugs and other things on the floor that can make you trip. What can I do in the kitchen?  Clean up any spills right away.  Avoid walking on wet floors.  Keep items that you use a lot in easy-to-reach places.  If you need to reach something above you, use a strong step stool that has a grab bar.  Keep electrical cords out of the way.  Do not use floor polish or wax that makes floors slippery. If you must use wax, use non-skid floor wax.  Do not have throw rugs and other things on the floor that  can make you trip. What can I do with my stairs?  Do not leave any items on the stairs.  Make sure that there are handrails on both sides of the stairs and use them. Fix handrails that are broken or loose. Make sure that handrails are as long as the stairways.  Check any carpeting to make sure that it is firmly attached to the stairs. Fix any carpet that is loose or worn.  Avoid having throw rugs at the top or bottom of the stairs. If you do have throw rugs, attach them to the floor with carpet tape.  Make sure that you have a light switch at the top of the stairs and the bottom of the stairs. If you do not have them, ask someone to add them for you. What else can I do to help prevent falls?  Wear shoes that:  Do not have high heels.  Have rubber bottoms.  Are comfortable and fit you well.  Are closed at the toe. Do not wear sandals.  If you use a stepladder:  Make sure that it is fully opened. Do not climb a closed stepladder.  Make  sure that both sides of the stepladder are locked into place.  Ask someone to hold it for you, if possible.  Clearly mark and make sure that you can see:  Any grab bars or handrails.  First and last steps.  Where the edge of each step is.  Use tools that help you move around (mobility aids) if they are needed. These include:  Canes.  Walkers.  Scooters.  Crutches.  Turn on the lights when you go into a dark area. Replace any light bulbs as soon as they burn out.  Set up your furniture so you have a clear path. Avoid moving your furniture around.  If any of your floors are uneven, fix them.  If there are any pets around you, be aware of where they are.  Review your medicines with your doctor. Some medicines can make you feel dizzy. This can increase your chance of falling. Ask your doctor what other things that you can do to help prevent falls. This information is not intended to replace advice given to you by your health care provider. Make sure you discuss any questions you have with your health care provider. Document Released: 06/30/2009 Document Revised: 02/09/2016 Document Reviewed: 10/08/2014 Elsevier Interactive Patient Education  2017 [employer].

## 2020-11-26 ENCOUNTER — Other Ambulatory Visit: Payer: Self-pay | Admitting: Internal Medicine

## 2020-11-26 ENCOUNTER — Other Ambulatory Visit: Payer: Self-pay | Admitting: Adult Health

## 2020-11-26 DIAGNOSIS — I10 Essential (primary) hypertension: Secondary | ICD-10-CM

## 2020-12-03 ENCOUNTER — Ambulatory Visit: Payer: Medicare Other | Admitting: Physical Therapy

## 2020-12-08 ENCOUNTER — Encounter: Payer: Self-pay | Admitting: Physical Therapy

## 2020-12-08 ENCOUNTER — Ambulatory Visit: Payer: Medicare Other | Attending: Orthopedic Surgery | Admitting: Physical Therapy

## 2020-12-08 ENCOUNTER — Other Ambulatory Visit: Payer: Self-pay

## 2020-12-08 DIAGNOSIS — M6281 Muscle weakness (generalized): Secondary | ICD-10-CM | POA: Insufficient documentation

## 2020-12-08 DIAGNOSIS — R2689 Other abnormalities of gait and mobility: Secondary | ICD-10-CM | POA: Insufficient documentation

## 2020-12-08 NOTE — Therapy (Signed)
[facility] [address] [place], [place], [address] Phone: [phone]   Fax:  [phone]  Physical Therapy Evaluation  Patient Details  Name: Amy Andrade MRN: [id number] Date of Birth: [birth year] Referring Provider (PT): Dannielle Huh, MD   Encounter Date: 12/08/2020   PT End of Session - 12/08/20 1619    Visit Number 1    Number of Visits 12    Date for PT Re-Evaluation 01/19/21    Authorization Type UHC MCR, progress note visit 10    PT Start Time 1417    PT Stop Time 1500    PT Time Calculation (min) 43 min    Equipment Utilized During Treatment Gait belt    Activity Tolerance Patient tolerated treatment well    Behavior During Therapy [facility] for tasks assessed/performed           Past Medical History:  Diagnosis Date  . Alzheimer disease (HCC)   . Asthma   . Hyperlipidemia   . Hypertension   . Pulmonary embolism (HCC) 2004   was short of breath--was on coumadin for 6 mos to a year    Past Surgical History:  Procedure Laterality Date  . ABDOMINAL HYSTERECTOMY    . BUNIONECTOMY    . REPLACEMENT TOTAL KNEE  1980s, 2003   x2    There were no vitals filed for this visit.    Subjective Assessment - 12/08/20 1510    Subjective Pt. is a 85 y/o female referred to PT s/p left periprosthetic distal femur fracture which occured secondary to fall last December. Fracture was managed non-operatively-pt. was initially discarged to SNF from acute care but since has returned home and is living with son and daughter-in-law. See PMH which is significant for Alzheimer's. Pt. requires w/c use for community mobility but has been able to use RW at home. Of note pt. has chronic left shoulder pain issues with underlying OA and rotator cuff tears and her son reports she has had issues with chronic/frequent dislocations and is considering TSA to address.    Patient is accompained by: Family member   accompanied by son   Pertinent  History Alzheimer's/dementia, bilat. TKA, PE, HTN, HLD, asthma    Limitations Standing;Walking;House hold activities;Lifting    Diagnostic tests X-rays    Currently in Pain? Yes    Pain Score 2     Pain Location Shoulder    Pain Orientation Left    Pain Descriptors / Indicators Dull;Aching    Pain Type Chronic pain    Pain Onset More than a month ago    Pain Frequency Constant    Aggravating Factors  reaching/arm use    Pain Relieving Factors rest, injections    Effect of Pain on Daily Activities increased difficulty with reaching ADLs              [facility] PT Assessment - 12/08/20 0001      Assessment   Medical Diagnosis Closed fx. of left distal femur, history of TKA, gait, strength    Referring Provider (PT) Dannielle Huh, MD    Onset Date/Surgical Date 08/31/20    Prior Therapy initial acute care PT follow by SNF and home health therapy, briefly did outpatient therapy at another facility last month      Precautions   Precautions Fall      Restrictions   Weight Bearing Restrictions No      Balance Screen   Has the patient fallen in the past 6 months Yes  How many times? 1   with injury last December     Home Environment   Living Environment Private residence    Living Arrangements Children   lives with son and daughter in law   Type of Home House    Home Access Ramped entrance    Home Layout Two level    Alternate Level Stairs-Number of Steps 2 story home but pt. does not use stairs/lives on first floor    Home Equipment Walker - 2 wheels;Bedside commode;Shower seat;Toilet riser;Grab bars - toilet;Wheelchair - manual      Prior Function   Level of Independence Needs assistance with ADLs;Needs assistance with homemaking;Independent with community mobility with device      Cognition   Overall Cognitive Status History of cognitive impairments - at baseline      Observation/Other Assessments   Focus on Therapeutic Outcomes (FOTO)  not tested due to dementia       ROM / Strength   AROM / PROM / Strength AROM;Strength      AROM   Overall AROM Comments Bilat. hip AROM/PROM grossly WFL    AROM Assessment Site Knee    Right/Left Knee Right;Left    Right Knee Extension 4   lacking 4 deg from neutral   Right Knee Flexion 126    Left Knee Extension 2   lacking 2 dge from neutral   Left Knee Flexion 123      Strength   Strength Assessment Site Hip;Knee;Ankle    Right/Left Hip Right;Left    Right Hip Flexion 4+/5    Right Hip External Rotation  4/5    Right Hip Internal Rotation 4+/5    Left Hip Flexion 4+/5    Left Hip External Rotation 4/5    Left Hip Internal Rotation 4/5    Right/Left Knee Right;Left    Right Knee Flexion 5/5    Right Knee Extension 5/5    Left Knee Flexion 5/5    Left Knee Extension 4+/5    Right/Left Ankle Right;Left    Right Ankle Dorsiflexion 4+/5    Right Ankle Inversion 4+/5    Right Ankle Eversion 4/5    Left Ankle Dorsiflexion 4/5    Left Ankle Inversion 4/5    Left Ankle Eversion 4/5      Flexibility   Soft Tissue Assessment /Muscle Length --   mild right piriformis tightness, SLR 80 deg bilat.     Bed Mobility   Bed Mobility Supine to Sit;Sit to Supine    Supine to Sit Other (comment)   mod I due to increased time but able without assistance   Sit to Supine Other (comment)   mod I due to increased time but able without assistance     Transfers   Transfers Stand to Sit;Sit to Stand    Sit to Stand 6: Modified independent (Device/Increase time)    Five time sit to stand comments  18 seconds    Stand to Sit 6: Modified independent (Device/Increase time)      Ambulation/Gait   Gait Comments Tinetti Gait + Balance 13/28, pt. uses w/c for community mobility, she is able to walk household distances with RW-today demos decreased bilateral steplength and decreased stance time on RLE and decreased cadence   supervision to SBA for safety with gait     Balance   Balance Assessed Yes      Standardized Balance  Assessment   Standardized Balance Assessment Five Times Sit to Stand;Timed Up and Go Test  Timed Up and Go Test   Normal TUG (seconds) 49   with RW use and SBA for safety                     Objective measurements completed on examination: See above findings.       [facility] Adult PT Treatment/Exercise - 12/08/20 0001      Exercises   Exercises --   brief HEP handout review with pt./son and issued green Theraband                 PT Education - 12/08/20 1619    Education Details eval findings, HEP, POC    Person(s) Educated Patient    Methods Explanation;Demonstration;Verbal cues;Handout    Comprehension Verbalized understanding            PT Short Term Goals - 12/08/20 1628      PT SHORT TERM GOAL #1   Title Independent with initial HEP    Baseline needs HEP    Time 3    Period Weeks    Status New    Target Date 12/29/20      PT SHORT TERM GOAL #2   Title Improve 5 times sit<>stand to 15 seconds or less for improved ability for transfers    Baseline 18 seconds    Time 3    Period Weeks    Status New    Target Date 12/29/20             PT Long Term Goals - 12/08/20 1629      PT LONG TERM GOAL #1   Title Improve Tinetti Gait + Balance score at least 3-5 points to work towards decreased fall risk    Baseline 13/28    Time 6    Period Weeks    Status New    Target Date 01/19/21      PT LONG TERM GOAL #2   Title Increase left knee extension/quadricep strength to 5/5 to improve ability for transfers from low seats and improve stability for ambulation    Baseline 4+/5    Time 6    Period Weeks    Status New    Target Date 01/19/21      PT LONG TERM GOAL #3   Title Improve TUG time at least 10 seconds or greater from baseline stauts for improved gait speed to work towards decreased fall risk    Baseline 49 seconds    Time 6    Period Weeks    Status New    Target Date 01/19/21      PT LONG TERM GOAL #4   Title Pt./family to  be independent with advanced HEP for continued progression after d/c from formal therapy    Baseline will add/update as appropriate pending progress    Time 6    Period Weeks    Status New    Target Date 01/19/21                  Plan - 12/08/20 1620    Clinical Impression Statement Pt. presents >4 months s/p left distal femur fracture with LE muscle weakness, decreased balance and associated gait impairments. Scores today for TUG and Tinetti Gait + Balance show high fall risk. No significant LLE pain with left shoulder as primary body region with current pain symptoms. Status complicated by factors including pt. age and medical comorbidities including dementia/Alzheimer's. Pt. would benefit from PT to work on improving LE strength and  addressing gait/balance impairments to improve safety with mobility and work towards decreased fall risk.    Personal Factors and Comorbidities Comorbidity 3+    Comorbidities Alzheimer's, TKA, asthma, HTN-see PMH for further detials    Examination-Activity Limitations Stand;Stairs;Locomotion Level;Lift;Squat;Transfers    Examination-Participation Restrictions Community Activity;Shop;Cleaning;Meal Prep    Stability/Clinical Decision Making Evolving/Moderate complexity    Clinical Decision Making Moderate    Rehab Potential Good    PT Frequency 2x / week    PT Duration 6 weeks    PT Treatment/Interventions ADLs/Self Care Home Management;Cryotherapy;Moist Heat;Gait training;Stair training;Therapeutic exercise;Patient/family education;Balance training;Functional mobility training;Neuromuscular re-education;Therapeutic activities;Manual techniques    PT Next Visit Plan caution with left shoulder due to chronic dislocations per son, review HEP as needed (family will assist HEP at home), as tolerated work on LE strengthening with open and closed chain strengthening, work on balance and gait training with RW    PT Home Exercise Plan Access code: [email or web address]     Consulted and Agree with Plan of Care Patient;Family member/caregiver           Patient will benefit from skilled therapeutic intervention in order to improve the following deficits and impairments:  Abnormal gait,Decreased balance,Difficulty walking,Decreased safety awareness,Decreased activity tolerance,Decreased strength,Decreased endurance  Visit Diagnosis: Other abnormalities of gait and mobility  Muscle weakness (generalized)     Problem List Patient Active Problem List   Diagnosis Date Noted  . Leukopenia 09/08/2020  . Unspecified protein-calorie malnutrition (HCC) 09/08/2020  . Neurocognitive deficits 09/08/2020  . Femur fracture, left (HCC) 09/01/2020  . Closed fracture of left distal femur (HCC) 08/31/2020  . Coronavirus infection 08/31/2020  . Chronic left shoulder pain 04/25/2020  . Chronic bilateral low back pain without sciatica 04/25/2020  . Uncomplicated alcohol dependence (HCC) 04/25/2020  . Hyponatremia 05/31/2016  . Incidental lung nodule 05/30/2016  . History of pulmonary embolism 05/30/2016  . Alzheimer disease (HCC)   . Asthma   . Hyperlipidemia   . Hypertension   . Skin lesion of chest wall 06/22/2011    Lazarus Gowda, PT, DPT 12/08/20 4:36 PM  [facility] [address] [place], [place], [address] Phone: [phone]   Fax:  [phone]  Name: Amy Andrade MRN: [id number] Date of Birth: 21-Apr-1936

## 2020-12-12 ENCOUNTER — Ambulatory Visit: Payer: Medicare Other | Admitting: Physical Therapy

## 2020-12-12 ENCOUNTER — Encounter: Payer: Self-pay | Admitting: Physical Therapy

## 2020-12-12 ENCOUNTER — Other Ambulatory Visit: Payer: Self-pay

## 2020-12-12 DIAGNOSIS — R2689 Other abnormalities of gait and mobility: Secondary | ICD-10-CM | POA: Diagnosis not present

## 2020-12-12 DIAGNOSIS — M6281 Muscle weakness (generalized): Secondary | ICD-10-CM | POA: Diagnosis not present

## 2020-12-12 NOTE — Therapy (Signed)
[facility] [address] [place], [place], [address] Phone: [phone]   Fax:  [phone]  Physical Therapy Treatment  Patient Details  Name: Amy Andrade MRN: [id number] Date of Birth: 12-24-1935 Referring Provider (PT): Dannielle Huh, MD   Encounter Date: 12/12/2020   PT End of Session - 12/12/20 1556    Visit Number 2    Number of Visits 12    Date for PT Re-Evaluation 01/19/21    Authorization Type UHC MCR, progress note visit 10    Progress Note Due on Visit 10    PT Start Time 1555   pt arrived 10 min late   PT Stop Time 1628    PT Time Calculation (min) 33 min    Equipment Utilized During Treatment Gait belt    Activity Tolerance Patient tolerated treatment well    Behavior During Therapy [facility] for tasks assessed/performed           Past Medical History:  Diagnosis Date  . Alzheimer disease (HCC)   . Asthma   . Hyperlipidemia   . Hypertension   . Pulmonary embolism (HCC) 2004   was short of breath--was on coumadin for 6 mos to a year    Past Surgical History:  Procedure Laterality Date  . ABDOMINAL HYSTERECTOMY    . BUNIONECTOMY    . REPLACEMENT TOTAL KNEE  1980s, 2003   x2    There were no vitals filed for this visit.   Subjective Assessment - 12/12/20 1557    Subjective "I am doing pretty good. I think I do the exercise when I remember to do them. I haven't had any falls, I think I use a walker at home."    Currently in Pain? No/denies    Pain Type Chronic pain    Pain Onset More than a month ago    Pain Frequency Intermittent                             [facility] Adult PT Treatment/Exercise - 12/12/20 0001      Exercises   Exercises Knee/Hip      Knee/Hip Exercises: Aerobic   Nustep L5 x 5 min LE only      Knee/Hip Exercises: Seated   Long Arc Quad 2 sets;10 reps;Strengthening;Both   3#   Other Seated Knee/Hip Exercises clamshell 2 x 20 with red theraband    Marching  Both;2 sets;10 reps;Strengthening   with red theraband   Sit to Sand 1 set;10 reps   2nd set with 4 inch step on table, cues to avoid reaching for RW                 PT Education - 12/12/20 1606    Education Details reviewed HEP    Person(s) Educated Patient    Methods Explanation;Verbal cues;Tactile cues    Comprehension Verbalized understanding;Verbal cues required            PT Short Term Goals - 12/08/20 1628      PT SHORT TERM GOAL #1   Title Independent with initial HEP    Baseline needs HEP    Time 3    Period Weeks    Status New    Target Date 12/29/20      PT SHORT TERM GOAL #2   Title Improve 5 times sit<>stand to 15 seconds or less for improved ability for transfers    Baseline 18 seconds  Time 3    Period Weeks    Status New    Target Date 12/29/20             PT Long Term Goals - 12/08/20 1629      PT LONG TERM GOAL #1   Title Improve Tinetti Gait + Balance score at least 3-5 points to work towards decreased fall risk    Baseline 13/28    Time 6    Period Weeks    Status New    Target Date 01/19/21      PT LONG TERM GOAL #2   Title Increase left knee extension/quadricep strength to 5/5 to improve ability for transfers from low seats and improve stability for ambulation    Baseline 4+/5    Time 6    Period Weeks    Status New    Target Date 01/19/21      PT LONG TERM GOAL #3   Title Improve TUG time at least 10 seconds or greater from baseline stauts for improved gait speed to work towards decreased fall risk    Baseline 49 seconds    Time 6    Period Weeks    Status New    Target Date 01/19/21      PT LONG TERM GOAL #4   Title Pt./family to be independent with advanced HEP for continued progression after d/c from formal therapy    Baseline will add/update as appropriate pending progress    Time 6    Period Weeks    Status New    Target Date 01/19/21                 Plan - 12/12/20 1616    Clinical Impression  Statement pt arrived 10 min late today. Reviewed HEP which she required verbal cues / for proper form. cotninued working on hip/ knee strengthening with both OK[facility] and [facility] which she did well with. Wth sit to stand from lower table she demonstrated difficulty and was able to perform better with 4 inch step on the table. she noted no pain or issues following session. Advised for them to bring the RW with them next time to practice in the clinic.    PT Treatment/Interventions ADLs/Self Care Home Management;Cryotherapy;Moist Heat;Gait training;Stair training;Therapeutic exercise;Patient/family education;Balance training;Functional mobility training;Neuromuscular re-education;Therapeutic activities;Manual techniques    PT Next Visit Plan caution with left shoulder due to chronic dislocations per son, review HEP as needed (family will assist HEP at home), as tolerated work on LE strengthening with open and closed chain strengthening, work on balance and gait training with RW    PT Home Exercise Plan Access code: [email or web address]           Patient will benefit from skilled therapeutic intervention in order to improve the following deficits and impairments:  Abnormal gait,Decreased balance,Difficulty walking,Decreased safety awareness,Decreased activity tolerance,Decreased strength,Decreased endurance  Visit Diagnosis: Other abnormalities of gait and mobility  Muscle weakness (generalized)     Problem List Patient Active Problem List   Diagnosis Date Noted  . Leukopenia 09/08/2020  . Unspecified protein-calorie malnutrition (HCC) 09/08/2020  . Neurocognitive deficits 09/08/2020  . Femur fracture, left (HCC) 09/01/2020  . Closed fracture of left distal femur (HCC) 08/31/2020  . Coronavirus infection 08/31/2020  . Chronic left shoulder pain 04/25/2020  . Chronic bilateral low back pain without sciatica 04/25/2020  . Uncomplicated alcohol dependence (HCC) 04/25/2020  . Hyponatremia 05/31/2016  .  Incidental lung nodule 05/30/2016  . History of pulmonary embolism  05/30/2016  . Alzheimer disease (HCC)   . Asthma   . Hyperlipidemia   . Hypertension   . Skin lesion of chest wall 06/22/2011   Lulu Riding PT, DPT, LAT, ATC  12/12/20  4:33 PM      [facility] [address] [place], [place], [address] Phone: [phone]   Fax:  [phone]  Name: Amy Andrade MRN: [id number] Date of Birth: 04-20-1936

## 2020-12-14 ENCOUNTER — Ambulatory Visit: Payer: Medicare Other

## 2020-12-14 ENCOUNTER — Other Ambulatory Visit: Payer: Self-pay

## 2020-12-14 DIAGNOSIS — R2689 Other abnormalities of gait and mobility: Secondary | ICD-10-CM

## 2020-12-14 DIAGNOSIS — M6281 Muscle weakness (generalized): Secondary | ICD-10-CM | POA: Diagnosis not present

## 2020-12-14 NOTE — Therapy (Signed)
[facility] [address] [place], [place], [address] Phone: [phone]   Fax:  [phone]  Physical Therapy Treatment  Patient Details  Name: Amy Andrade MRN: [id number] Date of Birth: 05-01-1936 Referring Provider (PT): Dannielle Huh, MD   Encounter Date: 12/14/2020   PT End of Session - 12/14/20 1450    Visit Number 3    Number of Visits 12    Date for PT Re-Evaluation 01/19/21    Authorization Type UHC MCR, progress note visit 10    Progress Note Due on Visit 10    PT Start Time 1451    PT Stop Time 1531    PT Time Calculation (min) 40 min    Activity Tolerance Patient tolerated treatment well;No increased pain    Behavior During Therapy WFL for tasks assessed/performed           Past Medical History:  Diagnosis Date  . Alzheimer disease (HCC)   . Asthma   . Hyperlipidemia   . Hypertension   . Pulmonary embolism (HCC) 2004   was short of breath--was on coumadin for 6 mos to a year    Past Surgical History:  Procedure Laterality Date  . ABDOMINAL HYSTERECTOMY    . BUNIONECTOMY    . REPLACEMENT TOTAL KNEE  1980s, 2003   x2    There were no vitals filed for this visit.   Subjective Assessment - 12/14/20 1450    Subjective Pt presents to PT with reports of L shoulder and L knee pain. She has been compliant with HEP when she remembers to do them. Pt is ready to begin PT treatment at this time.    Pertinent History Alzheimer's/dementia, bilat. TKA, PE, HTN, HLD, asthma    Currently in Pain? Yes    Pain Score 2     Pain Location Shoulder    Pain Orientation Left    Pain Descriptors / Indicators Aching    Pain Type Chronic pain                             [facility] Adult PT Treatment/Exercise - 12/14/20 0001      Ambulation/Gait   Ambulation/Gait Yes    Ambulation/Gait Assistance 6: Modified independent (Device/Increase time)    Ambulation Distance (Feet) 125 Feet    Assistive device  Rolling walker    Gait Pattern Step-to pattern;Decreased trunk rotation;Antalgic    Gait Comments Verbal and tactile cues for increasing trunk extension and staying within FWW      Knee/Hip Exercises: Seated   Long Arc Quad 2 sets;15 reps   3lbs   Ball Squeeze 2x15 - 5 sec hold    Other Seated Knee/Hip Exercises clamshell 2 x 20 with blue theraband    Marching 2 sets;20 reps   3lbs   Sit to Sand 1 set;10 reps   off mat table w/ 4in step     Knee/Hip Exercises: Supine   Bridges 2 sets;10 reps    Straight Leg Raises 2 sets;10 reps   cues for quad set before lift                 PT Education - 12/14/20 1548    Education Details review of HEP and VCs for staying within FWW during gait    Person(s) Educated Patient    Methods Explanation;Demonstration;Verbal cues    Comprehension Verbalized understanding;Returned demonstration;Need further instruction  PT Short Term Goals - 12/08/20 1628      PT SHORT TERM GOAL #1   Title Independent with initial HEP    Baseline needs HEP    Time 3    Period Weeks    Status New    Target Date 12/29/20      PT SHORT TERM GOAL #2   Title Improve 5 times sit<>stand to 15 seconds or less for improved ability for transfers    Baseline 18 seconds    Time 3    Period Weeks    Status New    Target Date 12/29/20             PT Long Term Goals - 12/08/20 1629      PT LONG TERM GOAL #1   Title Improve Tinetti Gait + Balance score at least 3-5 points to work towards decreased fall risk    Baseline 13/28    Time 6    Period Weeks    Status New    Target Date 01/19/21      PT LONG TERM GOAL #2   Title Increase left knee extension/quadricep strength to 5/5 to improve ability for transfers from low seats and improve stability for ambulation    Baseline 4+/5    Time 6    Period Weeks    Status New    Target Date 01/19/21      PT LONG TERM GOAL #3   Title Improve TUG time at least 10 seconds or greater from baseline  stauts for improved gait speed to work towards decreased fall risk    Baseline 49 seconds    Time 6    Period Weeks    Status New    Target Date 01/19/21      PT LONG TERM GOAL #4   Title Pt./family to be independent with advanced HEP for continued progression after d/c from formal therapy    Baseline will add/update as appropriate pending progress    Time 6    Period Weeks    Status New    Target Date 01/19/21                 Plan - 12/14/20 1543    Clinical Impression Statement Pt was once again able to complete all prescribed exercises with no adverse effect, although she did note some lower back tightness. Therapy today continued to work on increasing bilateral LE strength. She continues to have decreased proximal hip strength which affects transfers and other functional mobility tasks. Pt continues to benefit from skilled PT services and should still be seen per POC as prescribed and progressed as able.    PT Treatment/Interventions ADLs/Self Care Home Management;Cryotherapy;Moist Heat;Gait training;Stair training;Therapeutic exercise;Patient/family education;Balance training;Functional mobility training;Neuromuscular re-education;Therapeutic activities;Manual techniques    PT Next Visit Plan caution with left shoulder due to chronic dislocations per son, review HEP as needed (family will assist HEP at home), as tolerated work on LE strengthening with open and closed chain strengthening, work on balance and gait training with RW    PT Home Exercise Plan Access code: [email or web address]           Patient will benefit from skilled therapeutic intervention in order to improve the following deficits and impairments:  Abnormal gait,Decreased balance,Difficulty walking,Decreased safety awareness,Decreased activity tolerance,Decreased strength,Decreased endurance  Visit Diagnosis: Other abnormalities of gait and mobility  Muscle weakness (generalized)     Problem List Patient Active  Problem List   Diagnosis Date Noted  .  Leukopenia 09/08/2020  . Unspecified protein-calorie malnutrition (HCC) 09/08/2020  . Neurocognitive deficits 09/08/2020  . Femur fracture, left (HCC) 09/01/2020  . Closed fracture of left distal femur (HCC) 08/31/2020  . Coronavirus infection 08/31/2020  . Chronic left shoulder pain 04/25/2020  . Chronic bilateral low back pain without sciatica 04/25/2020  . Uncomplicated alcohol dependence (HCC) 04/25/2020  . Hyponatremia 05/31/2016  . Incidental lung nodule 05/30/2016  . History of pulmonary embolism 05/30/2016  . Alzheimer disease (HCC)   . Asthma   . Hyperlipidemia   . Hypertension   . Skin lesion of chest wall 06/22/2011    Eloy End, PT, DPT 12/14/20 3:49 PM  [facility] [address] [place], [place], [address] Phone: [phone]   Fax:  [phone]  Name: DONYE DAUENHAUER MRN: [id number] Date of Birth: 18-Sep-1935

## 2020-12-21 ENCOUNTER — Encounter: Payer: Self-pay | Admitting: Rehabilitative and Restorative Service Providers"

## 2020-12-21 ENCOUNTER — Other Ambulatory Visit: Payer: Self-pay

## 2020-12-21 ENCOUNTER — Ambulatory Visit: Payer: Medicare Other | Attending: Orthopedic Surgery | Admitting: Rehabilitative and Restorative Service Providers"

## 2020-12-21 DIAGNOSIS — R2689 Other abnormalities of gait and mobility: Secondary | ICD-10-CM | POA: Diagnosis not present

## 2020-12-21 DIAGNOSIS — M6281 Muscle weakness (generalized): Secondary | ICD-10-CM | POA: Insufficient documentation

## 2020-12-21 NOTE — Therapy (Signed)
[facility] Outpatient Rehabilitation [facility] [address] [place], [place], [address] Phone: [phone]   Fax:  [phone]  Physical Therapy Treatment  Patient Details  Name: Amy Andrade MRN: [id number] Date of Birth: 1935-10-15 Referring Provider (PT): Dannielle Huh, MD   Encounter Date: 12/21/2020   PT End of Session - 12/21/20 1652    Visit Number 4    Number of Visits 12    PT Start Time 0442    PT Stop Time 0516    PT Time Calculation (min) 34 min    Activity Tolerance Patient tolerated treatment well;No increased pain    Behavior During Therapy WFL for tasks assessed/performed           Past Medical History:  Diagnosis Date  . Alzheimer disease (HCC)   . Asthma   . Hyperlipidemia   . Hypertension   . Pulmonary embolism (HCC) 2004   was short of breath--was on coumadin for 6 mos to a year    Past Surgical History:  Procedure Laterality Date  . ABDOMINAL HYSTERECTOMY    . BUNIONECTOMY    . REPLACEMENT TOTAL KNEE  1980s, 2003   x2    There were no vitals filed for this visit.   Subjective Assessment - 12/21/20 1703    Subjective I am fine. My pain is the same. Nothing new. Pt late                             [facility] Adult PT Treatment/Exercise - 12/21/20 0001      Knee/Hip Exercises: Standing   Other Standing Knee Exercises STS from EOB with walker in front x 6 with PT verbal cues for eccentric control and PT remaining SBA for safety      Knee/Hip Exercises: Seated   Other Seated Knee/Hip Exercises LAQ x 15 unilat bil; bil toe raise x 20, bil heel raise x 20; seated hip abdct/addct/march combo x 15      Knee/Hip Exercises: Supine   Other Supine Knee/Hip Exercises heel slides x 10, SLR x 15 with PT max verbal cues for knee extension, SLR/hip abdct combo x 15, SAQ unilat and bil x 15; glute set with hip abdct/addct x 20; bridge x 15; ball squeeze x 20; ball squeeze with bridge x 15; quad set bil x 20      Knee/Hip  Exercises: Sidelying   Other Sidelying Knee/Hip Exercises clam shell x 15; hip abdct with knee flexed x 15                  PT Education - 12/21/20 1923    Education Details Caregiver brought in walker to be adjusted; PT looked at pt with walker and discussed with caregiver that the walker is helping her to stand upright and lowering the walker may make her stoop over. therefore, walker remained the same height.    Person(s) Educated Patient;Caregiver(s)    Methods Explanation;Demonstration    Comprehension Verbalized understanding            PT Short Term Goals - 12/21/20 1926      PT SHORT TERM GOAL #1   Title Independent with initial HEP    Status On-going      PT SHORT TERM GOAL #2   Title Improve 5 times sit<>stand to 15 seconds or less for improved ability for transfers    Status On-going  PT Long Term Goals - 12/08/20 1629      PT LONG TERM GOAL #1   Title Improve Tinetti Gait + Balance score at least 3-5 points to work towards decreased fall risk    Baseline 13/28    Time 6    Period Weeks    Status New    Target Date 01/19/21      PT LONG TERM GOAL #2   Title Increase left knee extension/quadricep strength to 5/5 to improve ability for transfers from low seats and improve stability for ambulation    Baseline 4+/5    Time 6    Period Weeks    Status New    Target Date 01/19/21      PT LONG TERM GOAL #3   Title Improve TUG time at least 10 seconds or greater from baseline stauts for improved gait speed to work towards decreased fall risk    Baseline 49 seconds    Time 6    Period Weeks    Status New    Target Date 01/19/21      PT LONG TERM GOAL #4   Title Pt./family to be independent with advanced HEP for continued progression after d/c from formal therapy    Baseline will add/update as appropriate pending progress    Time 6    Period Weeks    Status New    Target Date 01/19/21                 Plan - 12/21/20 1925     Clinical Impression Statement Pt was once again able to complete all prescribed exercises with no adverse effect. Therapy today continued to work on increasing bilateral LE strength. She continues to have decreased proximal hip strength which affects transfers and other functional mobility tasks. Pt continues to benefit from skilled PT services and should still be seen per POC as prescribed and progressed as able.    Rehab Potential Good    PT Frequency 2x / week    PT Duration 6 weeks    PT Treatment/Interventions ADLs/Self Care Home Management;Cryotherapy;Moist Heat;Gait training;Stair training;Therapeutic exercise;Patient/family education;Balance training;Functional mobility training;Neuromuscular re-education;Therapeutic activities;Manual techniques    PT Next Visit Plan caution with left shoulder due to chronic dislocations per son, review HEP as needed (family will assist HEP at home), as tolerated work on LE strengthening with open and closed chain strengthening, work on balance and gait training with RW           Patient will benefit from skilled therapeutic intervention in order to improve the following deficits and impairments:  Abnormal gait,Decreased balance,Difficulty walking,Decreased safety awareness,Decreased activity tolerance,Decreased strength,Decreased endurance  Visit Diagnosis: Muscle weakness (generalized)  Other abnormalities of gait and mobility     Problem List Patient Active Problem List   Diagnosis Date Noted  . Leukopenia 09/08/2020  . Unspecified protein-calorie malnutrition (HCC) 09/08/2020  . Neurocognitive deficits 09/08/2020  . Femur fracture, left (HCC) 09/01/2020  . Closed fracture of left distal femur (HCC) 08/31/2020  . Coronavirus infection 08/31/2020  . Chronic left shoulder pain 04/25/2020  . Chronic bilateral low back pain without sciatica 04/25/2020  . Uncomplicated alcohol dependence (HCC) 04/25/2020  . Hyponatremia 05/31/2016  .  Incidental lung nodule 05/30/2016  . History of pulmonary embolism 05/30/2016  . Alzheimer disease (HCC)   . Asthma   . Hyperlipidemia   . Hypertension   . Skin lesion of chest wall 06/22/2011    Luna Fuse, PT, DPT 12/21/2020, 7:27 PM  [facility] Outpatient Rehabilitation [facility] [address] [place], [place], [address] Phone: [phone]   Fax:  [phone]  Name: Amy Andrade MRN: [id number] Date of Birth: 1936/01/15

## 2020-12-23 ENCOUNTER — Other Ambulatory Visit: Payer: Self-pay | Admitting: Adult Health

## 2020-12-23 DIAGNOSIS — K219 Gastro-esophageal reflux disease without esophagitis: Secondary | ICD-10-CM

## 2020-12-24 DIAGNOSIS — R296 Repeated falls: Secondary | ICD-10-CM | POA: Diagnosis not present

## 2020-12-24 DIAGNOSIS — R279 Unspecified lack of coordination: Secondary | ICD-10-CM | POA: Diagnosis not present

## 2020-12-24 DIAGNOSIS — R2681 Unsteadiness on feet: Secondary | ICD-10-CM | POA: Diagnosis not present

## 2020-12-24 DIAGNOSIS — R2689 Other abnormalities of gait and mobility: Secondary | ICD-10-CM | POA: Diagnosis not present

## 2020-12-26 ENCOUNTER — Ambulatory Visit: Payer: Medicare Other

## 2020-12-26 ENCOUNTER — Other Ambulatory Visit: Payer: Self-pay

## 2020-12-26 DIAGNOSIS — R2689 Other abnormalities of gait and mobility: Secondary | ICD-10-CM | POA: Diagnosis not present

## 2020-12-26 DIAGNOSIS — M6281 Muscle weakness (generalized): Secondary | ICD-10-CM | POA: Diagnosis not present

## 2020-12-26 NOTE — Therapy (Signed)
[facility] Outpatient Rehabilitation [facility] [address] [place], [place], [address] Phone: [phone]   Fax:  [phone]  Physical Therapy Treatment  Patient Details  Name: Amy Andrade MRN: [id number] Date of Birth: 07-16-1936 Referring Provider (PT): Dannielle Huh, MD   Encounter Date: 12/26/2020   PT End of Session - 12/26/20 1621    Visit Number 5    Number of Visits 12    Date for PT Re-Evaluation 01/19/21    Authorization Type [employer] MCR, progress note visit 10    Progress Note Due on Visit 10    PT Start Time 1616    PT Stop Time 1656    PT Time Calculation (min) 40 min    Equipment Utilized During Treatment Gait belt    Activity Tolerance Patient tolerated treatment well;No increased pain    Behavior During Therapy WFL for tasks assessed/performed           Past Medical History:  Diagnosis Date  . Alzheimer disease (HCC)   . Asthma   . Hyperlipidemia   . Hypertension   . Pulmonary embolism (HCC) 2004   was short of breath--was on coumadin for 6 mos to a year    Past Surgical History:  Procedure Laterality Date  . ABDOMINAL HYSTERECTOMY    . BUNIONECTOMY    . REPLACEMENT TOTAL KNEE  1980s, 2003   x2    There were no vitals filed for this visit.   Subjective Assessment - 12/26/20 1616    Subjective Pt presents to PT with no current reports of pain or discomfort. She states she has not been remembering to perform her HEP, but she states she feels like she has been getting around the house a little better. Pt is ready to begin PT treatment at this time.    Currently in Pain? No/denies    Pain Score 0-No pain                             [facility] Adult PT Treatment/Exercise - 12/26/20 0001      Knee/Hip Exercises: Aerobic   Nustep lvl 4 LE only x 5 min while taking subjective      Knee/Hip Exercises: Seated   Long Arc Quad 3 sets;10 reps;Weights    Long Arc Quad Weight 3 lbs.    Sit to Sand 2 sets;10 reps;without  UE support   sitting on 4in step     Knee/Hip Exercises: Supine   Bridges 2 sets;10 reps    Bridges with [employer] 2 sets;10 reps    Straight Leg Raises 2 sets;10 reps;Both    Other Supine Knee/Hip Exercises clamshell 3x15 blue tband    Other Supine Knee/Hip Exercises supine single leg clam 2x10 blue tband                    PT Short Term Goals - 12/21/20 1926      PT SHORT TERM GOAL #1   Title Independent with initial HEP    Status On-going      PT SHORT TERM GOAL #2   Title Improve 5 times sit<>stand to 15 seconds or less for improved ability for transfers    Status On-going             PT Long Term Goals - 12/08/20 1629      PT LONG TERM GOAL #1   Title Improve Tinetti Gait + Balance score at least 3-5  points to work towards decreased fall risk    Baseline 13/28    Time 6    Period Weeks    Status New    Target Date 01/19/21      PT LONG TERM GOAL #2   Title Increase left knee extension/quadricep strength to 5/5 to improve ability for transfers from low seats and improve stability for ambulation    Baseline 4+/5    Time 6    Period Weeks    Status New    Target Date 01/19/21      PT LONG TERM GOAL #3   Title Improve TUG time at least 10 seconds or greater from baseline stauts for improved gait speed to work towards decreased fall risk    Baseline 49 seconds    Time 6    Period Weeks    Status New    Target Date 01/19/21      PT LONG TERM GOAL #4   Title Pt./family to be independent with advanced HEP for continued progression after d/c from formal therapy    Baseline will add/update as appropriate pending progress    Time 6    Period Weeks    Status New    Target Date 01/19/21                 Plan - 12/26/20 1744    Clinical Impression Statement Pt responded well to therapy and was once again able to complete all prescribed exercises with no adverse effect. She was able to progress difficulty of seated and supine strengthening  exercises, but continues to fatigue quickly with standing and longer distance ambulation. Pt also continues to have decreased proximal hip strength which affects transfers and other functional mobility tasks. Pt continues to benefit from skilled PT services and should still be seen per POC as prescribed and progressed as able.    PT Treatment/Interventions ADLs/Self Care Home Management;Cryotherapy;Moist Heat;Gait training;Stair training;Therapeutic exercise;Patient/family education;Balance training;Functional mobility training;Neuromuscular re-education;Therapeutic activities;Manual techniques    PT Next Visit Plan caution with left shoulder due to chronic dislocations per son, review HEP as needed (family will assist HEP at home), as tolerated work on LE strengthening with open and closed chain strengthening, work on balance and gait training with RW    PT Home Exercise Plan Access code: [email or web address]           Patient will benefit from skilled therapeutic intervention in order to improve the following deficits and impairments:  Abnormal gait,Decreased balance,Difficulty walking,Decreased safety awareness,Decreased activity tolerance,Decreased strength,Decreased endurance  Visit Diagnosis: Muscle weakness (generalized)  Other abnormalities of gait and mobility     Problem List Patient Active Problem List   Diagnosis Date Noted  . Leukopenia 09/08/2020  . Unspecified protein-calorie malnutrition (HCC) 09/08/2020  . Neurocognitive deficits 09/08/2020  . Femur fracture, left (HCC) 09/01/2020  . Closed fracture of left distal femur (HCC) 08/31/2020  . Coronavirus infection 08/31/2020  . Chronic left shoulder pain 04/25/2020  . Chronic bilateral low back pain without sciatica 04/25/2020  . Uncomplicated alcohol dependence (HCC) 04/25/2020  . Hyponatremia 05/31/2016  . Incidental lung nodule 05/30/2016  . History of pulmonary embolism 05/30/2016  . Alzheimer disease (HCC)   . Asthma   .  Hyperlipidemia   . Hypertension   . Skin lesion of chest wall 06/22/2011    Eloy End, PT, DPT 12/26/20 5:46 PM  [facility] Outpatient Rehabilitation [facility] [address] [place], [place], [address] Phone: [phone]   Fax:  [phone]  Name: Amy Andrade MRN: [id number] Date of Birth: [birth year]

## 2020-12-28 ENCOUNTER — Other Ambulatory Visit: Payer: Self-pay

## 2020-12-28 ENCOUNTER — Ambulatory Visit: Payer: Medicare Other

## 2020-12-28 DIAGNOSIS — M6281 Muscle weakness (generalized): Secondary | ICD-10-CM | POA: Diagnosis not present

## 2020-12-28 DIAGNOSIS — R2689 Other abnormalities of gait and mobility: Secondary | ICD-10-CM

## 2020-12-28 NOTE — Therapy (Signed)
[facility] Outpatient Rehabilitation [facility] [address] [place], [place], [address] Phone: [phone]   Fax:  [phone]  Physical Therapy Treatment  Patient Details  Name: Amy Andrade MRN: [id number] Date of Birth: 1936-09-14 Referring Provider (PT): Dannielle Huh, MD   Encounter Date: 12/28/2020   PT End of Session - 12/28/20 1220    Visit Number 6    Number of Visits 12    Date for PT Re-Evaluation 01/19/21    Authorization Type UHC MCR, progress note visit 10    Progress Note Due on Visit 10    PT Start Time 1218    PT Stop Time 1258    PT Time Calculation (min) 40 min    Activity Tolerance Patient tolerated treatment well;No increased pain    Behavior During Therapy WFL for tasks assessed/performed           Past Medical History:  Diagnosis Date  . Alzheimer disease (HCC)   . Asthma   . Hyperlipidemia   . Hypertension   . Pulmonary embolism (HCC) 2004   was short of breath--was on coumadin for 6 mos to a year    Past Surgical History:  Procedure Laterality Date  . ABDOMINAL HYSTERECTOMY    . BUNIONECTOMY    . REPLACEMENT TOTAL KNEE  1980s, 2003   x2    There were no vitals filed for this visit.   Subjective Assessment - 12/28/20 1220    Subjective Pt presents to PT with reports of L shoulder pain, but no L LE pain in the last few days. She notes no discomfort in LEs after last session. Pt is ready to begin PT treatmnet at this time.    Currently in Pain? Yes    Pain Score 2     Pain Location Shoulder    Pain Orientation Left                             [facility] Adult PT Treatment/Exercise - 12/28/20 0001      Ambulation/Gait   Ambulation/Gait Yes    Ambulation/Gait Assistance 6: Modified independent (Device/Increase time)    Ambulation Distance (Feet) 185 Feet    Assistive device Rolling walker    Gait Pattern Decreased step length - left;Antalgic;Decreased trunk rotation    Gait Comments Verbal and tactile  cues for increasing trunk extension and staying within FWW      Knee/Hip Exercises: Aerobic   Nustep lvl 5 LE only x 5 min while taking subjective      Knee/Hip Exercises: Seated   Long Arc Quad 3 sets;15 reps;Weights    Long Arc Quad Weight 3 lbs.    Marching 2 sets;20 reps;Weights    Marching Limitations 3    Sit to Sand 2 sets;10 reps;without UE support   sitting on 4in step     Knee/Hip Exercises: Supine   Hip Adduction Isometric 2 sets;15 reps    Bridges 10 reps    Straight Leg Raises 2 sets;10 reps;Both    Other Supine Knee/Hip Exercises clamshell 3x15 black tband    Other Supine Knee/Hip Exercises supine single leg clam x 10 black tband                    PT Short Term Goals - 12/21/20 1926      PT SHORT TERM GOAL #1   Title Independent with initial HEP    Status On-going  PT SHORT TERM GOAL #2   Title Improve 5 times sit<>stand to 15 seconds or less for improved ability for transfers    Status On-going             PT Long Term Goals - 12/08/20 1629      PT LONG TERM GOAL #1   Title Improve Tinetti Gait + Balance score at least 3-5 points to work towards decreased fall risk    Baseline 13/28    Time 6    Period Weeks    Status New    Target Date 01/19/21      PT LONG TERM GOAL #2   Title Increase left knee extension/quadricep strength to 5/5 to improve ability for transfers from low seats and improve stability for ambulation    Baseline 4+/5    Time 6    Period Weeks    Status New    Target Date 01/19/21      PT LONG TERM GOAL #3   Title Improve TUG time at least 10 seconds or greater from baseline stauts for improved gait speed to work towards decreased fall risk    Baseline 49 seconds    Time 6    Period Weeks    Status New    Target Date 01/19/21      PT LONG TERM GOAL #4   Title Pt./family to be independent with advanced HEP for continued progression after d/c from formal therapy    Baseline will add/update as appropriate  pending progress    Time 6    Period Weeks    Status New    Target Date 01/19/21                 Plan - 12/28/20 1245    Clinical Impression Statement Pt was once again able to complete all prescribed therapeutic exercises with no adverse effect. Wedge was placed to elevate trunk during supine activity per pt request, however, this did have slight increase in pain during supine bridge activity. Overall she does seem to be improving her functional activity tolerance and LE strength. She would benefit from progession to standing therapeutic exercises at next session. PT will otherwise continue with POC as prescribed.    PT Treatment/Interventions ADLs/Self Care Home Management;Cryotherapy;Moist Heat;Gait training;Stair training;Therapeutic exercise;Patient/family education;Balance training;Functional mobility training;Neuromuscular re-education;Therapeutic activities;Manual techniques    PT Next Visit Plan continue to progress exercises as able with empahasis on standing progressions as able    PT Home Exercise Plan Access code: [email or web address]           Patient will benefit from skilled therapeutic intervention in order to improve the following deficits and impairments:  Abnormal gait,Decreased balance,Difficulty walking,Decreased safety awareness,Decreased activity tolerance,Decreased strength,Decreased endurance  Visit Diagnosis: Muscle weakness (generalized)  Other abnormalities of gait and mobility     Problem List Patient Active Problem List   Diagnosis Date Noted  . Leukopenia 09/08/2020  . Unspecified protein-calorie malnutrition (HCC) 09/08/2020  . Neurocognitive deficits 09/08/2020  . Femur fracture, left (HCC) 09/01/2020  . Closed fracture of left distal femur (HCC) 08/31/2020  . Coronavirus infection 08/31/2020  . Chronic left shoulder pain 04/25/2020  . Chronic bilateral low back pain without sciatica 04/25/2020  . Uncomplicated alcohol dependence (HCC) 04/25/2020   . Hyponatremia 05/31/2016  . Incidental lung nodule 05/30/2016  . History of pulmonary embolism 05/30/2016  . Alzheimer disease (HCC)   . Asthma   . Hyperlipidemia   . Hypertension   . Skin lesion of chest  wall 06/22/2011    Eloy End, PT, DPT 12/28/20 1:25 PM  [facility] [address] [place], [place], [address] Phone: [phone]   Fax:  [phone]  Name: Amy Andrade MRN: [id number] Date of Birth: 06-06-1936

## 2021-01-02 ENCOUNTER — Other Ambulatory Visit: Payer: Self-pay

## 2021-01-02 ENCOUNTER — Ambulatory Visit: Payer: Medicare Other

## 2021-01-02 DIAGNOSIS — M6281 Muscle weakness (generalized): Secondary | ICD-10-CM

## 2021-01-02 DIAGNOSIS — R2689 Other abnormalities of gait and mobility: Secondary | ICD-10-CM | POA: Diagnosis not present

## 2021-01-02 NOTE — Therapy (Signed)
[facility]patient Rehabilitation [facility] [address] [place], [place], [address] Phone: [phone]   Fax:  [phone]  Physical Therapy Treatment  Patient Details  Name: Amy Andrade MRN: [id number] Date of Birth: 03/03/1936 Referring Provider (PT): Dannielle Huh, MD   Encounter Date: 01/02/2021   PT End of Session - 01/02/21 1443    Visit Number 7    Number of Visits 12    Date for PT Re-Evaluation 01/19/21    Authorization Type UHC MCR, progress note visit 10    Progress Note Due on Visit 10    PT Start Time 1444    PT Stop Time 1527    PT Time Calculation (min) 43 min    Activity Tolerance Patient tolerated treatment well    Behavior During Therapy [facility] for tasks assessed/performed           Past Medical History:  Diagnosis Date  . Alzheimer disease (HCC)   . Asthma   . Hyperlipidemia   . Hypertension   . Pulmonary embolism (HCC) 2004   was short of breath--was on coumadin for 6 mos to a year    Past Surgical History:  Procedure Laterality Date  . ABDOMINAL HYSTERECTOMY    . BUNIONECTOMY    . REPLACEMENT TOTAL KNEE  1980s, 2003   x2    There were no vitals filed for this visit.   Subjective Assessment - 01/02/21 1443    Subjective Pt presents to PT with no current reports of L knee or shoulder pain. She has been compliant with her HEP with no adverse effect. Pt is ready to begin PT treatment at this time.    Pertinent History Alzheimer's/dementia, bilat. TKA, PE, HTN, HLD, asthma    Currently in Pain? No/denies    Pain Score 0-No pain                             [facility] Adult PT Treatment/Exercise - 01/02/21 0001      Knee/Hip Exercises: Aerobic   Nustep lvl 5 LE only x 5 min while taking subjective      Knee/Hip Exercises: Standing   Knee Flexion 2 sets;10 reps;Both    Hip Abduction 2 sets;10 reps;Both    Hip Extension 2 sets;10 reps;Both      Knee/Hip Exercises: Seated   Long Arc Quad 3 sets;15  reps;Weights    Long Arc Quad Weight 3 lbs.    Marching 2 sets;20 reps    Marching Weights 3 lbs.    Sit to Sand 2 sets;10 reps;without UE support   sitting on 4in step     Knee/Hip Exercises: Supine   Hip Adduction Isometric 2 sets;15 reps    Hip Adduction Isometric Limitations 5 sec hold    Bridges Limitations attempted, increased pain    Straight Leg Raises 2 sets;10 reps;Both    Other Supine Knee/Hip Exercises clamshell 3x15 black tband    Other Supine Knee/Hip Exercises modified leg press into exercise ball 2x10                    PT Short Term Goals - 12/21/20 1926      PT SHORT TERM GOAL #1   Title Independent with initial HEP    Status On-going      PT SHORT TERM GOAL #2   Title Improve 5 times sit<>stand to 15 seconds or less for improved ability for transfers    Status  On-going             PT Long Term Goals - 12/08/20 1629      PT LONG TERM GOAL #1   Title Improve Tinetti Gait + Balance score at least 3-5 points to work towards decreased fall risk    Baseline 13/28    Time 6    Period Weeks    Status New    Target Date 01/19/21      PT LONG TERM GOAL #2   Title Increase left knee extension/quadricep strength to 5/5 to improve ability for transfers from low seats and improve stability for ambulation    Baseline 4+/5    Time 6    Period Weeks    Status New    Target Date 01/19/21      PT LONG TERM GOAL #3   Title Improve TUG time at least 10 seconds or greater from baseline stauts for improved gait speed to work towards decreased fall risk    Baseline 49 seconds    Time 6    Period Weeks    Status New    Target Date 01/19/21      PT LONG TERM GOAL #4   Title Pt./family to be independent with advanced HEP for continued progression after d/c from formal therapy    Baseline will add/update as appropriate pending progress    Time 6    Period Weeks    Status New    Target Date 01/19/21                 Plan - 01/02/21 1514     Clinical Impression Statement Pt was once again able to complete all prescribed exercises with no adverse effect, with exception of increased UE discomfort with standing exercises. Pt demonstrated improved functional activity tolerance with progression to standing proximal hip strengthening exercises. She continues to benefit from skilled PT exercises and should continue to be seen and progressed per POC as prescribed.    PT Treatment/Interventions ADLs/Self Care Home Management;Cryotherapy;Moist Heat;Gait training;Stair training;Therapeutic exercise;Patient/family education;Balance training;Functional mobility training;Neuromuscular re-education;Therapeutic activities;Manual techniques    PT Next Visit Plan continue to progress exercises as able with empahasis on standing continued progressions as able    PT Home Exercise Plan Access code: [email or web address]           Patient will benefit from skilled therapeutic intervention in order to improve the following deficits and impairments:  Abnormal gait,Decreased balance,Difficulty walking,Decreased safety awareness,Decreased activity tolerance,Decreased strength,Decreased endurance  Visit Diagnosis: Muscle weakness (generalized)  Other abnormalities of gait and mobility     Problem List Patient Active Problem List   Diagnosis Date Noted  . Leukopenia 09/08/2020  . Unspecified protein-calorie malnutrition (HCC) 09/08/2020  . Neurocognitive deficits 09/08/2020  . Femur fracture, left (HCC) 09/01/2020  . Closed fracture of left distal femur (HCC) 08/31/2020  . Coronavirus infection 08/31/2020  . Chronic left shoulder pain 04/25/2020  . Chronic bilateral low back pain without sciatica 04/25/2020  . Uncomplicated alcohol dependence (HCC) 04/25/2020  . Hyponatremia 05/31/2016  . Incidental lung nodule 05/30/2016  . History of pulmonary embolism 05/30/2016  . Alzheimer disease (HCC)   . Asthma   . Hyperlipidemia   . Hypertension   . Skin lesion of  chest wall 06/22/2011    Eloy End, PT, DPT 01/02/21 3:29 PM  [facility] [address] [place], [place], [address] Phone: [phone]   Fax:  [phone]  Name: RHEGAN TRUNNELL MRN: [id number]  Date of Birth: 11-Aug-1936

## 2021-01-04 ENCOUNTER — Telehealth: Payer: Self-pay

## 2021-01-04 ENCOUNTER — Ambulatory Visit: Payer: Medicare Other

## 2021-01-04 NOTE — Telephone Encounter (Signed)
PT called and LVM to patient's son regarding missed visit and reminder of next appointment.

## 2021-01-09 ENCOUNTER — Other Ambulatory Visit: Payer: Self-pay

## 2021-01-09 ENCOUNTER — Ambulatory Visit: Payer: Medicare Other

## 2021-01-09 DIAGNOSIS — M6281 Muscle weakness (generalized): Secondary | ICD-10-CM | POA: Diagnosis not present

## 2021-01-09 DIAGNOSIS — R2689 Other abnormalities of gait and mobility: Secondary | ICD-10-CM

## 2021-01-09 NOTE — Therapy (Signed)
[place], [place], [address] Phone: [phone]   Fax:  [phone]  Physical Therapy Treatment  Patient Details  Name: Amy Andrade MRN: [id number] Date of Birth: 1936-05-16 Referring Provider (PT): Vickey Huger, MD   Encounter Date: 01/09/2021   PT End of Session - 01/09/21 1444    Visit Number 8    Number of Visits 12    Date for PT Re-Evaluation 01/19/21    Authorization Type UHC MCR, progress note visit 10    Progress Note Due on Visit 10    PT Start Time 9357    PT Stop Time 1528    PT Time Calculation (min) 43 min    Activity Tolerance Patient tolerated treatment well    Behavior During Therapy [facility] for tasks assessed/performed           Past Medical History:  Diagnosis Date  . Alzheimer disease ([place])   . Asthma   . Hyperlipidemia   . Hypertension   . Pulmonary embolism ([place]) 2004   was short of breath--was on coumadin for 6 mos to a year    Past Surgical History:  Procedure Laterality Date  . ABDOMINAL HYSTERECTOMY    . BUNIONECTOMY    . REPLACEMENT TOTAL KNEE  1980s, 2003   x2    There were no vitals filed for this visit.   Subjective Assessment - 01/09/21 1444    Subjective Pt presents to PT with reports of no current L knee pain or discomfort. She has been compliant with her HEP with no adverse effect when she remembers to do them. Pt is ready to begin PT treatment at this time.    Currently in Pain? No/denies    Pain Score 0-No pain                             [facility] Adult PT Treatment/Exercise - 01/09/21 0001      Knee/Hip Exercises: Aerobic   Nustep lvl 5 LE only x 5 min while taking subjective      Knee/Hip Exercises: Standing   Knee Flexion 10 reps;Both    Hip Abduction 2 sets;10 reps;Both      Knee/Hip Exercises: Seated   Long Arc Quad 3 sets;15 reps;Both    Long Arc Quad Weight 3 lbs.    Marching 2 sets;20 reps    Marching Weights 3 lbs.     Sit to Sand 10 reps   4in step with 1 UE assist     Knee/Hip Exercises: Supine   Straight Leg Raises 2 sets;10 reps;Both    Other Supine Knee/Hip Exercises clamshell 2x15 black tband                    PT Short Term Goals - 12/21/20 1926      PT SHORT TERM GOAL #1   Title Independent with initial HEP    Status On-going      PT SHORT TERM GOAL #2   Title Improve 5 times sit<>stand to 15 seconds or less for improved ability for transfers    Status On-going             PT Long Term Goals - 12/08/20 1629      PT LONG TERM GOAL #1   Title Improve Tinetti Gait + Balance score at least 3-5 points to work towards decreased fall risk    Baseline 13/28  Time 6    Period Weeks    Status New    Target Date 01/19/21      PT LONG TERM GOAL #2   Title Increase left knee extension/quadricep strength to 5/5 to improve ability for transfers from low seats and improve stability for ambulation    Baseline 4+/5    Time 6    Period Weeks    Status New    Target Date 01/19/21      PT LONG TERM GOAL #3   Title Improve TUG time at least 10 seconds or greater from baseline stauts for improved gait speed to work towards decreased fall risk    Baseline 49 seconds    Time 6    Period Weeks    Status New    Target Date 01/19/21      PT LONG TERM GOAL #4   Title Pt./family to be independent with advanced HEP for continued progression after d/c from formal therapy    Baseline will add/update as appropriate pending progress    Time 6    Period Weeks    Status New    Target Date 01/19/21                 Plan - 01/09/21 1533    Clinical Impression Statement Pt tolerated treatment well today and was able to complete all prescribed exercises with no adverse effect. She was not able to perform as many today d/t slight fatigue, but overall is continuing to progress very well with therapy. PT will assess goals at next session and after discussion with pt and pt's son, will  potentially discharge if most goals have been met. Will otherwise continue with POC as prescribed.    PT Treatment/Interventions ADLs/Self Care Home Management;Cryotherapy;Moist Heat;Gait training;Stair training;Therapeutic exercise;Patient/family education;Balance training;Functional mobility training;Neuromuscular re-education;Therapeutic activities;Manual techniques    PT Next Visit Plan assess goals, review HEP if appropriate for discharge    PT Home Exercise Plan Access code: [email or web address]    Consulted and Agree with Plan of Care Patient;Family member/caregiver    Family Member Consulted son           Patient will benefit from skilled therapeutic intervention in order to improve the following deficits and impairments:  Abnormal gait,Decreased balance,Difficulty walking,Decreased safety awareness,Decreased activity tolerance,Decreased strength,Decreased endurance  Visit Diagnosis: Muscle weakness (generalized)  Other abnormalities of gait and mobility     Problem List Patient Active Problem List   Diagnosis Date Noted  . Leukopenia 09/08/2020  . Unspecified protein-calorie malnutrition ([place]) 09/08/2020  . Neurocognitive deficits 09/08/2020  . Femur fracture, left ([place]) 09/01/2020  . Closed fracture of left distal femur ([place]) 08/31/2020  . Coronavirus infection 08/31/2020  . Chronic left shoulder pain 04/25/2020  . Chronic bilateral low back pain without sciatica 04/25/2020  . Uncomplicated alcohol dependence ([place]) 04/25/2020  . Hyponatremia 05/31/2016  . Incidental lung nodule 05/30/2016  . History of pulmonary embolism 05/30/2016  . Alzheimer disease ([place])   . Asthma   . Hyperlipidemia   . Hypertension   . Skin lesion of chest wall 06/22/2011    Ward Chatters, PT, DPT 01/09/21 3:37 PM  [facility] Outpatient Rehabilitation [facility] [address] [place], [place], [address] Phone: [phone]   Fax:  [phone]  Name: Amy Andrade MRN:  [id number] Date of Birth: May 26, 1936

## 2021-01-11 ENCOUNTER — Other Ambulatory Visit: Payer: Self-pay

## 2021-01-11 ENCOUNTER — Ambulatory Visit: Payer: Medicare Other

## 2021-01-11 DIAGNOSIS — R2689 Other abnormalities of gait and mobility: Secondary | ICD-10-CM | POA: Diagnosis not present

## 2021-01-11 DIAGNOSIS — M6281 Muscle weakness (generalized): Secondary | ICD-10-CM | POA: Diagnosis not present

## 2021-01-11 NOTE — Therapy (Signed)
[place], [place], [address] Phone: [phone]   Fax:  [phone]  Physical Therapy Treatment/Discharge  Patient Details  Name: Amy Andrade MRN: [id number] Date of Birth: [birth year] Referring Provider (PT): Vickey Huger, MD   Encounter Date: 01/11/2021   PT End of Session - 01/11/21 1439    Visit Number 9    Number of Visits 12    Date for PT Re-Evaluation 01/19/21    Authorization Type UHC MCR, progress note visit 10    Progress Note Due on Visit 10    PT Start Time 1442    PT Stop Time 1521    PT Time Calculation (min) 39 min    Activity Tolerance Patient tolerated treatment well    Behavior During Therapy [facility] for tasks assessed/performed           Past Medical History:  Diagnosis Date  . Alzheimer disease ([place])   . Asthma   . Hyperlipidemia   . Hypertension   . Pulmonary embolism ([place]) 2004   was short of breath--was on coumadin for 6 mos to a year    Past Surgical History:  Procedure Laterality Date  . ABDOMINAL HYSTERECTOMY    . BUNIONECTOMY    . REPLACEMENT TOTAL KNEE  1980s, 2003   x2    There were no vitals filed for this visit.       [facility] PT Assessment - 01/11/21 0001      Strength   Left Knee Extension 5/5      Transfers   Five time sit to stand comments  17 seconds with UE support      Timed Up and Go Test   Normal TUG (seconds) 45   w/ FWW and supervision assistance               [facility] Adult PT Treatment/Exercise - 01/11/21 0001      Knee/Hip Exercises: Standing   Heel Raises 10 reps;Both    Hip Abduction 10 reps;Both      Knee/Hip Exercises: Seated   Long Arc Quad 3 sets;10 reps    Long Arc Quad Limitations red tband    Ball Squeeze 3x10 - 5 sec hold    Other Seated Knee/Hip Exercises seated clamshell 2x15 black tband    Marching 2 sets;20 reps;Limitations    Marching Limitations black tband                  PT Education -  01/11/21 1524    Education Details HEP and discharge plan discussion with pt and son    Person(s) Educated Patient;Child(ren)    Methods Explanation;Demonstration;Handout    Comprehension Verbalized understanding;Returned demonstration            PT Short Term Goals - 01/11/21 1525      PT SHORT TERM GOAL #1   Title Independent with initial HEP    Status Achieved      PT SHORT TERM GOAL #2   Title Improve 5 times sit<>stand to 15 seconds or less for improved ability for transfers    Baseline 17 seconds    Status Partially Met             PT Long Term Goals - 01/11/21 1445      PT LONG TERM GOAL #1   Title Improve Tinetti Gait + Balance score at least 3-5 points to work towards decreased fall risk    Baseline 01/11/21 - DNT  Time 6    Period Weeks    Status Not Met      PT LONG TERM GOAL #2   Title Increase left knee extension/quadricep strength to 5/5 to improve ability for transfers from low seats and improve stability for ambulation    Baseline see flowsheet - 5/5    Time 6    Period Weeks    Status Achieved      PT LONG TERM GOAL #3   Title Improve TUG time at least 10 seconds or greater from baseline stauts for improved gait speed to work towards decreased fall risk    Baseline 46 seconds - 01/11/21    Time 6    Period Weeks    Status Not Met      PT LONG TERM GOAL #4   Title Pt./family to be independent with advanced HEP for continued progression after d/c from formal therapy    Baseline will add/update as appropriate pending progress    Time 6    Period Weeks    Status Achieved                 Plan - 01/11/21 1530    Clinical Impression Statement Pt tolerated treatment well and was able to demonstrate knowledge of HEP with no adverse effect. She was able to meet her LE strength goals, but had similar if not slightly improved scores for balance and functional mobility measures during TUG and 5xSTS. At this point pt is modified indpendent with  transfers and ambulation with pt's son stating she is moving around the house well with no compliants by family at this time. Pt is appropriate for discharge at this time as she is ambulating safely post periprostetic fracture and has otherwise plateau'd with PT services, as her bilateral shoulder pain and past history of dislocation reduces ability to progress standing activity as she requires UE support. Pt should continue to improve with HEP compliance and is being discharged with this in place at this time.    Personal Factors and Comorbidities Comorbidity 3+    Comorbidities Alzheimer's, TKA, asthma, HTN-see PMH for further detials    PT Treatment/Interventions ADLs/Self Care Home Management;Cryotherapy;Moist Heat;Gait training;Stair training;Therapeutic exercise;Patient/family education;Balance training;Functional mobility training;Neuromuscular re-education;Therapeutic activities;Manual techniques    PT Home Exercise Plan Access code: [email or web address]    Consulted and Agree with Plan of Care Patient;Family member/caregiver    Family Member Consulted son           Patient will benefit from skilled therapeutic intervention in order to improve the following deficits and impairments:  Abnormal gait,Decreased balance,Difficulty walking,Decreased safety awareness,Decreased activity tolerance,Decreased strength,Decreased endurance  Visit Diagnosis: Muscle weakness (generalized)  Other abnormalities of gait and mobility     Problem List Patient Active Problem List   Diagnosis Date Noted  . Leukopenia 09/08/2020  . Unspecified protein-calorie malnutrition ([place]) 09/08/2020  . Neurocognitive deficits 09/08/2020  . Femur fracture, left ([place]) 09/01/2020  . Closed fracture of left distal femur ([place]) 08/31/2020  . Coronavirus infection 08/31/2020  . Chronic left shoulder pain 04/25/2020  . Chronic bilateral low back pain without sciatica 04/25/2020  . Uncomplicated alcohol dependence ([place])  04/25/2020  . Hyponatremia 05/31/2016  . Incidental lung nodule 05/30/2016  . History of pulmonary embolism 05/30/2016  . Alzheimer disease ([place])   . Asthma   . Hyperlipidemia   . Hypertension   . Skin lesion of chest wall 06/22/2011    Ward Chatters, PT, DPT 01/11/21 3:40 PM  [facility] [address] [place], [place], [address] Phone: [phone]   Fax:  [phone]  Name: Amy Andrade MRN: [id number] Date of Birth: 03-Mar-1936   PHYSICAL THERAPY DISCHARGE SUMMARY  Visits from Start of Care: 9  Current functional level related to goals / functional outcomes: Mod I with FWW for transfers/gait; can be supervision depending on fatigue   Remaining deficits: Patient continues to be at fall risk relative to TUG and 5xSTS; she has decreased functional mobility and gait speed, but has had no difficulty with home navigation per caregiver (son)   Education / Equipment: FWW; HEP  Plan: Patient agrees to discharge.  Patient goals were partially met. Patient is being discharged due to being pleased with the current functional level.  ?????

## 2021-01-23 DIAGNOSIS — R296 Repeated falls: Secondary | ICD-10-CM | POA: Diagnosis not present

## 2021-01-23 DIAGNOSIS — R2689 Other abnormalities of gait and mobility: Secondary | ICD-10-CM | POA: Diagnosis not present

## 2021-01-23 DIAGNOSIS — R2681 Unsteadiness on feet: Secondary | ICD-10-CM | POA: Diagnosis not present

## 2021-01-23 DIAGNOSIS — R279 Unspecified lack of coordination: Secondary | ICD-10-CM | POA: Diagnosis not present

## 2021-01-30 ENCOUNTER — Other Ambulatory Visit: Payer: Self-pay | Admitting: Adult Health

## 2021-01-30 DIAGNOSIS — E785 Hyperlipidemia, unspecified: Secondary | ICD-10-CM

## 2021-01-30 DIAGNOSIS — I10 Essential (primary) hypertension: Secondary | ICD-10-CM

## 2021-02-23 DIAGNOSIS — R2681 Unsteadiness on feet: Secondary | ICD-10-CM | POA: Diagnosis not present

## 2021-02-23 DIAGNOSIS — R2689 Other abnormalities of gait and mobility: Secondary | ICD-10-CM | POA: Diagnosis not present

## 2021-02-23 DIAGNOSIS — R279 Unspecified lack of coordination: Secondary | ICD-10-CM | POA: Diagnosis not present

## 2021-02-23 DIAGNOSIS — R296 Repeated falls: Secondary | ICD-10-CM | POA: Diagnosis not present

## 2021-03-01 ENCOUNTER — Ambulatory Visit: Payer: Medicare Other | Admitting: Nurse Practitioner

## 2021-03-01 ENCOUNTER — Encounter: Payer: Self-pay | Admitting: Family

## 2021-03-01 ENCOUNTER — Other Ambulatory Visit: Payer: Self-pay

## 2021-03-01 ENCOUNTER — Ambulatory Visit (INDEPENDENT_AMBULATORY_CARE_PROVIDER_SITE_OTHER): Payer: Medicare Other | Admitting: Family

## 2021-03-01 VITALS — BP 120/60 | HR 70 | Temp 97.5°F | Resp 18 | Ht 70.0 in | Wt 205.0 lb

## 2021-03-01 DIAGNOSIS — E785 Hyperlipidemia, unspecified: Secondary | ICD-10-CM

## 2021-03-01 DIAGNOSIS — S72402S Unspecified fracture of lower end of left femur, sequela: Secondary | ICD-10-CM | POA: Diagnosis not present

## 2021-03-01 DIAGNOSIS — J4520 Mild intermittent asthma, uncomplicated: Secondary | ICD-10-CM

## 2021-03-01 DIAGNOSIS — B372 Candidiasis of skin and nail: Secondary | ICD-10-CM | POA: Diagnosis not present

## 2021-03-01 DIAGNOSIS — L602 Onychogryphosis: Secondary | ICD-10-CM

## 2021-03-01 DIAGNOSIS — I10 Essential (primary) hypertension: Secondary | ICD-10-CM

## 2021-03-01 DIAGNOSIS — M25512 Pain in left shoulder: Secondary | ICD-10-CM

## 2021-03-01 DIAGNOSIS — H6123 Impacted cerumen, bilateral: Secondary | ICD-10-CM

## 2021-03-01 DIAGNOSIS — G8929 Other chronic pain: Secondary | ICD-10-CM | POA: Diagnosis not present

## 2021-03-01 MED ORDER — METHOCARBAMOL 500 MG PO TABS: 500.0000 mg | ORAL_TABLET | Freq: Every day | ORAL | 3 refills | Status: DC

## 2021-03-01 MED ORDER — DEBROX 6.5 % OT SOLN
5.0000 [drp] | Freq: Two times a day (BID) | OTIC | 0 refills | Status: AC
Start: 2021-03-01 — End: 2021-03-05

## 2021-03-01 MED ORDER — HYDROCODONE-ACETAMINOPHEN 5-325 MG PO TABS: 1.0000 | ORAL_TABLET | Freq: Two times a day (BID) | ORAL | 0 refills | Status: DC | PRN

## 2021-03-01 MED ORDER — NYSTATIN 100000 UNIT/GM EX CREA: 1.0000 "application " | TOPICAL_CREAM | Freq: Two times a day (BID) | CUTANEOUS | 0 refills | Status: DC

## 2021-03-01 NOTE — Patient Instructions (Signed)
-   instil debrox solution 5 drops into each ear twice daily for 4 days then follow up for ear lavage.  - apply Nystatin to red areas on left breast fold twice daily until resolve.Notify provider if symptoms worsen.

## 2021-03-01 NOTE — Progress Notes (Signed)
Provider: [name]   Wadsworth Skolnick, Nelda Bucks, NP  Patient Care Team: Ashyah Quizon, Nelda Bucks, NP as PCP - General (Family Medicine) Marylynn Pearson, MD as Consulting Physician (Ophthalmology)  Extended Emergency Contact Information Primary Emergency Contact: Grubb,[name] Address: [address], [place] [address] [name] of [place] Phone: [phone] Relation: Other Secondary Emergency Contact: Wisner,[name] Address: [place], [place] [address] [name] of [place] Phone: [phone] Relation: Son  Code Status:Full code  Goals of care: Advanced Directive information Advanced Directives 03/01/2021  Does Patient Have a Medical Advance Directive? Yes  Type of Advance Directive [place]  Does patient want to make changes to medical advance directive? No - Patient declined  Copy of [place] in Chart? Yes - validated most recent copy scanned in chart (See row information)  Would patient like information on creating a medical advance directive? -     Chief Complaint  Patient presents with   Medical Management of Chronic Issues    4 month follow up.    Immunizations    Discuss the need for Tetanus vaccine, Shingrix vaccine, and Covid vaccine.    HPI:  Pt is a 85 y.o. female seen today for  4 month follow up for medical management of chronic diseases. Has a medical history of Hypertension,Hyperlipidemia,Mild intermittent Asthma,alzheimer's disease  She denies any acute issues .Has chronic left shoulder pain continues to require hydrocodone twice daily. Also request refill on robaxin.Narcotic uses contract refilled and signed today. She is due for Tdap vaccine.discussed need for Tdap vaccine will send script pharmacy. Also advised to get her Shingrix vaccine  and COVID-19 vaccine.  Past Medical History:  Diagnosis Date   Alzheimer disease ([facility])    Asthma     Hyperlipidemia    Hypertension    Pulmonary embolism ([facility]) 2004   was short of breath--was on coumadin for 6 mos to a year   Past Surgical History:  Procedure Laterality Date   ABDOMINAL HYSTERECTOMY     BUNIONECTOMY     REPLACEMENT TOTAL KNEE  1980s, 2003   x2    No Known Allergies  Allergies as of 03/01/2021   No Known Allergies      Medication List        Accurate as of March 01, 2021  1:19 PM. If you have any questions, ask your nurse or doctor.          STOP taking these medications    acetaminophen 500 MG tablet Commonly known as: TYLENOL Stopped by: Sandrea Hughs, NP       TAKE these medications    amLODipine 10 MG tablet Commonly known as: NORVASC TAKE 1 TABLET(10 MG) BY MOUTH DAILY   aspirin 81 MG tablet Take 81 mg by mouth daily.   HYDROcodone-acetaminophen 5-325 MG tablet Commonly known as: NORCO/VICODIN Take 1 tablet by mouth every 12 (twelve) hours as needed for severe pain.   irbesartan 300 MG tablet Commonly known as: AVAPRO TAKE 1 TABLET(300 MG) BY MOUTH DAILY   methocarbamol 500 MG tablet Commonly known as: ROBAXIN Take 500 mg by mouth daily.   metoprolol tartrate 25 MG tablet Commonly known as: LOPRESSOR Take 1 tablet (25 mg total) by mouth 2 (two) times daily.   nitroGLYCERIN 0.4 MG SL tablet Commonly known as:  NITROSTAT Place 1 tablet (0.4 mg total) under the tongue every 5 (five) minutes x 3 doses as needed for chest pain.   pravastatin 40 MG tablet Commonly known as: PRAVACHOL TAKE 1 TABLET(40 MG) BY MOUTH DAILY        Review of Systems  Constitutional:  Negative for appetite change, chills, fatigue, fever and unexpected weight change.  HENT:  Negative for congestion, dental problem, ear discharge, ear pain, facial swelling, hearing loss, nosebleeds, postnasal drip, rhinorrhea, sinus pressure, sinus pain, sneezing, sore throat, tinnitus and trouble swallowing.   Eyes:  Negative for pain, discharge, redness, itching  and visual disturbance.  Respiratory:  Negative for cough, chest tightness, shortness of breath and wheezing.   Cardiovascular:  Negative for chest pain, palpitations and leg swelling.  Gastrointestinal:  Negative for abdominal distention, abdominal pain, blood in stool, constipation, diarrhea, nausea and vomiting.  Endocrine: Negative for cold intolerance, heat intolerance, polydipsia, polyphagia and polyuria.  Genitourinary:  Negative for difficulty urinating, dysuria, flank pain, frequency and urgency.  Musculoskeletal:  Positive for arthralgias and gait problem. Negative for joint swelling, myalgias, neck pain and neck stiffness.  Skin:  Negative for color change, pallor, rash and wound.  Neurological:  Negative for dizziness, syncope, speech difficulty, weakness, light-headedness, numbness and headaches.  Hematological:  Does not bruise/bleed easily.  Psychiatric/Behavioral:  Negative for agitation, behavioral problems, confusion, hallucinations, self-injury, sleep disturbance and suicidal ideas. The patient is not nervous/anxious.    Immunization History  Administered Date(s) Administered   Influenza, High Dose Seasonal PF 10/27/2018, 07/18/2020   Influenza,inj,Quad PF,6+ Mos 05/27/2015, 06/18/2016   Influenza-Unspecified 05/18/2017   Pneumococcal Conjugate-13 10/11/2014   Pneumococcal Polysaccharide-23 01/20/2016   Tdap 09/17/2010   Zoster, Live 07/22/2014   Pertinent  Health Maintenance Due  Topic Date Due   INFLUENZA VACCINE  04/17/2021   DEXA SCAN  Completed   PNA vac Low Risk Adult  Completed   Fall Risk  03/01/2021 11/24/2020 11/03/2020 04/25/2020 12/09/2019  Falls in the past year? 0 1 1 0 0  Number falls in past yr: 0 0 0 0 0  Comment - - knee - -  Injury with Fall? 0 1 0 0 0   Functional Status Survey:    Vitals:   03/01/21 1303  BP: 120/60  Pulse: 70  Resp: 18  Temp: (!) 97.5 F (36.4 C)  SpO2: 96%  Weight: 205 lb (93 kg)  Height: $Remove'5\' 10"'CZFscyn$  (1.778 m)   Body mass  index is 29.41 kg/m. Physical Exam Vitals reviewed.  Constitutional:      General: She is not in acute distress.    Appearance: Normal appearance. She is overweight. She is not ill-appearing or diaphoretic.  HENT:     Head: Normocephalic.     Right Ear: Tympanic membrane, ear canal and external ear normal. There is no impacted cerumen.     Left Ear: Tympanic membrane, ear canal and external ear normal. There is no impacted cerumen.     Nose: Nose normal. No congestion or rhinorrhea.     Mouth/Throat:     Mouth: Mucous membranes are moist.     Pharynx: Oropharynx is clear. No oropharyngeal exudate or posterior oropharyngeal erythema.  Eyes:     General: No scleral icterus.       Right eye: No discharge.        Left eye: No discharge.     Extraocular Movements: Extraocular movements intact.     Conjunctiva/sclera: Conjunctivae normal.     Pupils: Pupils  are equal, round, and reactive to light.  Neck:     Vascular: No carotid bruit.  Cardiovascular:     Rate and Rhythm: Normal rate and regular rhythm.     Pulses: Normal pulses.     Heart sounds: Normal heart sounds. No murmur heard.   No friction rub. No gallop.  Pulmonary:     Effort: Pulmonary effort is normal. No respiratory distress.     Breath sounds: Normal breath sounds. No wheezing, rhonchi or rales.  Chest:     Chest wall: No tenderness.  Abdominal:     General: Bowel sounds are normal. There is no distension.     Palpations: Abdomen is soft. There is no mass.     Tenderness: There is no abdominal tenderness. There is no right CVA tenderness, left CVA tenderness, guarding or rebound.  Musculoskeletal:        General: No swelling or tenderness.     Cervical back: Normal range of motion. No rigidity or tenderness.     Right lower leg: No edema.     Left lower leg: No edema.     Comments: unsteady  Feet:     Right foot:     Skin integrity: Skin integrity normal.     Toenail Condition: Right toenails are long.      Left foot:     Skin integrity: Skin integrity normal.     Toenail Condition: Left toenails are long.  Lymphadenopathy:     Cervical: No cervical adenopathy.  Skin:    General: Skin is warm and dry.     Coloration: Skin is not pale.     Findings: No bruising, erythema, lesion or rash.  Neurological:     Mental Status: She is alert. Mental status is at baseline.     Cranial Nerves: No cranial nerve deficit.     Sensory: No sensory deficit.     Motor: No weakness.     Coordination: Coordination normal.     Gait: Gait normal.  Psychiatric:        Mood and Affect: Mood normal.        Speech: Speech normal.        Behavior: Behavior normal.        Thought Content: Thought content normal.        Judgment: Judgment normal.    Labs reviewed: Recent Labs    09/03/20 0301 09/04/20 0443 09/12/20 0000 10/20/20 0000 11/03/20 1041  NA 130* 132* 134* 136* 136  K 4.1 3.7 4.8 4.8 5.0  CL 97* 96* 98* 99 101  CO2 $Re'23 24 22 21 25  'RAW$ GLUCOSE 107* 120*  --   --  86  BUN $Re'13 8 16 16 13  'UGN$ CREATININE 0.83 0.72 0.7 0.8 0.67  CALCIUM 8.7* 9.0 9.5 9.5 9.2  MG  --  1.7  --   --   --    Recent Labs    08/31/20 1412 09/03/20 0301 09/04/20 0443 09/12/20 0000  AST 44* $Remov'29 30 17  'IuRSyR$ ALT $Remo'23 18 20 16  'ujQrS$ ALKPHOS 64 44 54 92  BILITOT 1.0 0.8 1.0  --   PROT 6.9 5.7* 6.8  --   ALBUMIN 3.8 3.0* 3.4* 4.0   Recent Labs    09/02/20 0212 09/03/20 0301 09/04/20 0443 09/12/20 0000 10/20/20 0000  WBC 3.4* 2.6* 3.8* 5.5 5.6  NEUTROABS  --   --  2.3 3.50 3.90  HGB 12.9 12.2 13.8 13.6 13.1  HCT 35.4* 34.7* 40.6 40 39  MCV 92.4 94.8 95.3  --   --   PLT 179 171 211 404* 292   Lab Results  Component Value Date   TSH 1.460 09/02/2020   Lab Results  Component Value Date   HGBA1C 6.4 (H) 04/19/2015   Lab Results  Component Value Date   CHOL 192 05/03/2020   HDL 102 05/03/2020   LDLCALC 76 05/03/2020   TRIG 48 05/03/2020   CHOLHDL 1.9 05/03/2020    Significant Diagnostic Results in last 30 days:  No  results found.  Assessment/Plan 1. Essential hypertension B/p well controlled  - continue on Amlodipine,irbesartan and metoprolol On ASA and Statin for cardiac event prevention - CBC with Differential/Platelet; Future - CMP with eGFR([facility]); Future - TSH; Future - Lipid panel; Future  2. Mild intermittent asthma without complication Controlled. - Bilateral lung sounds clear no wheezing noted.  3. Chronic left shoulder pain Continue to require Norco.contract reviewed and signed today.  - Urine Drug Screen w/Alc, no confirm([facility]); Future - HYDROcodone-acetaminophen (NORCO/VICODIN) 5-325 MG tablet; Take 1 tablet by mouth every 12 (twelve) hours as needed for severe pain.  Dispense: 30 tablet; Refill: 0 - methocarbamol (ROBAXIN) 500 MG tablet; Take 1 tablet (500 mg total) by mouth daily.  Dispense: 30 tablet; Refill: 3  4. Overgrown toenails Long overgrown toenail will refer to podiatrist to trim toenails.  - Ambulatory referral to Podiatry  5. Hyperlipidemia, unspecified hyperlipidemia type LDL at goal  Continue on pravastatin  - Lipid panel; Future  6. Candidiasis of skin Left breast fold beefy skin redness. - Apply Nystatin as below. -Advised to keep skin dry  - nystatin cream (MYCOSTATIN); Apply 1 application topically 2 (two) times daily.  Dispense: 30 g; Refill: 0  7. Bilateral impacted cerumen Bilateral TM not visualized.Advised to apply Debrox as below then follow up forear lavage. - carbamide peroxide (DEBROX) 6.5 % OTIC solution; Place 5 drops into both ears 2 (two) times daily for 4 days.  Dispense: 15 mL; Refill: 0  8. Closed fracture of distal end of left femur, unspecified fracture morphology, sequela Continue on Hydrocodone contract reviewed and signed.PDMP reviewed.  - HYDROcodone-acetaminophen (NORCO/VICODIN) 5-325 MG tablet; Take 1 tablet by mouth every 12 (twelve) hours as needed for severe pain.  Dispense: 30 tablet; Refill: 0  Family/ staff  Communication: Reviewed plan of care with patient verbalized understanding  Labs/tests ordered:  - CBC with Differential/Platelet; Future - CMP with eGFR([facility]); Future - TSH; Future - Lipid panel; Future  Next Appointment : 6 months for medical management of chronic issues.Fasting Labs in 1 week and Bilateral ear lavage.     Sandrea Hughs, NP

## 2021-03-08 ENCOUNTER — Other Ambulatory Visit: Payer: Self-pay

## 2021-03-08 ENCOUNTER — Encounter: Payer: Self-pay | Admitting: Family

## 2021-03-08 ENCOUNTER — Other Ambulatory Visit: Payer: Medicare Other

## 2021-03-08 ENCOUNTER — Ambulatory Visit (INDEPENDENT_AMBULATORY_CARE_PROVIDER_SITE_OTHER): Payer: Medicare Other | Admitting: Family

## 2021-03-08 VITALS — BP 122/60 | HR 64 | Temp 97.1°F | Resp 16 | Ht 70.0 in | Wt 205.0 lb

## 2021-03-08 DIAGNOSIS — G8929 Other chronic pain: Secondary | ICD-10-CM | POA: Diagnosis not present

## 2021-03-08 DIAGNOSIS — E785 Hyperlipidemia, unspecified: Secondary | ICD-10-CM

## 2021-03-08 DIAGNOSIS — M25512 Pain in left shoulder: Secondary | ICD-10-CM | POA: Diagnosis not present

## 2021-03-08 DIAGNOSIS — R7309 Other abnormal glucose: Secondary | ICD-10-CM | POA: Diagnosis not present

## 2021-03-08 DIAGNOSIS — I10 Essential (primary) hypertension: Secondary | ICD-10-CM

## 2021-03-08 DIAGNOSIS — H6123 Impacted cerumen, bilateral: Secondary | ICD-10-CM | POA: Diagnosis not present

## 2021-03-08 NOTE — Progress Notes (Signed)
Provider: Richarda Blade FNP-C  Arleen Bar, Donalee Citrin, NP  Patient Care Team: Evander Macaraeg, Donalee Citrin, NP as PCP - General (Family Medicine) Chalmers Guest, MD as Consulting Physician (Ophthalmology)  Extended Emergency Contact Information Primary Emergency Contact: Heemstra,[name] Address: [address] [place], [place] [address] Darden [place] of [place] Home Phone: [phone] Relation: Other Secondary Emergency Contact: Gautney,[name] Address: [address]          [place], [place] [address] Darden Amber of [place] Home Phone: [phone] Relation: Son  Code Status:  Full code  Goals of care: Advanced Directive information Advanced Directives 03/08/2021  Does Patient Have a Medical Advance Directive? Yes  Type of Advance Directive Healthcare Power of Attorney  Does patient want to make changes to medical advance directive? No - Patient declined  Copy of Healthcare Power of Attorney in Chart? Yes - validated most recent copy scanned in chart (See row information)  Would patient like information on creating a medical advance directive? -     Chief Complaint  Patient presents with   Follow-up    Ear Lavage     HPI:  Pt is a 85 y.o. female seen today for an acute visit for evaluation of bilateral cerumen impaction.she was here 03/01/2021 noted to have heard cerumen impaction to both ears.she was advised to instil debrox otic solution to both ears twice daily x 4 days then follow up for bilateral ear lavage.Has applied debrox drop as directed.Denies any fever,chills or earache or drainage.   Past Medical History:  Diagnosis Date   Alzheimer disease ([facility])    Asthma    Hyperlipidemia    Hypertension    Pulmonary embolism ([facility]) 2004   was short of breath--was on coumadin for 6 mos to a year   Past Surgical History:  Procedure Laterality Date   ABDOMINAL HYSTERECTOMY     BUNIONECTOMY     REPLACEMENT TOTAL KNEE  1980s, 2003   x2    No Known  Allergies  Outpatient Encounter Medications as of 03/08/2021  Medication Sig   amLODipine (NORVASC) 10 MG tablet TAKE 1 TABLET(10 MG) BY MOUTH DAILY   aspirin 81 MG tablet Take 81 mg by mouth daily.   HYDROcodone-acetaminophen (NORCO/VICODIN) 5-325 MG tablet Take 1 tablet by mouth every 12 (twelve) hours as needed for severe pain.   irbesartan (AVAPRO) 300 MG tablet TAKE 1 TABLET(300 MG) BY MOUTH DAILY   methocarbamol (ROBAXIN) 500 MG tablet Take 1 tablet (500 mg total) by mouth daily.   metoprolol tartrate (LOPRESSOR) 25 MG tablet Take 1 tablet (25 mg total) by mouth 2 (two) times daily.   nitroGLYCERIN (NITROSTAT) 0.4 MG SL tablet Place 1 tablet (0.4 mg total) under the tongue every 5 (five) minutes x 3 doses as needed for chest pain.   nystatin cream (MYCOSTATIN) Apply 1 application topically 2 (two) times daily.   pravastatin (PRAVACHOL) 40 MG tablet TAKE 1 TABLET(40 MG) BY MOUTH DAILY   No facility-administered encounter medications on file as of 03/08/2021.    Review of Systems  Constitutional:  Negative for chills and fever.  HENT:  Negative for congestion, ear discharge, ear pain, hearing loss, postnasal drip, rhinorrhea, sinus pressure, sinus pain, sneezing, sore throat and tinnitus.   Respiratory:  Negative for cough, chest tightness, shortness of breath and wheezing.   Cardiovascular:  Negative for chest pain, palpitations and leg swelling.  Skin:  Negative  for color change, pallor and rash.  Neurological:  Negative for dizziness, light-headedness and headaches.   Immunization History  Administered Date(s) Administered   Influenza, High Dose Seasonal PF 10/27/2018, 07/18/2020   Influenza,inj,Quad PF,6+ Mos 05/27/2015, 06/18/2016   Influenza-Unspecified 05/18/2017   Pneumococcal Conjugate-13 10/11/2014   Pneumococcal Polysaccharide-23 01/20/2016   Tdap 09/17/2010   Zoster, Live 07/22/2014   Pertinent  Health Maintenance Due  Topic Date Due   INFLUENZA VACCINE  04/17/2021    DEXA SCAN  Completed   PNA vac Low Risk Adult  Completed   Fall Risk  03/08/2021 03/01/2021 11/24/2020 11/03/2020 04/25/2020  Falls in the past year? 0 0 1 1 0  Number falls in past yr: 0 0 0 0 0  Comment - - - knee -  Injury with Fall? 0 0 1 0 0   Functional Status Survey:    Vitals:   03/08/21 0822  Height: 5\' 10"  (1.778 m)   Body mass index is 29.41 kg/m. Physical Exam Constitutional:      General: She is not in acute distress.    Appearance: She is overweight. She is not ill-appearing.  HENT:     Head: Normocephalic.     Right Ear: There is impacted cerumen.     Left Ear: There is impacted cerumen.     Ears:     Comments: ear cerumen lavaged with warm water and hydrogen peroxide moderate amounts of cerumen on the right ear and large amounts on the right ear  obtained using alligator forceps and curette.Tolerated procedure well.TM clear without any signs of infection.     Nose: Nose normal. No congestion or rhinorrhea.     Mouth/Throat:     Mouth: Mucous membranes are moist.     Pharynx: Oropharynx is clear. No oropharyngeal exudate or posterior oropharyngeal erythema.  Eyes:     General: No scleral icterus.       Right eye: No discharge.        Left eye: No discharge.     Conjunctiva/sclera: Conjunctivae normal.     Pupils: Pupils are equal, round, and reactive to light.  Cardiovascular:     Rate and Rhythm: Normal rate and regular rhythm.     Pulses: Normal pulses.     Heart sounds: Normal heart sounds. No murmur heard.   No friction rub. No gallop.  Pulmonary:     Effort: Pulmonary effort is normal. No respiratory distress.     Breath sounds: Normal breath sounds. No wheezing, rhonchi or rales.  Chest:     Chest wall: No tenderness.  Musculoskeletal:     Cervical back: Normal range of motion. No tenderness.  Lymphadenopathy:     Cervical: No cervical adenopathy.  Skin:    General: Skin is warm and dry.     Coloration: Skin is not pale.     Findings: No  erythema.  Neurological:     Mental Status: She is alert and oriented to person, place, and time.     Cranial Nerves: No cranial nerve deficit.     Sensory: No sensory deficit.     Coordination: Coordination normal.  Psychiatric:        Mood and Affect: Mood normal.        Behavior: Behavior normal.        Thought Content: Thought content normal.        Judgment: Judgment normal.    Labs reviewed: Recent Labs    09/03/20 0301 09/04/20 0443 09/12/20 0000 10/20/20  0000 11/03/20 1041  NA 130* 132* 134* 136* 136  K 4.1 3.7 4.8 4.8 5.0  CL 97* 96* 98* 99 101  CO2 23 24 22 21 25   GLUCOSE 107* 120*  --   --  86  BUN 13 8 16 16 13   CREATININE 0.83 0.72 0.7 0.8 0.67  CALCIUM 8.7* 9.0 9.5 9.5 9.2  MG  --  1.7  --   --   --    Recent Labs    08/31/20 1412 09/03/20 0301 09/04/20 0443 09/12/20 0000  AST 44* 29 30 17   ALT 23 18 20 16   ALKPHOS 64 44 54 92  BILITOT 1.0 0.8 1.0  --   PROT 6.9 5.7* 6.8  --   ALBUMIN 3.8 3.0* 3.4* 4.0   Recent Labs    09/02/20 0212 09/03/20 0301 09/04/20 0443 09/12/20 0000 10/20/20 0000  WBC 3.4* 2.6* 3.8* 5.5 5.6  NEUTROABS  --   --  2.3 3.50 3.90  HGB 12.9 12.2 13.8 13.6 13.1  HCT 35.4* 34.7* 40.6 40 39  MCV 92.4 94.8 95.3  --   --   PLT 179 171 211 404* 292   Lab Results  Component Value Date   TSH 1.460 09/02/2020   Lab Results  Component Value Date   HGBA1C 6.4 (H) 04/19/2015   Lab Results  Component Value Date   CHOL 192 05/03/2020   HDL 102 05/03/2020   LDLCALC 76 05/03/2020   TRIG 48 05/03/2020   CHOLHDL 1.9 05/03/2020    Significant Diagnostic Results in last 30 days:  No results found.  Assessment/Plan  Bilateral impacted cerumen ear cerumen lavaged with warm water and hydrogen peroxide moderate amounts of cerumen on the right ear and large amounts on the right ear  obtained using alligator forceps and curette.Tolerated procedure well.TM clear without any signs of infection. - Ear Lavage  Family/ staff  Communication: Reviewed plan of care with patient and son verbalized understanding.  Labs/tests ordered: Has lab orders to be done today.  Next Appointment: Has appointment 08/2021   05/05/2020, NP

## 2021-03-15 LAB — DM TEMPLATE

## 2021-03-15 LAB — CBC WITH DIFFERENTIAL/PLATELET
Absolute Monocytes: 461 cells/uL (ref 200–950)
Basophils Absolute: 9 cells/uL (ref 0–200)
Basophils Relative: 0.2 %
Eosinophils Absolute: 80 cells/uL (ref 15–500)
Eosinophils Relative: 1.7 %
HCT: 40 % (ref 35.0–45.0)
Hemoglobin: 13.8 g/dL (ref 11.7–15.5)
Lymphs Abs: 1513 cells/uL (ref 850–3900)
MCH: 34.8 pg — ABNORMAL HIGH (ref 27.0–33.0)
MCHC: 34.5 g/dL (ref 32.0–36.0)
MCV: 101 fL — ABNORMAL HIGH (ref 80.0–100.0)
MPV: 10.3 fL (ref 7.5–12.5)
Monocytes Relative: 9.8 %
Neutro Abs: 2637 cells/uL (ref 1500–7800)
Neutrophils Relative %: 56.1 %
Platelets: 212 10*3/uL (ref 140–400)
RBC: 3.96 10*6/uL (ref 3.80–5.10)
RDW: 12.2 % (ref 11.0–15.0)
Total Lymphocyte: 32.2 %
WBC: 4.7 10*3/uL (ref 3.8–10.8)

## 2021-03-15 LAB — DRUG MONITOR, PANEL 1, W/CONF, URINE
Amphetamines: NEGATIVE ng/mL (ref <500)
Barbiturates: NEGATIVE ng/mL (ref <300)
Benzodiazepines: NEGATIVE ng/mL (ref <100)
Cocaine Metabolite: NEGATIVE ng/mL (ref <150)
Codeine: NEGATIVE ng/mL (ref <50)
Creatinine: 59.6 mg/dL (ref >=20.0)
Hydrocodone: NEGATIVE ng/mL (ref <50)
Hydromorphone: NEGATIVE ng/mL (ref <50)
Marijuana Metabolite: NEGATIVE ng/mL (ref <20)
Methadone Metabolite: NEGATIVE ng/mL (ref <100)
Morphine: NEGATIVE ng/mL (ref <50)
Norhydrocodone: NEGATIVE ng/mL (ref <50)
Opiates: NEGATIVE ng/mL (ref <100)
Oxidant: NEGATIVE ug/mL (ref <200)
Oxycodone: NEGATIVE ng/mL (ref <100)
Phencyclidine: NEGATIVE ng/mL (ref <25)
pH: 6.8 (ref 4.5–9.0)

## 2021-03-15 LAB — COMPLETE METABOLIC PANEL WITH GFR
AG Ratio: 1.5 (calc) (ref 1.0–2.5)
ALT: 9 U/L (ref 6–29)
AST: 14 U/L (ref 10–35)
Albumin: 3.8 g/dL (ref 3.6–5.1)
Alkaline phosphatase (APISO): 54 U/L (ref 37–153)
BUN: 16 mg/dL (ref 7–25)
CO2: 23 mmol/L (ref 20–32)
Calcium: 9.4 mg/dL (ref 8.6–10.4)
Chloride: 99 mmol/L (ref 98–110)
Creat: 0.75 mg/dL (ref 0.60–0.88)
GFR, Est African American: 85 mL/min/{1.73_m2} (ref >=60)
GFR, Est Non African American: 74 mL/min/{1.73_m2} (ref >=60)
Globulin: 2.5 g/dL (calc) (ref 1.9–3.7)
Glucose, Bld: 100 mg/dL — ABNORMAL HIGH (ref 65–99)
Potassium: 4.7 mmol/L (ref 3.5–5.3)
Sodium: 135 mmol/L (ref 135–146)
Total Bilirubin: 0.7 mg/dL (ref 0.2–1.2)
Total Protein: 6.3 g/dL (ref 6.1–8.1)

## 2021-03-15 LAB — LIPID PANEL
Cholesterol: 198 mg/dL (ref <200)
HDL: 105 mg/dL (ref >=50)
LDL Cholesterol (Calc): 79 mg/dL (calc)
Non-HDL Cholesterol (Calc): 93 mg/dL (calc) (ref <130)
Total CHOL/HDL Ratio: 1.9 (calc) (ref <5.0)
Triglycerides: 55 mg/dL (ref <150)

## 2021-03-15 LAB — TSH: TSH: 1.66 mIU/L (ref 0.40–4.50)

## 2021-03-15 LAB — HEMOGLOBIN A1C
Hgb A1c MFr Bld: 5.3 % of total Hgb (ref <5.7)
Mean Plasma Glucose: 105 mg/dL
eAG (mmol/L): 5.8 mmol/L

## 2021-03-15 LAB — TEST AUTHORIZATION

## 2021-03-25 DIAGNOSIS — R2681 Unsteadiness on feet: Secondary | ICD-10-CM | POA: Diagnosis not present

## 2021-03-25 DIAGNOSIS — R2689 Other abnormalities of gait and mobility: Secondary | ICD-10-CM | POA: Diagnosis not present

## 2021-03-25 DIAGNOSIS — R296 Repeated falls: Secondary | ICD-10-CM | POA: Diagnosis not present

## 2021-03-25 DIAGNOSIS — R279 Unspecified lack of coordination: Secondary | ICD-10-CM | POA: Diagnosis not present

## 2021-03-27 ENCOUNTER — Ambulatory Visit (INDEPENDENT_AMBULATORY_CARE_PROVIDER_SITE_OTHER): Payer: Medicare Other | Admitting: Podiatry

## 2021-03-27 ENCOUNTER — Encounter: Payer: Self-pay | Admitting: Podiatry

## 2021-03-27 ENCOUNTER — Other Ambulatory Visit: Payer: Self-pay

## 2021-03-27 DIAGNOSIS — G8929 Other chronic pain: Secondary | ICD-10-CM | POA: Diagnosis not present

## 2021-03-27 DIAGNOSIS — M79674 Pain in right toe(s): Secondary | ICD-10-CM | POA: Diagnosis not present

## 2021-03-27 DIAGNOSIS — B351 Tinea unguium: Secondary | ICD-10-CM | POA: Diagnosis not present

## 2021-03-27 DIAGNOSIS — M79675 Pain in left toe(s): Secondary | ICD-10-CM

## 2021-03-27 DIAGNOSIS — M25512 Pain in left shoulder: Secondary | ICD-10-CM | POA: Diagnosis not present

## 2021-03-27 NOTE — Patient Instructions (Signed)
Search on [employer]:  Shoes for lymphedema.  Shoes for edema.  Stretchable diabetic shoes.  Stretchable shoes for swollen feet.

## 2021-03-30 NOTE — Progress Notes (Signed)
Subjective: Amy Andrade is a pleasant 85 y.o. female patient seen today painful thick toenails that are difficult to trim. Pain interferes with ambulation. Aggravating factors include wearing enclosed shoe gear. Pain is relieved with periodic professional debridement.  Her son is present during today's visit. They voice no new pedal concerns on today's visit.  No Known Allergies  Objective: Physical Exam  General: Amy Andrade is a pleasant 85 y.o. African American female, in NAD. AAO x 3.   Vascular:  Capillary refill time to digits immediate b/l. Palpable pedal pulses b/l LE. Pedal hair sparse. Lower extremity skin temperature gradient within normal limits. No pain with calf compression b/l. Nonpitting edema noted b/l lower extremities.  Dermatological:  Pedal skin with normal turgor, texture and tone b/l lower extremities No open wounds b/l lower extremities No interdigital macerations b/l lower extremities Toenails 1-5 b/l elongated, discolored, dystrophic, thickened, crumbly with subungual debris and tenderness to dorsal palpation.  Musculoskeletal:  Normal muscle strength 5/5 to all lower extremity muscle groups bilaterally. No pain crepitus or joint limitation noted with ROM b/l. Hammertoe(s) noted to the 2-5 bilaterally.  Neurological:  Protective sensation intact 5/5 intact bilaterally with 10g monofilament b/l. Vibratory sensation intact b/l.  Assessment and Plan:  1. Pain due to onychomycosis of toenails of both feet     -Examined patient. -Patient to continue soft, supportive shoe gear daily. -Toenails 1-5 b/l were debrided in length and girth with sterile nail nippers and dremel without iatrogenic bleeding.  -Patient to report any pedal injuries to medical professional immediately. -Patient/POA to call should there be question/concern in the interim.  Return in about 4 months (around 07/28/2021).  Freddie Breech, DPM

## 2021-04-25 DIAGNOSIS — R2681 Unsteadiness on feet: Secondary | ICD-10-CM | POA: Diagnosis not present

## 2021-04-25 DIAGNOSIS — R2689 Other abnormalities of gait and mobility: Secondary | ICD-10-CM | POA: Diagnosis not present

## 2021-04-25 DIAGNOSIS — R279 Unspecified lack of coordination: Secondary | ICD-10-CM | POA: Diagnosis not present

## 2021-04-25 DIAGNOSIS — R296 Repeated falls: Secondary | ICD-10-CM | POA: Diagnosis not present

## 2021-04-29 ENCOUNTER — Other Ambulatory Visit: Payer: Self-pay | Admitting: Family

## 2021-04-29 DIAGNOSIS — I10 Essential (primary) hypertension: Secondary | ICD-10-CM

## 2021-05-23 ENCOUNTER — Other Ambulatory Visit: Payer: Medicare Other

## 2021-05-26 DIAGNOSIS — R296 Repeated falls: Secondary | ICD-10-CM | POA: Diagnosis not present

## 2021-05-26 DIAGNOSIS — R279 Unspecified lack of coordination: Secondary | ICD-10-CM | POA: Diagnosis not present

## 2021-05-26 DIAGNOSIS — R2689 Other abnormalities of gait and mobility: Secondary | ICD-10-CM | POA: Diagnosis not present

## 2021-05-26 DIAGNOSIS — R2681 Unsteadiness on feet: Secondary | ICD-10-CM | POA: Diagnosis not present

## 2021-05-28 ENCOUNTER — Other Ambulatory Visit: Payer: Self-pay | Admitting: Family

## 2021-05-28 DIAGNOSIS — E785 Hyperlipidemia, unspecified: Secondary | ICD-10-CM

## 2021-06-25 DIAGNOSIS — R2689 Other abnormalities of gait and mobility: Secondary | ICD-10-CM | POA: Diagnosis not present

## 2021-06-25 DIAGNOSIS — R296 Repeated falls: Secondary | ICD-10-CM | POA: Diagnosis not present

## 2021-06-25 DIAGNOSIS — R2681 Unsteadiness on feet: Secondary | ICD-10-CM | POA: Diagnosis not present

## 2021-06-25 DIAGNOSIS — R279 Unspecified lack of coordination: Secondary | ICD-10-CM | POA: Diagnosis not present

## 2021-07-26 DIAGNOSIS — R279 Unspecified lack of coordination: Secondary | ICD-10-CM | POA: Diagnosis not present

## 2021-07-26 DIAGNOSIS — R296 Repeated falls: Secondary | ICD-10-CM | POA: Diagnosis not present

## 2021-07-26 DIAGNOSIS — R2689 Other abnormalities of gait and mobility: Secondary | ICD-10-CM | POA: Diagnosis not present

## 2021-07-26 DIAGNOSIS — R2681 Unsteadiness on feet: Secondary | ICD-10-CM | POA: Diagnosis not present

## 2021-07-31 ENCOUNTER — Ambulatory Visit (INDEPENDENT_AMBULATORY_CARE_PROVIDER_SITE_OTHER): Payer: Medicare Other | Admitting: Podiatry

## 2021-07-31 DIAGNOSIS — Z91199 Patient's noncompliance with other medical treatment and regimen due to unspecified reason: Secondary | ICD-10-CM

## 2021-08-01 ENCOUNTER — Other Ambulatory Visit: Payer: Self-pay | Admitting: Family

## 2021-08-01 DIAGNOSIS — G8929 Other chronic pain: Secondary | ICD-10-CM

## 2021-08-01 DIAGNOSIS — M25512 Pain in left shoulder: Secondary | ICD-10-CM

## 2021-08-01 NOTE — Telephone Encounter (Signed)
Patient has request refill on medication "Methocarbamol". Medication last refilled on 03/01/2021. Patient medication has warnings. Medication pend and sent to Abbey Chatters, NP due to PCP Ngetich, Donalee Citrin, NP being out of order. Please Advise.

## 2021-08-06 NOTE — Progress Notes (Signed)
   Complete physical exam  Patient: Amy Andrade   DOB: 07/07/1997   85 y.o. Female  MRN: [id number]  Subjective:    No chief complaint on file.   Amy Andrade is a 85 y.o. female who presents today for a complete physical exam. She reports consuming a {diet types:17450} diet. {types:19826} She generally feels {DESC; WELL/FAIRLY WELL/POORLY:18703}. She reports sleeping {DESC; WELL/FAIRLY WELL/POORLY:18703}. She {does/does not:200015} have additional problems to discuss today.    Most recent fall risk assessment:    03/14/2022   10:42 AM  Fall Risk   Falls in the past year? 0  Number falls in past yr: 0  Injury with Fall? 0  Risk for fall due to : No Fall Risks  Follow up Falls evaluation completed     Most recent depression screenings:    03/14/2022   10:42 AM 02/02/2021   10:46 AM  PHQ 2/9 Scores  PHQ - 2 Score 0 0  PHQ- 9 Score 5     {VISON DENTAL STD PSA (Optional):27386}  {History (Optional):23778}  Patient Care Team: Jessup, Joy, NP as PCP - General (Nurse Practitioner)   Outpatient Medications Prior to Visit  Medication Sig   fluticasone (FLONASE) 50 MCG/ACT nasal spray Place 2 sprays into both nostrils in the morning and at bedtime. After 7 days, reduce to once daily.   norgestimate-ethinyl estradiol (SPRINTEC 28) 0.25-35 MG-MCG tablet Take 1 tablet by mouth daily.   Nystatin POWD Apply liberally to affected area 2 times per day   spironolactone (ALDACTONE) 100 MG tablet Take 1 tablet (100 mg total) by mouth daily.   No facility-administered medications prior to visit.    ROS        Objective:     There were no vitals taken for this visit. {Vitals History (Optional):23777}  Physical Exam   No results found for any visits on 04/19/22. {Show previous labs (optional):23779}    Assessment & Plan:    Routine Health Maintenance and Physical Exam  Immunization History  Administered Date(s) Administered   DTaP 09/20/1999, 11/16/1999,  01/25/2000, 10/10/2000, 04/25/2004   Hepatitis A 02/20/2008, 02/25/2009   Hepatitis B 07/08/1999, 08/15/1999, 01/25/2000   HiB (PRP-OMP) 09/20/1999, 11/16/1999, 01/25/2000, 10/10/2000   IPV 09/20/1999, 11/16/1999, 07/15/2000, 04/25/2004   Influenza,inj,Quad PF,6+ Mos 05/28/2014   Influenza-Unspecified 08/27/2012   MMR 07/15/2001, 04/25/2004   Meningococcal Polysaccharide 02/25/2012   Pneumococcal Conjugate-13 10/10/2000   Pneumococcal-Unspecified 01/25/2000, 04/09/2000   Tdap 02/25/2012   Varicella 07/15/2000, 02/20/2008    Health Maintenance  Topic Date Due   HIV Screening  Never done   Hepatitis C Screening  Never done   INFLUENZA VACCINE  04/17/2022   PAP-Cervical Cytology Screening  04/19/2022 (Originally 07/06/2020)   PAP SMEAR-Modifier  04/19/2022 (Originally 07/06/2020)   TETANUS/TDAP  04/19/2022 (Originally 02/24/2022)   HPV VACCINES  Discontinued   COVID-19 Vaccine  Discontinued    Discussed health benefits of physical activity, and encouraged her to engage in regular exercise appropriate for her age and condition.  Problem List Items Addressed This Visit   None Visit Diagnoses     Annual physical exam    -  Primary   Cervical cancer screening       Need for Tdap vaccination          No follow-ups on file.     Joy Jessup, NP   

## 2021-08-25 DIAGNOSIS — R296 Repeated falls: Secondary | ICD-10-CM | POA: Diagnosis not present

## 2021-08-25 DIAGNOSIS — R2681 Unsteadiness on feet: Secondary | ICD-10-CM | POA: Diagnosis not present

## 2021-08-25 DIAGNOSIS — R279 Unspecified lack of coordination: Secondary | ICD-10-CM | POA: Diagnosis not present

## 2021-08-25 DIAGNOSIS — R2689 Other abnormalities of gait and mobility: Secondary | ICD-10-CM | POA: Diagnosis not present

## 2021-08-30 ENCOUNTER — Other Ambulatory Visit: Payer: Self-pay

## 2021-08-30 ENCOUNTER — Encounter: Payer: Self-pay | Admitting: Family

## 2021-08-30 ENCOUNTER — Ambulatory Visit (INDEPENDENT_AMBULATORY_CARE_PROVIDER_SITE_OTHER): Payer: Medicare Other | Admitting: Family

## 2021-08-30 VITALS — BP 120/80 | HR 66 | Temp 97.3°F | Resp 16 | Ht 70.0 in | Wt 213.6 lb

## 2021-08-30 DIAGNOSIS — M25512 Pain in left shoulder: Secondary | ICD-10-CM | POA: Diagnosis not present

## 2021-08-30 DIAGNOSIS — I10 Essential (primary) hypertension: Secondary | ICD-10-CM

## 2021-08-30 DIAGNOSIS — G8929 Other chronic pain: Secondary | ICD-10-CM

## 2021-08-30 DIAGNOSIS — B372 Candidiasis of skin and nail: Secondary | ICD-10-CM

## 2021-08-30 DIAGNOSIS — E785 Hyperlipidemia, unspecified: Secondary | ICD-10-CM

## 2021-08-30 DIAGNOSIS — H543 Unqualified visual loss, both eyes: Secondary | ICD-10-CM | POA: Diagnosis not present

## 2021-08-30 DIAGNOSIS — J4520 Mild intermittent asthma, uncomplicated: Secondary | ICD-10-CM

## 2021-08-30 DIAGNOSIS — S72402S Unspecified fracture of lower end of left femur, sequela: Secondary | ICD-10-CM | POA: Diagnosis not present

## 2021-08-30 DIAGNOSIS — Z23 Encounter for immunization: Secondary | ICD-10-CM

## 2021-08-30 MED ORDER — HYDROCODONE-ACETAMINOPHEN 5-325 MG PO TABS: 1.0000 | ORAL_TABLET | Freq: Two times a day (BID) | ORAL | 0 refills | Status: DC | PRN

## 2021-08-30 MED ORDER — NYSTATIN 100000 UNIT/GM EX CREA: 1.0000 "application " | TOPICAL_CREAM | Freq: Two times a day (BID) | CUTANEOUS | 0 refills | Status: DC

## 2021-08-30 NOTE — Progress Notes (Signed)
Provider: [name]   Amy Andrade, Nelda Bucks, NP  Patient Care Team: Mandy Fitzwater, Nelda Bucks, NP as PCP - General (Family Medicine) Marylynn Pearson, MD as Consulting Physician (Ophthalmology)  Extended Emergency Contact Information Primary Emergency Contact: [name],[name] Address: [address] [place], [place] [address] [name] Phone: [phone] Relation: Other Secondary Emergency Contact: Stankiewicz,[name] Address: [place], [place] [address] [name] Phone: [phone] Relation: Son  Code Status:  Full Code  Goals of care: Advanced Directive information Advanced Directives 08/30/2021  Does Patient Have a Medical Advance Directive? Yes  Type of Advance Directive [place]  Does patient want to make changes to medical advance directive? No - Patient declined  Copy of [place] in Chart? Yes - validated most recent copy scanned in chart (See row information)  Would patient like information on creating a medical advance directive? -     Chief Complaint  Patient presents with   Medical Management of Chronic Issues    6 month follow up.   Immunizations    Discuss the need for Covid vaccine, Shingrix vaccine, Tetanus vaccine, and Influenza vaccine.     HPI:  Pt is a 85 y.o. female seen today for 6 months follow up for medical management of chronic diseases. She is due for COVID-19,shingles and Tetanus and influenza vaccine.  Complains of right leg swelling and pain on right ankle.   Worsening peripheral vision  Right shoulder pain request refill for hydrocodone.   No weight loss appetite has been good.eating three meals per day.  Family assist with her ADL's.    Past Medical History:  Diagnosis Date   Alzheimer disease ([place])    Asthma    Hyperlipidemia    Hypertension    Pulmonary embolism ([place]) 2004   was short of breath--was on coumadin  for 6 mos to a year   Past Surgical History:  Procedure Laterality Date   ABDOMINAL HYSTERECTOMY     BUNIONECTOMY     REPLACEMENT TOTAL KNEE  1980s, 2003   x2    No Known Allergies  Allergies as of 08/30/2021   No Known Allergies      Medication List        Accurate as of August 30, 2021  3:34 PM. If you have any questions, ask your nurse or doctor.          amLODipine 10 MG tablet Commonly known as: NORVASC TAKE 1 TABLET(10 MG) BY MOUTH DAILY   aspirin 81 MG tablet Take 81 mg by mouth daily.   bupivacaine 0.25 % injection Commonly known as: MARCAINE Inject into the skin.   HYDROcodone-acetaminophen 5-325 MG tablet Commonly known as: NORCO/VICODIN Take 1 tablet by mouth every 12 (twelve) hours as needed for severe pain.   irbesartan 300 MG tablet Commonly known as: AVAPRO TAKE 1 TABLET(300 MG) BY MOUTH DAILY   methocarbamol 500 MG tablet Commonly known as: ROBAXIN TAKE 1 TABLET(500 MG) BY MOUTH DAILY   metoprolol tartrate 25 MG tablet Commonly known as: LOPRESSOR Take 1 tablet (25 mg total) by mouth 2 (two) times daily.   nitroGLYCERIN 0.4 MG SL tablet Commonly known as: NITROSTAT Place 1 tablet (0.4 mg total) under the tongue every 5 (five) minutes x 3 doses as needed for chest pain.  nystatin cream Commonly known as: MYCOSTATIN Apply 1 application topically 2 (two) times daily.   pravastatin 40 MG tablet Commonly known as: PRAVACHOL TAKE 1 TABLET(40 MG) BY MOUTH DAILY   triamcinolone acetonide 40 MG/ML Susp Commonly known as: TRIESENCE Inject into the articular space.        Review of Systems  Constitutional:  Negative for appetite change, chills, fatigue, fever and unexpected weight change.  HENT:  Negative for congestion, dental problem, ear discharge, ear pain, facial swelling, hearing loss, nosebleeds, postnasal drip, rhinorrhea, sinus pressure, sinus pain, sneezing, sore throat, tinnitus and trouble swallowing.   Eyes:  Positive  for visual disturbance. Negative for pain, discharge, redness and itching.       Wears eye glasses.  Respiratory:  Negative for cough, chest tightness, shortness of breath and wheezing.   Cardiovascular:  Positive for leg swelling. Negative for chest pain and palpitations.  Gastrointestinal:  Negative for abdominal distention, abdominal pain, blood in stool, constipation, diarrhea, nausea and vomiting.  Endocrine: Negative for cold intolerance, heat intolerance, polydipsia, polyphagia and polyuria.  Genitourinary:  Negative for difficulty urinating, dysuria, flank pain, frequency and urgency.  Musculoskeletal:  Positive for arthralgias and gait problem. Negative for back pain, joint swelling, myalgias, neck pain and neck stiffness.       Left shoulder pain   Skin:  Negative for color change, pallor, rash and wound.  Neurological:  Negative for dizziness, syncope, speech difficulty, weakness, light-headedness, numbness and headaches.  Hematological:  Does not bruise/bleed easily.  Psychiatric/Behavioral:  Negative for agitation, behavioral problems, confusion, hallucinations, self-injury, sleep disturbance and suicidal ideas. The patient is not nervous/anxious.        Memory short term worsening.   Immunization History  Administered Date(s) Administered   Fluad Quad(high Dose 65+) 08/30/2021   Influenza, High Dose Seasonal PF 10/27/2018, 07/18/2020   Influenza,inj,Quad PF,6+ Mos 05/27/2015, 06/18/2016   Influenza-Unspecified 05/18/2017   Pneumococcal Conjugate-13 10/11/2014   Pneumococcal Polysaccharide-23 01/20/2016   Tdap 09/17/2010   Zoster, Live 07/22/2014   Pertinent  Health Maintenance Due  Topic Date Due   INFLUENZA VACCINE  Completed   DEXA SCAN  Completed   Fall Risk 11/03/2020 11/24/2020 03/01/2021 03/08/2021 08/30/2021  Falls in the past year? 1 1 0 0 0  Number of falls in past year knee - - - -  Was there an injury with Fall? 0 1 0 0 0  Fall Risk Category Calculator 1 2 0 0  0  Fall Risk Category Low Moderate Low Low Low  Patient Fall Risk Level Low fall risk Moderate fall risk Low fall risk Low fall risk Low fall risk  Patient at Risk for Falls Due to - - - - No Fall Risks  Fall risk Follow up - - - - Falls evaluation completed   Functional Status Survey:    Vitals:   08/30/21 1503  BP: 120/80  Pulse: 66  Resp: 16  Temp: (!) 97.3 F (36.3 C)  SpO2: 96%  Weight: 213 lb 9.6 oz (96.9 kg)  Height: $Remove'5\' 10"'KbIWnKG$  (1.778 m)   Body mass index is 30.65 kg/m. Physical Exam Vitals reviewed.  Constitutional:      General: She is not in acute distress.    Appearance: Normal appearance. She is obese. She is not ill-appearing or diaphoretic.  HENT:     Head: Normocephalic.     Right Ear: Tympanic membrane, ear canal and external ear normal. There is no impacted cerumen.     Left Ear: Tympanic  membrane, ear canal and external ear normal. There is no impacted cerumen.     Nose: Nose normal. No congestion or rhinorrhea.     Mouth/Throat:     Mouth: Mucous membranes are moist.     Pharynx: Oropharynx is clear. No oropharyngeal exudate or posterior oropharyngeal erythema.  Eyes:     General: No scleral icterus.       Right eye: No discharge.        Left eye: No discharge.     Extraocular Movements: Extraocular movements intact.     Conjunctiva/sclera: Conjunctivae normal.     Pupils: Pupils are equal, round, and reactive to light.  Neck:     Vascular: No carotid bruit.  Cardiovascular:     Rate and Rhythm: Normal rate and regular rhythm.     Pulses: Normal pulses.     Heart sounds: Normal heart sounds. No murmur heard.   No friction rub. No gallop.  Pulmonary:     Effort: Pulmonary effort is normal. No respiratory distress.     Breath sounds: Normal breath sounds. No wheezing, rhonchi or rales.  Chest:     Chest wall: No tenderness.  Abdominal:     General: Bowel sounds are normal. There is no distension.     Palpations: Abdomen is soft. There is no mass.      Tenderness: There is no abdominal tenderness. There is no right CVA tenderness, left CVA tenderness, guarding or rebound.  Musculoskeletal:        General: No swelling or tenderness.     Right shoulder: Normal.     Left shoulder: No swelling. Decreased range of motion. Normal strength. Normal pulse.     Cervical back: Normal range of motion. No rigidity or tenderness.     Right lower leg: No edema.     Left lower leg: No edema.     Comments: Unsteady gait   Lymphadenopathy:     Cervical: No cervical adenopathy.  Skin:    General: Skin is warm and dry.     Coloration: Skin is not pale.     Findings: No bruising, erythema, lesion or rash.  Neurological:     Mental Status: She is alert. Mental status is at baseline.     Cranial Nerves: No cranial nerve deficit.     Sensory: No sensory deficit.     Motor: No weakness.     Coordination: Coordination normal.     Gait: Gait abnormal.  Psychiatric:        Mood and Affect: Mood normal.        Speech: Speech normal.        Behavior: Behavior normal.        Thought Content: Thought content normal.        Judgment: Judgment normal.    Labs reviewed: Recent Labs    09/04/20 0443 09/12/20 0000 10/20/20 0000 11/03/20 1041 03/08/21 0910  NA 132*   < > 136* 136 135  K 3.7   < > 4.8 5.0 4.7  CL 96*   < > 99 101 99  CO2 24   < > $R'21 25 23  'BW$ GLUCOSE 120*  --   --  86 100*  BUN 8   < > $R'16 13 16  'rm$ CREATININE 0.72   < > 0.8 0.67 0.75  CALCIUM 9.0   < > 9.5 9.2 9.4  MG 1.7  --   --   --   --    < > =  values in this interval not displayed.   Recent Labs    09/03/20 0301 09/04/20 0443 09/12/20 0000 03/08/21 0910  AST $Re'29 30 17 14  'WYb$ ALT $R'18 20 16 9  'jN$ ALKPHOS 44 54 92  --   BILITOT 0.8 1.0  --  0.7  PROT 5.7* 6.8  --  6.3  ALBUMIN 3.0* 3.4* 4.0  --    Recent Labs    09/03/20 0301 09/04/20 0443 09/12/20 0000 10/20/20 0000 03/08/21 0910  WBC 2.6* 3.8* 5.5 5.6 4.7  NEUTROABS  --  2.3 3.50 3.90 2,637  HGB 12.2 13.8 13.6 13.1 13.8   HCT 34.7* 40.6 40 39 40.0  MCV 94.8 95.3  --   --  101.0*  PLT 171 211 404* 292 212   Lab Results  Component Value Date   TSH 1.66 03/08/2021   Lab Results  Component Value Date   HGBA1C 5.3 03/08/2021   Lab Results  Component Value Date   CHOL 198 03/08/2021   HDL 105 03/08/2021   LDLCALC 79 03/08/2021   TRIG 55 03/08/2021   CHOLHDL 1.9 03/08/2021    Significant Diagnostic Results in last 30 days:  No results found.  Assessment/Plan  1. Need for influenza vaccination Flut shot administered by CMA no acute reaction reported.  - Flu Vaccine QUAD High Dose(Fluad)  2. Chronic left shoulder pain - continue current pain regimen - HYDROcodone-acetaminophen (NORCO/VICODIN) 5-325 MG tablet; Take 1 tablet by mouth every 12 (twelve) hours as needed for severe pain.  Dispense: 30 tablet; Refill: 0  3. Closed fracture of distal end of left femur, unspecified fracture morphology, sequela - Pain under controlled  - HYDROcodone-acetaminophen (NORCO/VICODIN) 5-325 MG tablet; Take 1 tablet by mouth every 12 (twelve) hours as needed for severe pain.  Dispense: 30 tablet; Refill: 0  4. Essential hypertension B/p stable  - continue on metoprolol,irbesartan and amlodipine  - CBC with Differential/Platelet; Future - CMP with eGFR([facility]); Future - TSH; Future  5. Candidiasis of skin Apply Nystatin to left breast fold - nystatin cream (MYCOSTATIN); Apply 1 application topically 2 (two) times daily.  Dispense: 30 g; Refill: 0  6. Hyperlipidemia, unspecified hyperlipidemia type Continue on pravastatin  - Lipid panel; Future  7. Mild intermittent asthma without complication Stable.  8. Impaired vision in both eyes Will refer to ophthalmology  - Ambulatory referral to Ophthalmology  Family/ staff Communication: Reviewed plan of care with patient and son verbalized understanding.  Labs/tests ordered: None   Next Appointment : 6 months for medical management of chronic  issues.Fasting Labs prior in 1 week or sooner.    Sandrea Hughs, NP

## 2021-08-30 NOTE — Patient Instructions (Signed)
continue current medication

## 2021-08-31 ENCOUNTER — Ambulatory Visit: Payer: Medicare Other | Admitting: Family

## 2021-09-04 ENCOUNTER — Other Ambulatory Visit: Payer: Medicare Other

## 2021-09-04 ENCOUNTER — Other Ambulatory Visit: Payer: Self-pay

## 2021-09-04 DIAGNOSIS — E785 Hyperlipidemia, unspecified: Secondary | ICD-10-CM | POA: Diagnosis not present

## 2021-09-04 DIAGNOSIS — I10 Essential (primary) hypertension: Secondary | ICD-10-CM | POA: Diagnosis not present

## 2021-09-05 LAB — COMPLETE METABOLIC PANEL WITH GFR
AG Ratio: 1.7 (calc) (ref 1.0–2.5)
ALT: 10 U/L (ref 6–29)
AST: 15 U/L (ref 10–35)
Albumin: 4.3 g/dL (ref 3.6–5.1)
Alkaline phosphatase (APISO): 63 U/L (ref 37–153)
BUN: 10 mg/dL (ref 7–25)
CO2: 21 mmol/L (ref 20–32)
Calcium: 9.5 mg/dL (ref 8.6–10.4)
Chloride: 95 mmol/L — ABNORMAL LOW (ref 98–110)
Creat: 0.8 mg/dL (ref 0.60–0.95)
Globulin: 2.6 g/dL (calc) (ref 1.9–3.7)
Glucose, Bld: 99 mg/dL (ref 65–99)
Potassium: 4.5 mmol/L (ref 3.5–5.3)
Sodium: 129 mmol/L — ABNORMAL LOW (ref 135–146)
Total Bilirubin: 0.6 mg/dL (ref 0.2–1.2)
Total Protein: 6.9 g/dL (ref 6.1–8.1)
eGFR: 73 mL/min/{1.73_m2} (ref >=60)

## 2021-09-05 LAB — LIPID PANEL
Cholesterol: 193 mg/dL (ref <200)
HDL: 114 mg/dL (ref >=50)
LDL Cholesterol (Calc): 66 mg/dL (calc)
Non-HDL Cholesterol (Calc): 79 mg/dL (calc) (ref <130)
Total CHOL/HDL Ratio: 1.7 (calc) (ref <5.0)
Triglycerides: 57 mg/dL (ref <150)

## 2021-09-05 LAB — CBC WITH DIFFERENTIAL/PLATELET
Absolute Monocytes: 541 cells/uL (ref 200–950)
Basophils Absolute: 20 cells/uL (ref 0–200)
Basophils Relative: 0.4 %
Eosinophils Absolute: 82 cells/uL (ref 15–500)
Eosinophils Relative: 1.6 %
HCT: 41.6 % (ref 35.0–45.0)
Hemoglobin: 14.6 g/dL (ref 11.7–15.5)
Lymphs Abs: 1816 cells/uL (ref 850–3900)
MCH: 34.8 pg — ABNORMAL HIGH (ref 27.0–33.0)
MCHC: 35.1 g/dL (ref 32.0–36.0)
MCV: 99.3 fL (ref 80.0–100.0)
MPV: 10.7 fL (ref 7.5–12.5)
Monocytes Relative: 10.6 %
Neutro Abs: 2642 cells/uL (ref 1500–7800)
Neutrophils Relative %: 51.8 %
Platelets: 223 10*3/uL (ref 140–400)
RBC: 4.19 10*6/uL (ref 3.80–5.10)
RDW: 11.8 % (ref 11.0–15.0)
Total Lymphocyte: 35.6 %
WBC: 5.1 10*3/uL (ref 3.8–10.8)

## 2021-09-05 LAB — TSH: TSH: 2.1 mIU/L (ref 0.40–4.50)

## 2021-09-07 DIAGNOSIS — M19012 Primary osteoarthritis, left shoulder: Secondary | ICD-10-CM | POA: Diagnosis not present

## 2021-09-12 ENCOUNTER — Other Ambulatory Visit: Payer: Self-pay

## 2021-09-12 DIAGNOSIS — E871 Hypo-osmolality and hyponatremia: Secondary | ICD-10-CM

## 2021-09-19 ENCOUNTER — Other Ambulatory Visit: Payer: Medicare Other

## 2021-09-19 ENCOUNTER — Other Ambulatory Visit: Payer: Self-pay

## 2021-09-19 DIAGNOSIS — E871 Hypo-osmolality and hyponatremia: Secondary | ICD-10-CM | POA: Diagnosis not present

## 2021-09-19 LAB — BASIC METABOLIC PANEL WITH GFR
BUN: 13 mg/dL (ref 7–25)
CO2: 29 mmol/L (ref 20–32)
Calcium: 10.1 mg/dL (ref 8.6–10.4)
Chloride: 97 mmol/L — ABNORMAL LOW (ref 98–110)
Creat: 0.86 mg/dL (ref 0.60–0.95)
Glucose, Bld: 119 mg/dL (ref 65–139)
Potassium: 4.9 mmol/L (ref 3.5–5.3)
Sodium: 135 mmol/L (ref 135–146)
eGFR: 67 mL/min/{1.73_m2} (ref >=60)

## 2021-09-25 DIAGNOSIS — R2689 Other abnormalities of gait and mobility: Secondary | ICD-10-CM | POA: Diagnosis not present

## 2021-09-25 DIAGNOSIS — R279 Unspecified lack of coordination: Secondary | ICD-10-CM | POA: Diagnosis not present

## 2021-09-25 DIAGNOSIS — R296 Repeated falls: Secondary | ICD-10-CM | POA: Diagnosis not present

## 2021-09-25 DIAGNOSIS — R2681 Unsteadiness on feet: Secondary | ICD-10-CM | POA: Diagnosis not present

## 2021-10-27 ENCOUNTER — Other Ambulatory Visit: Payer: Self-pay | Admitting: Family

## 2021-10-27 DIAGNOSIS — I10 Essential (primary) hypertension: Secondary | ICD-10-CM

## 2021-11-06 ENCOUNTER — Other Ambulatory Visit: Payer: Self-pay | Admitting: Family

## 2021-11-06 ENCOUNTER — Other Ambulatory Visit: Payer: Medicare Other

## 2021-11-06 DIAGNOSIS — Z780 Asymptomatic menopausal state: Secondary | ICD-10-CM

## 2021-11-26 ENCOUNTER — Other Ambulatory Visit: Payer: Self-pay | Admitting: Family

## 2021-11-26 DIAGNOSIS — E785 Hyperlipidemia, unspecified: Secondary | ICD-10-CM

## 2021-11-28 ENCOUNTER — Other Ambulatory Visit: Payer: Self-pay

## 2021-11-28 ENCOUNTER — Encounter: Payer: Self-pay | Admitting: Family

## 2021-11-28 ENCOUNTER — Ambulatory Visit (INDEPENDENT_AMBULATORY_CARE_PROVIDER_SITE_OTHER): Payer: Medicare Other | Admitting: Family

## 2021-11-28 DIAGNOSIS — Z23 Encounter for immunization: Secondary | ICD-10-CM | POA: Diagnosis not present

## 2021-11-28 DIAGNOSIS — Z0000 Encounter for general adult medical examination without abnormal findings: Secondary | ICD-10-CM | POA: Diagnosis not present

## 2021-11-28 MED ORDER — TETANUS-DIPHTH-ACELL PERTUSSIS 5-2.5-18.5 LF-MCG/0.5 IM SUSP: 0.5000 mL | Freq: Once | INTRAMUSCULAR | 0 refills | Status: AC

## 2021-11-28 NOTE — Patient Instructions (Signed)
Ms. Chawla , ?Thank you for taking time to come for your Medicare Wellness Visit. I appreciate your ongoing commitment to your health goals. Please review the following plan we discussed and let me know if I can assist you in the future.  ? ?Screening recommendations/referrals: ?Colonoscopy N/A ?Mammogram N/A ?Bone Density : due 03/2022  ?Recommended yearly ophthalmology/optometry visit for glaucoma screening and checkup ?Recommended yearly dental visit for hygiene and checkup ? ?Vaccinations: ?Influenza vaccine : Up to date  ?Pneumococcal vaccine : Up-to-date ?Tdap vaccine: Advised to get Tdap vaccine at the pharmacy ?Shingles vaccine: Advised to get Shingrix vaccine at the pharmacy ? ?Advanced directives: Yes ? ?Conditions/risks identified: Cardiac Risk Factors include: advanced age (>43men, >10 women);hypertension;family history of premature cardiovascular disease;obesity (BMI >30kg/m2) ? ?Next appointment: 1 year for annual wellness visit ? ? ?Preventive Care 33 Years and Older, Female ?Preventive care refers to lifestyle choices and visits with your health care provider that can promote health and wellness. ?What does preventive care include? ?A yearly physical exam. This is also called an annual well check. ?Dental exams once or twice a year. ?Routine eye exams. Ask your health care provider how often you should have your eyes checked. ?Personal lifestyle choices, including: ?Daily care of your teeth and gums. ?Regular physical activity. ?Eating a healthy diet. ?Avoiding tobacco and drug use. ?Limiting alcohol use. ?Practicing safe sex. ?Taking low-dose aspirin every day. ?Taking vitamin and mineral supplements as recommended by your health care provider. ?What happens during an annual well check? ?The services and screenings done by your health care provider during your annual well check will depend on your age, overall health, lifestyle risk factors, and family history of disease. ?Counseling  ?Your health  care provider may ask you questions about your: ?Alcohol use. ?Tobacco use. ?Drug use. ?Emotional well-being. ?Home and relationship well-being. ?Sexual activity. ?Eating habits. ?History of falls. ?Memory and ability to understand (cognition). ?Work and work Astronomer. ?Reproductive health. ?Screening  ?You may have the following tests or measurements: ?Height, weight, and BMI. ?Blood pressure. ?Lipid and cholesterol levels. These may be checked every 5 years, or more frequently if you are over 70 years old. ?Skin check. ?Lung cancer screening. You may have this screening every year starting at age 7 if you have a 30-pack-year history of smoking and currently smoke or have quit within the past 15 years. ?Fecal occult blood test (FOBT) of the stool. You may have this test every year starting at age 21. ?Flexible sigmoidoscopy or colonoscopy. You may have a sigmoidoscopy every 5 years or a colonoscopy every 10 years starting at age 24. ?Hepatitis C blood test. ?Hepatitis B blood test. ?Sexually transmitted disease (STD) testing. ?Diabetes screening. This is done by checking your blood sugar (glucose) after you have not eaten for a while (fasting). You may have this done every 1-3 years. ?Bone density scan. This is done to screen for osteoporosis. You may have this done starting at age 50. ?Mammogram. This may be done every 1-2 years. Talk to your health care provider about how often you should have regular mammograms. ?Talk with your health care provider about your test results, treatment options, and if necessary, the need for more tests. ?Vaccines  ?Your health care provider may recommend certain vaccines, such as: ?Influenza vaccine. This is recommended every year. ?Tetanus, diphtheria, and acellular pertussis (Tdap, Td) vaccine. You may need a Td booster every 10 years. ?Zoster vaccine. You may need this after age 29. ?Pneumococcal 13-valent conjugate (PCV13) vaccine. One  dose is recommended after age  58. ?Pneumococcal polysaccharide (PPSV23) vaccine. One dose is recommended after age 35. ?Talk to your health care provider about which screenings and vaccines you need and how often you need them. ?This information is not intended to replace advice given to you by your health care provider. Make sure you discuss any questions you have with your health care provider. ?Document Released: 09/30/2015 Document Revised: 05/23/2016 Document Reviewed: 07/05/2015 ?Elsevier Interactive Patient Education ? 2017 [place]. ? ?Fall Prevention in the Home ?Falls can cause injuries. They can happen to people of all ages. There are many things you can do to make your home safe and to help prevent falls. ?What can I do on the outside of my home? ?Regularly fix the edges of walkways and driveways and fix any cracks. ?Remove anything that might make you trip as you walk through a door, such as a raised step or threshold. ?Trim any bushes or trees on the path to your home. ?Use bright outdoor lighting. ?Clear any walking paths of anything that might make someone trip, such as rocks or tools. ?Regularly check to see if handrails are loose or broken. Make sure that both sides of any steps have handrails. ?Any raised decks and porches should have guardrails on the edges. ?Have any leaves, snow, or ice cleared regularly. ?Use sand or salt on walking paths during winter. ?Clean up any spills in your garage right away. This includes oil or grease spills. ?What can I do in the bathroom? ?Use night lights. ?Install grab bars by the toilet and in the tub and shower. Do not use towel bars as grab bars. ?Use non-skid mats or decals in the tub or shower. ?If you need to sit down in the shower, use a plastic, non-slip stool. ?Keep the floor dry. Clean up any water that spills on the floor as soon as it happens. ?Remove soap buildup in the tub or shower regularly. ?Attach bath mats securely with double-sided non-slip rug tape. ?Do not have throw  rugs and other things on the floor that can make you trip. ?What can I do in the bedroom? ?Use night lights. ?Make sure that you have a light by your bed that is easy to reach. ?Do not use any sheets or blankets that are too big for your bed. They should not hang down onto the floor. ?Have a firm chair that has side arms. You can use this for support while you get dressed. ?Do not have throw rugs and other things on the floor that can make you trip. ?What can I do in the kitchen? ?Clean up any spills right away. ?Avoid walking on wet floors. ?Keep items that you use a lot in easy-to-reach places. ?If you need to reach something above you, use a strong step stool that has a grab bar. ?Keep electrical cords out of the way. ?Do not use floor polish or wax that makes floors slippery. If you must use wax, use non-skid floor wax. ?Do not have throw rugs and other things on the floor that can make you trip. ?What can I do with my stairs? ?Do not leave any items on the stairs. ?Make sure that there are handrails on both sides of the stairs and use them. Fix handrails that are broken or loose. Make sure that handrails are as long as the stairways. ?Check any carpeting to make sure that it is firmly attached to the stairs. Fix any carpet that is loose or worn. ?  Avoid having throw rugs at the top or bottom of the stairs. If you do have throw rugs, attach them to the floor with carpet tape. ?Make sure that you have a light switch at the top of the stairs and the bottom of the stairs. If you do not have them, ask someone to add them for you. ?What else can I do to help prevent falls? ?Wear shoes that: ?Do not have high heels. ?Have rubber bottoms. ?Are comfortable and fit you well. ?Are closed at the toe. Do not wear sandals. ?If you use a stepladder: ?Make sure that it is fully opened. Do not climb a closed stepladder. ?Make sure that both sides of the stepladder are locked into place. ?Ask someone to hold it for you, if  possible. ?Clearly mark and make sure that you can see: ?Any grab bars or handrails. ?First and last steps. ?Where the edge of each step is. ?Use tools that help you move around (mobility aids) if they are needed. Lowella Dandy

## 2021-11-28 NOTE — Progress Notes (Signed)
This service is provided via telemedicine  No vital signs collected/recorded due to the encounter was a telemedicine visit.   Location of patient (ex: home, work):  Home.  Patient consents to a telephone visit:  Yes  Location of the provider (ex: office, home):  [employer].   Name of any referring provider:  Ahmya Bernick, Donalee Citrin, NP   Names of all persons participating in the telemedicine service and their role in the encounter:  Patient, Son "Clovis Riley Caster", [place], RMA, Pailynn Vahey, [place], NP.    Time spent on call:  8 minutes spent on the phone with Medical Assistant.       Subjective:   Amy Andrade is a 86 y.o. female who presents for Medicare Annual (Subsequent) preventive examination.  Review of Systems     Cardiac Risk Factors include: advanced age (>76men, >31 women);hypertension;family history of premature cardiovascular disease;obesity (BMI >30kg/m2)     Objective:    Today's Vitals   11/28/21 1350  PainSc: 6    There is no height or weight on file to calculate BMI.  Advanced Directives 11/28/2021 08/30/2021 03/08/2021 03/01/2021 12/08/2020 11/24/2020 11/03/2020  Does Patient Have a Medical Advance Directive? Yes Yes Yes Yes Yes Yes Yes  Type of Social research officer, government Power of [employer] Power of [employer] Power of [employer] Power of [employer] Power of [place];Living will  Does patient want to make changes to medical advance directive? No - Patient declined No - Patient declined No - Patient declined No - Patient declined - No - Patient declined No - Patient declined  Copy of Healthcare Power of Attorney in Chart? Yes - validated most recent copy scanned in chart (See row information) Yes - validated most recent copy scanned in chart (See row information) Yes - validated most recent copy scanned in chart (See row information) Yes - validated most  recent copy scanned in chart (See row information) Yes - validated most recent copy scanned in chart (See row information) Yes - validated most recent copy scanned in chart (See row information) No - copy requested  Would patient like information on creating a medical advance directive? - - - - - - -    Current Medications (verified) Outpatient Encounter Medications as of 11/28/2021  Medication Sig   amLODipine (NORVASC) 10 MG tablet TAKE 1 TABLET(10 MG) BY MOUTH DAILY   aspirin 81 MG tablet Take 81 mg by mouth daily.   HYDROcodone-acetaminophen (NORCO/VICODIN) 5-325 MG tablet Take 1 tablet by mouth every 12 (twelve) hours as needed for severe pain.   irbesartan (AVAPRO) 300 MG tablet TAKE 1 TABLET(300 MG) BY MOUTH DAILY   methocarbamol (ROBAXIN) 500 MG tablet TAKE 1 TABLET(500 MG) BY MOUTH DAILY   metoprolol tartrate (LOPRESSOR) 25 MG tablet Take 1 tablet (25 mg total) by mouth 2 (two) times daily.   nitroGLYCERIN (NITROSTAT) 0.4 MG SL tablet Place 1 tablet (0.4 mg total) under the tongue every 5 (five) minutes x 3 doses as needed for chest pain.   nystatin cream (MYCOSTATIN) Apply 1 application topically 2 (two) times daily.   pravastatin (PRAVACHOL) 40 MG tablet TAKE 1 TABLET(40 MG) BY MOUTH DAILY   [DISCONTINUED] bupivacaine (MARCAINE) 0.25 % injection Inject into the skin.   [DISCONTINUED] triamcinolone acetonide (TRIESENCE) 40 MG/ML SUSP Inject into the articular space.   No facility-administered encounter medications on file as of 11/28/2021.    Allergies (verified) Patient has no known allergies.  History: Past Medical History:  Diagnosis Date   Alzheimer disease (HCC)    Asthma    Hyperlipidemia    Hypertension    Pulmonary embolism (HCC) 2004   was short of breath--was on coumadin for 6 mos to a year   Past Surgical History:  Procedure Laterality Date   ABDOMINAL HYSTERECTOMY     BUNIONECTOMY     REPLACEMENT TOTAL KNEE  1980s, 2003   x2   Family History  Problem  Relation Age of Onset   Alzheimer's disease Mother    Chronic bronchitis Mother    Heart disease Father    High blood pressure Father    High blood pressure Brother    Asthma Daughter    High blood pressure Son    High Cholesterol Son    Post-traumatic stress disorder Son    Social History   Socioeconomic History   Marital status: Widowed    Spouse name: Not on file   Number of children: 2   Years of education: Not on file   Highest education level: Not on file  Occupational History   Not on file  Tobacco Use   Smoking status: Former    Packs/day: 0.25    Years: 4.00    Pack years: 1.00    Types: Cigarettes    Quit date: 09/17/1957    Years since quitting: 64.2   Smokeless tobacco: Never  Vaping Use   Vaping Use: Never used  Substance and Sexual Activity   Alcohol use: Yes    Alcohol/week: 2.0 standard drinks    Types: 2 Glasses of wine per week    Comment: 1 4oz cup with meals    Drug use: No   Sexual activity: Not Currently  Other Topics Concern   Not on file  Social History Narrative   Diet: Eats well      Do you drink/ eat things with caffeine?Yes, 2 cups of coffee a day      Marital status:    Married--widowed in 2017                           What year were you married ? 1966      Do you live in a house, apartment,assistred living, condo, trailer, etc.)? House      Is it one or more stories? 2      How many persons live in your home ?  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  Yes                            Type & how often: Stationary bike 2 x/week      Do you have a living will? Yes      Do you have a DNR form?  Yes                     If not, do you want to discuss one?       Do you have signed POA?HPOA forms?   Yes              If so, please bring to your        appointment      Social Determinants  of Health   Financial Resource Strain: Not on file   Food Insecurity: Not on file  Transportation Needs: Not on file  Physical Activity: Not on file  Stress: Not on file  Social Connections: Not on file    Tobacco Counseling Counseling given: Not Answered   Clinical Intake:  Pre-visit preparation completed: No  Pain : 0-10 Pain Score: 6  Pain Type: Chronic pain Pain Location: Shoulder Pain Orientation: Left Pain Descriptors / Indicators: Aching, Sharp Pain Onset: Other (comment) (several years) Pain Frequency: Intermittent Pain Relieving Factors: Norco,Robaxin Effect of Pain on Daily Activities: yes.cannot reach  Pain Relieving Factors: Norco,Robaxin  BMI - recorded: 30.65 Nutritional Status: BMI > 30  Obese Nutritional Risks: None Diabetes: No  How often do you need to have someone help you when you read instructions, pamphlets, or other written materials from your doctor or pharmacy?: 1 - Never What is the last grade level you completed in school?: Doctorate  Diabetic?NO   Interpreter Needed?: No      Activities of Daily Living In your present state of health, do you have any difficulty performing the following activities: 11/28/2021  Hearing? N  Vision? Y  Difficulty concentrating or making decisions? Y  Comment memory issues  Walking or climbing stairs? Y  Comment ambulates with a walker  Dressing or bathing? Y  Comment Needs assist  Doing errands, shopping? Y  Comment needs Network engineer and eating ? Y  Using the Toilet? N  In the past six months, have you accidently leaked urine? Y  Do you have problems with loss of bowel control? N  Managing your Medications? Y  Comment daughter assit  Managing your Finances? Y  Comment daughter assist  Housekeeping or managing your Housekeeping? Y  Comment daughter assist  Some recent data might be hidden    Patient Care Team: Aaniyah Strohm, Donalee Citrin, NP as PCP - General (Family Medicine) Chalmers Guest, MD as Consulting Physician  (Ophthalmology)  Indicate any recent Medical Services you may have received from other than [facility] providers in the past year (date may be approximate).     Assessment:   This is a routine wellness examination for Caitlain.  Hearing/Vision screen Hearing Screening - Comments:: No Hearing Concerns. Patient does NOT wear hearing aids.  Vision Screening - Comments:: Some vision concerns. Patient wears prescription glasses. Patient last eye exam 2000.  Dietary issues and exercise activities discussed: Current Exercise Habits: The patient does not participate in regular exercise at present, Exercise limited by: orthopedic condition(s) (unsteady gait)   Goals Addressed   None    Depression Screen PHQ 2/9 Scores 11/28/2021 11/24/2020 12/09/2019 11/23/2019 11/18/2017 10/22/2016 06/18/2016  PHQ - 2 Score 0 0 0 0 1 0 0    Fall Risk Fall Risk  11/28/2021 08/30/2021 03/08/2021 03/01/2021 11/24/2020  Falls in the past year? 0 0 0 0 1  Number falls in past yr: 0 0 0 0 0  Comment - - - - -  Injury with Fall? 0 0 0 0 1  Risk for fall due to : No Fall Risks No Fall Risks - - -  Follow up Falls evaluation completed Falls evaluation completed - - -    FALL RISK PREVENTION PERTAINING TO THE HOME:  Any stairs in or around the home? No  If so, are there any without handrails? No  Home free of loose throw rugs in walkways, pet beds, electrical cords, etc? No  Adequate lighting in your home to reduce  risk of falls? Yes   ASSISTIVE DEVICES UTILIZED TO PREVENT FALLS:  Life alert? No  Use of a cane, walker or w/c? Yes  Grab bars in the bathroom? Yes  Shower chair or bench in shower? Yes  Elevated toilet seat or a handicapped toilet? Yes   TIMED UP AND GO:  Was the test performed? No .  Length of time to ambulate 10 feet: N/A sec.   Gait slow and steady with assistive device  Cognitive Function: MMSE - Mini Mental State Exam 11/18/2017 06/18/2016 04/21/2015  Orientation to time 2 3 2   Orientation to Place 5  5 5   Registration 3 3 3   Attention/ Calculation 5 5 5   Recall 1 3 1   Language- name 2 objects 2 2 2   Language- repeat 1 1 1   Language- follow 3 step command 3 3 3   Language- read & follow direction 1 1 1   Write a sentence 1 1 1   Copy design 1 1 1   Total score 25 28 25      6CIT Screen 11/28/2021 11/24/2020 11/23/2019  What Year? 4 points 0 points 4 points  What month? 3 points 0 points 3 points  What time? 0 points 0 points 0 points  Count back from 20 0 points 0 points 0 points  Months in reverse 0 points 0 points 0 points  Repeat phrase 10 points 10 points 0 points  Total Score 17 10 7     Immunizations Immunization History  Administered Date(s) Administered   Fluad Quad(high Dose 65+) 08/30/2021   Influenza, High Dose Seasonal PF 10/27/2018, 07/18/2020   Influenza,inj,Quad PF,6+ Mos 05/27/2015, 06/18/2016   Influenza-Unspecified 05/18/2017   Pneumococcal Conjugate-13 10/11/2014   Pneumococcal Polysaccharide-23 01/20/2016   Pneumococcal-Unspecified 09/17/2020   Tdap 09/17/2010   Zoster Recombinat (Shingrix) 09/17/2020   Zoster, Live 07/22/2014    TDAP status: Due, Education has been provided regarding the importance of this vaccine. Advised may receive this vaccine at local pharmacy or Health Dept. Aware to provide a copy of the vaccination record if obtained from local pharmacy or Health Dept. Verbalized acceptance and understanding.  Flu Vaccine status: Up to date  Pneumococcal vaccine status: Up to date  Covid-19 vaccine status: Declined, Education has been provided regarding the importance of this vaccine but patient still declined. Advised may receive this vaccine at local pharmacy or Health Dept.or vaccine clinic. Aware to provide a copy of the vaccination record if obtained from local pharmacy or Health Dept. Verbalized acceptance and understanding.  Qualifies for Shingles Vaccine? Yes   Zostavax completed No   Shingrix Completed?: No.    Education has been provided  regarding the importance of this vaccine. Patient has been advised to call insurance company to determine out of pocket expense if they have not yet received this vaccine. Advised may also receive vaccine at local pharmacy or Health Dept. Verbalized acceptance and understanding.  Screening Tests Health Maintenance  Topic Date Due   COVID-19 Vaccine (1) Never done   TETANUS/TDAP  09/17/2020   Zoster Vaccines- Shingrix (2 of 2) 11/12/2020   Pneumonia Vaccine 31+ Years old  Completed   INFLUENZA VACCINE  Completed   DEXA SCAN  Completed   HPV VACCINES  Aged Out    Health Maintenance  Health Maintenance Due  Topic Date Due   COVID-19 Vaccine (1) Never done   TETANUS/TDAP  09/17/2020   Zoster Vaccines- Shingrix (2 of 2) 11/12/2020    Colorectal cancer screening: No longer required.   Mammogram status:  No longer required due to advance age.  Bone Density status: Ordered 04/04/2022. Pt provided with contact info and advised to call to schedule appt.  Lung Cancer Screening: (Low Dose CT Chest recommended if Age 34-80 years, 30 pack-year currently smoking OR have quit w/in 15years.) does not qualify.   Lung Cancer Screening Referral: No  Additional Screening:  Hepatitis C Screening: does not qualify; Completed No  Vision Screening: Recommended annual ophthalmology exams for early detection of glaucoma and other disorders of the eye. Is the patient up to date with their annual eye exam?  Yes  Who is the provider or what is the name of the office in which the patient attends annual eye exams? Dr.Oman  If pt is not established with a provider, would they like to be referred to a provider to establish care? No .   Dental Screening: Recommended annual dental exams for proper oral hygiene  Community Resource Referral / Chronic Care Management: CRR required this visit?  No   CCM required this visit?  No      Plan:     I have personally reviewed and noted the following in the  patients chart:   Medical and social history Use of alcohol, tobacco or illicit drugs  Current medications and supplements including opioid prescriptions.  Functional ability and status Nutritional status Physical activity Advanced directives List of other physicians Hospitalizations, surgeries, and ER visits in previous 12 months Vitals Screenings to include cognitive, depression, and falls Referrals and appointments  In addition, I have reviewed and discussed with patient certain preventive protocols, quality metrics, and best practice recommendations. A written personalized care plan for preventive services as well as general preventive health recommendations were provided to patient.     Caesar Bookman, NP   11/28/2021   Nurse Notes: Advised to get her Tdap ,and Zoster vaccine at the Pharmacy

## 2021-12-05 DIAGNOSIS — H3540 Unspecified peripheral retinal degeneration: Secondary | ICD-10-CM | POA: Diagnosis not present

## 2021-12-11 DIAGNOSIS — M19012 Primary osteoarthritis, left shoulder: Secondary | ICD-10-CM | POA: Diagnosis not present

## 2022-01-03 ENCOUNTER — Encounter: Payer: Self-pay | Admitting: Podiatry

## 2022-01-03 ENCOUNTER — Ambulatory Visit: Payer: Medicare Other | Admitting: Podiatry

## 2022-01-03 DIAGNOSIS — B351 Tinea unguium: Secondary | ICD-10-CM | POA: Diagnosis not present

## 2022-01-03 DIAGNOSIS — M79675 Pain in left toe(s): Secondary | ICD-10-CM

## 2022-01-03 DIAGNOSIS — M79674 Pain in right toe(s): Secondary | ICD-10-CM | POA: Diagnosis not present

## 2022-01-10 DIAGNOSIS — H25811 Combined forms of age-related cataract, right eye: Secondary | ICD-10-CM | POA: Diagnosis not present

## 2022-01-13 NOTE — Progress Notes (Signed)
?  Subjective:  ?Patient ID: Amy Andrade, female    DOB: Jan 30, 1936,  MRN: [id number] ? ?Amy Andrade presents to clinic today for painful elongated mycotic toenails 1-5 bilaterally which are tender when wearing enclosed shoe gear. Pain is relieved with periodic professional debridement. ? ?Patient is accompanied by her son on today's visit. Patient c/o painful left 4th toe pain. ? ?New problem(s): None.  ? ?PCP is Ngetich, Nelda Bucks, NP , and last visit was August 30, 2021. ? ?No Known Allergies ? ?Review of Systems: Negative except as noted in the HPI. ? ?Objective: No changes noted in today's physical examination. ? ? ?General: Amy Andrade is a pleasant 86 y.o. African American female, in NAD. AAO x 3.  ? ?Vascular:  ?Capillary refill time to digits immediate b/l. Palpable pedal pulses b/l LE. Pedal hair sparse. Lower extremity skin temperature gradient within normal limits. No pain with calf compression b/l. Nonpitting edema noted b/l lower extremities. ? ?Dermatological:  ?Pedal skin with normal turgor, texture and tone b/l lower extremities No open wounds b/l lower extremities No interdigital macerations b/l lower extremities Toenails 1-5 b/l elongated, discolored, dystrophic, thickened, crumbly with subungual debris and tenderness to dorsal palpation. Nailplate impinging on left 4th toe. No breaks in skin, no erythema, no edema, no drainage, no fluctuance. ? ?Musculoskeletal:  ?Normal muscle strength 5/5 to all lower extremity muscle groups bilaterally. No pain crepitus or joint limitation noted with ROM b/l. Hammertoe(s) noted to the 2-5 bilaterally. ? ?Neurological:  ?Protective sensation intact 5/5 intact bilaterally with 10g monofilament b/l. Vibratory sensation intact b/l. ? ? ?  Latest Ref Rng & Units 03/08/2021  ?  9:10 AM  ?Hemoglobin A1C  ?Hemoglobin-A1c <5.7 % of total Hgb 5.3    ? ?Assessment/Plan: ?1. Pain due to onychomycosis of toenails of both feet   ?  ?-Patient was evaluated and  treated. All patient's and/or POA's questions/concerns answered on today's visit. ?-Patient to continue soft, supportive shoe gear daily. ?-Mycotic toenails 1-5 bilaterally were debrided in length and girth with sterile nail nippers and dremel without incident. ?-Patient/POA to call should there be question/concern in the interim.  ? ?Return in about 3 months (around 04/04/2022). ? ?Marzetta Board, DPM  ?

## 2022-01-23 ENCOUNTER — Telehealth: Payer: Self-pay | Admitting: *Deleted

## 2022-01-23 NOTE — Telephone Encounter (Signed)
Son, Amy Andrade, dropped of FMLA Paperwork to be filled out for his Employer ?Son paid $25 up front to have form filled out.  ?Placed form in Dinah's folder to review and sign.  ?To call son [phone] once completed.  ?

## 2022-01-24 NOTE — Telephone Encounter (Signed)
Paperwork completed and signed.  ?Son Notified and agreed and will pick up. Left up front in drawer.  ?Copy sent to scanning.  ?

## 2022-01-25 ENCOUNTER — Other Ambulatory Visit: Payer: Self-pay | Admitting: Nurse Practitioner

## 2022-01-25 DIAGNOSIS — G8929 Other chronic pain: Secondary | ICD-10-CM

## 2022-02-27 ENCOUNTER — Encounter: Payer: Self-pay | Admitting: Family

## 2022-02-28 ENCOUNTER — Ambulatory Visit (INDEPENDENT_AMBULATORY_CARE_PROVIDER_SITE_OTHER): Payer: Medicare Other | Admitting: Family

## 2022-02-28 VITALS — BP 138/80 | HR 67 | Temp 97.4°F | Resp 20 | Ht 70.0 in | Wt 202.0 lb

## 2022-02-28 DIAGNOSIS — J4520 Mild intermittent asthma, uncomplicated: Secondary | ICD-10-CM | POA: Diagnosis not present

## 2022-02-28 DIAGNOSIS — B372 Candidiasis of skin and nail: Secondary | ICD-10-CM

## 2022-02-28 DIAGNOSIS — I10 Essential (primary) hypertension: Secondary | ICD-10-CM

## 2022-02-28 DIAGNOSIS — E785 Hyperlipidemia, unspecified: Secondary | ICD-10-CM | POA: Diagnosis not present

## 2022-02-28 DIAGNOSIS — F0280 Dementia in other diseases classified elsewhere without behavioral disturbance: Secondary | ICD-10-CM

## 2022-02-28 DIAGNOSIS — G301 Alzheimer's disease with late onset: Secondary | ICD-10-CM

## 2022-02-28 MED ORDER — NYSTATIN 100000 UNIT/GM EX CREA: 1.0000 "application " | TOPICAL_CREAM | Freq: Two times a day (BID) | CUTANEOUS | 0 refills | Status: DC

## 2022-02-28 MED ORDER — NYSTATIN 100000 UNIT/GM EX CREA: 1.0000 "application " | TOPICAL_CREAM | Freq: Two times a day (BID) | CUTANEOUS | 3 refills | Status: DC

## 2022-02-28 NOTE — Progress Notes (Signed)
Provider: Richarda Blade FNP-C   Sharion Grieves, Donalee Citrin, NP  Patient Care Team: Donnah Levert, Donalee Citrin, NP as PCP - General (Family Medicine) Chalmers Guest, MD as Consulting Physician (Ophthalmology)  Extended Emergency Contact Information Primary Emergency Contact: Guandique,[name] Address: [address] [place], [place] [address] [place] of [place] Home Phone: [phone] Relation: Other Secondary Emergency Contact: Antony,[name] Address: [address]          [place], [place] [address] [place] of [place] Home Phone: [phone] Relation: Son  Code Status:  Full Code  Goals of care: Advanced Directive information    02/27/2022    4:07 PM  Advanced Directives  Does Patient Have a Medical Advance Directive? Yes  Type of Advance Directive Healthcare Power of Attorney  Does patient want to make changes to medical advance directive? No - Patient declined  Copy of Healthcare Power of Attorney in Chart? Yes - validated most recent copy scanned in chart (See row information)     Chief Complaint  Patient presents with   Medical Management of Chronic Issues    Patient is here for a 6 month F/U, Discuss need for second Shingrix, and Tdap. [facility] verified    HPI:  Pt is a 86 y.o. female seen today for 6 months for medical management of chronic diseases.  Has some medical history of essential hypertension, asthma, Alzheimer's dementia, hyperlipidemia among others Shoulder pain - has upcoming appointment with Orthopedic for cortisol injection.continues to require Norco for pain takes at bedtime.   Health maintenance: Due for second Shingrix vaccine and Tdap aware to get vaccine at the pharmacy   Past Medical History:  Diagnosis Date   Alzheimer disease (HCC)    Asthma    Hyperlipidemia    Hypertension    Pulmonary embolism (HCC) 2004   was short of breath--was on coumadin for 6 mos to a year   Past Surgical History:  Procedure Laterality Date    ABDOMINAL HYSTERECTOMY     BUNIONECTOMY     REPLACEMENT TOTAL KNEE  1980s, 2003   x2    No Known Allergies  Allergies as of 02/28/2022   No Known Allergies      Medication List        Accurate as of February 28, 2022 10:22 PM. If you have any questions, ask your nurse or doctor.          STOP taking these medications    acetaminophen 500 MG tablet Commonly known as: TYLENOL   bisacodyl 10 MG suppository Commonly known as: DULCOLAX   bupivacaine (PF) 0.25 % Soln injection Commonly known as: MARCAINE   famotidine 20 MG tablet Commonly known as: PEPCID   metoprolol tartrate 25 MG tablet Commonly known as: LOPRESSOR   triamcinolone acetonide 40 MG/ML Susp Commonly known as: TRIESENCE       TAKE these medications    amLODipine 10 MG tablet Commonly known as: NORVASC TAKE 1 TABLET(10 MG) BY MOUTH DAILY What changed: Another medication with the same name was removed. Continue taking this medication, and follow the directions you see here.   aspirin 81 MG tablet Take 81 mg by mouth daily. What changed: Another medication with the same name was removed. Continue taking this medication, and follow the directions you see here.   HYDROcodone-acetaminophen 5-325 MG tablet Commonly known as: NORCO/VICODIN Take 1 tablet by mouth every 12 (twelve) hours as  needed for severe pain.   irbesartan 300 MG tablet Commonly known as: AVAPRO TAKE 1 TABLET(300 MG) BY MOUTH DAILY   methocarbamol 500 MG tablet Commonly known as: ROBAXIN TAKE 1 TABLET(500 MG) BY MOUTH DAILY   nitroGLYCERIN 0.4 MG SL tablet Commonly known as: NITROSTAT Place 1 tablet (0.4 mg total) under the tongue every 5 (five) minutes x 3 doses as needed for chest pain.   nystatin cream Commonly known as: MYCOSTATIN Apply 1 application  topically 2 (two) times daily.   pravastatin 40 MG tablet Commonly known as: PRAVACHOL TAKE 1 TABLET(40 MG) BY MOUTH DAILY        Review of Systems   Constitutional:  Negative for appetite change, chills, fatigue, fever and unexpected weight change.  HENT:  Negative for congestion, dental problem, ear discharge, ear pain, facial swelling, hearing loss, nosebleeds, postnasal drip, rhinorrhea, sinus pressure, sinus pain, sneezing, sore throat, tinnitus and trouble swallowing.   Eyes:  Negative for pain, discharge, redness, itching and visual disturbance.  Respiratory:  Negative for cough, chest tightness, shortness of breath and wheezing.   Cardiovascular:  Negative for chest pain, palpitations and leg swelling.  Gastrointestinal:  Negative for abdominal distention, abdominal pain, constipation, diarrhea, nausea and vomiting.  Endocrine: Negative for cold intolerance, heat intolerance, polydipsia, polyphagia and polyuria.  Genitourinary:  Negative for difficulty urinating, dysuria, flank pain, frequency and urgency.  Musculoskeletal:  Positive for arthralgias and gait problem. Negative for back pain, joint swelling, myalgias, neck pain and neck stiffness.       Left shoulder pain   Skin:  Negative for color change, pallor, rash and wound.       Breast fold redness has improved but not resolved   Neurological:  Negative for dizziness, syncope, speech difficulty, weakness, light-headedness, numbness and headaches.  Hematological:  Does not bruise/bleed easily.  Psychiatric/Behavioral:  Negative for agitation, behavioral problems, confusion, hallucinations, sleep disturbance and suicidal ideas. The patient is not nervous/anxious.     Immunization History  Administered Date(s) Administered   Fluad Quad(high Dose 65+) 08/30/2021   Influenza, High Dose Seasonal PF 10/27/2018, 07/18/2020   Influenza,inj,Quad PF,6+ Mos 05/27/2015, 06/18/2016   Influenza-Unspecified 05/18/2017   Pneumococcal Conjugate-13 10/11/2014   Pneumococcal Polysaccharide-23 01/20/2016   Pneumococcal-Unspecified 09/17/2020   Tdap 09/17/2010   Zoster Recombinat (Shingrix)  09/17/2020   Zoster, Live 07/22/2014   Pertinent  Health Maintenance Due  Topic Date Due   INFLUENZA VACCINE  04/17/2022   DEXA SCAN  Completed      11/24/2020    1:19 PM 03/01/2021    1:03 PM 03/08/2021    8:23 AM 08/30/2021    2:47 PM 11/28/2021    1:40 PM  Fall Risk  Falls in the past year? 1 0 0 0 0  Was there an injury with Fall? 1 0 0 0 0  Fall Risk Category Calculator 2 0 0 0 0  Fall Risk Category Moderate Low Low Low Low  Patient Fall Risk Level Moderate fall risk Low fall risk Low fall risk Low fall risk Low fall risk  Patient at Risk for Falls Due to    No Fall Risks No Fall Risks  Fall risk Follow up    Falls evaluation completed Falls evaluation completed   Functional Status Survey:    Vitals:   02/28/22 1540  BP: 138/80  Pulse: 67  Resp: 20  Temp: (!) 97.4 F (36.3 C)  SpO2: 99%  Weight: 202 lb (91.6 kg)  Height: $Remove'5\' 10"'ynJJFUA$  (1.778 m)  Body mass index is 28.98 kg/m. Physical Exam Vitals reviewed.  Constitutional:      General: She is not in acute distress.    Appearance: Normal appearance. She is overweight. She is not ill-appearing or diaphoretic.  HENT:     Head: Normocephalic.     Right Ear: Tympanic membrane, ear canal and external ear normal. There is no impacted cerumen.     Left Ear: Tympanic membrane, ear canal and external ear normal. There is no impacted cerumen.     Nose: Nose normal. No congestion or rhinorrhea.     Mouth/Throat:     Mouth: Mucous membranes are moist.     Pharynx: Oropharynx is clear. No oropharyngeal exudate or posterior oropharyngeal erythema.  Eyes:     General: No scleral icterus.       Right eye: No discharge.        Left eye: No discharge.     Extraocular Movements: Extraocular movements intact.     Conjunctiva/sclera: Conjunctivae normal.     Pupils: Pupils are equal, round, and reactive to light.  Neck:     Vascular: No carotid bruit.  Cardiovascular:     Rate and Rhythm: Normal rate and regular rhythm.      Pulses: Normal pulses.     Heart sounds: Normal heart sounds. No murmur heard.    No friction rub. No gallop.  Pulmonary:     Effort: Pulmonary effort is normal. No respiratory distress.     Breath sounds: Normal breath sounds. No wheezing, rhonchi or rales.  Chest:     Chest wall: No tenderness.  Abdominal:     General: Bowel sounds are normal. There is no distension.     Palpations: Abdomen is soft. There is no mass.     Tenderness: There is no abdominal tenderness. There is no right CVA tenderness, left CVA tenderness, guarding or rebound.  Musculoskeletal:        General: No swelling or tenderness. Normal range of motion.     Cervical back: Normal range of motion. No rigidity or tenderness.     Right lower leg: No edema.     Left lower leg: No edema.  Lymphadenopathy:     Cervical: No cervical adenopathy.  Skin:    General: Skin is warm and dry.     Coloration: Skin is not pale.     Findings: No bruising, lesion or rash.     Comments: Bilateral breast fold erythema has improved but not resolved  Neurological:     Mental Status: She is alert and oriented to person, place, and time.     Cranial Nerves: No cranial nerve deficit.     Sensory: No sensory deficit.     Motor: No weakness.     Coordination: Coordination normal.     Gait: Gait normal.  Psychiatric:        Mood and Affect: Mood normal.        Speech: Speech normal.        Behavior: Behavior normal.        Thought Content: Thought content normal.        Judgment: Judgment normal.     Labs reviewed: Recent Labs    03/08/21 0910 09/04/21 0843 09/19/21 0937  NA 135 129* 135  K 4.7 4.5 4.9  CL 99 95* 97*  CO2 $Re'23 21 29  'KpM$ GLUCOSE 100* 99 119  BUN $Re'16 10 13  'fbN$ CREATININE 0.75 0.80 0.86  CALCIUM 9.4 9.5 10.1  Recent Labs    03/08/21 0910 09/04/21 0843  AST 14 15  ALT 9 10  BILITOT 0.7 0.6  PROT 6.3 6.9   Recent Labs    03/08/21 0910 09/04/21 0843  WBC 4.7 5.1  NEUTROABS 2,637 2,642  HGB 13.8 14.6   HCT 40.0 41.6  MCV 101.0* 99.3  PLT 212 223   Lab Results  Component Value Date   TSH 2.10 09/04/2021   Lab Results  Component Value Date   HGBA1C 5.3 03/08/2021   Lab Results  Component Value Date   CHOL 193 09/04/2021   HDL 114 09/04/2021   LDLCALC 66 09/04/2021   TRIG 57 09/04/2021   CHOLHDL 1.7 09/04/2021    Significant Diagnostic Results in last 30 days:  No results found.  Assessment/Plan 1. Candidiasis of skin Bilateral breast fold erythema has improved but not resolved - nystatin cream (MYCOSTATIN); Apply 1 application  topically 2 (two) times daily.  Dispense: 30 g; Refill: 3  2. Essential hypertension Blood pressure well controlled Continue on amlodipine and irbesartan - TSH; Future - COMPLETE METABOLIC PANEL WITH GFR; Future - CBC with Differential/Platelet; Future  3. Hyperlipidemia, unspecified hyperlipidemia type LDL at goal -Continue on pravastatin - Lipid panel; Future  4. Mild intermittent asthma, unspecified whether complicated Symptoms controlled Has not required any inhaler  5. Late onset Alzheimer's disease without behavioral disturbance (Angelina) No new behavioral issues reported  Family/ staff Communication: Reviewed plan of care with patient verbalized understanding  Labs/tests ordered: - CBC with Differential/Platelet - CMP with eGFR([facility]) - TSH - Lipid panel  Next Appointment : Return in about 6 months (around 08/30/2022) for fasting labs in one week.    Sandrea Hughs, NP

## 2022-02-28 NOTE — Patient Instructions (Signed)

## 2022-03-02 ENCOUNTER — Ambulatory Visit: Payer: Medicare Other | Admitting: Family

## 2022-03-02 DIAGNOSIS — H25811 Combined forms of age-related cataract, right eye: Secondary | ICD-10-CM | POA: Diagnosis not present

## 2022-03-02 DIAGNOSIS — H2511 Age-related nuclear cataract, right eye: Secondary | ICD-10-CM | POA: Diagnosis not present

## 2022-03-06 ENCOUNTER — Other Ambulatory Visit: Payer: Self-pay | Admitting: Family

## 2022-03-06 DIAGNOSIS — E785 Hyperlipidemia, unspecified: Secondary | ICD-10-CM

## 2022-03-06 DIAGNOSIS — I10 Essential (primary) hypertension: Secondary | ICD-10-CM

## 2022-03-07 ENCOUNTER — Other Ambulatory Visit: Payer: Medicare Other

## 2022-03-07 DIAGNOSIS — I10 Essential (primary) hypertension: Secondary | ICD-10-CM | POA: Diagnosis not present

## 2022-03-07 DIAGNOSIS — E785 Hyperlipidemia, unspecified: Secondary | ICD-10-CM | POA: Diagnosis not present

## 2022-03-08 LAB — CBC WITH DIFFERENTIAL/PLATELET
Absolute Monocytes: 450 cells/uL (ref 200–950)
Basophils Absolute: 20 cells/uL (ref 0–200)
Basophils Relative: 0.4 %
Eosinophils Absolute: 30 cells/uL (ref 15–500)
Eosinophils Relative: 0.6 %
HCT: 40.8 % (ref 35.0–45.0)
Hemoglobin: 14 g/dL (ref 11.7–15.5)
Lymphs Abs: 1755 cells/uL (ref 850–3900)
MCH: 32.5 pg (ref 27.0–33.0)
MCHC: 34.3 g/dL (ref 32.0–36.0)
MCV: 94.7 fL (ref 80.0–100.0)
MPV: 10.4 fL (ref 7.5–12.5)
Monocytes Relative: 9 %
Neutro Abs: 2745 cells/uL (ref 1500–7800)
Neutrophils Relative %: 54.9 %
Platelets: 248 10*3/uL (ref 140–400)
RBC: 4.31 10*6/uL (ref 3.80–5.10)
RDW: 11.9 % (ref 11.0–15.0)
Total Lymphocyte: 35.1 %
WBC: 5 10*3/uL (ref 3.8–10.8)

## 2022-03-08 LAB — LIPID PANEL
Cholesterol: 199 mg/dL (ref <200)
HDL: 102 mg/dL (ref >=50)
LDL Cholesterol (Calc): 84 mg/dL (calc)
Non-HDL Cholesterol (Calc): 97 mg/dL (calc) (ref <130)
Total CHOL/HDL Ratio: 2 (calc) (ref <5.0)
Triglycerides: 52 mg/dL (ref <150)

## 2022-03-08 LAB — COMPLETE METABOLIC PANEL WITH GFR
AG Ratio: 1.5 (calc) (ref 1.0–2.5)
ALT: 10 U/L (ref 6–29)
AST: 15 U/L (ref 10–35)
Albumin: 4 g/dL (ref 3.6–5.1)
Alkaline phosphatase (APISO): 75 U/L (ref 37–153)
BUN: 10 mg/dL (ref 7–25)
CO2: 27 mmol/L (ref 20–32)
Calcium: 10 mg/dL (ref 8.6–10.4)
Chloride: 97 mmol/L — ABNORMAL LOW (ref 98–110)
Creat: 0.78 mg/dL (ref 0.60–0.95)
Globulin: 2.6 g/dL (calc) (ref 1.9–3.7)
Glucose, Bld: 97 mg/dL (ref 65–99)
Potassium: 5.2 mmol/L (ref 3.5–5.3)
Sodium: 133 mmol/L — ABNORMAL LOW (ref 135–146)
Total Bilirubin: 1 mg/dL (ref 0.2–1.2)
Total Protein: 6.6 g/dL (ref 6.1–8.1)
eGFR: 75 mL/min/{1.73_m2} (ref >=60)

## 2022-03-08 LAB — TSH: TSH: 1.35 mIU/L (ref 0.40–4.50)

## 2022-03-12 DIAGNOSIS — M19012 Primary osteoarthritis, left shoulder: Secondary | ICD-10-CM | POA: Diagnosis not present

## 2022-03-16 DIAGNOSIS — H25812 Combined forms of age-related cataract, left eye: Secondary | ICD-10-CM | POA: Diagnosis not present

## 2022-03-16 DIAGNOSIS — H2512 Age-related nuclear cataract, left eye: Secondary | ICD-10-CM | POA: Diagnosis not present

## 2022-04-04 ENCOUNTER — Ambulatory Visit
Admission: RE | Admit: 2022-04-04 | Discharge: 2022-04-04 | Disposition: A | Discharge status: Home / Self Care | Payer: Medicare Other | Source: Ambulatory Visit | Attending: Family | Admitting: Family

## 2022-04-04 ENCOUNTER — Other Ambulatory Visit: Payer: Self-pay

## 2022-04-04 DIAGNOSIS — Z780 Asymptomatic menopausal state: Secondary | ICD-10-CM | POA: Diagnosis not present

## 2022-04-04 DIAGNOSIS — M8589 Other specified disorders of bone density and structure, multiple sites: Secondary | ICD-10-CM | POA: Diagnosis not present

## 2022-04-09 ENCOUNTER — Ambulatory Visit (INDEPENDENT_AMBULATORY_CARE_PROVIDER_SITE_OTHER): Payer: Medicare Other | Admitting: Podiatry

## 2022-04-09 ENCOUNTER — Encounter: Payer: Self-pay | Admitting: Podiatry

## 2022-04-09 DIAGNOSIS — M79675 Pain in left toe(s): Secondary | ICD-10-CM | POA: Diagnosis not present

## 2022-04-09 DIAGNOSIS — B351 Tinea unguium: Secondary | ICD-10-CM

## 2022-04-09 DIAGNOSIS — M79674 Pain in right toe(s): Secondary | ICD-10-CM | POA: Diagnosis not present

## 2022-04-09 NOTE — Progress Notes (Signed)
  Subjective:  Patient ID: Amy Andrade, female    DOB: Sep 12, 1936,  MRN: [id number]  Amy Andrade presents to clinic today for painful thick toenails that are difficult to trim. Pain interferes with ambulation. Aggravating factors include wearing enclosed shoe gear. Pain is relieved with periodic professional debridement.  Patient is accompanied by her son on today's visit.  New problem(s): None.   PCP is Ngetich, Dinah C, NP , and last visit was  February 28, 2022  No Known Allergies  Review of Systems: Negative except as noted in the HPI.  Objective: No changes noted in today's physical examination. General: Amy Andrade is a pleasant 86 y.o. African American female, in NAD. AAO x 3.   Vascular:  Capillary refill time to digits immediate b/l. Palpable pedal pulses b/l LE. Pedal hair sparse. Lower extremity skin temperature gradient within normal limits. No pain with calf compression b/l. Nonpitting edema noted b/l lower extremities.  Dermatological:  Pedal skin with normal turgor, texture and tone b/l lower extremities No open wounds b/l lower extremities No interdigital macerations b/l lower extremities Toenails 1-5 b/l elongated, discolored, dystrophic, thickened, crumbly with subungual debris and tenderness to dorsal palpation. Nailplate impinging on left 4th toe. No breaks in skin, no erythema, no edema, no drainage, no fluctuance.  Porokeratotic lesion(s) distal tip of right 3rd toe. No erythema, no edema, no drainage, no fluctuance.  Musculoskeletal:  Normal muscle strength 5/5 to all lower extremity muscle groups bilaterally. No pain crepitus or joint limitation noted with ROM b/l. Hammertoe(s) noted to the 2-5 bilaterally.  Neurological:  Protective sensation intact 5/5 intact bilaterally with 10g monofilament b/l. Vibratory sensation intact b/l.  Assessment/Plan: 1. Pain due to onychomycosis of toenails of both feet   2. Porokeratosis      -Examined  patient. -Patient to continue soft, supportive shoe gear daily. -Toenails 1-5 bilaterally were debrided in length and girth with sterile nail nippers and dremel. Pinpoint bleeding of left great toe addressed with Lumicain Hemostatic Solution, cleansed with alcohol. triple antibiotic ointment applied. No further treatment required by patient/caregiver. -As a courtesy, porokeratotic lesion(s) distal tip of right 3rd toe pared and enucleated without complication or incident. Total number pared=1. -Dispensed silicone toe cap. Apply to distal tip of right 3rd toe every morning. Remove every evening. -Patient/POA to call should there be question/concern in the interim.   Return in about 3 months (around 07/10/2022).  Freddie Breech, DPM

## 2022-04-25 ENCOUNTER — Other Ambulatory Visit: Payer: Self-pay | Admitting: Family

## 2022-04-25 DIAGNOSIS — I10 Essential (primary) hypertension: Secondary | ICD-10-CM

## 2022-05-14 ENCOUNTER — Other Ambulatory Visit: Payer: Self-pay

## 2022-05-14 DIAGNOSIS — G8929 Other chronic pain: Secondary | ICD-10-CM

## 2022-05-14 DIAGNOSIS — S72402S Unspecified fracture of lower end of left femur, sequela: Secondary | ICD-10-CM

## 2022-05-14 MED ORDER — HYDROCODONE-ACETAMINOPHEN 5-325 MG PO TABS: 1.0000 | ORAL_TABLET | Freq: Two times a day (BID) | ORAL | 0 refills | Status: DC | PRN

## 2022-05-14 NOTE — Telephone Encounter (Signed)
Patient's son called stating patient needs refill on Hydrocodone 5/325mg  tablet. Patient opioid contract not up to date. Last refill 08/30/2021  Medication pended and sent to Richarda Blade, NP

## 2022-05-25 ENCOUNTER — Other Ambulatory Visit: Payer: Self-pay | Admitting: Family

## 2022-05-25 DIAGNOSIS — E785 Hyperlipidemia, unspecified: Secondary | ICD-10-CM

## 2022-06-11 DIAGNOSIS — M19012 Primary osteoarthritis, left shoulder: Secondary | ICD-10-CM | POA: Diagnosis not present

## 2022-06-17 HISTORY — PX: CATARACT EXTRACTION: SUR2

## 2022-07-05 ENCOUNTER — Other Ambulatory Visit: Payer: Self-pay | Admitting: Family

## 2022-07-05 DIAGNOSIS — G8929 Other chronic pain: Secondary | ICD-10-CM

## 2022-07-05 NOTE — Telephone Encounter (Signed)
Patient has request refill on medication Methocarbamol 500mg . Patient medication last refilled 01/25/2022. Patient medication has warnings. Medication pend and sent to Sherrie Mustache, NP due to PCP Ngetich, Nelda Bucks, NP being out of office.

## 2022-07-18 ENCOUNTER — Encounter: Payer: Self-pay | Admitting: Podiatry

## 2022-07-18 ENCOUNTER — Ambulatory Visit (INDEPENDENT_AMBULATORY_CARE_PROVIDER_SITE_OTHER): Payer: Medicare Other | Admitting: Podiatry

## 2022-07-18 DIAGNOSIS — Q828 Other specified congenital malformations of skin: Secondary | ICD-10-CM | POA: Diagnosis not present

## 2022-07-18 DIAGNOSIS — M79674 Pain in right toe(s): Secondary | ICD-10-CM

## 2022-07-18 DIAGNOSIS — M79675 Pain in left toe(s): Secondary | ICD-10-CM | POA: Diagnosis not present

## 2022-07-18 DIAGNOSIS — B351 Tinea unguium: Secondary | ICD-10-CM

## 2022-07-18 NOTE — Progress Notes (Signed)
  Subjective:  Patient ID: Amy Andrade, female    DOB: Nov 30, 1935,  MRN: [id number]  Amy Andrade presents to clinic today for  Chief Complaint  Patient presents with   Nail Problem    Routine foot care PCP-Dinah PCP VST-05/2022   New problem(s): None.   PCP is Ngetich, Nelda Bucks, NP , and last visit was February 28, 2022.  No Known Allergies  Review of Systems: Negative except as noted in the HPI.  Objective: No changes noted in today's physical examination.  Amy Andrade is a pleasant 86 y.o. female in NAD. AAO x 3.  Vascular:  Capillary refill time to digits immediate b/l. Palpable pedal pulses b/l LE. Pedal hair sparse. Lower extremity skin temperature gradient within normal limits. No pain with calf compression b/l. Nonpitting edema noted b/l lower extremities.  Dermatological:  Pedal skin with normal turgor, texture and tone b/l lower extremities No open wounds b/l lower extremities No interdigital macerations b/l lower extremities Toenails 1-5 b/l elongated, discolored, dystrophic, thickened, crumbly with subungual debris and tenderness to dorsal palpation. Nailplate impinging on left 4th toe. No breaks in skin, no erythema, no edema, no drainage, no fluctuance.  Porokeratotic lesion(s) distal tip of right 3rd toe with tenderness to palpation. No erythema, no edema, no drainage, no fluctuance.  Musculoskeletal:  Normal muscle strength 5/5 to all lower extremity muscle groups bilaterally. No pain crepitus or joint limitation noted with ROM b/l. Hammertoe(s) noted to the 2-5 bilaterally.  Neurological:  Protective sensation intact 5/5 intact bilaterally with 10g monofilament b/l. Vibratory sensation intact b/l. Assessment/Plan: 1. Pain due to onychomycosis of toenails of both feet   2. Porokeratosis   3. Pain in left toe(s)     No orders of the defined types were placed in this encounter.   -Patient's family member present. All questions/concerns addressed on  today's visit. -Examined patient. -Continue supportive shoe gear daily. -Toenails 1-5 b/l were debrided in length and girth with sterile nail nippers and dremel without iatrogenic bleeding.  -Porokeratotic lesion(s) distal tip of left 3rd toe pared and enucleated with sterile currette without incident. Total number of lesions debrided=1. -Patient/POA to call should there be question/concern in the interim.   Return in about 3 months (around 10/18/2022).  Marzetta Board, DPM

## 2022-08-30 ENCOUNTER — Encounter: Payer: Medicare Other | Admitting: Family

## 2022-09-03 ENCOUNTER — Encounter: Payer: Self-pay | Admitting: Family

## 2022-09-03 ENCOUNTER — Ambulatory Visit (INDEPENDENT_AMBULATORY_CARE_PROVIDER_SITE_OTHER): Payer: Medicare Other | Admitting: Family

## 2022-09-03 VITALS — BP 138/68 | HR 60 | Temp 97.4°F | Resp 16 | Ht 70.0 in | Wt 193.4 lb

## 2022-09-03 DIAGNOSIS — H6122 Impacted cerumen, left ear: Secondary | ICD-10-CM | POA: Diagnosis not present

## 2022-09-03 DIAGNOSIS — J4520 Mild intermittent asthma, uncomplicated: Secondary | ICD-10-CM

## 2022-09-03 DIAGNOSIS — E785 Hyperlipidemia, unspecified: Secondary | ICD-10-CM

## 2022-09-03 DIAGNOSIS — G8929 Other chronic pain: Secondary | ICD-10-CM | POA: Diagnosis not present

## 2022-09-03 DIAGNOSIS — M25512 Pain in left shoulder: Secondary | ICD-10-CM

## 2022-09-03 DIAGNOSIS — I10 Essential (primary) hypertension: Secondary | ICD-10-CM | POA: Diagnosis not present

## 2022-09-03 DIAGNOSIS — Z23 Encounter for immunization: Secondary | ICD-10-CM

## 2022-09-03 MED ORDER — DEBROX 6.5 % OT SOLN: 5.0000 [drp] | Freq: Two times a day (BID) | OTIC | 0 refills | Status: AC

## 2022-09-03 NOTE — Progress Notes (Signed)
Provider: [name]   Daryel Kenneth, Nelda Bucks, NP  Patient Care Team: Caulin Begley, Nelda Bucks, NP as PCP - General (Family Medicine) Marylynn Pearson, MD as Consulting Physician (Ophthalmology)  Extended Emergency Contact Information Primary Emergency Contact: Babers,[name] Address: [address] [place], [place] [address] [name] of [place] Phone: [phone] Relation: Other Secondary Emergency Contact: Arrambide,[name] Address: [place], [place] [address] [name] of [place] Phone: [phone] Relation: Son  Code Status:  Full Code  Goals of care: Advanced Directive information    09/03/2022   11:24 AM  Advanced Directives  Does Patient Have a Medical Advance Directive? Yes  Type of Paramedic of [place];Living will  Does patient want to make changes to medical advance directive? No - Patient declined  Copy of [place] in Chart? Yes - validated most recent copy scanned in chart (See row information)     Chief Complaint  Patient presents with   Medical Management of Chronic Issues    6 month follow up.    Immunizations    Discuss the need for Covid vaccine, Dtap vaccine, Shingrix vaccine, and Influenza vaccine.     HPI:  Pt is a 86 y.o. female seen today for 6 months follow up for medical management of chronic diseases. She is here with son who provides additional information. States has been doing well. Has had 8.6 lbs weight loss since last visit.son states changed diet to cut down on carbohydrates.Eating three meals and snacks on nuts and fruits. Does exercise by walking from the living room to the bathroom on a long hallway.Family plans to restart her working on resistant bands. Just limited by left chronic shoulder pain.follows up with Orthopedic at [facility].Has upcoming appointment for cortisol injection.  No fall episode reported.  Past Medical History:   Diagnosis Date   Alzheimer disease ([place])    Asthma    Hyperlipidemia    Hypertension    Pulmonary embolism ([place]) 2004   was short of breath--was on coumadin for 6 mos to a year   Past Surgical History:  Procedure Laterality Date   ABDOMINAL HYSTERECTOMY     BUNIONECTOMY     REPLACEMENT TOTAL KNEE  1980s, 2003   x2    No Known Allergies  Allergies as of 09/03/2022   No Known Allergies      Medication List        Accurate as of September 03, 2022 11:34 AM. If you have any questions, ask your nurse or doctor.          STOP taking these medications    Prednisol Ace-Moxiflox-Bromfen 1-0.5-0.075 % Susp Stopped by: Sandrea Hughs, NP       TAKE these medications    amLODipine 10 MG tablet Commonly known as: NORVASC TAKE 1 TABLET(10 MG) BY MOUTH DAILY   aspirin 81 MG tablet Take 81 mg by mouth daily.   CALCIUM-VITAMIN D PO Take 1 tablet by mouth 2 (two) times daily.   HYDROcodone-acetaminophen 5-325 MG tablet Commonly known as: NORCO/VICODIN Take 1 tablet by mouth every 12 (twelve) hours as needed for severe pain.   irbesartan 300 MG tablet Commonly known as: AVAPRO TAKE 1 TABLET(300 MG) BY MOUTH DAILY   methocarbamol 500 MG tablet Commonly known as: ROBAXIN TAKE 1 TABLET(500 MG) BY  MOUTH DAILY   nitroGLYCERIN 0.4 MG SL tablet Commonly known as: NITROSTAT Place 1 tablet (0.4 mg total) under the tongue every 5 (five) minutes x 3 doses as needed for chest pain.   nystatin cream Commonly known as: MYCOSTATIN Apply 1 application  topically 2 (two) times daily.   pravastatin 40 MG tablet Commonly known as: PRAVACHOL TAKE 1 TABLET(40 MG) BY MOUTH DAILY        Review of Systems  Constitutional:  Negative for appetite change, chills, fatigue, fever and unexpected weight change.  HENT:  Negative for congestion, dental problem, ear discharge, ear pain, facial swelling, hearing loss, nosebleeds, postnasal drip, rhinorrhea, sinus pressure, sinus pain,  sneezing, sore throat, tinnitus and trouble swallowing.   Eyes:  Negative for pain, discharge, redness, itching and visual disturbance.  Respiratory:  Negative for cough, chest tightness, shortness of breath and wheezing.   Cardiovascular:  Negative for chest pain, palpitations and leg swelling.  Gastrointestinal:  Negative for abdominal distention, abdominal pain, blood in stool, constipation, diarrhea, nausea and vomiting.  Endocrine: Negative for cold intolerance, heat intolerance, polydipsia, polyphagia and polyuria.  Genitourinary:  Negative for difficulty urinating, dysuria, flank pain, frequency and urgency.  Musculoskeletal:  Positive for arthralgias and gait problem. Negative for back pain, joint swelling, myalgias, neck pain and neck stiffness.       Left shoulder chronic pain   Skin:  Negative for color change, pallor, rash and wound.  Neurological:  Negative for dizziness, syncope, speech difficulty, weakness, light-headedness, numbness and headaches.  Hematological:  Does not bruise/bleed easily.  Psychiatric/Behavioral:  Negative for agitation, behavioral problems, confusion, hallucinations, self-injury, sleep disturbance and suicidal ideas. The patient is not nervous/anxious.     Immunization History  Administered Date(s) Administered   Fluad Quad(high Dose 65+) 08/30/2021   Influenza, High Dose Seasonal PF 10/27/2018, 07/18/2020   Influenza,inj,Quad PF,6+ Mos 05/27/2015, 06/18/2016   Influenza-Unspecified 05/18/2017   Pneumococcal Conjugate-13 10/11/2014   Pneumococcal Polysaccharide-23 01/20/2016   Pneumococcal-Unspecified 09/17/2020   Tdap 09/17/2010   Zoster Recombinat (Shingrix) 09/17/2020   Zoster, Live 07/22/2014   Pertinent  Health Maintenance Due  Topic Date Due   INFLUENZA VACCINE  04/17/2022   DEXA SCAN  Completed      03/01/2021    1:03 PM 03/08/2021    8:23 AM 08/30/2021    2:47 PM 11/28/2021    1:40 PM 09/03/2022   11:24 AM  Fall Risk  Falls in the  past year? 0 0 0 0 0  Was there an injury with Fall? 0 0 0 0 0  Fall Risk Category Calculator 0 0 0 0 0  Fall Risk Category _0   Patient Fall Risk Level _1   Patient at Risk for Falls Due to   No Fall Risks No Fall Risks No Fall Risks  Fall risk Follow up   Falls evaluation completed Falls evaluation completed Falls evaluation completed   Functional Status Survey:    Vitals:   09/03/22 1120  BP: 138/68  Pulse: 60  Resp: 16  Temp: (!) 97.4 F (36.3 C)  SpO2: 92%  Weight: 193 lb 6.4 oz (87.7 kg)  Height: _2  (1.778 m)   Body mass index is 27.75 kg/m. Physical Exam Vitals reviewed.  Constitutional:      General: She is not in acute distress.    Appearance: Normal appearance. She is normal weight. She is not ill-appearing or  diaphoretic.  HENT:     Head: Normocephalic.     Right Ear: Tympanic membrane, ear canal and external ear normal. There is no impacted cerumen.     Left Ear: Tympanic membrane, ear canal and external ear normal. There is impacted cerumen.     Nose: Nose normal. No congestion or rhinorrhea.     Mouth/Throat:     Mouth: Mucous membranes are moist.     Pharynx: Oropharynx is clear. No oropharyngeal exudate or posterior oropharyngeal erythema.  Eyes:     General: No scleral icterus.       Right eye: No discharge.        Left eye: No discharge.     Extraocular Movements: Extraocular movements intact.     Conjunctiva/sclera: Conjunctivae normal.     Pupils: Pupils are equal, round, and reactive to light.  Neck:     Vascular: No carotid bruit.  Cardiovascular:     Rate and Rhythm: Normal rate and regular rhythm.     Pulses: Normal pulses.     Heart sounds: Normal heart sounds. No murmur heard.    No friction rub. No gallop.  Pulmonary:     Effort: Pulmonary effort is normal. No respiratory distress.     Breath sounds: Normal breath sounds. No wheezing, rhonchi or rales.   Chest:     Chest wall: No tenderness.  Abdominal:     General: Bowel sounds are normal. There is no distension.     Palpations: Abdomen is soft. There is no mass.     Tenderness: There is no abdominal tenderness. There is no right CVA tenderness, left CVA tenderness, guarding or rebound.  Musculoskeletal:        General: No swelling.     Right shoulder: Normal.     Left shoulder: Tenderness present. No swelling or effusion. Decreased range of motion. Normal strength. Normal pulse.     Cervical back: Normal range of motion. No rigidity or tenderness.     Right lower leg: No edema.     Left lower leg: No edema.  Lymphadenopathy:     Cervical: No cervical adenopathy.  Skin:    General: Skin is warm and dry.     Coloration: Skin is not pale.     Findings: No bruising, erythema, lesion or rash.  Neurological:     Mental Status: She is alert and oriented to person, place, and time. Mental status is at baseline.     Cranial Nerves: No cranial nerve deficit.     Sensory: No sensory deficit.     Motor: No weakness.     Coordination: Coordination normal.     Gait: Gait abnormal.  Psychiatric:        Mood and Affect: Mood normal.        Speech: Speech normal.        Behavior: Behavior normal.        Thought Content: Thought content normal.        Judgment: Judgment normal.    Labs reviewed: Recent Labs    09/04/21 0843 09/19/21 0937 03/07/22 0947  NA 129* 135 133*  K 4.5 4.9 5.2  CL 95* 97* 97*  CO2 _0 GLUCOSE 99 119 97  BUN _1 CREATININE 0.80 0.86 0.78  CALCIUM 9.5 10.1 10.0   Recent Labs    09/04/21 0843 03/07/22 0947  AST 15 15  ALT 10 10  BILITOT 0.6 1.0  PROT 6.9 6.6  Recent Labs    09/04/21 0843 03/07/22 0947  WBC 5.1 5.0  NEUTROABS 2,642 2,745  HGB 14.6 14.0  HCT 41.6 40.8  MCV 99.3 94.7  PLT 223 248   Lab Results  Component Value Date   TSH 1.35 03/07/2022   Lab Results  Component Value Date   HGBA1C 5.3 03/08/2021   Lab  Results  Component Value Date   CHOL 199 03/07/2022   HDL 102 03/07/2022   LDLCALC 84 03/07/2022   TRIG 52 03/07/2022   CHOLHDL 2.0 03/07/2022    Significant Diagnostic Results in last 30 days:  No results found.  Assessment/Plan 1. Essential hypertension Blood pressure well-controlled -Continue Avapro and amlodipine -Continue with dietary modification and exercise as tolerated -Continue on aspirin and pravastatin for cardiovascular event prevention - COMPLETE METABOLIC PANEL WITH GFR - CBC with Differential/Platelet  2. Hyperlipidemia, unspecified hyperlipidemia type Latest LDL on chart at goal -Son has changed her diet weight loss noted.Continue on dietary modification and exercise. - Lipid panel  3. Mild intermittent asthma, unspecified whether complicated Symptoms controlled  4. Chronic left shoulder pain Chronic -Follow-up with orthopedic as scheduled -Continue on Hydrocodone every 12 hrs PRN as needed for pain  5. Need for influenza vaccination Afebrile Flut shot administered by CMA no acute reaction reported.  - Flu Vaccine QUAD High Dose(Fluad)  6. Impacted cerumen of left ear Left TM not visualized due to cerumen impaction - Instill debrox 6.5 otic solution 5 drops into left ear twice daily x 4 days then follow up for ear lavage.May apply cotton ball at bedtime to prevent drainage to pillow. - carbamide peroxide (DEBROX) 6.5 % OTIC solution; Place 5 drops into the left ear 2 (two) times daily for 4 days.  Dispense: 2 mL; Refill: 0  Family/ staff Communication: Reviewed plan of care with patient and son verbalized understanding  Labs/tests ordered:  - CBC with Differential/Platelet - CMP with eGFR([facility]) - Lipid panel  Next Appointment : Return in about 6 months (around 03/05/2023) for medical mangement of chronic issues. one week for left ear lavage. Sandrea Hughs, NP

## 2022-09-04 LAB — CBC WITH DIFFERENTIAL/PLATELET
Absolute Monocytes: 513 cells/uL (ref 200–950)
Basophils Absolute: 30 cells/uL (ref 0–200)
Basophils Relative: 0.5 %
Eosinophils Absolute: 71 cells/uL (ref 15–500)
Eosinophils Relative: 1.2 %
HCT: 39.9 % (ref 35.0–45.0)
Hemoglobin: 13.7 g/dL (ref 11.7–15.5)
Lymphs Abs: 1823 cells/uL (ref 850–3900)
MCH: 32.9 pg (ref 27.0–33.0)
MCHC: 34.3 g/dL (ref 32.0–36.0)
MCV: 95.7 fL (ref 80.0–100.0)
MPV: 10.3 fL (ref 7.5–12.5)
Monocytes Relative: 8.7 %
Neutro Abs: 3463 cells/uL (ref 1500–7800)
Neutrophils Relative %: 58.7 %
Platelets: 290 10*3/uL (ref 140–400)
RBC: 4.17 10*6/uL (ref 3.80–5.10)
RDW: 12.4 % (ref 11.0–15.0)
Total Lymphocyte: 30.9 %
WBC: 5.9 10*3/uL (ref 3.8–10.8)

## 2022-09-04 LAB — COMPLETE METABOLIC PANEL WITH GFR
AG Ratio: 1.5 (calc) (ref 1.0–2.5)
ALT: 9 U/L (ref 6–29)
AST: 14 U/L (ref 10–35)
Albumin: 4.1 g/dL (ref 3.6–5.1)
Alkaline phosphatase (APISO): 63 U/L (ref 37–153)
BUN: 11 mg/dL (ref 7–25)
CO2: 29 mmol/L (ref 20–32)
Calcium: 10 mg/dL (ref 8.6–10.4)
Chloride: 95 mmol/L — ABNORMAL LOW (ref 98–110)
Creat: 0.82 mg/dL (ref 0.60–0.95)
Globulin: 2.7 g/dL (calc) (ref 1.9–3.7)
Glucose, Bld: 111 mg/dL — ABNORMAL HIGH (ref 65–99)
Potassium: 5.4 mmol/L — ABNORMAL HIGH (ref 3.5–5.3)
Sodium: 132 mmol/L — ABNORMAL LOW (ref 135–146)
Total Bilirubin: 0.8 mg/dL (ref 0.2–1.2)
Total Protein: 6.8 g/dL (ref 6.1–8.1)
eGFR: 70 mL/min/{1.73_m2} (ref >=60)

## 2022-09-04 LAB — LIPID PANEL
Cholesterol: 209 mg/dL — ABNORMAL HIGH (ref <200)
HDL: 111 mg/dL (ref >=50)
LDL Cholesterol (Calc): 87 mg/dL (calc)
Non-HDL Cholesterol (Calc): 98 mg/dL (calc) (ref <130)
Total CHOL/HDL Ratio: 1.9 (calc) (ref <5.0)
Triglycerides: 40 mg/dL (ref <150)

## 2022-09-06 DIAGNOSIS — M24412 Recurrent dislocation, left shoulder: Secondary | ICD-10-CM | POA: Diagnosis not present

## 2022-09-06 DIAGNOSIS — M19012 Primary osteoarthritis, left shoulder: Secondary | ICD-10-CM | POA: Diagnosis not present

## 2022-09-12 ENCOUNTER — Encounter: Payer: Medicare Other | Admitting: Family

## 2022-09-12 NOTE — Progress Notes (Signed)
  This encounter was created in error - please disregard. No show 

## 2022-09-17 NOTE — Progress Notes (Signed)
  This encounter was created in error - please disregard. No show 

## 2022-10-22 ENCOUNTER — Other Ambulatory Visit: Payer: Self-pay | Admitting: Family

## 2022-10-22 DIAGNOSIS — I10 Essential (primary) hypertension: Secondary | ICD-10-CM

## 2022-10-31 ENCOUNTER — Encounter: Payer: Self-pay | Admitting: Family Medicine

## 2022-10-31 ENCOUNTER — Ambulatory Visit (INDEPENDENT_AMBULATORY_CARE_PROVIDER_SITE_OTHER): Payer: Medicare Other | Admitting: Family Medicine

## 2022-10-31 VITALS — BP 136/80 | Temp 97.8°F | Ht 70.0 in | Wt 194.0 lb

## 2022-10-31 DIAGNOSIS — N3000 Acute cystitis without hematuria: Secondary | ICD-10-CM | POA: Diagnosis not present

## 2022-10-31 DIAGNOSIS — R300 Dysuria: Secondary | ICD-10-CM | POA: Diagnosis not present

## 2022-10-31 LAB — POCT URINALYSIS DIPSTICK
Bilirubin, UA: NEGATIVE
Blood, UA: NEGATIVE
Glucose, UA: NEGATIVE
Ketones, UA: NEGATIVE
Nitrite, UA: POSITIVE
Protein, UA: NEGATIVE
Spec Grav, UA: 1.015 (ref 1.010–1.025)
Urobilinogen, UA: 0.2 E.U./dL
pH, UA: 6 (ref 5.0–8.0)

## 2022-10-31 MED ORDER — NITROFURANTOIN MONOHYD MACRO 100 MG PO CAPS: 100.0000 mg | ORAL_CAPSULE | Freq: Two times a day (BID) | ORAL | 0 refills | Status: DC

## 2022-10-31 NOTE — Progress Notes (Signed)
Provider:  Alain Honey, MD  Careteam: Patient Care Team: Ngetich, Nelda Bucks, NP as PCP - General (Family Medicine) Marylynn Pearson, MD as Consulting Physician (Ophthalmology)  PLACE OF SERVICE:  [facility] Directive information Does Patient Have a Medical Advance Directive?: Yes, Type of Advance Directive: [place], Does patient want to make changes to medical advance directive?: No - Patient declined  No Known Allergies  Chief Complaint  Patient presents with   Acute Visit    Possible UTI. Patient c/o burning when urinating, extremely thirsty, and weakness, symptoms onset yesterday. Here with son, Alroy Dust     HPI: Patient is a 87 y.o. female .  Patient is accompanied by son with whom she lives.  Yesterday started complaining of some burning with urination.  No frequency.  No fever or chills.  She does have a history of dementia and there is been no increase in confusion or other behavioral changes accompanying dysuria.  There is a question of drinking enough liquids which really varies according to the son.  Review of Systems:  Review of Systems  Constitutional: Negative.   HENT: Negative.    Respiratory: Negative.    Cardiovascular: Negative.   Genitourinary:  Positive for dysuria. Negative for flank pain, frequency and hematuria.  Skin: Negative.   Neurological: Negative.   Psychiatric/Behavioral:  Positive for memory loss.     Past Medical History:  Diagnosis Date   Alzheimer disease ([place])    Asthma    Hyperlipidemia    Hypertension    Pulmonary embolism ([place]) 2004   was short of breath--was on coumadin for 6 mos to a year   Past Surgical History:  Procedure Laterality Date   ABDOMINAL HYSTERECTOMY     BUNIONECTOMY     CATARACT EXTRACTION Bilateral 06/17/2022   REPLACEMENT TOTAL KNEE  1980s, 2003   x2   Social History:   reports that she quit smoking about 65 years ago. Her smoking use included cigarettes. She has a 1.00  pack-year smoking history. She has never used smokeless tobacco. She reports current alcohol use of about 2.0 standard drinks of alcohol per week. She reports that she does not use drugs.  Family History  Problem Relation Age of Onset   Alzheimer's disease Mother    Chronic bronchitis Mother    Heart disease Father    High blood pressure Father    High blood pressure Brother    Asthma Daughter    High blood pressure Son    High Cholesterol Son    Post-traumatic stress disorder Son     Medications: Patient's Medications  New Prescriptions   No medications on file  Previous Medications   AMLODIPINE (NORVASC) 10 MG TABLET    TAKE 1 TABLET(10 MG) BY MOUTH DAILY   ASPIRIN 81 MG TABLET    Take 81 mg by mouth daily.   CALCIUM-VITAMIN D PO    Take 1 tablet by mouth 2 (two) times daily.   HYDROCODONE-ACETAMINOPHEN (NORCO/VICODIN) 5-325 MG TABLET    Take 1 tablet by mouth every 12 (twelve) hours as needed for severe pain.   IRBESARTAN (AVAPRO) 300 MG TABLET    TAKE 1 TABLET(300 MG) BY MOUTH DAILY   METHOCARBAMOL (ROBAXIN) 500 MG TABLET    TAKE 1 TABLET(500 MG) BY MOUTH DAILY   NITROGLYCERIN (NITROSTAT) 0.4 MG SL TABLET    Place 1 tablet (0.4 mg total) under the tongue every 5 (five) minutes x 3 doses as needed for chest  pain.   NYSTATIN CREAM (MYCOSTATIN)    Apply 1 application  topically 2 (two) times daily.   PRAVASTATIN (PRAVACHOL) 40 MG TABLET    TAKE 1 TABLET(40 MG) BY MOUTH DAILY  Modified Medications   No medications on file  Discontinued Medications   No medications on file    Physical Exam:  Vitals:   10/31/22 1042  BP: 136/80  Temp: 97.8 F (36.6 C)  TempSrc: Temporal  Weight: 194 lb (88 kg)  Height: 5' 10"$  (1.778 m)   Body mass index is 27.84 kg/m. Wt Readings from Last 3 Encounters:  10/31/22 194 lb (88 kg)  09/03/22 193 lb 6.4 oz (87.7 kg)  02/28/22 202 lb (91.6 kg)    Physical Exam Vitals and nursing note reviewed.  Constitutional:      Appearance: Normal  appearance.  Cardiovascular:     Rate and Rhythm: Normal rate and regular rhythm.  Pulmonary:     Effort: Pulmonary effort is normal.     Breath sounds: Normal breath sounds.  Abdominal:     General: Bowel sounds are normal.     Palpations: Abdomen is soft.     Tenderness: There is no abdominal tenderness.  Neurological:     General: No focal deficit present.     Mental Status: She is alert and oriented to person, place, and time.  Psychiatric:        Mood and Affect: Mood normal.        Behavior: Behavior normal.     Labs reviewed: Basic Metabolic Panel: Recent Labs    03/07/22 0947 09/03/22 0000  NA 133* 132*  K 5.2 5.4*  CL 97* 95*  CO2 27 29  GLUCOSE 97 111*  BUN 10 11  CREATININE 0.78 0.82  CALCIUM 10.0 10.0  TSH 1.35  --    Liver Function Tests: Recent Labs    03/07/22 0947 09/03/22 0000  AST 15 14  ALT 10 9  BILITOT 1.0 0.8  PROT 6.6 6.8   No results for input(s): "LIPASE", "AMYLASE" in the last 8760 hours. No results for input(s): "AMMONIA" in the last 8760 hours. CBC: Recent Labs    03/07/22 0947 09/03/22 0000  WBC 5.0 5.9  NEUTROABS 2,745 3,463  HGB 14.0 13.7  HCT 40.8 39.9  MCV 94.7 95.7  PLT 248 290   Lipid Panel: Recent Labs    03/07/22 0947 09/03/22 0000  CHOL 199 209*  HDL 102 111  LDLCALC 84 87  TRIG 52 40  CHOLHDL 2.0 1.9   TSH: Recent Labs    03/07/22 0947  TSH 1.35   A1C: Lab Results  Component Value Date   HGBA1C 5.3 03/08/2021     Assessment/Plan  1. Dysuria Patient unable to provide specimen today gave them materials needed to take home and bring back a specimen later today.  If urine shows abnormal cells will proceed with culture and sensitivity otherwise plan would be to encourage fluid intake with possible OTC trial of Pyridium Son brought specimen back to office later. I shows leucs and nitrite Plan: C&S   Alain Honey, MD [place] [phone]

## 2022-11-03 LAB — URINE CULTURE
MICRO NUMBER:: 14564772
SPECIMEN QUALITY:: ADEQUATE

## 2022-11-10 ENCOUNTER — Other Ambulatory Visit: Payer: Self-pay | Admitting: Family

## 2022-11-10 DIAGNOSIS — E785 Hyperlipidemia, unspecified: Secondary | ICD-10-CM

## 2022-11-12 ENCOUNTER — Encounter: Payer: Self-pay | Admitting: Podiatry

## 2022-11-12 ENCOUNTER — Ambulatory Visit: Payer: Medicare Other | Admitting: Podiatry

## 2022-11-12 VITALS — BP 153/76

## 2022-11-12 DIAGNOSIS — M79675 Pain in left toe(s): Secondary | ICD-10-CM

## 2022-11-12 DIAGNOSIS — B351 Tinea unguium: Secondary | ICD-10-CM | POA: Diagnosis not present

## 2022-11-12 DIAGNOSIS — M79674 Pain in right toe(s): Secondary | ICD-10-CM | POA: Diagnosis not present

## 2022-11-12 DIAGNOSIS — Q828 Other specified congenital malformations of skin: Secondary | ICD-10-CM | POA: Diagnosis not present

## 2022-11-12 NOTE — Progress Notes (Unsigned)
  Subjective:  Patient ID: Amy Andrade, female    DOB: [birth year],  MRN: [id number]  Amy Andrade presents to clinic today for {jgcomplaint:23593} No chief complaint on file.  New problem(s): None. {jgcomplaint:23593}  PCP is Ngetich, Dinah C, NP.  No Known Allergies  Review of Systems: Negative except as noted in the HPI.  Objective: No changes noted in today's physical examination. There were no vitals filed for this visit. Amy Andrade is a pleasant 87 y.o. female {jgbodyhabitus:24098} AAO x 3.  Vascular:  Capillary refill time to digits immediate b/l. Palpable pedal pulses b/l LE. Pedal hair sparse. Lower extremity skin temperature gradient within normal limits. No pain with calf compression b/l. Nonpitting edema noted b/l lower extremities.  Dermatological:  Pedal skin with normal turgor, texture and tone b/l lower extremities No open wounds b/l lower extremities No interdigital macerations b/l lower extremities Toenails 1-5 b/l elongated, discolored, dystrophic, thickened, crumbly with subungual debris and tenderness to dorsal palpation. Nailplate impinging on left 4th toe. No breaks in skin, no erythema, no edema, no drainage, no fluctuance.  Porokeratotic lesion(s) distal tip of right 3rd toe with tenderness to palpation. No erythema, no edema, no drainage, no fluctuance.  Musculoskeletal:  Normal muscle strength 5/5 to all lower extremity muscle groups bilaterally. No pain crepitus or joint limitation noted with ROM b/l. Hammertoe(s) noted to the 2-5 bilaterally.  Neurological:  Protective sensation intact 5/5 intact bilaterally with 10g monofilament b/l. Vibratory sensation intact b/l. Assessment/Plan: No diagnosis found.  No orders of the defined types were placed in this encounter.   None {Jgplan:23602::"-Patient/POA to call should there be question/concern in the interim."}   No follow-ups on file.  Marzetta Board, DPM

## 2022-11-30 ENCOUNTER — Encounter: Payer: Self-pay | Admitting: Family

## 2022-11-30 ENCOUNTER — Ambulatory Visit (INDEPENDENT_AMBULATORY_CARE_PROVIDER_SITE_OTHER): Payer: Medicare Other | Admitting: Family

## 2022-11-30 DIAGNOSIS — Z0000 Encounter for general adult medical examination without abnormal findings: Secondary | ICD-10-CM

## 2022-11-30 NOTE — Progress Notes (Addendum)
Subjective:   Amy Andrade is a 87 y.o. female who presents for Medicare Annual (Subsequent) preventive examination.  Review of Systems    Cardiac Risk Factors include: advanced age (>30men, >44 women);hypertension;sedentary lifestyle     Objective:    There were no vitals filed for this visit. There is no height or weight on file to calculate BMI.     11/30/2022    1:29 PM 10/31/2022   10:03 AM 09/03/2022   11:24 AM 02/27/2022    4:07 PM 11/28/2021    1:46 PM 08/30/2021    2:48 PM 03/08/2021    8:23 AM  Advanced Directives  Does Patient Have a Medical Advance Directive? Yes Yes Yes Yes Yes Yes Yes  Type of Industrial/product designer of [employer] Power of [place];Living will Healthcare Power of [place] of [place] of [place]  Does patient want to make changes to medical advance directive?  No - Patient declined No - Patient declined No - Patient declined No - Patient declined No - Patient declined No - Patient declined  Copy of [place] in Chart? Yes - validated most recent copy scanned in chart (See row information) Yes - validated most recent copy scanned in chart (See row information) Yes - validated most recent copy scanned in chart (See row information) Yes - validated most recent copy scanned in chart (See row information) Yes - validated most recent copy scanned in chart (See row information) Yes - validated most recent copy scanned in chart (See row information) Yes - validated most recent copy scanned in chart (See row information)    Current Medications (verified) Outpatient Encounter Medications as of 11/30/2022  Medication Sig   amLODipine (NORVASC) 10 MG tablet TAKE 1 TABLET(10 MG) BY MOUTH DAILY   aspirin 81 MG tablet Take 81 mg by mouth daily.   CALCIUM-VITAMIN D PO Take 1 tablet by mouth 2 (two) times daily.    HYDROcodone-acetaminophen (NORCO/VICODIN) 5-325 MG tablet Take 1 tablet by mouth every 12 (twelve) hours as needed for severe pain.   irbesartan (AVAPRO) 300 MG tablet TAKE 1 TABLET(300 MG) BY MOUTH DAILY   methocarbamol (ROBAXIN) 500 MG tablet TAKE 1 TABLET(500 MG) BY MOUTH DAILY   nitroGLYCERIN (NITROSTAT) 0.4 MG SL tablet Place 1 tablet (0.4 mg total) under the tongue every 5 (five) minutes x 3 doses as needed for chest pain.   nystatin cream (MYCOSTATIN) Apply 1 application  topically 2 (two) times daily.   pravastatin (PRAVACHOL) 40 MG tablet TAKE 1 TABLET(40 MG) BY MOUTH DAILY   [DISCONTINUED] nitrofurantoin, macrocrystal-monohydrate, (MACROBID) 100 MG capsule Take 1 capsule (100 mg total) by mouth 2 (two) times daily.   No facility-administered encounter medications on file as of 11/30/2022.    Allergies (verified) Patient has no known allergies.   History: Past Medical History:  Diagnosis Date   Alzheimer disease ([place])    Asthma    Hyperlipidemia    Hypertension    Pulmonary embolism ([place]) 2004   was short of breath--was on coumadin for 6 mos to a year   Past Surgical History:  Procedure Laterality Date   ABDOMINAL HYSTERECTOMY     BUNIONECTOMY     CATARACT EXTRACTION Bilateral 06/17/2022   REPLACEMENT TOTAL KNEE  1980s, 2003   x2   Family History  Problem Relation Age of Onset   Alzheimer's disease Mother    Chronic bronchitis Mother  Heart disease Father    High blood pressure Father    High blood pressure Brother    Asthma Daughter    High blood pressure Son    High Cholesterol Son    Post-traumatic stress disorder Son    Social History   Socioeconomic History   Marital status: Widowed    Spouse name: Not on file   Number of children: 2   Years of education: Not on file   Highest education level: Not on file  Occupational History   Not on file  Tobacco Use   Smoking status: Former    Packs/day: 0.25    Years: 4.00    Additional pack years: 0.00     Total pack years: 1.00    Types: Cigarettes    Quit date: 09/17/1957    Years since quitting: 65.2   Smokeless tobacco: Never  Vaping Use   Vaping Use: Never used  Substance and Sexual Activity   Alcohol use: Yes    Alcohol/week: 2.0 standard drinks of alcohol    Types: 2 Glasses of wine per week    Comment: 1 4oz cup with lunch and dinner   Drug use: No   Sexual activity: Not Currently  Other Topics Concern   Not on file  Social History Narrative   Diet: Eats well      Do you drink/ eat things with caffeine?Yes, 2 cups of coffee a day      Marital status:    Married--widowed in 2017                           What year were you married ? 1966      Do you live in a house, apartment,assistred living, condo, trailer, etc.)? House      Is it one or more stories? 2      How many persons live in your home ?  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  Yes                            Type & how often: Stationary bike 2 x/week      Do you have a living will? Yes      Do you have a DNR form?  Yes                     If not, do you want to discuss one?       Do you have signed POA?HPOA forms?   Yes              If so, please bring to your        appointment      Social Determinants of Health   Financial Resource Strain: Low Risk  (11/18/2017)   Overall Financial Resource Strain (CARDIA)    Difficulty of Paying Living Expenses: Not hard at all  Food Insecurity: No Food Insecurity (11/18/2017)   Hunger Vital Sign    Worried About Running Out of Food in the Last Year: Never true    Ran Out of Food in the Last Year: Never true  Transportation Needs: No Transportation Needs (11/18/2017)   PRAPARE - Hydrologist (Medical):  No    Lack of Transportation (Non-Medical): No  Physical Activity: Inactive (11/18/2017)   Exercise Vital Sign    Days of Exercise per Week: 0 days     Minutes of Exercise per Session: 0 min  Stress: No Stress Concern Present (11/18/2017)   [place]    Feeling of Stress : Only a little  Social Connections: Moderately Isolated (11/18/2017)   Social Connection and Isolation Panel [NHANES]    Frequency of Communication with Friends and Family: More than three times a week    Frequency of Social Gatherings with Friends and Family: More than three times a week    Attends Religious Services: Never    Marine scientist or Organizations: No    Attends Archivist Meetings: Never    Marital Status: Widowed    Tobacco Counseling Counseling given: Not Answered   Clinical Intake:  Pre-visit preparation completed: No  Pain : No/denies pain  BMI - recorded: 27.84 Nutritional Status: BMI 25 -29 Overweight Nutritional Risks: None Diabetes: No  Activities of Daily Living    11/30/2022    1:30 PM  In your present state of health, do you have any difficulty performing the following activities:  Hearing? 0  Vision? 0  Difficulty concentrating or making decisions? 1  Walking or climbing stairs? 1  Dressing or bathing? 1  Doing errands, shopping? 1  Preparing Food and eating ? Y  Using the Toilet? Y  In the past six months, have you accidently leaked urine? Y  Do you have problems with loss of bowel control? Y  Managing your Medications? Y  Managing your Finances? Y  Housekeeping or managing your Housekeeping? Y    Patient Care Team: Jabari Swoveland, Nelda Bucks, NP as PCP - General (Family Medicine) Marylynn Pearson, MD as Consulting Physician (Ophthalmology)  Indicate any recent Medical Services you may have received from other than [facility] providers in the past year (date may be approximate).     Assessment:   This is a routine wellness examination for Amy Andrade.  Hearing/Vision screen No results found.  Dietary issues and exercise activities discussed: Current  Exercise Habits: Home exercise routine, Type of exercise: walking, Time (Minutes): 20, Frequency (Times/Week): 7, Weekly Exercise (Minutes/Week): 140, Intensity: Mild, Exercise limited by: None identified   Goals Addressed             This Visit's Progress    Patient Stated       Maintain mobility        Depression Screen    11/30/2022    1:30 PM 11/28/2021    1:40 PM 11/24/2020    1:19 PM 12/09/2019    1:12 PM 11/23/2019    1:42 PM 11/18/2017    1:16 PM 10/22/2016   11:38 AM  PHQ 2/9 Scores  PHQ - 2 Score 0 0 0 0 0 1 0    Fall Risk    11/30/2022    1:29 PM 10/31/2022   10:46 AM 09/03/2022   11:24 AM 11/28/2021    1:40 PM 08/30/2021    2:47 PM  [place] in the past year? 0 0 0 0 0  Number falls in past yr: 0 0 0 0 0  Injury with Fall? 0 0 0 0 0  Risk for fall due to : No Fall Risks No Fall Risks No Fall Risks No Fall Risks No Fall Risks  Follow up Falls evaluation completed;Education provided;Falls  prevention discussed Falls evaluation completed Falls evaluation completed Falls evaluation completed Falls evaluation completed    FALL RISK PREVENTION PERTAINING TO THE HOME:  Any stairs in or around the home? No  If so, are there any without handrails? No  Home free of loose throw rugs in walkways, pet beds, electrical cords, etc? No  Adequate lighting in your home to reduce risk of falls? Yes   ASSISTIVE DEVICES UTILIZED TO PREVENT FALLS:  Life alert? Yes  Use of a cane, walker or w/c? Yes  Grab bars in the bathroom? Yes  Shower chair or bench in shower? Yes  Elevated toilet seat or a handicapped toilet? Yes   TIMED UP AND GO:  Was the test performed? No .  Length of time to ambulate 10 feet: N/A  sec.   Gait slow and steady with assistive device  Cognitive Function:    11/18/2017    1:31 PM 06/18/2016    9:47 AM 04/21/2015    9:13 AM  MMSE - Mini Mental State Exam  Orientation to time 2 3 2   Orientation to Place 5 5 5   Registration 3 3 3   Attention/  Calculation 5 5 5   Recall 1 3 1   Language- name 2 objects 2 2 2   Language- repeat 1 1 1   Language- follow 3 step command 3 3 3   Language- read & follow direction 1 1 1   Write a sentence 1 1 1   Copy design 1 1 1   Total score 25 28 25         11/30/2022    1:32 PM 11/28/2021    1:41 PM 11/24/2020    1:21 PM 11/23/2019    1:42 PM  6CIT Screen  What Year? 4 points 4 points 0 points 4 points  What month? 3 points 3 points 0 points 3 points  What time? 0 points 0 points 0 points 0 points  Count back from 20 0 points 0 points 0 points 0 points  Months in reverse 0 points 0 points 0 points 0 points  Repeat phrase 10 points 10 points 10 points 0 points  Total Score 17 points 17 points 10 points 7 points    Immunizations Immunization History  Administered Date(s) Administered   Fluad Quad(high Dose 65+) 08/30/2021, 09/03/2022   Influenza, High Dose Seasonal PF 10/27/2018, 07/18/2020   Influenza,inj,Quad PF,6+ Mos 05/27/2015, 06/18/2016   Influenza-Unspecified 05/18/2017   Pneumococcal Conjugate-13 10/11/2014   Pneumococcal Polysaccharide-23 01/20/2016   Pneumococcal-Unspecified 09/17/2020   Tdap 09/17/2010   Zoster Recombinat (Shingrix) 09/17/2020   Zoster, Live 07/22/2014    TDAP status: Due, Education has been provided regarding the importance of this vaccine. Advised may receive this vaccine at local pharmacy or Health Dept. Aware to provide a copy of the vaccination record if obtained from local pharmacy or Health Dept. Verbalized acceptance and understanding.  Flu Vaccine status: Up to date  Pneumococcal vaccine status: Up to date  Covid-19 vaccine status: Information provided on how to obtain vaccines.   Qualifies for Shingles Vaccine? Yes   Zostavax completed No   Shingrix Completed?: No.    Education has been provided regarding the importance of this vaccine. Patient has been advised to call insurance company to determine out of pocket expense if they have not yet received  this vaccine. Advised may also receive vaccine at local pharmacy or Health Dept. Verbalized acceptance and understanding.  Screening Tests Health Maintenance  Topic Date Due   COVID-19 Vaccine (1) Never done  DTaP/Tdap/Td (2 - Td or Tdap) 09/17/2020   Zoster Vaccines- Shingrix (2 of 2) 11/12/2020   Medicare Annual Wellness (AWV)  11/30/2023   Pneumonia Vaccine 60+ Years old  Completed   INFLUENZA VACCINE  Completed   DEXA SCAN  Completed   HPV VACCINES  Aged Out    Health Maintenance  Health Maintenance Due  Topic Date Due   COVID-19 Vaccine (1) Never done   DTaP/Tdap/Td (2 - Td or Tdap) 09/17/2020   Zoster Vaccines- Shingrix (2 of 2) 11/12/2020    Colorectal cancer screening: No longer required.   Mammogram status: No longer required due to advance age .  Bone Density status: Completed 04/04/2022 . Results reflect: Bone density results: OSTEOPENIA. Repeat every 2 years.  Lung Cancer Screening: (Low Dose CT Chest recommended if Age 47-80 years, 30 pack-year currently smoking OR have quit w/in 15years.) does not qualify.   Lung Cancer Screening Referral: No   Additional Screening:  Hepatitis C Screening: does not qualify; Completed No   Vision Screening: Recommended annual ophthalmology exams for early detection of glaucoma and other disorders of the eye. Is the patient up to date with their annual eye exam?  Yes  Who is the provider or what is the name of the office in which the patient attends annual eye exams? [place]  If pt is not established with a provider, would they like to be referred to a provider to establish care? No .   Dental Screening: Recommended annual dental exams for proper oral hygiene  Community Resource Referral / Chronic Care Management: CRR required this visit?  No   CCM required this visit?  No      Plan:     I have personally reviewed and noted the following in the patient's chart:   Medical and social history Use of alcohol, tobacco or  illicit drugs  Current medications and supplements including opioid prescriptions. Patient is not currently taking opioid prescriptions. Functional ability and status Nutritional status Physical activity Advanced directives List of other physicians Hospitalizations, surgeries, and ER visits in previous 12 months Vitals Screenings to include cognitive, depression, and falls Referrals and appointments  In addition, I have reviewed and discussed with patient certain preventive protocols, quality metrics, and best practice recommendations. A written personalized care plan for preventive services as well as general preventive health recommendations were provided to patient.    Caesar Bookman, NP   12/04/2022   Nurse Notes: Advised Dtap and shingles at the pharmacy   This service is provided via telemedicine  No vital signs collected/recorded due to the encounter was a telemedicine visit.   Location of patient (ex: home, work):  Home   Patient consents to a telephone visit:  yes  Location of the provider (ex: office, home):  [facility] office   Name of any referring provider:  Tehran Rabenold,FNP-C   I connected with Amy Andrade. Amy Andrade on 11/30/2022 by a video enabled telemedicine application and verified that I am speaking with the correct person using two identifiers.   I discussed the limitations of evaluation and management by telemedicine. The patient expressed understanding and agreed to proceed.

## 2022-12-04 NOTE — Patient Instructions (Signed)
Amy Andrade , Thank you for taking time to come for your Medicare Wellness Visit. I appreciate your ongoing commitment to your health goals. Please review the following plan we discussed and let me know if I can assist you in the future.   Screening recommendations/referrals: Colonoscopy : N/A  Mammogram N/A Bone Density N/A  Recommended yearly ophthalmology/optometry visit for glaucoma screening and checkup Recommended yearly dental visit for hygiene and checkup  Vaccinations: Influenza vaccine- due annually in September/October Pneumococcal vaccine : up to date  Tdap vaccine : Due please get vaccine at your pharmacy  Shingles vaccine Due please get vaccine at your pharmac    Advanced directives: yes   Conditions/risks identified:  advanced age (>78men, >58 women);hypertension;sedentary lifestyle  Next appointment: 1 year    Preventive Care 9 Years and Older, Female Preventive care refers to lifestyle choices and visits with your health care provider that can promote health and wellness. What does preventive care include? A yearly physical exam. This is also called an annual well check. Dental exams once or twice a year. Routine eye exams. Ask your health care provider how often you should have your eyes checked. Personal lifestyle choices, including: Daily care of your teeth and gums. Regular physical activity. Eating a healthy diet. Avoiding tobacco and drug use. Limiting alcohol use. Practicing safe sex. Taking low-dose aspirin every day. Taking vitamin and mineral supplements as recommended by your health care provider. What happens during an annual well check? The services and screenings done by your health care provider during your annual well check will depend on your age, overall health, lifestyle risk factors, and family history of disease. Counseling  Your health care provider may ask you questions about your: Alcohol use. Tobacco use. Drug use. Emotional  well-being. Home and relationship well-being. Sexual activity. Eating habits. History of falls. Memory and ability to understand (cognition). Work and work Statistician. Reproductive health. Screening  You may have the following tests or measurements: Height, weight, and BMI. Blood pressure. Lipid and cholesterol levels. These may be checked every 5 years, or more frequently if you are over 78 years old. Skin check. Lung cancer screening. You may have this screening every year starting at age 75 if you have a 30-pack-year history of smoking and currently smoke or have quit within the past 15 years. Fecal occult blood test (FOBT) of the stool. You may have this test every year starting at age 24. Flexible sigmoidoscopy or colonoscopy. You may have a sigmoidoscopy every 5 years or a colonoscopy every 10 years starting at age 67. Hepatitis C blood test. Hepatitis B blood test. Sexually transmitted disease (STD) testing. Diabetes screening. This is done by checking your blood sugar (glucose) after you have not eaten for a while (fasting). You may have this done every 1-3 years. Bone density scan. This is done to screen for osteoporosis. You may have this done starting at age 6. Mammogram. This may be done every 1-2 years. Talk to your health care provider about how often you should have regular mammograms. Talk with your health care provider about your test results, treatment options, and if necessary, the need for more tests. Vaccines  Your health care provider may recommend certain vaccines, such as: Influenza vaccine. This is recommended every year. Tetanus, diphtheria, and acellular pertussis (Tdap, Td) vaccine. You may need a Td booster every 10 years. Zoster vaccine. You may need this after age 65. Pneumococcal 13-valent conjugate (PCV13) vaccine. One dose is recommended after age 63. Pneumococcal  polysaccharide (PPSV23) vaccine. One dose is recommended after age 71. Talk to your  health care provider about which screenings and vaccines you need and how often you need them. This information is not intended to replace advice given to you by your health care provider. Make sure you discuss any questions you have with your health care provider. Document Released: 09/30/2015 Document Revised: 05/23/2016 Document Reviewed: 07/05/2015 Elsevier Interactive Patient Education  2017 [employer] Prevention in the Home Falls can cause injuries. They can happen to people of all ages. There are many things you can do to make your home safe and to help prevent falls. What can I do on the outside of my home? Regularly fix the edges of walkways and driveways and fix any cracks. Remove anything that might make you trip as you walk through a door, such as a raised step or threshold. Trim any bushes or trees on the path to your home. Use bright outdoor lighting. Clear any walking paths of anything that might make someone trip, such as rocks or tools. Regularly check to see if handrails are loose or broken. Make sure that both sides of any steps have handrails. Any raised decks and porches should have guardrails on the edges. Have any leaves, snow, or ice cleared regularly. Use sand or salt on walking paths during winter. Clean up any spills in your garage right away. This includes oil or grease spills. What can I do in the bathroom? Use night lights. Install grab bars by the toilet and in the tub and shower. Do not use towel bars as grab bars. Use non-skid mats or decals in the tub or shower. If you need to sit down in the shower, use a plastic, non-slip stool. Keep the floor dry. Clean up any water that spills on the floor as soon as it happens. Remove soap buildup in the tub or shower regularly. Attach bath mats securely with double-sided non-slip rug tape. Do not have throw rugs and other things on the floor that can make you trip. What can I do in the bedroom? Use night  lights. Make sure that you have a light by your bed that is easy to reach. Do not use any sheets or blankets that are too big for your bed. They should not hang down onto the floor. Have a firm chair that has side arms. You can use this for support while you get dressed. Do not have throw rugs and other things on the floor that can make you trip. What can I do in the kitchen? Clean up any spills right away. Avoid walking on wet floors. Keep items that you use a lot in easy-to-reach places. If you need to reach something above you, use a strong step stool that has a grab bar. Keep electrical cords out of the way. Do not use floor polish or wax that makes floors slippery. If you must use wax, use non-skid floor wax. Do not have throw rugs and other things on the floor that can make you trip. What can I do with my stairs? Do not leave any items on the stairs. Make sure that there are handrails on both sides of the stairs and use them. Fix handrails that are broken or loose. Make sure that handrails are as long as the stairways. Check any carpeting to make sure that it is firmly attached to the stairs. Fix any carpet that is loose or worn. Avoid having throw rugs at the top  or bottom of the stairs. If you do have throw rugs, attach them to the floor with carpet tape. Make sure that you have a light switch at the top of the stairs and the bottom of the stairs. If you do not have them, ask someone to add them for you. What else can I do to help prevent falls? Wear shoes that: Do not have high heels. Have rubber bottoms. Are comfortable and fit you well. Are closed at the toe. Do not wear sandals. If you use a stepladder: Make sure that it is fully opened. Do not climb a closed stepladder. Make sure that both sides of the stepladder are locked into place. Ask someone to hold it for you, if possible. Clearly mark and make sure that you can see: Any grab bars or handrails. First and last  steps. Where the edge of each step is. Use tools that help you move around (mobility aids) if they are needed. These include: Canes. Walkers. Scooters. Crutches. Turn on the lights when you go into a dark area. Replace any light bulbs as soon as they burn out. Set up your furniture so you have a clear path. Avoid moving your furniture around. If any of your floors are uneven, fix them. If there are any pets around you, be aware of where they are. Review your medicines with your doctor. Some medicines can make you feel dizzy. This can increase your chance of falling. Ask your doctor what other things that you can do to help prevent falls. This information is not intended to replace advice given to you by your health care provider. Make sure you discuss any questions you have with your health care provider. Document Released: 06/30/2009 Document Revised: 02/09/2016 Document Reviewed: 10/08/2014 Elsevier Interactive Patient Education  2017 [employer].

## 2022-12-06 DIAGNOSIS — M24412 Recurrent dislocation, left shoulder: Secondary | ICD-10-CM | POA: Diagnosis not present

## 2023-01-15 ENCOUNTER — Other Ambulatory Visit: Payer: Self-pay | Admitting: Nurse Practitioner

## 2023-01-15 DIAGNOSIS — G8929 Other chronic pain: Secondary | ICD-10-CM

## 2023-02-12 ENCOUNTER — Other Ambulatory Visit: Payer: Self-pay | Admitting: Family

## 2023-02-12 DIAGNOSIS — I10 Essential (primary) hypertension: Secondary | ICD-10-CM

## 2023-03-05 ENCOUNTER — Encounter: Payer: Self-pay | Admitting: Family

## 2023-03-05 ENCOUNTER — Ambulatory Visit (INDEPENDENT_AMBULATORY_CARE_PROVIDER_SITE_OTHER): Payer: Medicare Other | Admitting: Family

## 2023-03-05 ENCOUNTER — Encounter: Payer: Self-pay | Admitting: Podiatry

## 2023-03-05 ENCOUNTER — Ambulatory Visit (INDEPENDENT_AMBULATORY_CARE_PROVIDER_SITE_OTHER): Payer: Medicare Other | Admitting: Podiatry

## 2023-03-05 VITALS — BP 128/80 | HR 63 | Temp 97.5°F | Resp 18 | Ht 70.0 in | Wt 178.6 lb

## 2023-03-05 DIAGNOSIS — G301 Alzheimer's disease with late onset: Secondary | ICD-10-CM | POA: Diagnosis not present

## 2023-03-05 DIAGNOSIS — M79674 Pain in right toe(s): Secondary | ICD-10-CM | POA: Diagnosis not present

## 2023-03-05 DIAGNOSIS — E782 Mixed hyperlipidemia: Secondary | ICD-10-CM | POA: Diagnosis not present

## 2023-03-05 DIAGNOSIS — M79675 Pain in left toe(s): Secondary | ICD-10-CM | POA: Diagnosis not present

## 2023-03-05 DIAGNOSIS — Q828 Other specified congenital malformations of skin: Secondary | ICD-10-CM

## 2023-03-05 DIAGNOSIS — Z23 Encounter for immunization: Secondary | ICD-10-CM | POA: Diagnosis not present

## 2023-03-05 DIAGNOSIS — L853 Xerosis cutis: Secondary | ICD-10-CM

## 2023-03-05 DIAGNOSIS — M25512 Pain in left shoulder: Secondary | ICD-10-CM

## 2023-03-05 DIAGNOSIS — H6122 Impacted cerumen, left ear: Secondary | ICD-10-CM | POA: Diagnosis not present

## 2023-03-05 DIAGNOSIS — J4520 Mild intermittent asthma, uncomplicated: Secondary | ICD-10-CM | POA: Diagnosis not present

## 2023-03-05 DIAGNOSIS — G8929 Other chronic pain: Secondary | ICD-10-CM

## 2023-03-05 DIAGNOSIS — I10 Essential (primary) hypertension: Secondary | ICD-10-CM

## 2023-03-05 DIAGNOSIS — B351 Tinea unguium: Secondary | ICD-10-CM | POA: Diagnosis not present

## 2023-03-05 DIAGNOSIS — E785 Hyperlipidemia, unspecified: Secondary | ICD-10-CM

## 2023-03-05 DIAGNOSIS — F0280 Dementia in other diseases classified elsewhere without behavioral disturbance: Secondary | ICD-10-CM

## 2023-03-05 MED ORDER — TRIAMCINOLONE ACETONIDE 0.1 % EX CREA: 1.0000 | TOPICAL_CREAM | Freq: Two times a day (BID) | CUTANEOUS | 0 refills | Status: DC

## 2023-03-05 MED ORDER — DEBROX 6.5 % OT SOLN: 5.0000 [drp] | Freq: Two times a day (BID) | OTIC | 0 refills | Status: AC

## 2023-03-05 NOTE — Progress Notes (Unsigned)
Provider: Richarda Blade FNP-C   Amy Andrade, Amy Citrin, NP  Patient Care Team: Amy Andrade, Amy Citrin, NP as PCP - General (Family Medicine) Chalmers Guest, MD as Consulting Physician (Ophthalmology)  Extended Emergency Contact Information Primary Emergency Contact: Andrade,Amy Address: [address] [place], [place] [address] D[place] of [place] Home Phone: [phone] Relation: Other Secondary Emergency Contact: Andrade,Amy Address: [address]          [place], [place] [address] [place] of [place] Home Phone: [phone] Relation: Son  Code Status:  Full Code  Goals of care: Advanced Directive information    03/05/2023    8:31 AM  Advanced Directives  Does Patient Have a Medical Advance Directive? Yes  Type of Advance Directive Healthcare Power of Attorney  Does patient want to make changes to medical advance directive? No - Patient declined  Copy of Healthcare Power of Attorney in Chart? Yes - validated most recent copy scanned in chart (See row information)     Chief Complaint  Patient presents with   Medical Management of Chronic Issues    Patient is here for a 48M follow up for chronic conditions    Quality Metric Gaps    Patient is due for 2nd shingrix vaccine, and covid booster    HPI:  Pt is a 87 y.o. female seen today for 6 months follow up for medical management of chronic diseases.   Here with son states has assistant 24 hrs. Has a rolling walker that uses at home and wheelchair with travel.  Does puzzles,crosswords and word search daily.son states short term memory stable.   Hypertension - no home B/p for review.she denies any headache,dizziness,vision changes,fatigue,chest tightness,palpitation,chest pain or shortness of breath.      Left shoulder pain - use Norco for breakthrough pain only.Has appointment with orthopedic in 2 days.   Asthma - no wheezing or shortness of breath.   Due for Tdap ,shingles and COVID-19  vaccine.will get Tdap here and Shingles and COVId-19 at the pharmacy.   Past Medical History:  Diagnosis Date   Alzheimer disease (HCC)    Asthma    Hyperlipidemia    Hypertension    Pulmonary embolism (HCC) 2004   was short of breath--was on coumadin for 6 mos to a year   Past Surgical History:  Procedure Laterality Date   ABDOMINAL HYSTERECTOMY     BUNIONECTOMY     CATARACT EXTRACTION Bilateral 06/17/2022   REPLACEMENT TOTAL KNEE  1980s, 2003   x2    No Known Allergies  Allergies as of 03/05/2023   No Known Allergies      Medication List        Accurate as of March 05, 2023  9:46 AM. If you have any questions, ask your nurse or doctor.          amLODipine 10 MG tablet Commonly known as: NORVASC TAKE 1 TABLET(10 MG) BY MOUTH DAILY   aspirin 81 MG tablet Take 81 mg by mouth daily.   CALCIUM-VITAMIN D PO Take 1 tablet by mouth 2 (two) times daily.   HYDROcodone-acetaminophen 5-325 MG tablet Commonly known as: NORCO/VICODIN Take 1 tablet by mouth every 12 (twelve) hours as needed for severe pain.   irbesartan 300 MG tablet Commonly known as: AVAPRO TAKE 1 TABLET(300 MG) BY MOUTH DAILY   methocarbamol 500 MG tablet Commonly known as: ROBAXIN TAKE 1 TABLET(500 MG)  BY MOUTH DAILY   nitroGLYCERIN 0.4 MG SL tablet Commonly known as: NITROSTAT Place 1 tablet (0.4 mg total) under the tongue every 5 (five) minutes x 3 doses as needed for chest pain.   nystatin cream Commonly known as: MYCOSTATIN Apply 1 application  topically 2 (two) times daily.   pravastatin 40 MG tablet Commonly known as: PRAVACHOL TAKE 1 TABLET(40 MG) BY MOUTH DAILY        Review of Systems  Constitutional:  Negative for appetite change, chills, fatigue, fever and unexpected weight change.  HENT:  Negative for congestion, dental problem, ear discharge, ear pain, facial swelling, hearing loss, nosebleeds, postnasal drip, rhinorrhea, sinus pressure, sinus pain, sneezing, sore throat,  tinnitus and trouble swallowing.   Eyes:  Negative for pain, discharge, redness, itching and visual disturbance.  Respiratory:  Negative for cough, chest tightness, shortness of breath and wheezing.   Cardiovascular:  Negative for chest pain, palpitations and leg swelling.  Gastrointestinal:  Negative for abdominal distention, abdominal pain, blood in stool, constipation, diarrhea, nausea and vomiting.  Endocrine: Negative for cold intolerance, heat intolerance, polydipsia, polyphagia and polyuria.  Genitourinary:  Negative for difficulty urinating, dysuria, flank pain, frequency and urgency.  Musculoskeletal:  Positive for gait problem. Negative for arthralgias, back pain, joint swelling, myalgias, neck pain and neck stiffness.  Skin:  Negative for color change, pallor, rash and wound.  Neurological:  Negative for dizziness, syncope, speech difficulty, weakness, light-headedness, numbness and headaches.  Hematological:  Does not bruise/bleed easily.  Psychiatric/Behavioral:  Negative for agitation, behavioral problems, confusion, hallucinations, self-injury, sleep disturbance and suicidal ideas. The patient is not nervous/anxious.     Immunization History  Administered Date(s) Administered   Fluad Quad(high Dose 65+) 08/30/2021, 09/03/2022   Influenza, High Dose Seasonal PF 10/27/2018, 07/18/2020   Influenza,inj,Quad PF,6+ Mos 05/27/2015, 06/18/2016   Influenza-Unspecified 05/18/2017   Pneumococcal Conjugate-13 10/11/2014   Pneumococcal Polysaccharide-23 01/20/2016   Pneumococcal-Unspecified 09/17/2020   Tdap 09/17/2010, 03/05/2023   Zoster Recombinat (Shingrix) 09/17/2020   Zoster, Live 07/22/2014   Pertinent  Health Maintenance Due  Topic Date Due   INFLUENZA VACCINE  04/18/2023   DEXA SCAN  Completed      11/28/2021    1:40 PM 09/03/2022   11:24 AM 10/31/2022   10:46 AM 11/30/2022    1:29 PM 03/05/2023    8:31 AM  Fall Risk  Falls in the past year? 0 0 0 0 0  Was there an  injury with Fall? 0 0 0 0 0  Fall Risk Category Calculator 0 0 0 0 0  Fall Risk Category (Retired) Low Low     (RETIRED) Patient Fall Risk Level Low fall risk Low fall risk     Patient at Risk for Falls Due to No Fall Risks No Fall Risks No Fall Risks No Fall Risks No Fall Risks  Fall risk Follow up Falls evaluation completed Falls evaluation completed Falls evaluation completed Falls evaluation completed;Education provided;Falls prevention discussed Falls evaluation completed   Functional Status Survey:    Vitals:   03/05/23 0830  BP: 128/80  Pulse: 63  Resp: 18  Temp: (!) 97.5 F (36.4 C)  SpO2: 94%  Weight: 178 lb 9.6 oz (81 kg)  Height: 5\' 10"  (1.778 m)   Body mass index is 25.63 kg/m. Physical Exam Vitals reviewed.  Constitutional:      General: She is not in acute distress.    Appearance: Normal appearance. She is normal weight. She is not ill-appearing or diaphoretic.  HENT:  Head: Normocephalic.     Right Ear: Tympanic membrane, ear canal and external ear normal. There is no impacted cerumen.     Left Ear: There is impacted cerumen.     Nose: Nose normal. No congestion or rhinorrhea.     Mouth/Throat:     Mouth: Mucous membranes are moist.     Pharynx: Oropharynx is clear. No oropharyngeal exudate or posterior oropharyngeal erythema.  Eyes:     General: No scleral icterus.       Right eye: No discharge.        Left eye: No discharge.     Extraocular Movements: Extraocular movements intact.     Conjunctiva/sclera: Conjunctivae normal.     Pupils: Pupils are equal, round, and reactive to light.  Neck:     Vascular: No carotid bruit.  Cardiovascular:     Rate and Rhythm: Normal rate and regular rhythm.     Pulses: Normal pulses.     Heart sounds: Normal heart sounds. No murmur heard.    No friction rub. No gallop.  Pulmonary:     Effort: Pulmonary effort is normal. No respiratory distress.     Breath sounds: Normal breath sounds. No wheezing, rhonchi or  rales.  Chest:     Chest wall: No tenderness.  Abdominal:     General: Bowel sounds are normal. There is no distension.     Palpations: Abdomen is soft. There is no mass.     Tenderness: There is no abdominal tenderness. There is no right CVA tenderness, left CVA tenderness, guarding or rebound.  Musculoskeletal:        General: No swelling or tenderness. Normal range of motion.     Cervical back: Normal range of motion. No rigidity or tenderness.     Right lower leg: Edema present.     Left lower leg: Edema present.  Lymphadenopathy:     Cervical: No cervical adenopathy.  Skin:    General: Skin is warm and dry.     Coloration: Skin is not pale.     Findings: No bruising, erythema, lesion or rash.     Comments: Dry scaly skin dorsal foot.  Neurological:     Mental Status: She is alert. Mental status is at baseline.     Cranial Nerves: No cranial nerve deficit.     Sensory: No sensory deficit.     Motor: No weakness.     Coordination: Coordination normal.     Gait: Gait abnormal.  Psychiatric:        Mood and Affect: Mood normal.        Speech: Speech normal.        Behavior: Behavior normal.        Thought Content: Thought content normal.        Cognition and Memory: She exhibits impaired recent memory.        Judgment: Judgment normal.     Labs reviewed: Recent Labs    03/07/22 0947 09/03/22 0000  NA 133* 132*  K 5.2 5.4*  CL 97* 95*  CO2 27 29  GLUCOSE 97 111*  BUN 10 11  CREATININE 0.78 0.82  CALCIUM 10.0 10.0   Recent Labs    03/07/22 0947 09/03/22 0000  AST 15 14  ALT 10 9  BILITOT 1.0 0.8  PROT 6.6 6.8   Recent Labs    03/07/22 0947 09/03/22 0000  WBC 5.0 5.9  NEUTROABS 2,745 3,463  HGB 14.0 13.7  HCT 40.8 39.9  MCV  94.7 95.7  PLT 248 290   Lab Results  Component Value Date   TSH 1.35 03/07/2022   Lab Results  Component Value Date   HGBA1C 5.3 03/08/2021   Lab Results  Component Value Date   CHOL 209 (H) 09/03/2022   HDL 111  09/03/2022   LDLCALC 87 09/03/2022   TRIG 40 09/03/2022   CHOLHDL 1.9 09/03/2022    Significant Diagnostic Results in last 30 days:  No results found.  Assessment/Plan 1. Need for Tdap vaccination Afebrile  PNA vaccine administered today by CMA no reaction reported.  - Tdap vaccine greater than or equal to 7yo IM  2. Essential hypertension Blood pressure well-controlled -Follow-up continue irbesartan and amlodipine  - TSH; Future - COMPLETE METABOLIC PANEL WITH GFR; Future - CBC with Differential/Platelet; Future  3. Mild intermittent asthma, unspecified whether complicated Breathing stable no wheezes noted shortness of breath  4. Hyperlipidemia, unspecified hyperlipidemia type LDL at goal. -Continue pravastatin - Lipid panel; Future  5. Chronic left shoulder pain Continue on Norco  6. Late onset Alzheimer's disease without behavioral disturbance (HCC) No new behavioral issues reported  -Continue with supportive care  7. Dry skin dermatitis Dry scaly skin dorsal foot. - start on Triamcinolone  - triamcinolone cream (KENALOG) 0.1 %; Apply 1 Application topically 2 (two) times daily. Affected area on right foot  Dispense: 30 g; Refill: 0  8. Impacted cerumen of left ear TM not visualized due to cerumen impaction. - advised to Instill debrox 6.5 otic solution 5 drops into left ear twice daily x 4 days then follow up for ear lavage.May apply cotton ball at bedtime to prevent drainage to pillow.  - carbamide peroxide (DEBROX) 6.5 % OTIC solution; Place 5 drops into the left ear 2 (two) times daily for 4 days.  Dispense: 2 mL; Refill: 0  Family/ staff Communication: Reviewed plan of care with patient and son verbalized understanding  Labs/tests ordered:  - Lipid panel; Future - TSH; Future - COMPLETE METABOLIC PANEL WITH GFR; Future - CBC with Differential/Platelet; Future  Next Appointment : Return in about 6 months (around 09/04/2023) for medical management of  chronic issues.Fasting labs and left ear lavage on Friday .   Caesar Bookman, NP

## 2023-03-05 NOTE — Patient Instructions (Signed)
Please get shingles and COVID-19 vaccine at the pharmacy.

## 2023-03-08 ENCOUNTER — Other Ambulatory Visit: Payer: Medicare Other

## 2023-03-08 ENCOUNTER — Ambulatory Visit (INDEPENDENT_AMBULATORY_CARE_PROVIDER_SITE_OTHER): Payer: Medicare Other | Admitting: Adult Health

## 2023-03-08 ENCOUNTER — Encounter: Payer: Self-pay | Admitting: Adult Health

## 2023-03-08 VITALS — BP 120/80 | HR 60 | Temp 97.8°F | Resp 18 | Ht 70.0 in

## 2023-03-08 DIAGNOSIS — F0280 Dementia in other diseases classified elsewhere without behavioral disturbance: Secondary | ICD-10-CM

## 2023-03-08 DIAGNOSIS — I10 Essential (primary) hypertension: Secondary | ICD-10-CM | POA: Diagnosis not present

## 2023-03-08 DIAGNOSIS — E782 Mixed hyperlipidemia: Secondary | ICD-10-CM

## 2023-03-08 DIAGNOSIS — G8929 Other chronic pain: Secondary | ICD-10-CM | POA: Diagnosis not present

## 2023-03-08 DIAGNOSIS — M24412 Recurrent dislocation, left shoulder: Secondary | ICD-10-CM | POA: Diagnosis not present

## 2023-03-08 DIAGNOSIS — H6122 Impacted cerumen, left ear: Secondary | ICD-10-CM | POA: Diagnosis not present

## 2023-03-08 DIAGNOSIS — M25512 Pain in left shoulder: Secondary | ICD-10-CM | POA: Diagnosis not present

## 2023-03-08 DIAGNOSIS — G301 Alzheimer's disease with late onset: Secondary | ICD-10-CM

## 2023-03-08 NOTE — Progress Notes (Signed)
[facility] clinic  Provider:  Kenard Gower DNP  Code Status:  Full Code  Goals of Care:     03/08/2023    8:05 AM  Advanced Directives  Does Patient Have a Medical Advance Directive? Yes  Type of Advance Directive Healthcare Power of Attorney  Does patient want to make changes to medical advance directive? No - Patient declined  Copy of Healthcare Power of Attorney in Chart? Yes - validated most recent copy scanned in chart (See row information)     Chief Complaint  Patient presents with   Acute Visit    ear lavage    HPI: Patient is a 87 y.o. female seen today for an acute visit for left ear lavage. She completed Debrox otic drops X 4 days. Denies pain and change in hearing  Ear lavage done and obtained moderate amount of yellowish cerumen from left ear.   Past Medical History:  Diagnosis Date   Alzheimer disease (HCC)    Asthma    Hyperlipidemia    Hypertension    Pulmonary embolism (HCC) 2004   was short of breath--was on coumadin for 6 mos to a year    Past Surgical History:  Procedure Laterality Date   ABDOMINAL HYSTERECTOMY     BUNIONECTOMY     CATARACT EXTRACTION Bilateral 06/17/2022   REPLACEMENT TOTAL KNEE  1980s, 2003   x2    No Known Allergies  Outpatient Encounter Medications as of 03/08/2023  Medication Sig   amLODipine (NORVASC) 10 MG tablet TAKE 1 TABLET(10 MG) BY MOUTH DAILY   aspirin 81 MG tablet Take 81 mg by mouth daily.   CALCIUM-VITAMIN D PO Take 1 tablet by mouth 2 (two) times daily.   carbamide peroxide (DEBROX) 6.5 % OTIC solution Place 5 drops into the left ear 2 (two) times daily for 4 days.   HYDROcodone-acetaminophen (NORCO/VICODIN) 5-325 MG tablet Take 1 tablet by mouth every 12 (twelve) hours as needed for severe pain.   irbesartan (AVAPRO) 300 MG tablet TAKE 1 TABLET(300 MG) BY MOUTH DAILY   methocarbamol (ROBAXIN) 500 MG tablet TAKE 1 TABLET(500 MG) BY MOUTH DAILY   nitroGLYCERIN (NITROSTAT) 0.4 MG SL tablet Place 1 tablet  (0.4 mg total) under the tongue every 5 (five) minutes x 3 doses as needed for chest pain.   nystatin cream (MYCOSTATIN) Apply 1 application  topically 2 (two) times daily.   pravastatin (PRAVACHOL) 40 MG tablet TAKE 1 TABLET(40 MG) BY MOUTH DAILY   triamcinolone cream (KENALOG) 0.1 % Apply 1 Application topically 2 (two) times daily. Affected area on right foot   No facility-administered encounter medications on file as of 03/08/2023.    Review of Systems:  Review of Systems  Constitutional:  Negative for appetite change, chills, fatigue and fever.  HENT:  Negative for congestion, ear pain, hearing loss, rhinorrhea and sore throat.   Eyes: Negative.   Respiratory:  Negative for cough, shortness of breath and wheezing.   Cardiovascular:  Negative for chest pain, palpitations and leg swelling.  Gastrointestinal:  Negative for abdominal pain, constipation, diarrhea, nausea and vomiting.  Genitourinary:  Negative for dysuria.  Musculoskeletal:  Negative for arthralgias, back pain and myalgias.  Skin:  Negative for color change, rash and wound.  Neurological:  Negative for dizziness, weakness and headaches.  Psychiatric/Behavioral:  Negative for behavioral problems. The patient is not nervous/anxious.     Health Maintenance  Topic Date Due   COVID-19 Vaccine (1) Never done   Zoster Vaccines- Shingrix (2 of  2) 11/12/2020   INFLUENZA VACCINE  04/18/2023   Medicare Annual Wellness (AWV)  11/30/2023   DTaP/Tdap/Td (3 - Td or Tdap) 03/04/2033   Pneumonia Vaccine 59+ Years old  Completed   DEXA SCAN  Completed   HPV VACCINES  Aged Out    Physical Exam: Vitals:   03/08/23 0811  BP: 120/80  Pulse: 60  Resp: 18  Temp: 97.8 F (36.6 C)  SpO2: 95%  Height: 5\' 10"  (1.778 m)   Body mass index is 25.63 kg/m. Physical Exam Constitutional:      Appearance: Normal appearance.  HENT:     Head: Normocephalic and atraumatic.     Left Ear: There is impacted cerumen.     Nose: Nose normal.      Mouth/Throat:     Mouth: Mucous membranes are moist.  Eyes:     Conjunctiva/sclera: Conjunctivae normal.  Cardiovascular:     Rate and Rhythm: Normal rate and regular rhythm.  Pulmonary:     Effort: Pulmonary effort is normal.     Breath sounds: Normal breath sounds.  Abdominal:     General: Bowel sounds are normal.     Palpations: Abdomen is soft.  Musculoskeletal:     Cervical back: Normal range of motion.     Comments: Has arthritic deformities on all fingers  Skin:    General: Skin is warm and dry.  Neurological:     Mental Status: She is alert. Mental status is at baseline.  Psychiatric:        Mood and Affect: Mood normal.        Behavior: Behavior normal.        Thought Content: Thought content normal.        Judgment: Judgment normal.     Labs reviewed: Basic Metabolic Panel: Recent Labs    09/03/22 0000  NA 132*  K 5.4*  CL 95*  CO2 29  GLUCOSE 111*  BUN 11  CREATININE 0.82  CALCIUM 10.0   Liver Function Tests: Recent Labs    09/03/22 0000  AST 14  ALT 9  BILITOT 0.8  PROT 6.8   No results for input(s): "LIPASE", "AMYLASE" in the last 8760 hours. No results for input(s): "AMMONIA" in the last 8760 hours. CBC: Recent Labs    09/03/22 0000  WBC 5.9  NEUTROABS 3,463  HGB 13.7  HCT 39.9  MCV 95.7  PLT 290   Lipid Panel: Recent Labs    09/03/22 0000  CHOL 209*  HDL 111  LDLCALC 87  TRIG 40  CHOLHDL 1.9   Lab Results  Component Value Date   HGBA1C 5.3 03/08/2021    Procedures since last visit: No results found.  Assessment/Plan  1. Impacted cerumen of left ear -  ear lavage done, obtained moderate amount of cerumen  2. Essential hypertension -  BP 120/80, stable -  continue Amlodipine and Irbesartan  3. Late onset Alzheimer's disease without behavioral disturbance (HCC) -  continue supportive care at home -  fall precautions   Labs/tests ordered:  None  Next appt:  09/04/2023

## 2023-03-08 NOTE — Progress Notes (Signed)
CBC stable, no anemia

## 2023-03-09 LAB — LIPID PANEL
Cholesterol: 176 mg/dL (ref <200)
HDL: 92 mg/dL (ref >=50)
LDL Cholesterol (Calc): 71 mg/dL (calc)
Non-HDL Cholesterol (Calc): 84 mg/dL (calc) (ref <130)
Total CHOL/HDL Ratio: 1.9 (calc) (ref <5.0)
Triglycerides: 45 mg/dL (ref <150)

## 2023-03-09 LAB — CBC WITH DIFFERENTIAL/PLATELET
Absolute Monocytes: 459 cells/uL (ref 200–950)
Basophils Absolute: 31 cells/uL (ref 0–200)
Basophils Relative: 0.6 %
Eosinophils Absolute: 102 cells/uL (ref 15–500)
Eosinophils Relative: 2 %
HCT: 35.6 % (ref 35.0–45.0)
Hemoglobin: 12.1 g/dL (ref 11.7–15.5)
Lymphs Abs: 1805 cells/uL (ref 850–3900)
MCH: 32.3 pg (ref 27.0–33.0)
MCHC: 34 g/dL (ref 32.0–36.0)
MCV: 94.9 fL (ref 80.0–100.0)
MPV: 10.1 fL (ref 7.5–12.5)
Monocytes Relative: 9 %
Neutro Abs: 2703 cells/uL (ref 1500–7800)
Neutrophils Relative %: 53 %
Platelets: 276 10*3/uL (ref 140–400)
RBC: 3.75 10*6/uL — ABNORMAL LOW (ref 3.80–5.10)
RDW: 11.7 % (ref 11.0–15.0)
Total Lymphocyte: 35.4 %
WBC: 5.1 10*3/uL (ref 3.8–10.8)

## 2023-03-09 LAB — TSH: TSH: 1.55 mIU/L (ref 0.40–4.50)

## 2023-03-09 LAB — COMPLETE METABOLIC PANEL WITH GFR
AG Ratio: 1.5 (calc) (ref 1.0–2.5)
ALT: 9 U/L (ref 6–29)
AST: 12 U/L (ref 10–35)
Albumin: 3.7 g/dL (ref 3.6–5.1)
Alkaline phosphatase (APISO): 58 U/L (ref 37–153)
BUN: 12 mg/dL (ref 7–25)
CO2: 27 mmol/L (ref 20–32)
Calcium: 9.2 mg/dL (ref 8.6–10.4)
Chloride: 97 mmol/L — ABNORMAL LOW (ref 98–110)
Creat: 0.75 mg/dL (ref 0.60–0.95)
Globulin: 2.4 g/dL (calc) (ref 1.9–3.7)
Glucose, Bld: 91 mg/dL (ref 65–99)
Potassium: 5 mmol/L (ref 3.5–5.3)
Sodium: 133 mmol/L — ABNORMAL LOW (ref 135–146)
Total Bilirubin: 0.5 mg/dL (ref 0.2–1.2)
Total Protein: 6.1 g/dL (ref 6.1–8.1)
eGFR: 78 mL/min/{1.73_m2} (ref >=60)

## 2023-03-10 NOTE — Progress Notes (Signed)
  Subjective:  Patient ID: Amy Andrade, female    DOB: 11-20-1935,  MRN: [id number]  Amy Andrade presents to clinic today for painful porokeratotic lesion(s) right foot and painful mycotic toenails that limit ambulation. Painful toenails interfere with ambulation. Aggravating factors include wearing enclosed shoe gear. Pain is relieved with periodic professional debridement. Painful porokeratotic lesions are aggravated when weightbearing with and without shoegear. Pain is relieved with periodic professional debridement.  Chief Complaint  Patient presents with   NAIL CARE    RFC   New problem(s): None.   PCP is Ngetich, Donalee Citrin, NP.  No Known Allergies  Review of Systems: Negative except as noted in the HPI.  Objective:  There were no vitals filed for this visit. Amy Andrade is a pleasant 87 y.o. female WD, WN in NAD. AAO x 3.  Vascular:  Capillary refill time to digits immediate b/l. Palpable pedal pulses b/l LE. Pedal hair sparse. Lower extremity skin temperature gradient within normal limits. No pain with calf compression b/l. Nonpitting edema noted b/l lower extremities.  Dermatological:  Pedal skin with normal turgor, texture and tone b/l lower extremities No open wounds b/l lower extremities No interdigital macerations b/l lower extremities   Toenails 1-5 b/l elongated, discolored, dystrophic, thickened, crumbly with subungual debris and tenderness to dorsal palpation.   Porokeratotic lesion(s) distal tip of right 3rd toe with tenderness to palpation. No erythema, no edema, no drainage, no fluctuance.  Musculoskeletal:  Normal muscle strength 5/5 to all lower extremity muscle groups bilaterally. No pain crepitus or joint limitation noted with ROM b/l. Hammertoe(s) noted to the 2-5 bilaterally.  Neurological:  Protective sensation intact 5/5 intact bilaterally with 10g monofilament b/l. Vibratory sensation intact b/l. Assessment/Plan: 1. Pain due to onychomycosis  of toenails of both feet   2. Porokeratosis   3. Pain in left toe(s)     -Consent given for treatment as described below: -Examined patient. -Continue supportive shoe gear daily. -Toenails 1-5 b/l were debrided in length and girth with sterile nail nippers and dremel without iatrogenic bleeding.  -Porokeratotic lesion(s) right third digit pared and enucleated with sterile currette without incident. Total number of lesions debrided=1. -Patient/POA to call should there be question/concern in the interim.   Return in about 3 months (around 06/05/2023).  Freddie Breech, DPM

## 2023-04-29 ENCOUNTER — Other Ambulatory Visit: Payer: Self-pay | Admitting: *Deleted

## 2023-04-29 DIAGNOSIS — I10 Essential (primary) hypertension: Secondary | ICD-10-CM

## 2023-04-29 DIAGNOSIS — E785 Hyperlipidemia, unspecified: Secondary | ICD-10-CM

## 2023-04-29 MED ORDER — PRAVASTATIN SODIUM 40 MG PO TABS: ORAL_TABLET | ORAL | 1 refills | Status: DC

## 2023-04-29 MED ORDER — IRBESARTAN 300 MG PO TABS: 300.0000 mg | ORAL_TABLET | Freq: Every day | ORAL | 1 refills | Status: DC

## 2023-04-29 MED ORDER — AMLODIPINE BESYLATE 10 MG PO TABS: ORAL_TABLET | ORAL | 1 refills | Status: DC

## 2023-04-29 NOTE — Telephone Encounter (Signed)
Amy Andrade, son, requested refills. Stated that they have changed pharmacies to [facility].

## 2023-05-13 ENCOUNTER — Other Ambulatory Visit: Payer: Self-pay | Admitting: Family

## 2023-05-13 DIAGNOSIS — E785 Hyperlipidemia, unspecified: Secondary | ICD-10-CM

## 2023-06-07 DIAGNOSIS — M24412 Recurrent dislocation, left shoulder: Secondary | ICD-10-CM | POA: Diagnosis not present

## 2023-06-07 DIAGNOSIS — M25512 Pain in left shoulder: Secondary | ICD-10-CM | POA: Diagnosis not present

## 2023-06-07 DIAGNOSIS — G8929 Other chronic pain: Secondary | ICD-10-CM | POA: Diagnosis not present

## 2023-06-19 ENCOUNTER — Encounter: Payer: Self-pay | Admitting: Podiatry

## 2023-06-19 ENCOUNTER — Ambulatory Visit (INDEPENDENT_AMBULATORY_CARE_PROVIDER_SITE_OTHER): Payer: Medicare Other | Admitting: Podiatry

## 2023-06-19 DIAGNOSIS — B351 Tinea unguium: Secondary | ICD-10-CM | POA: Diagnosis not present

## 2023-06-19 DIAGNOSIS — M79675 Pain in left toe(s): Secondary | ICD-10-CM | POA: Diagnosis not present

## 2023-06-19 DIAGNOSIS — M79674 Pain in right toe(s): Secondary | ICD-10-CM | POA: Diagnosis not present

## 2023-06-23 NOTE — Progress Notes (Signed)
  Subjective:  Patient ID: Amy Andrade, female    DOB: 1936/03/26,  MRN: [id number]  87 y.o. female presents painful porokeratotic lesion(s) R 3rd toe and painful mycotic toenails that limit ambulation. Painful toenails interfere with ambulation. Aggravating factors include wearing enclosed shoe gear. Pain is relieved with periodic professional debridement. Painful porokeratotic lesions are aggravated when weightbearing with and without shoegear. Pain is relieved with periodic professional debridement. Her son is present during today's visit. Chief Complaint  Patient presents with   RFC    RFC    New problem(s): None   PCP is Ngetich, Dinah C, NP.  No Known Allergies  Review of Systems: Negative except as noted in the HPI.   Objective:  Amy Andrade is a pleasant 87 y.o. female in NAD.Marland Kitchen AAO x 3.  Vascular Examination: Vascular status intact b/l with palpable pedal pulses. CFT immediate b/l. Pedal hair decreased. Trace edema b/l feet.  No pain with calf compression b/l. Skin temperature gradient WNL b/l. No varicosities noted. No cyanosis or clubbing noted.  Neurological Examination: Sensation grossly intact b/l with 10 gram monofilament. Vibratory sensation intact b/l.  Dermatological Examination: Pedal skin with normal turgor, texture and tone b/l. No open wounds nor interdigital macerations noted. Toenails 1-5 b/l thick, discolored, elongated with subungual debris and pain on dorsal palpation.   Resolved porokeratotic lesion right 3rd toe.  Musculoskeletal Examination: Muscle strength 5/5 to b/l LE.  No pain, crepitus noted b/l. Hammertoe deformity noted 2-5 b/l.  Radiographs: None  Last A1c:       No data to display          Assessment:   1. Pain due to onychomycosis of toenails of both feet    Plan:  -Patient's family member present. All questions/concerns addressed on today's visit. -Patient to continue soft, supportive shoe gear daily. -Mycotic  toenails 1-5 bilaterally were debrided in length and girth with sterile nail nippers and dremel without incident. -Patient/POA to call should there be question/concern in the interim.  Return in about 3 months (around 09/19/2023).  Freddie Breech, DPM

## 2023-07-29 ENCOUNTER — Other Ambulatory Visit: Payer: Self-pay | Admitting: *Deleted

## 2023-07-29 DIAGNOSIS — G8929 Other chronic pain: Secondary | ICD-10-CM

## 2023-07-29 MED ORDER — METHOCARBAMOL 500 MG PO TABS: 500.0000 mg | ORAL_TABLET | Freq: Every day | ORAL | 1 refills | Status: DC

## 2023-07-29 NOTE — Telephone Encounter (Signed)
Patient son requested refill to be sent to [facility].

## 2023-08-22 ENCOUNTER — Ambulatory Visit: Payer: Medicare Other | Admitting: Family

## 2023-08-22 ENCOUNTER — Encounter: Payer: Self-pay | Admitting: Family

## 2023-08-22 VITALS — BP 128/64 | HR 68 | Temp 97.8°F | Resp 20 | Ht 70.0 in

## 2023-08-22 DIAGNOSIS — E782 Mixed hyperlipidemia: Secondary | ICD-10-CM

## 2023-08-22 DIAGNOSIS — F0280 Dementia in other diseases classified elsewhere without behavioral disturbance: Secondary | ICD-10-CM

## 2023-08-22 DIAGNOSIS — R300 Dysuria: Secondary | ICD-10-CM

## 2023-08-22 DIAGNOSIS — G301 Alzheimer's disease with late onset: Secondary | ICD-10-CM | POA: Diagnosis not present

## 2023-08-22 DIAGNOSIS — I10 Essential (primary) hypertension: Secondary | ICD-10-CM | POA: Diagnosis not present

## 2023-08-22 LAB — POCT URINALYSIS DIPSTICK
Bilirubin, UA: NEGATIVE
Glucose, UA: NEGATIVE
Ketones, UA: NEGATIVE
Nitrite, UA: NEGATIVE
Protein, UA: NEGATIVE
Spec Grav, UA: 1.01 (ref 1.010–1.025)
Urobilinogen, UA: 0.2 U/dL
pH, UA: 6.5 (ref 5.0–8.0)

## 2023-08-22 MED ORDER — CIPROFLOXACIN HCL 500 MG PO TABS: 500.0000 mg | ORAL_TABLET | Freq: Two times a day (BID) | ORAL | 0 refills | Status: AC

## 2023-08-22 NOTE — Progress Notes (Signed)
Provider: Richarda Blade FNP-C  Brittyn Salaz, Donalee Citrin, NP  Patient Care Team: Irlene Crudup, Donalee Citrin, NP as PCP - General (Family Medicine) Chalmers Guest, MD as Consulting Physician (Ophthalmology)  Extended Emergency Contact Information Primary Emergency Contact: Paola,[name] Address: [address] [place], [place] [address] [place] of [place] Home Phone: [phone] Relation: Other Secondary Emergency Contact: Linz,[name] Address: [address]          [place], [place] [address] [place] of [place] Home Phone: [phone] Relation: Son  Code Status:  Full Code  Goals of care: Advanced Directive information    03/08/2023    8:05 AM  Advanced Directives  Does Patient Have a Medical Advance Directive? Yes  Type of Advance Directive Healthcare Power of Attorney  Does patient want to make changes to medical advance directive? No - Patient declined  Copy of Healthcare Power of Attorney in Chart? Yes - validated most recent copy scanned in chart (See row information)     Chief Complaint  Patient presents with   Acute Visit    Patient presents today for a possible UTI 6 months follow up    Medical Management of Chronic Issues    Patient presents today for a 6 month follow-up    HPI:  Pt is a 87 y.o. female seen today for an acute visit for 6 months follow up.she complains of burning with urination. Son has noticed increased confusion. She denies any fever,chills,nausea,vomiting,abdominal pain,flank pain,urgency,frequency,difficult urination or hematuria.     Past Medical History:  Diagnosis Date   Alzheimer disease (HCC)    Asthma    Cancer (HCC)    Hyperlipidemia    Hypertension    Pulmonary embolism (HCC) 2004   was short of breath--was on coumadin for 6 mos to a year   Past Surgical History:  Procedure Laterality Date   ABDOMINAL HYSTERECTOMY     BUNIONECTOMY     CATARACT EXTRACTION Bilateral 06/17/2022   REPLACEMENT TOTAL KNEE   1980s, 2003   x2    No Known Allergies  Outpatient Encounter Medications as of 08/22/2023  Medication Sig   amLODipine (NORVASC) 10 MG tablet TAKE 1 TABLET(10 MG) BY MOUTH DAILY   aspirin 81 MG tablet Take 81 mg by mouth daily.   CALCIUM-VITAMIN D PO Take 1 tablet by mouth 2 (two) times daily.   ciprofloxacin (CIPRO) 500 MG tablet Take 1 tablet (500 mg total) by mouth 2 (two) times daily for 7 days.   HYDROcodone-acetaminophen (NORCO/VICODIN) 5-325 MG tablet Take 1 tablet by mouth every 12 (twelve) hours as needed for severe pain.   irbesartan (AVAPRO) 300 MG tablet Take 1 tablet (300 mg total) by mouth daily.   methocarbamol (ROBAXIN) 500 MG tablet Take 1 tablet (500 mg total) by mouth daily.   nitroGLYCERIN (NITROSTAT) 0.4 MG SL tablet Place 1 tablet (0.4 mg total) under the tongue every 5 (five) minutes x 3 doses as needed for chest pain.   nystatin cream (MYCOSTATIN) Apply 1 application  topically 2 (two) times daily.   pravastatin (PRAVACHOL) 40 MG tablet TAKE 1 TABLET(40 MG) BY MOUTH DAILY   triamcinolone cream (KENALOG) 0.1 % Apply 1 Application topically 2 (two) times daily. Affected area on right foot   No facility-administered encounter medications on file as of 08/22/2023.    Review of Systems  Unable to perform ROS: Dementia (additional HPI provided by son)  Constitutional:  Negative for appetite change, chills, fatigue, fever and unexpected weight change.  HENT:  Negative for congestion, dental problem, ear discharge, ear pain, hearing loss, nosebleeds, postnasal drip, rhinorrhea, sinus pressure, sinus pain, sneezing, sore throat, tinnitus and trouble swallowing.   Eyes:  Negative for pain, discharge, redness, itching and visual disturbance.  Respiratory:  Negative for cough, chest tightness, shortness of breath and wheezing.   Cardiovascular:  Negative for chest pain, palpitations and leg swelling.  Gastrointestinal:  Negative for abdominal distention, abdominal pain, blood  in stool, constipation, diarrhea, nausea and vomiting.  Endocrine: Negative for cold intolerance, heat intolerance, polydipsia, polyphagia and polyuria.  Genitourinary:  Positive for dysuria. Negative for difficulty urinating, flank pain, frequency and urgency.  Musculoskeletal:  Positive for arthralgias and gait problem. Negative for back pain, joint swelling, myalgias, neck pain and neck stiffness.       Shoulder pain Norco effective   Skin:  Negative for color change, pallor, rash and wound.  Neurological:  Negative for dizziness, syncope, speech difficulty, weakness, light-headedness, numbness and headaches.  Hematological:  Does not bruise/bleed easily.  Psychiatric/Behavioral:  Negative for agitation, behavioral problems, confusion, hallucinations and sleep disturbance. The patient is not nervous/anxious.     Immunization History  Administered Date(s) Administered   Fluad Quad(high Dose 65+) 08/30/2021, 09/03/2022   Influenza, High Dose Seasonal PF 10/27/2018, 07/18/2020   Influenza,inj,Quad PF,6+ Mos 05/27/2015, 06/18/2016   Influenza-Unspecified 05/18/2017   Pneumococcal Conjugate-13 10/11/2014   Pneumococcal Polysaccharide-23 01/20/2016   Pneumococcal-Unspecified 09/17/2020   Tdap 09/17/2010, 03/05/2023   Zoster Recombinant(Shingrix) 09/17/2020   Zoster, Live 07/22/2014   Pertinent  Health Maintenance Due  Topic Date Due   INFLUENZA VACCINE  04/18/2023   DEXA SCAN  Completed      09/03/2022   11:24 AM 10/31/2022   10:46 AM 11/30/2022    1:29 PM 03/05/2023    8:31 AM 03/08/2023    8:27 AM  Fall Risk  Falls in the past year? 0 0 0 0 0  Was there an injury with Fall? 0 0 0 0 0  Fall Risk Category Calculator 0 0 0 0 0  Fall Risk Category (Retired) Low      (RETIRED) Patient Fall Risk Level Low fall risk      Patient at Risk for Falls Due to No Fall Risks No Fall Risks No Fall Risks No Fall Risks No Fall Risks  Fall risk Follow up Falls evaluation completed Falls evaluation  completed Falls evaluation completed;Education provided;Falls prevention discussed Falls evaluation completed Falls evaluation completed   Functional Status Survey:    Vitals:   08/22/23 1113  BP: 128/64  Pulse: 68  Resp: 20  Temp: 97.8 F (36.6 C)  SpO2: 97%  Height: 5\' 10"  (1.778 m)   Body mass index is 25.63 kg/m. Physical Exam Vitals reviewed.  Constitutional:      General: She is not in acute distress.    Appearance: Normal appearance. She is overweight. She is not ill-appearing or diaphoretic.  HENT:     Head: Normocephalic.     Right Ear: Tympanic membrane, ear canal and external ear normal. There is no impacted cerumen.     Left Ear: Tympanic membrane, ear canal and external ear normal. There is no impacted cerumen.     Nose: Nose normal. No congestion or rhinorrhea.     Mouth/Throat:     Mouth: Mucous membranes are moist.     Pharynx: Oropharynx is clear. No oropharyngeal exudate or posterior oropharyngeal erythema.  Eyes:     General: No scleral icterus.       Right eye: No discharge.        Left eye: No discharge.     Extraocular Movements: Extraocular movements intact.     Conjunctiva/sclera: Conjunctivae normal.     Pupils: Pupils are equal, round, and reactive to light.  Neck:     Vascular: No carotid bruit.  Cardiovascular:     Rate and Rhythm: Normal rate and regular rhythm.     Pulses: Normal pulses.     Heart sounds: Normal heart sounds. No murmur heard.    No friction rub. No gallop.  Pulmonary:     Effort: Pulmonary effort is normal. No respiratory distress.     Breath sounds: Normal breath sounds. No wheezing, rhonchi or rales.  Chest:     Chest wall: No tenderness.  Abdominal:     General: Bowel sounds are normal. There is no distension.     Palpations: Abdomen is soft. There is no mass.     Tenderness: There is no abdominal tenderness. There is no right CVA tenderness, left CVA tenderness, guarding or rebound.  Musculoskeletal:         General: No swelling or tenderness. Normal range of motion.     Cervical back: Normal range of motion. No rigidity or tenderness.     Right lower leg: No edema.     Left lower leg: No edema.  Lymphadenopathy:     Cervical: No cervical adenopathy.  Skin:    General: Skin is warm and dry.     Coloration: Skin is not pale.     Findings: No bruising, erythema, lesion or rash.  Neurological:     Mental Status: She is alert and oriented to person, place, and time.     Cranial Nerves: No cranial nerve deficit.     Sensory: No sensory deficit.     Motor: No weakness.     Coordination: Coordination normal.     Gait: Gait abnormal.  Psychiatric:        Mood and Affect: Mood normal.        Speech: Speech normal.        Behavior: Behavior normal.        Thought Content: Thought content normal.        Cognition and Memory: She exhibits impaired recent memory.        Judgment: Judgment normal.     Labs reviewed: Recent Labs    09/03/22 0000 03/08/23 0821  NA 132* 133*  K 5.4* 5.0  CL 95* 97*  CO2 29 27  GLUCOSE 111* 91  BUN 11 12  CREATININE 0.82 0.75  CALCIUM 10.0 9.2   Recent Labs    09/03/22 0000 03/08/23 0821  AST 14 12  ALT 9 9  BILITOT 0.8 0.5  PROT 6.8 6.1   Recent Labs    09/03/22 0000 03/08/23 0821  WBC 5.9 5.1  NEUTROABS 3,463 2,703  HGB 13.7 12.1  HCT 39.9 35.6  MCV 95.7 94.9  PLT 290 276   Lab Results  Component Value Date   TSH 1.55 03/08/2023   Lab Results  Component Value Date   HGBA1C 5.3 03/08/2021   Lab Results  Component Value Date   CHOL 176 03/08/2023   HDL 92 03/08/2023   LDLCALC 71 03/08/2023   TRIG 45 03/08/2023   CHOLHDL 1.9 03/08/2023    Significant Diagnostic Results in last 30 days:  No results found.  Assessment/Plan  1. Dysuria Afebrile  - negative exam findings  - POCT urinalysis dipstick shows yellow cloudy urine moderate blood and large leukocytes.would like to start on antibiotics. Son aware might need to switch  antibiotics depending on culture and sensitivity.  - Urine Culture - ciprofloxacin (CIPRO) 500 MG tablet; Take 1 tablet (500 mg total) by mouth 2 (two) times daily for 7 days.  Dispense: 14 tablet; Refill: 0  2. Essential hypertension B/p well controlled  - continue on irbesartan and amlodipine  - continue dietary modification and exercise as tolerated. - COMPLETE METABOLIC PANEL WITH GFR; Future - CBC with Differential/Platelet; Future - TSH; Future  3. Mixed hyperlipidemia Previous LDL at goal  - continue on pravastatin  - Lipid panel; Future  4. Late onset Alzheimer's disease without behavioral disturbance (HCC) Increased confusion recently noted by son though possible due to UTI as above. - continue with supportive care   Family/ staff Communication: Reviewed plan of care with patient verbalized understanding   Labs/tests ordered:  - POCT urinalysis dipstick - Urine Culture - Lipid panel; Future - COMPLETE METABOLIC PANEL WITH GFR; Future - CBC with Differential/Platelet; Future - TSH; Future  Next Appointment: Return in about 6 months (around 02/20/2024) for medical mangement of chronic issues., Fasting labs in 6 months prior to visit.   Caesar Bookman, NP

## 2023-08-24 LAB — URINE CULTURE
MICRO NUMBER:: 15814308
SPECIMEN QUALITY:: ADEQUATE

## 2023-09-04 ENCOUNTER — Telehealth: Payer: Medicare Other

## 2023-09-04 ENCOUNTER — Ambulatory Visit: Payer: Medicare Other | Admitting: Family

## 2023-09-04 ENCOUNTER — Other Ambulatory Visit: Payer: Self-pay

## 2023-09-04 DIAGNOSIS — S72402S Unspecified fracture of lower end of left femur, sequela: Secondary | ICD-10-CM

## 2023-09-04 DIAGNOSIS — G8929 Other chronic pain: Secondary | ICD-10-CM

## 2023-09-04 MED ORDER — HYDROCODONE-ACETAMINOPHEN 5-325 MG PO TABS: 1.0000 | ORAL_TABLET | Freq: Two times a day (BID) | ORAL | 0 refills | Status: DC | PRN

## 2023-09-04 NOTE — Telephone Encounter (Signed)
Patients son is aware rx sent

## 2023-09-04 NOTE — Telephone Encounter (Signed)
Patient is requesting a refill of the following medications: Requested Prescriptions   Pending Prescriptions Disp Refills   HYDROcodone-acetaminophen (NORCO/VICODIN) 5-325 MG tablet 30 tablet 0    Sig: Take 1 tablet by mouth every 12 (twelve) hours as needed for severe pain (pain score 7-10).    Date of last refill: 05/14/2022  Refill amount: 30  Treatment agreement date: 08/22/23

## 2023-09-04 NOTE — Telephone Encounter (Signed)
Patient's son left a voicemail stating that Ngetich, Donalee Citrin, NP prescribe HYDROcodone-acetaminophen (NORCO/VICODIN) 5-325 that was sent to the wrong pharmacy and would like to resent to the [facility] on [address]. RX is pending to be sign by Ngetich, Donalee Citrin, NP   Patient is requesting a refill of the following medications: Requested Prescriptions   Pending Prescriptions Disp Refills   HYDROcodone-acetaminophen (NORCO/VICODIN) 5-325 MG tablet 30 tablet 0    Sig: Take 1 tablet by mouth every 12 (twelve) hours as needed for severe pain (pain score 7-10).    Date of last refill: 05/14/2022  Refill amount: 30  Treatment agreement date: 08/22/23  Message sent to Ngetich, Donalee Citrin, NP

## 2023-09-06 ENCOUNTER — Ambulatory Visit: Payer: Medicare Other | Admitting: Family

## 2023-09-06 DIAGNOSIS — M24412 Recurrent dislocation, left shoulder: Secondary | ICD-10-CM | POA: Diagnosis not present

## 2023-09-06 DIAGNOSIS — G8929 Other chronic pain: Secondary | ICD-10-CM | POA: Diagnosis not present

## 2023-09-06 DIAGNOSIS — M25512 Pain in left shoulder: Secondary | ICD-10-CM | POA: Diagnosis not present

## 2023-09-12 ENCOUNTER — Telehealth: Payer: Self-pay

## 2023-09-12 NOTE — Telephone Encounter (Signed)
Incoming fax received from patients pharmacy to initiate a prior authorization for                   .  PA initiated through covermymeds. Key: [id number]  Awaiting reply from the insurance company which will be determined in 48-72 hours.

## 2023-10-02 ENCOUNTER — Ambulatory Visit (INDEPENDENT_AMBULATORY_CARE_PROVIDER_SITE_OTHER): Payer: Medicare Other | Admitting: Podiatry

## 2023-10-02 DIAGNOSIS — Z91198 Patient's noncompliance with other medical treatment and regimen for other reason: Secondary | ICD-10-CM

## 2023-10-04 NOTE — Progress Notes (Signed)
1. Failure to attend appointment with reason given    Rescheduled.

## 2023-10-07 ENCOUNTER — Ambulatory Visit: Payer: Medicare Other | Admitting: Podiatrist

## 2023-10-22 ENCOUNTER — Other Ambulatory Visit: Payer: Self-pay | Admitting: Family

## 2023-10-22 DIAGNOSIS — I10 Essential (primary) hypertension: Secondary | ICD-10-CM

## 2023-11-20 ENCOUNTER — Other Ambulatory Visit: Payer: Self-pay | Admitting: Family

## 2023-11-20 DIAGNOSIS — E785 Hyperlipidemia, unspecified: Secondary | ICD-10-CM

## 2023-12-02 ENCOUNTER — Encounter: Payer: Medicare Other | Admitting: Family

## 2023-12-06 ENCOUNTER — Encounter: Payer: Self-pay | Admitting: Family

## 2023-12-06 ENCOUNTER — Telehealth: Admitting: Family

## 2023-12-06 DIAGNOSIS — M24412 Recurrent dislocation, left shoulder: Secondary | ICD-10-CM | POA: Diagnosis not present

## 2023-12-06 DIAGNOSIS — G8929 Other chronic pain: Secondary | ICD-10-CM | POA: Diagnosis not present

## 2023-12-06 DIAGNOSIS — Z0000 Encounter for general adult medical examination without abnormal findings: Secondary | ICD-10-CM

## 2023-12-06 NOTE — Patient Instructions (Addendum)
 [name] , Thank you for taking time to come for your Medicare Wellness Visit. I appreciate your ongoing commitment to your health goals. Please review the following plan we discussed and let me know if I can assist you in the future.   Screening recommendations/referrals: Colonoscopy : N/A  Mammogram : Up to date  Bone Density  : Up to date  Recommended yearly ophthalmology/optometry visit for glaucoma screening and checkup Recommended yearly dental visit for hygiene and checkup  Vaccinations: Influenza vaccine- due annually in September/October Pneumococcal vaccine  : Up to date  Tdap vaccine  : Up to date  Shingles vaccine : Due     Advanced directives: yes   Conditions/risks identified: advanced age (>82men, >63 women);hypertension;sedentary lifestyle;family history of premature cardiovascular disease;smoking/ tobacco exposure  Next appointment: 1 year    Preventive Care 55 Years and Older, Female Preventive care refers to lifestyle choices and visits with your health care provider that can promote health and wellness. What does preventive care include? A yearly physical exam. This is also called an annual well check. Dental exams once or twice a year. Routine eye exams. Ask your health care provider how often you should have your eyes checked. Personal lifestyle choices, including: Daily care of your teeth and gums. Regular physical activity. Eating a healthy diet. Avoiding tobacco and drug use. Limiting alcohol use. Practicing safe sex. Taking low-dose aspirin every day. Taking vitamin and mineral supplements as recommended by your health care provider. What happens during an annual well check? The services and screenings done by your health care provider during your annual well check will depend on your age, overall health, lifestyle risk factors, and family history of disease. Counseling  Your health care provider may ask you questions about your: Alcohol  use. Tobacco use. Drug use. Emotional well-being. Home and relationship well-being. Sexual activity. Eating habits. History of falls. Memory and ability to understand (cognition). Work and work Astronomer. Reproductive health. Screening  You may have the following tests or measurements: Height, weight, and BMI. Blood pressure. Lipid and cholesterol levels. These may be checked every 5 years, or more frequently if you are over 45 years old. Skin check. Lung cancer screening. You may have this screening every year starting at age 100 if you have a 30-pack-year history of smoking and currently smoke or have quit within the past 15 years. Fecal occult blood test (FOBT) of the stool. You may have this test every year starting at age 28. Flexible sigmoidoscopy or colonoscopy. You may have a sigmoidoscopy every 5 years or a colonoscopy every 10 years starting at age 2. Hepatitis C blood test. Hepatitis B blood test. Sexually transmitted disease (STD) testing. Diabetes screening. This is done by checking your blood sugar (glucose) after you have not eaten for a while (fasting). You may have this done every 1-3 years. Bone density scan. This is done to screen for osteoporosis. You may have this done starting at age 77. Mammogram. This may be done every 1-2 years. Talk to your health care provider about how often you should have regular mammograms. Talk with your health care provider about your test results, treatment options, and if necessary, the need for more tests. Vaccines  Your health care provider may recommend certain vaccines, such as: Influenza vaccine. This is recommended every year. Tetanus, diphtheria, and acellular pertussis (Tdap, Td) vaccine. You may need a Td booster every 10 years. Zoster vaccine. You may need this after age 68. Pneumococcal 13-valent conjugate (PCV13) vaccine.  One dose is recommended after age 30. Pneumococcal polysaccharide (PPSV23) vaccine. One dose is  recommended after age 10. Talk to your health care provider about which screenings and vaccines you need and how often you need them. This information is not intended to replace advice given to you by your health care provider. Make sure you discuss any questions you have with your health care provider. Document Released: 09/30/2015 Document Revised: 05/23/2016 Document Reviewed: 07/05/2015 Elsevier Interactive Patient Education  2017 [employer].  Fall Prevention in the Home Falls can cause injuries. They can happen to people of all ages. There are many things you can do to make your home safe and to help prevent falls. What can I do on the outside of my home? Regularly fix the edges of walkways and driveways and fix any cracks. Remove anything that might make you trip as you walk through a door, such as a raised step or threshold. Trim any bushes or trees on the path to your home. Use bright outdoor lighting. Clear any walking paths of anything that might make someone trip, such as rocks or tools. Regularly check to see if handrails are loose or broken. Make sure that both sides of any steps have handrails. Any raised decks and porches should have guardrails on the edges. Have any leaves, snow, or ice cleared regularly. Use sand or salt on walking paths during winter. Clean up any spills in your garage right away. This includes oil or grease spills. What can I do in the bathroom? Use night lights. Install grab bars by the toilet and in the tub and shower. Do not use towel bars as grab bars. Use non-skid mats or decals in the tub or shower. If you need to sit down in the shower, use a plastic, non-slip stool. Keep the floor dry. Clean up any water that spills on the floor as soon as it happens. Remove soap buildup in the tub or shower regularly. Attach bath mats securely with double-sided non-slip rug tape. Do not have throw rugs and other things on the floor that can make you  trip. What can I do in the bedroom? Use night lights. Make sure that you have a light by your bed that is easy to reach. Do not use any sheets or blankets that are too big for your bed. They should not hang down onto the floor. Have a firm chair that has side arms. You can use this for support while you get dressed. Do not have throw rugs and other things on the floor that can make you trip. What can I do in the kitchen? Clean up any spills right away. Avoid walking on wet floors. Keep items that you use a lot in easy-to-reach places. If you need to reach something above you, use a strong step stool that has a grab bar. Keep electrical cords out of the way. Do not use floor polish or wax that makes floors slippery. If you must use wax, use non-skid floor wax. Do not have throw rugs and other things on the floor that can make you trip. What can I do with my stairs? Do not leave any items on the stairs. Make sure that there are handrails on both sides of the stairs and use them. Fix handrails that are broken or loose. Make sure that handrails are as long as the stairways. Check any carpeting to make sure that it is firmly attached to the stairs. Fix any carpet that is loose or  worn. Avoid having throw rugs at the top or bottom of the stairs. If you do have throw rugs, attach them to the floor with carpet tape. Make sure that you have a light switch at the top of the stairs and the bottom of the stairs. If you do not have them, ask someone to add them for you. What else can I do to help prevent falls? Wear shoes that: Do not have high heels. Have rubber bottoms. Are comfortable and fit you well. Are closed at the toe. Do not wear sandals. If you use a stepladder: Make sure that it is fully opened. Do not climb a closed stepladder. Make sure that both sides of the stepladder are locked into place. Ask someone to hold it for you, if possible. Clearly mark and make sure that you can  see: Any grab bars or handrails. First and last steps. Where the edge of each step is. Use tools that help you move around (mobility aids) if they are needed. These include: Canes. Walkers. Scooters. Crutches. Turn on the lights when you go into a dark area. Replace any light bulbs as soon as they burn out. Set up your furniture so you have a clear path. Avoid moving your furniture around. If any of your floors are uneven, fix them. If there are any pets around you, be aware of where they are. Review your medicines with your doctor. Some medicines can make you feel dizzy. This can increase your chance of falling. Ask your doctor what other things that you can do to help prevent falls. This information is not intended to replace advice given to you by your health care provider. Make sure you discuss any questions you have with your health care provider. Document Released: 06/30/2009 Document Revised: 02/09/2016 Document Reviewed: 10/08/2014 Elsevier Interactive Patient Education  2017 [employer].

## 2023-12-06 NOTE — Progress Notes (Signed)
   This service is provided via telemedicine  No vital signs collected/recorded due to the encounter was a telemedicine visit.   Location of patient (ex: home, work):  Home  Patient consents to a telephone visit: Yes  Location of the provider (ex: office, home):  [facility] Office   Name of any referring provider:  N/A  Names of all persons participating in the telemedicine service and their role in the encounter:  Ronald Pippins, CMA, Patient, and   Time spent on call:  9 min with medical assistant  Ngetich, Donalee Citrin, NP

## 2023-12-06 NOTE — Progress Notes (Signed)
 Subjective:   Amy Andrade is a 88 y.o. female who presents for Medicare Annual (Subsequent) preventive examination.  Visit Complete: Virtual I connected with  Volanda Mangine Kreamer on 12/06/23 by a video and audio enabled telemedicine application and verified that I am speaking with the correct person using two identifiers.  Patient Location: Home  Provider Location: Office/Clinic  I discussed the limitations of evaluation and management by telemedicine. The patient expressed understanding and agreed to proceed.  Vital Signs: Because this visit was a virtual/telehealth visit, some criteria may be missing or patient reported. Any vitals not documented were not able to be obtained and vitals that have been documented are patient reported.  Patient Medicare AWV questionnaire was completed by the patient on 12/06/2023; I have confirmed that all information answered by patient is correct and no changes since this date.  Cardiac Risk Factors include: advanced age (>76men, >37 women);hypertension;sedentary lifestyle;family history of premature cardiovascular disease;smoking/ tobacco exposure     Objective:    There were no vitals filed for this visit. There is no height or weight on file to calculate BMI.     12/06/2023    1:04 PM 03/08/2023    8:05 AM 03/05/2023    8:31 AM 11/30/2022    1:29 PM 10/31/2022   10:03 AM 09/03/2022   11:24 AM 02/27/2022    4:07 PM  Advanced Directives  Does Patient Have a Medical Advance Directive? Yes Yes Yes Yes Yes Yes Yes  Type of Estate agent of [place];Living will Healthcare Power of [employer] Power of [employer] Power of [employer] Power of [employer] Power of [place];Living will Healthcare Power of Attorney  Does patient want to make changes to medical advance directive? No - Guardian declined No - Patient declined No - Patient declined  No - Patient declined No - Patient declined No - Patient  declined  Copy of Healthcare Power of Attorney in Chart? Yes - validated most recent copy scanned in chart (See row information) Yes - validated most recent copy scanned in chart (See row information) Yes - validated most recent copy scanned in chart (See row information) Yes - validated most recent copy scanned in chart (See row information) Yes - validated most recent copy scanned in chart (See row information) Yes - validated most recent copy scanned in chart (See row information) Yes - validated most recent copy scanned in chart (See row information)    Current Medications (verified) Outpatient Encounter Medications as of 12/06/2023  Medication Sig   amLODipine (NORVASC) 10 MG tablet Take 1 tablet by mouth once daily   aspirin 81 MG tablet Take 81 mg by mouth daily.   CALCIUM-VITAMIN D PO Take 1 tablet by mouth 2 (two) times daily.   HYDROcodone-acetaminophen (NORCO/VICODIN) 5-325 MG tablet Take 1 tablet by mouth every 12 (twelve) hours as needed for severe pain (pain score 7-10).   irbesartan (AVAPRO) 300 MG tablet Take 1 tablet by mouth once daily   methocarbamol (ROBAXIN) 500 MG tablet Take 1 tablet (500 mg total) by mouth daily.   nitroGLYCERIN (NITROSTAT) 0.4 MG SL tablet Place 1 tablet (0.4 mg total) under the tongue every 5 (five) minutes x 3 doses as needed for chest pain.   nystatin cream (MYCOSTATIN) Apply 1 application  topically 2 (two) times daily.   pravastatin (PRAVACHOL) 40 MG tablet Take 1 tablet by mouth once daily   triamcinolone cream (KENALOG) 0.1 % Apply 1 Application topically 2 (two) times daily.  Affected area on right foot   No facility-administered encounter medications on file as of 12/06/2023.    Allergies (verified) Patient has no known allergies.   History: Past Medical History:  Diagnosis Date   Alzheimer disease (HCC)    Asthma    Cancer (HCC)    Hyperlipidemia    Hypertension    Pulmonary embolism (HCC) 2004   was short of breath--was on coumadin  for 6 mos to a year   Past Surgical History:  Procedure Laterality Date   ABDOMINAL HYSTERECTOMY     BUNIONECTOMY     CATARACT EXTRACTION Bilateral 06/17/2022   REPLACEMENT TOTAL KNEE  1980s, 2003   x2   Family History  Problem Relation Age of Onset   Alzheimer's disease Mother    Chronic bronchitis Mother    Heart disease Father    High blood pressure Father    High blood pressure Brother    Asthma Daughter    High blood pressure Son    High Cholesterol Son    Post-traumatic stress disorder Son    Social History   Socioeconomic History   Marital status: Widowed    Spouse name: Not on file   Number of children: 2   Years of education: Not on file   Highest education level: Not on file  Occupational History   Not on file  Tobacco Use   Smoking status: Former    Current packs/day: 0.00    Average packs/day: 0.3 packs/day for 4.0 years (1.0 ttl pk-yrs)    Types: Cigarettes    Start date: 09/17/1953    Quit date: 09/17/1957    Years since quitting: 66.2   Smokeless tobacco: Never  Vaping Use   Vaping status: Never Used  Substance and Sexual Activity   Alcohol use: Yes    Alcohol/week: 2.0 standard drinks of alcohol    Types: 2 Glasses of wine per week    Comment: 1 4oz cup with lunch and dinner   Drug use: No   Sexual activity: Not Currently  Other Topics Concern   Not on file  Social History Narrative   Diet: Eats well      Do you drink/ eat things with caffeine?Yes, 2 cups of coffee a day      Marital status:    Married--widowed in 2017                           What year were you married ? 1966      Do you live in a house, apartment,assistred living, condo, trailer, etc.)? House      Is it one or more stories? 2      How many persons live in your home ?  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  Yes                            Type & how often: Stationary bike 2 x/week       Do you have a living will? Yes      Do you have a DNR form?  Yes                     If not, do you want  to discuss one?       Do you have signed POA?HPOA forms?   Yes              If so, please bring to your        appointment      Social Drivers of Health   Financial Resource Strain: Low Risk  (11/18/2017)   Overall Financial Resource Strain (CARDIA)    Difficulty of Paying Living Expenses: Not hard at all  Food Insecurity: No Food Insecurity (12/06/2023)   Hunger Vital Sign    Worried About Running Out of Food in the Last Year: Never true    Ran Out of Food in the Last Year: Never true  Transportation Needs: No Transportation Needs (12/06/2023)   PRAPARE - Administrator, Civil Service (Medical): No    Lack of Transportation (Non-Medical): No  Physical Activity: Inactive (11/18/2017)   Exercise Vital Sign    Days of Exercise per Week: 0 days    Minutes of Exercise per Session: 0 min  Stress: No Stress Concern Present (11/18/2017)   [employer] of Occupational Health - Occupational Stress Questionnaire    Feeling of Stress : Only a little  Social Connections: Moderately Isolated (11/18/2017)   Social Connection and Isolation Panel [NHANES]    Frequency of Communication with Friends and Family: More than three times a week    Frequency of Social Gatherings with Friends and Family: More than three times a week    Attends Religious Services: Never    Database administrator or Organizations: No    Attends Banker Meetings: Never    Marital Status: Widowed    Tobacco Counseling Counseling given: Not Answered   Clinical Intake:  Pre-visit preparation completed: No  Pain : No/denies pain     BMI - recorded: 25.63 Nutritional Status: BMI 25 -29 Overweight Nutritional Risks: None Diabetes: No  How often do you need to have someone help you when you read instructions, pamphlets, or other written materials from your doctor or pharmacy?: 1 -  Never What is the last grade level you completed in school?: PHd  Interpreter Needed?: No      Activities of Daily Living    12/06/2023    1:31 PM  In your present state of health, do you have any difficulty performing the following activities:  Hearing? 0  Vision? 0  Difficulty concentrating or making decisions? 1  Comment Remembering  Walking or climbing stairs? 1  Comment walker  Dressing or bathing? 1  Doing errands, shopping? 1  Comment family Ship broker and eating ? Y  Using the Toilet? N  In the past six months, have you accidently leaked urine? N  Do you have problems with loss of bowel control? N  Managing your Medications? Y  Managing your Finances? Y  Comment daughter assist  Housekeeping or managing your Housekeeping? Y  Comment daughter assist    Patient Care Team: Bron Snellings, Donalee Citrin, NP as PCP - General (Family Medicine) Chalmers Guest, MD as Consulting Physician (Ophthalmology)  Indicate any recent Medical Services you may have received from other than [facility] providers in the past year (date may be approximate).     Assessment:   This is a routine wellness examination for Amy Andrade.  Hearing/Vision screen Hearing Screening - Comments:: No hearing issues. Vision Screening - Comments:: Eye exam 6 months ago eye surgery.   Goals Addressed  This Visit's Progress    Patient Stated   On track    Maintain mobility        Depression Screen    12/06/2023    1:04 PM 03/08/2023    8:29 AM 03/05/2023    8:32 AM 11/30/2022    1:30 PM 11/28/2021    1:40 PM 11/24/2020    1:19 PM 12/09/2019    1:12 PM  PHQ 2/9 Scores  PHQ - 2 Score 0 0 0 0 0 0 0  Exception Documentation   Other- indicate reason in comment box      Not completed   64M F/U        Fall Risk    12/06/2023    1:04 PM 03/08/2023    8:27 AM 03/05/2023    8:31 AM 11/30/2022    1:29 PM 10/31/2022   10:46 AM  Fall Risk   Falls in the past year? 0 0 0 0 0  Number falls in  past yr: 0 0 0 0 0  Injury with Fall? 0 0 0 0 0  Risk for fall due to : No Fall Risks No Fall Risks No Fall Risks No Fall Risks No Fall Risks  Follow up Falls evaluation completed Falls evaluation completed Falls evaluation completed Falls evaluation completed;Education provided;Falls prevention discussed Falls evaluation completed    MEDICARE RISK AT HOME: Medicare Risk at Home Any stairs in or around the home?: No If so, are there any without handrails?: No Home free of loose throw rugs in walkways, pet beds, electrical cords, etc?: No Adequate lighting in your home to reduce risk of falls?: Yes Life alert?: No Use of a cane, walker or w/c?: Yes Grab bars in the bathroom?: Yes Shower chair or bench in shower?: Yes Elevated toilet seat or a handicapped toilet?: Yes  TIMED UP AND GO:  Was the test performed?  No    Cognitive Function:    11/18/2017    1:31 PM 06/18/2016    9:47 AM 04/21/2015    9:13 AM  MMSE - Mini Mental State Exam  Orientation to time 2 3 2   Orientation to Place 5 5 5   Registration 3 3 3   Attention/ Calculation 5 5 5   Recall 1 3 1   Language- name 2 objects 2 2 2   Language- repeat 1 1 1   Language- follow 3 step command 3 3 3   Language- read & follow direction 1 1 1   Write a sentence 1 1 1   Copy design 1 1 1   Total score 25 28 25         12/06/2023    1:07 PM 11/30/2022    1:32 PM 11/28/2021    1:41 PM 11/24/2020    1:21 PM 11/23/2019    1:42 PM  6CIT Screen  What Year? 0 points 4 points 4 points 0 points 4 points  What month? 0 points 3 points 3 points 0 points 3 points  What time? 0 points 0 points 0 points 0 points 0 points  Count back from 20 0 points 0 points 0 points 0 points 0 points  Months in reverse 0 points 0 points 0 points 0 points 0 points  Repeat phrase 8 points 10 points 10 points 10 points 0 points  Total Score 8 points 17 points 17 points 10 points 7 points    Immunizations Immunization History  Administered Date(s) Administered    Fluad Quad(high Dose 65+) 08/30/2021, 09/03/2022   Influenza, High Dose Seasonal PF 10/27/2018,  07/18/2020   Influenza,inj,Quad PF,6+ Mos 05/27/2015, 06/18/2016   Influenza-Unspecified 05/18/2017   Pneumococcal Conjugate-13 10/11/2014   Pneumococcal Polysaccharide-23 01/20/2016   Pneumococcal-Unspecified 09/17/2020   Tdap 09/17/2010, 03/05/2023   Zoster Recombinant(Shingrix) 09/17/2020   Zoster, Live 07/22/2014    TDAP status: Up to date  Flu Vaccine status: Up to date  Pneumococcal vaccine status: Up to date  Covid-19 vaccine status: Information provided on how to obtain vaccines.   Qualifies for Shingles Vaccine? Yes   Zostavax completed No   Shingrix Completed?: No.    Education has been provided regarding the importance of this vaccine. Patient has been advised to call insurance company to determine out of pocket expense if they have not yet received this vaccine. Advised may also receive vaccine at local pharmacy or Health Dept. Verbalized acceptance and understanding.  Screening Tests Health Maintenance  Topic Date Due   Zoster Vaccines- Shingrix (2 of 2) 11/12/2020   COVID-19 Vaccine (1 - 2024-25 season) Never done   INFLUENZA VACCINE  12/16/2023 (Originally 04/18/2023)   Medicare Annual Wellness (AWV)  12/05/2024   DTaP/Tdap/Td (3 - Td or Tdap) 03/04/2033   Pneumonia Vaccine 53+ Years old  Completed   DEXA SCAN  Completed   HPV VACCINES  Aged Out    Health Maintenance  Health Maintenance Due  Topic Date Due   Zoster Vaccines- Shingrix (2 of 2) 11/12/2020   COVID-19 Vaccine (1 - 2024-25 season) Never done    Colorectal cancer screening: No longer required.   Mammogram status: No longer required due to advance age .  Bone Density status: Completed 04/04/2022. Results reflect: Bone density results: OSTEOPENIA. Repeat every 2 years.  Lung Cancer Screening: (Low Dose CT Chest recommended if Age 69-80 years, 20 pack-year currently smoking OR have quit w/in 15years.)  does not qualify.   Lung Cancer Screening Referral: N/A   Additional Screening:  Hepatitis C Screening: does not qualify; Completed N/A   Vision Screening: Recommended annual ophthalmology exams for early detection of glaucoma and other disorders of the eye. Is the patient up to date with their annual eye exam?  Yes  Who is the provider or what is the name of the office in which the patient attends annual eye exams? [facility]  If pt is not established with a provider, would they like to be referred to a provider to establish care? No .   Dental Screening: Recommended annual dental exams for proper oral hygiene  Diabetic Foot Exam: Diabetic Foot Exam: Completed N/A   Community Resource Referral / Chronic Care Management: CRR required this visit?  No   CCM required this visit?  No     Plan:     I have personally reviewed and noted the following in the patient's chart:   Medical and social history Use of alcohol, tobacco or illicit drugs  Current medications and supplements including opioid prescriptions. Patient is currently taking opioid prescriptions. Information provided to patient regarding non-opioid alternatives. Patient advised to discuss non-opioid treatment plan with their provider. Functional ability and status Nutritional status Physical activity Advanced directives List of other physicians Hospitalizations, surgeries, and ER visits in previous 12 months Vitals Screenings to include cognitive, depression, and falls Referrals and appointments  In addition, I have reviewed and discussed with patient certain preventive protocols, quality metrics, and best practice recommendations. A written personalized care plan for preventive services as well as general preventive health recommendations were provided to patient.     Caesar Bookman, NP  12/06/2023   After Visit Summary: (MyChart) Due to this being a telephonic visit, the after visit summary with patients  personalized plan was offered to patient via MyChart   Nurse Notes: Advised to get COVID-19 and shingles vaccine at the pharmacy   I connected with  Amy Andrade on 12/06/23 by a video enabled telemedicine application and verified that I am speaking with the correct person using two identifiers.   I discussed the limitations of evaluation and management by telemedicine. The patient expressed understanding and agreed to proceed.   Spent 26  minutes of face to face with patient  >50% time spent counseling; reviewing medical record; AWV and developing future plan of care.

## 2023-12-15 NOTE — Progress Notes (Signed)
   This encounter was created in error - please disregard. No show

## 2024-01-23 ENCOUNTER — Other Ambulatory Visit: Payer: Self-pay | Admitting: Family

## 2024-01-23 DIAGNOSIS — G8929 Other chronic pain: Secondary | ICD-10-CM

## 2024-01-23 NOTE — Telephone Encounter (Signed)
 Last ov 12/06/23  Last filled on 07/29/23 qty of 90 x 1 refills   Next OV 02/28/24

## 2024-01-28 ENCOUNTER — Encounter: Payer: Self-pay | Admitting: Podiatry

## 2024-01-28 ENCOUNTER — Ambulatory Visit (INDEPENDENT_AMBULATORY_CARE_PROVIDER_SITE_OTHER): Payer: Medicare Other | Admitting: Podiatry

## 2024-01-28 VITALS — Ht 70.0 in | Wt 178.6 lb

## 2024-01-28 DIAGNOSIS — M79675 Pain in left toe(s): Secondary | ICD-10-CM | POA: Diagnosis not present

## 2024-01-28 DIAGNOSIS — B351 Tinea unguium: Secondary | ICD-10-CM

## 2024-01-28 DIAGNOSIS — M79674 Pain in right toe(s): Secondary | ICD-10-CM | POA: Diagnosis not present

## 2024-01-29 NOTE — Progress Notes (Signed)
  Subjective:  Patient ID: Amy Andrade, female    DOB: 29-Oct-1935,  MRN: [id number]  88 y.o. female presents painful mycotic toenails x 10 which interfere with daily activities. Pain is relieved with periodic professional debridement. She is accompanied by her son on today's visit. Chief Complaint  Patient presents with   Nail Problem    Pt is here for [facility] PCP is Dr Arlis Lakes and LOV was in March.   New problem(s): None   No Known Allergies  Review of Systems: Negative except as noted in the HPI.   Objective:  Amy Andrade is a pleasant 88 y.o. female in NAD. AAO x 3.  Vascular Examination: Vascular status intact b/l with palpable pedal pulses. CFT immediate b/l. Pedal hair diminished. Trace edema. No pain with calf compression b/l. Skin temperature gradient WNL b/l. No cyanosis or clubbing noted.  Neurological Examination: Sensation grossly intact b/l with 10 gram monofilament. Vibratory sensation intact b/l.  Dermatological Examination: Pedal skin with normal turgor, texture and tone b/l. No open wounds nor interdigital macerations noted. Toenails 1-5 b/l thick, discolored, elongated with subungual debris and pain on dorsal palpation. No hyperkeratotic lesions noted b/l.   Musculoskeletal Examination: Muscle strength 5/5 to b/l LE.  Hammertoe deformity noted 2-5 b/l.  Radiographs: None  Last A1c:       No data to display          Assessment:   1. Pain due to onychomycosis of toenails of both feet     Plan:  Consent given for treatment. Patient examined. All patient's and/or POA's questions/concerns addressed on today's visit. Toenails 1-5 debrided in length and girth without incident. Continue foot and shoe inspections daily. Monitor blood glucose per PCP/Endocrinologist's recommendations. Continue soft, supportive shoe gear daily. Report any pedal injuries to medical professional. Call office if there are any questions/concerns. -Patient/POA to call should there  be question/concern in the interim.  Return in about 3 months (around 04/29/2024).  Luella Sager, DPM      [facility] LOCATION: [address] [place] [address]                   Office [phone]   [facility] LOCATION: [address] [place], [place] [address] Office [phone]

## 2024-02-25 ENCOUNTER — Other Ambulatory Visit: Payer: Medicare Other

## 2024-02-25 DIAGNOSIS — I10 Essential (primary) hypertension: Secondary | ICD-10-CM

## 2024-02-25 DIAGNOSIS — E782 Mixed hyperlipidemia: Secondary | ICD-10-CM | POA: Diagnosis not present

## 2024-02-25 LAB — CBC WITH DIFFERENTIAL/PLATELET
Absolute Lymphocytes: 1648 {cells}/uL (ref 850–3900)
Absolute Monocytes: 551 {cells}/uL (ref 200–950)
Basophils Absolute: 21 {cells}/uL (ref 0–200)
Basophils Relative: 0.4 %
Eosinophils Absolute: 80 {cells}/uL (ref 15–500)
Eosinophils Relative: 1.5 %
HCT: 31.9 % — ABNORMAL LOW (ref 35.0–45.0)
Hemoglobin: 9.9 g/dL — ABNORMAL LOW (ref 11.7–15.5)
MCH: 28.2 pg (ref 27.0–33.0)
MCHC: 31 g/dL — ABNORMAL LOW (ref 32.0–36.0)
MCV: 90.9 fL (ref 80.0–100.0)
MPV: 9.4 fL (ref 7.5–12.5)
Monocytes Relative: 10.4 %
Neutro Abs: 3000 {cells}/uL (ref 1500–7800)
Neutrophils Relative %: 56.6 %
Platelets: 375 Thousand/uL (ref 140–400)
RBC: 3.51 Million/uL — ABNORMAL LOW (ref 3.80–5.10)
RDW: 13.5 % (ref 11.0–15.0)
Total Lymphocyte: 31.1 %
WBC: 5.3 Thousand/uL (ref 3.8–10.8)

## 2024-02-25 LAB — COMPLETE METABOLIC PANEL WITHOUT GFR
AG Ratio: 1.6 (calc) (ref 1.0–2.5)
ALT: 7 U/L (ref 6–29)
AST: 11 U/L (ref 10–35)
Albumin: 3.7 g/dL (ref 3.6–5.1)
Alkaline phosphatase (APISO): 68 U/L (ref 37–153)
BUN: 10 mg/dL (ref 7–25)
CO2: 26 mmol/L (ref 20–32)
Calcium: 9.2 mg/dL (ref 8.6–10.4)
Chloride: 97 mmol/L — ABNORMAL LOW (ref 98–110)
Creat: 0.71 mg/dL (ref 0.60–0.95)
Globulin: 2.3 g/dL (ref 1.9–3.7)
Glucose, Bld: 91 mg/dL (ref 65–99)
Potassium: 4.7 mmol/L (ref 3.5–5.3)
Sodium: 131 mmol/L — ABNORMAL LOW (ref 135–146)
Total Bilirubin: 0.5 mg/dL (ref 0.2–1.2)
Total Protein: 6 g/dL — ABNORMAL LOW (ref 6.1–8.1)

## 2024-02-25 LAB — LIPID PANEL
Cholesterol: 179 mg/dL (ref <200)
HDL: 101 mg/dL (ref >=50)
LDL Cholesterol (Calc): 65 mg/dL
Non-HDL Cholesterol (Calc): 78 mg/dL (ref <130)
Total CHOL/HDL Ratio: 1.8 (calc) (ref <5.0)
Triglycerides: 54 mg/dL (ref <150)

## 2024-02-25 LAB — TSH: TSH: 1.6 m[IU]/L (ref 0.40–4.50)

## 2024-02-27 ENCOUNTER — Ambulatory Visit: Payer: Self-pay | Admitting: Family

## 2024-02-28 ENCOUNTER — Encounter: Payer: Medicare Other | Admitting: Family

## 2024-02-28 NOTE — Progress Notes (Signed)
   This encounter was created in error - please disregard. No show

## 2024-03-17 DIAGNOSIS — M24412 Recurrent dislocation, left shoulder: Secondary | ICD-10-CM | POA: Diagnosis not present

## 2024-03-17 DIAGNOSIS — M19012 Primary osteoarthritis, left shoulder: Secondary | ICD-10-CM | POA: Diagnosis not present

## 2024-04-19 ENCOUNTER — Other Ambulatory Visit: Payer: Self-pay | Admitting: Family

## 2024-04-19 DIAGNOSIS — I10 Essential (primary) hypertension: Secondary | ICD-10-CM

## 2024-04-26 ENCOUNTER — Other Ambulatory Visit: Payer: Self-pay | Admitting: Family

## 2024-04-26 DIAGNOSIS — I10 Essential (primary) hypertension: Secondary | ICD-10-CM

## 2024-04-26 DIAGNOSIS — G8929 Other chronic pain: Secondary | ICD-10-CM

## 2024-05-06 ENCOUNTER — Ambulatory Visit (INDEPENDENT_AMBULATORY_CARE_PROVIDER_SITE_OTHER): Admitting: Podiatry

## 2024-05-06 DIAGNOSIS — Z91199 Patient's noncompliance with other medical treatment and regimen due to unspecified reason: Secondary | ICD-10-CM

## 2024-05-06 NOTE — Progress Notes (Signed)
 1. No-show for appointment

## 2024-05-19 ENCOUNTER — Ambulatory Visit: Payer: Self-pay

## 2024-05-19 NOTE — Telephone Encounter (Signed)
   FYI Only or Action Required?: Action required by provider: request for appointment.  Patient was last seen in primary care on 12/06/2023 by Ngetich, Roxan BROCKS, NP.  Called Nurse Triage reporting Loss of Consciousness.  Symptoms began Sat.  Interventions attempted: Nothing.  Symptoms are: stable.  Triage Disposition: See HCP Within 4 Hours (Or PCP Triage)  Patient/caregiver understands and will follow disposition?: no appt seen needsa appt         Copied from CRM #[id number]. Topic: Clinical - Red Word Triage >> May 19, 2024  8:34 AM [name] wrote: Red Word that prompted transfer to Nurse Triage: vomitting out of nose/fainted / on 08/31 Reason for Disposition  [1] All other patients AND [2] now alert and feels fine  (Exception: SIMPLE FAINT due to stress, pain, prolonged standing, or suddenly standing)  Answer Assessment - Initial Assessment Questions 1. ONSET: How long were you unconscious? (e.g., minutes, seconds) When did it happen?     Sat- may have passed out form gagging  2. CONTENT: What happened during the period of unconsciousness? (e.g., seizure activity)      Vomited  3. MENTAL STATUS: Alert and oriented now? (e.g., oriented x 3 = name, month, location)      Back to baseline 4. TRIGGER: What do you think caused the fainting? What were you doing just before you fainted?  (e.g., exercise, sudden standing up, prolonged standing)     no 5. RECURRENT SYMPTOM: Have you ever passed out before? If Yes, ask: When was the last time? and What happened that time?      no 6. INJURY: Did you hurt yourself when you fell?      no 8. NEUROLOGIC SYMPTOMS: Have you had any of the following symptoms: headache, numbness, vertigo, weakness?     At baseline dementia 9. GI SYMPTOMS: Have you had any of the following symptoms: abdomen pain, vomiting, diarrhea, blood in stools?     Episode vomiting 10. OTHER SYMPTOMS: Do you have any other symptoms?        no  Protocols used: Fainting-A-AH

## 2024-05-19 NOTE — Telephone Encounter (Signed)
 Patient does have an appointment to be seen in office, patient POA is asking if the patient would need blood work before she eats. Please Advise  Message sent to Ngetich, Dinah C, NP

## 2024-05-19 NOTE — Telephone Encounter (Signed)
 was patient seen in the ED for loss of consciousness?  Will order blood work during visit.

## 2024-05-20 NOTE — Telephone Encounter (Signed)
 Noted

## 2024-05-20 NOTE — Telephone Encounter (Signed)
 Patient POA stated that she was not seen in the ED and will be there for her appointment on tomorrow.  Message sent to Ngetich, Dinah C, NP

## 2024-05-21 ENCOUNTER — Ambulatory Visit: Admitting: Family

## 2024-05-21 ENCOUNTER — Encounter: Payer: Self-pay | Admitting: Family

## 2024-05-21 VITALS — BP 132/76 | HR 66 | Temp 97.6°F | Resp 19 | Ht 70.0 in | Wt 181.6 lb

## 2024-05-21 DIAGNOSIS — R55 Syncope and collapse: Secondary | ICD-10-CM

## 2024-05-21 DIAGNOSIS — G894 Chronic pain syndrome: Secondary | ICD-10-CM | POA: Diagnosis not present

## 2024-05-21 DIAGNOSIS — M25511 Pain in right shoulder: Secondary | ICD-10-CM

## 2024-05-21 DIAGNOSIS — E782 Mixed hyperlipidemia: Secondary | ICD-10-CM | POA: Diagnosis not present

## 2024-05-21 DIAGNOSIS — B379 Candidiasis, unspecified: Secondary | ICD-10-CM | POA: Diagnosis not present

## 2024-05-21 DIAGNOSIS — Z23 Encounter for immunization: Secondary | ICD-10-CM

## 2024-05-21 DIAGNOSIS — L853 Xerosis cutis: Secondary | ICD-10-CM

## 2024-05-21 DIAGNOSIS — I10 Essential (primary) hypertension: Secondary | ICD-10-CM | POA: Diagnosis not present

## 2024-05-22 ENCOUNTER — Other Ambulatory Visit: Payer: Self-pay | Admitting: Family

## 2024-05-22 ENCOUNTER — Ambulatory Visit: Payer: Self-pay | Admitting: Family

## 2024-05-22 ENCOUNTER — Ambulatory Visit
Admission: RE | Admit: 2024-05-22 | Discharge: 2024-05-22 | Disposition: A | Discharge status: Home / Self Care | Source: Ambulatory Visit | Attending: Family | Admitting: Family

## 2024-05-22 DIAGNOSIS — M25511 Pain in right shoulder: Secondary | ICD-10-CM | POA: Diagnosis not present

## 2024-05-22 DIAGNOSIS — M7989 Other specified soft tissue disorders: Secondary | ICD-10-CM | POA: Diagnosis not present

## 2024-05-22 DIAGNOSIS — S43001A Unspecified subluxation of right shoulder joint, initial encounter: Secondary | ICD-10-CM

## 2024-05-22 DIAGNOSIS — M79621 Pain in right upper arm: Secondary | ICD-10-CM | POA: Diagnosis not present

## 2024-05-22 DIAGNOSIS — E785 Hyperlipidemia, unspecified: Secondary | ICD-10-CM

## 2024-05-24 NOTE — Progress Notes (Signed)
 Provider: Roxan Plough FNP-C  Izayiah Tibbitts, Roxan BROCKS, NP  Patient Care Team: Xan Sparkman, Roxan BROCKS, NP as PCP - General (Family Medicine) Cyrus Carwin, MD as Consulting Physician (Ophthalmology)  Extended Emergency Contact Information Primary Emergency Contact: Dentler,[name] Address: [address]          [place], [place] [address] [place] Home Phone: [phone] Relation: Son Secondary Emergency Contact: Stotts,[name] Address: [address] [place], [place] [address] [place] Home Phone: [phone] Relation: Other  Code Status:  Full Code  Goals of care: Advanced Directive information    05/21/2024   11:51 AM  Advanced Directives  Does Patient Have a Medical Advance Directive? Yes  Type of Advance Directive Healthcare Power of Attorney  Does patient want to make changes to medical advance directive? No - Patient declined  Copy of Healthcare Power of Attorney in Chart? Yes - validated most recent copy scanned in chart (See row information)     Chief Complaint  Patient presents with   Loss of Consciousness    Discussed the use of AI scribe software for clinical note transcription with the patient, who gave verbal consent to proceed.  History of Present Illness   Amy Andrade is an 88 year old female who presents with persistent coughing and vomiting episodes.  On Sunday morning, she experienced a persistent coughing episode approximately 45 minutes after eating breakfast, leading to vomiting through her mouth and nose. She was found in her recliner, coughing and attempting to clear her airways.  Following the coughing and vomiting, she exhibited nonsensical speech and reported a sensation of something 'tickling' her. Her caregiver performed a stroke assessment, which she passed, and emergency services were called. Paramedics found her vitals stable, and she passed her full stroke assessment.  Post-incident, she  complained of soreness in her right shoulder and neck, and her caregiver noted a lump in her right bicep muscle. She was administered methocarbamol  and a painkiller, and after about 45 minutes, she was able to walk to the bathroom. Her mental status returned to normal subsequently.  She has not experienced any swallowing issues since the incident, although she had some difficulty on the day of the event, likely due to residual vomit. Her blood pressure has been stable at home, though paramedics noted it was slightly low, possibly due to vomiting.  She has a history of COVID-19 infection about one and a half to two months ago, which left her feeling weak and lacking energy. No dizziness, chest pain, or significant changes in her medication regimen have occurred since then.  Her current medications include aspirin , nitroglycerin  (not used recently), nystatin  as needed, calcium and vitamin D, triamcinolone  cream as needed, hydrocodone  for pain towards the end of the month, pravastatin , amlodipine  10 mg, Avapro  300 mg daily, and metoclopramide 500 mg daily.    Past Medical History:  Diagnosis Date   Alzheimer disease (HCC)    Asthma    Cancer (HCC)    Hyperlipidemia    Hypertension    Pulmonary embolism (HCC) 2004   was short of breath--was on coumadin for 6 mos to a year   Past Surgical History:  Procedure Laterality Date   ABDOMINAL HYSTERECTOMY     BUNIONECTOMY     CATARACT EXTRACTION Bilateral 06/17/2022   REPLACEMENT TOTAL KNEE  1980s, 2003   x2    No Known Allergies  Outpatient Encounter Medications as of 05/21/2024  Medication Sig   amLODipine  (NORVASC ) 10 MG tablet Take 1 tablet by mouth once daily   aspirin  81 MG tablet Take 81 mg by mouth daily.   CALCIUM-VITAMIN D PO Take 1 tablet by mouth 2 (two) times daily.   HYDROcodone -acetaminophen  (NORCO/VICODIN) 5-325 MG tablet Take 1 tablet by mouth every 12 (twelve) hours as needed for severe pain (pain score 7-10).   irbesartan   (AVAPRO ) 300 MG tablet Take 1 tablet by mouth once daily   methocarbamol  (ROBAXIN ) 500 MG tablet Take 1 tablet by mouth once daily   nitroGLYCERIN  (NITROSTAT ) 0.4 MG SL tablet Place 1 tablet (0.4 mg total) under the tongue every 5 (five) minutes x 3 doses as needed for chest pain.   nystatin  cream (MYCOSTATIN ) Apply 1 application  topically 2 (two) times daily. (Patient taking differently: Apply 1 application  topically as needed.)   triamcinolone  cream (KENALOG ) 0.1 % Apply 1 Application topically 2 (two) times daily. Affected area on right foot (Patient taking differently: Apply 1 Application topically as needed. Affected area on right foot)   [DISCONTINUED] pravastatin  (PRAVACHOL ) 40 MG tablet Take 1 tablet by mouth once daily   No facility-administered encounter medications on file as of 05/21/2024.    Review of Systems  Constitutional:  Negative for appetite change, chills, fatigue, fever and unexpected weight change.  HENT:  Negative for congestion, ear discharge, ear pain, hearing loss, postnasal drip, rhinorrhea, sinus pressure, sinus pain, sneezing, sore throat and tinnitus.   Eyes:  Negative for pain, discharge, redness, itching and visual disturbance.  Respiratory:  Negative for cough, chest tightness, shortness of breath and wheezing.   Cardiovascular:  Negative for chest pain, palpitations and leg swelling.  Gastrointestinal:  Negative for abdominal distention, abdominal pain, constipation, diarrhea, nausea and vomiting.  Musculoskeletal:  Positive for arthralgias and gait problem. Negative for back pain, joint swelling, myalgias, neck pain and neck stiffness.  Skin:  Negative for color change, pallor, rash and wound.  Neurological:  Negative for dizziness, speech difficulty, weakness, light-headedness, numbness and headaches.    Immunization History  Administered Date(s) Administered   Fluad Quad(high Dose 65+) 08/30/2021, 09/03/2022   INFLUENZA, HIGH DOSE SEASONAL PF 10/27/2018,  07/18/2020, 05/21/2024   Influenza,inj,Quad PF,6+ Mos 05/27/2015, 06/18/2016   Influenza-Unspecified 05/18/2017   Pneumococcal Conjugate-13 10/11/2014   Pneumococcal Polysaccharide-23 01/20/2016   Pneumococcal-Unspecified 09/17/2020   Tdap 09/17/2010, 03/05/2023   Zoster Recombinant(Shingrix) 09/17/2020   Zoster, Live 07/22/2014   Pertinent  Health Maintenance Due  Topic Date Due   Influenza Vaccine  Completed   DEXA SCAN  Completed      11/30/2022    1:29 PM 03/05/2023    8:31 AM 03/08/2023    8:27 AM 12/06/2023    1:04 PM 05/21/2024   11:50 AM  Fall Risk  Falls in the past year? 0 0 0 0 1  Was there an injury with Fall? 0 0 0 0 1  Fall Risk Category Calculator 0 0 0 0 2  Patient at Risk for Falls Due to No Fall Risks No Fall Risks No Fall Risks No Fall Risks No Fall Risks  Fall risk Follow up Falls evaluation completed;Education provided;Falls prevention discussed Falls evaluation completed Falls evaluation completed Falls evaluation completed Falls evaluation completed   Functional Status Survey:    Vitals:   05/21/24 1154  BP: 132/76  Pulse: 66  Resp: 19  Temp: 97.6 F (36.4 C)  SpO2: 99%  Weight: 181 lb 9.6 oz (  82.4 kg)  Height: 5' 10 (1.778 m)   Body mass index is 26.06 kg/m. Physical Exam VITALS: T- 97.6, P- 66, BP- 132/76, SaO2- 97% MEASUREMENTS: Weight- 181.6. GENERAL: Alert, cooperative, well developed, no acute distress HEENT: Normocephalic, normal oropharynx, moist mucous membranes CHEST: Clear to auscultation bilaterally, no wheezes, rhonchi, or crackles CARDIOVASCULAR: Normal heart rate and rhythm, S1 and S2 normal without murmurs ABDOMEN: Soft, non-tender, non-distended, without organomegaly, normal bowel sounds EXTREMITIES: No cyanosis or edema MUSCULOSKELETAL: Right shoulder swollen, especially posteriorly NEUROLOGICAL: Cranial nerves II-XII grossly intact, moves all extremities without gross motor or sensory deficit, normal finger-to-nose test,  awake and alert   Labs reviewed: Recent Labs    02/25/24 0809  NA 131*  K 4.7  CL 97*  CO2 26  GLUCOSE 91  BUN 10  CREATININE 0.71  CALCIUM 9.2   Recent Labs    02/25/24 0809  AST 11  ALT 7  BILITOT 0.5  PROT 6.0*   Recent Labs    02/25/24 0809  WBC 5.3  NEUTROABS 3,000  HGB 9.9*  HCT 31.9*  MCV 90.9  PLT 375   Lab Results  Component Value Date   TSH 1.60 02/25/2024   Lab Results  Component Value Date   HGBA1C 5.3 03/08/2021   Lab Results  Component Value Date   CHOL 179 02/25/2024   HDL 101 02/25/2024   LDLCALC 65 02/25/2024   TRIG 54 02/25/2024   CHOLHDL 1.8 02/25/2024    Significant Diagnostic Results in last 30 days:  DG Shoulder Right Result Date: 05/22/2024 CLINICAL DATA:  Additional rib pain and swelling for 5 days after loss of consciousness with fall and year EXAM: RIGHT SHOULDER - 2+ VIEW COMPARISON:  None Available. FINDINGS: Suboptimal positioning with internal rotation of the right upper extremity. There is questionable anterior subluxation of the right humeral head at the glenohumeral joint, although this may be artifactual due to positioning. No fracture. No evidence of acromioclavicular joint separation. Probable degenerative changes at the glenohumeral joint, poorly evaluated. No radiopaque foreign body or pathologic soft tissue calcifications. IMPRESSION: Suboptimal positioning with internal rotation of the right upper extremity. Questionable anterior subluxation of the right humeral head at the glenohumeral joint, although this may be artifactual due to positioning. No fracture. Consider either repeat right shoulder radiographs with improved positioning or further evaluation with noncontrast right shoulder CT. Electronically Signed   By: Selinda DELENA Blue M.D.   On: 05/22/2024 11:42   DG Humerus Right Result Date: 05/22/2024 CLINICAL DATA:  arm swelling and pain EXAM: RIGHT HUMERUS - 2+ VIEW COMPARISON:  None Available. FINDINGS: There is no evidence  of fracture or other focal bone lesions. Soft tissues are unremarkable. IMPRESSION: No right humerus fracture. Electronically Signed   By: Selinda DELENA Blue M.D.   On: 05/22/2024 11:29    Assessment/Plan  Right shoulder and upper arm pain and swelling Acute right shoulder and upper arm pain and swelling without history of trauma or fall. Possible muscular strain or other musculoskeletal issue. No bruising. Pain causes difficulty in lifting the arm. - Order x-ray of right shoulder and upper arm to assess for underlying issues - Administer methocarbamol  for muscle relaxation as needed - Administer hydrocodone  for pain management as needed  Syncope Recent episode of syncope with associated vomiting and coughing. No clear etiology identified. Neurological examination and stroke assessment were normal. Possible vagal response due to coughing.  Chronic pain syndrome Chronic pain managed with hydrocodone , primarily used towards the end of the month  when pain increases. - Continue hydrocodone  as needed for pain management  Hypertension Blood pressure is well-controlled with current medication regimen. Recent reading was 132/76 mmHg.  Hyperlipidemia Currently managed with pravastatin .  Candidiasis of skin and nail Nystatin  used as needed for any chafing under the breast. - Continue nystatin  as needed for candidiasis management  Dermatitis of foot Dermatitis managed with triamcinolone  cream as needed. - Continue triamcinolone  cream as needed for dermatitis management  General Health Maintenance Due for influenza vaccination. Discussion about administering the flu shot on the left arm due to right shoulder pain. - Administer high-dose influenza vaccine on the left arm   Family/ staff Communication: Reviewed plan of care with patient verbalized understanding   Labs/tests ordered:  - Dg right Humerus  - Dg shoulder  Next Appointment: Return if symptoms worsen or fail to improve.   Total  time: 20 minutes. Greater than 50% of total time spent doing patient education regarding right shoulder/arm pain,syncope,HTN,HLD,candidiasis ,dermatitis,health maintenance including symptom/medication management.   Roxan JAYSON Plough, NP

## 2024-06-02 ENCOUNTER — Encounter: Payer: Self-pay | Admitting: Family

## 2024-06-02 ENCOUNTER — Ambulatory Visit: Admitting: Family

## 2024-06-02 ENCOUNTER — Ambulatory Visit: Payer: Self-pay | Admitting: *Deleted

## 2024-06-02 ENCOUNTER — Telehealth: Payer: Self-pay | Admitting: Family

## 2024-06-02 ENCOUNTER — Ambulatory Visit: Admitting: Physician Assistant

## 2024-06-02 VITALS — BP 130/72 | HR 68 | Temp 97.6°F | Resp 18 | Ht 70.0 in | Wt 179.0 lb

## 2024-06-02 DIAGNOSIS — M25512 Pain in left shoulder: Secondary | ICD-10-CM | POA: Diagnosis not present

## 2024-06-02 DIAGNOSIS — G8929 Other chronic pain: Secondary | ICD-10-CM

## 2024-06-02 DIAGNOSIS — M25511 Pain in right shoulder: Secondary | ICD-10-CM | POA: Diagnosis not present

## 2024-06-02 DIAGNOSIS — I10 Essential (primary) hypertension: Secondary | ICD-10-CM | POA: Diagnosis not present

## 2024-06-02 DIAGNOSIS — R5383 Other fatigue: Secondary | ICD-10-CM

## 2024-06-02 DIAGNOSIS — E785 Hyperlipidemia, unspecified: Secondary | ICD-10-CM

## 2024-06-02 DIAGNOSIS — R350 Frequency of micturition: Secondary | ICD-10-CM | POA: Diagnosis not present

## 2024-06-02 MED ORDER — IRBESARTAN 300 MG PO TABS: 300.0000 mg | ORAL_TABLET | Freq: Every day | ORAL | 1 refills | Status: AC

## 2024-06-02 MED ORDER — AMLODIPINE BESYLATE 10 MG PO TABS: 10.0000 mg | ORAL_TABLET | Freq: Every day | ORAL | 1 refills | Status: AC

## 2024-06-02 MED ORDER — PRAVASTATIN SODIUM 40 MG PO TABS: 40.0000 mg | ORAL_TABLET | Freq: Every day | ORAL | 1 refills | Status: AC

## 2024-06-02 NOTE — Telephone Encounter (Signed)
 FYI Only or Action Required?: Action required by provider: clinical question for provider, update on patient condition, and if PCP will allow patient to collect urine  sample at home and bring sample to office for testing.  Patient was last seen in primary care on 05/21/2024 by Ngetich, Roxan BROCKS, NP.  Called Nurse Triage reporting Urinary Frequency.  Symptoms began a week ago.  Interventions attempted: OTC medications: na and Rest, hydration, or home remedies.  Symptoms are: gradually worsening.  Triage Disposition: See Physician Within 24 Hours  Patient/caregiver understands and will follow disposition?: No, wishes to speak with PCP  Please advise if OV needed. Patient son reports urinary frequency and lethargy noted since last OV 05/21/24. Requesting call back.            Copied from CRM [id number]. Topic: Clinical - Red Word Triage >> Jun 02, 2024  8:43 AM Merlynn LABOR wrote: Red Word that prompted transfer to Nurse Triage: Frequent urination, lethargic. Reason for Disposition  Urinating more frequently than usual (i.e., frequency) OR new-onset of the feeling of an urgent need to urinate (i.e., urgency)  Answer Assessment - Initial Assessment Questions Patient son requesting if he can come to office to get urine container and have patient submit urine sample. Please advise if appt / OV needed. Last OV 05/21/24. Please call patient son back.       1. SYMPTOM: What's the main symptom you're concerned about? (e.g., frequency, incontinence)     Urination frequency, lethargic 2. ONSET: When did the  sx   start?     Greater than 1 week  3. PAIN: Is there any pain? If Yes, ask: How bad is it? (Scale: 1-10; mild, moderate, severe)     No pain  4. CAUSE: What do you think is causing the symptoms?     Not sure has been  5. OTHER SYMPTOMS: Do you have any other symptoms? (e.g., blood in urine, fever, flank pain, pain with urination)     Urinary  frequency  6. PREGNANCY: Is  there any chance you are pregnant? When was your last menstrual period?     na  Protocols used: Urinary Symptoms-A-AH

## 2024-06-02 NOTE — Telephone Encounter (Signed)
 I called [name] and scheduled patient for an appointment today at 2:40 with Ngetich, Dinah C, NP

## 2024-06-02 NOTE — Telephone Encounter (Signed)
 06/02/2024 please advise....  Copied from CRM #[id number]. Topic: Referral - Question >> Jun 02, 2024  8:39 AM Merlynn A wrote: Reason for CRM: Patients son called in advising that patient is already seeing Orthopedic Surgeon at [facility] on [address]. Patient prefers to go to same ortho she is already seeing to avoid insurance issues as the insurance is not going to cover seeing two separate orthos. Please contact patient to advise what next steps are and if the referral could be sent to where she is already going. Patient can be reached at [phone].

## 2024-06-02 NOTE — Telephone Encounter (Signed)
Concerns addressed during visit today

## 2024-06-04 ENCOUNTER — Ambulatory Visit: Payer: Self-pay

## 2024-06-04 DIAGNOSIS — R300 Dysuria: Secondary | ICD-10-CM

## 2024-06-04 MED ORDER — SACCHAROMYCES BOULARDII 250 MG PO CAPS: 250.0000 mg | ORAL_CAPSULE | Freq: Two times a day (BID) | ORAL | 0 refills | Status: AC

## 2024-06-04 MED ORDER — SACCHAROMYCES BOULARDII 250 MG PO CAPS: 250.0000 mg | ORAL_CAPSULE | Freq: Two times a day (BID) | ORAL | 0 refills | Status: DC

## 2024-06-04 MED ORDER — CIPROFLOXACIN HCL 500 MG PO TABS: 500.0000 mg | ORAL_TABLET | Freq: Two times a day (BID) | ORAL | 0 refills | Status: DC

## 2024-06-04 MED ORDER — CIPROFLOXACIN HCL 500 MG PO TABS: 500.0000 mg | ORAL_TABLET | Freq: Two times a day (BID) | ORAL | 0 refills | Status: AC

## 2024-06-04 NOTE — Telephone Encounter (Signed)
 Medications are pended for approval

## 2024-06-04 NOTE — Telephone Encounter (Signed)
 Patient was seen on 06/02/2024. Pt is requesting antibiotic due to the pain getting much worse since pts visit. Pt UC has not came back yet. Routing to patients pcp

## 2024-06-04 NOTE — Addendum Note (Signed)
 Addended byBETHA LEONARDA BURDOCK C on: 06/04/2024 03:58 PM   Modules accepted: Orders

## 2024-06-04 NOTE — Telephone Encounter (Signed)
 FYI Only or Action Required?: Action required by provider: Antibiotic Request.  Patient was last seen in primary care on 06/02/2024 by Ngetich, Roxan BROCKS, NP.  Called Nurse Triage reporting Urinary Frequency.  Symptoms began several days ago.  Interventions attempted: Nothing.  Symptoms are: gradually worsening.  Triage Disposition: See Physician Within 24 Hours  Patient/caregiver understands and will follow disposition?: Yes Reason for Disposition  Urinating more frequently than usual (i.e., frequency) OR new-onset of the feeling of an urgent need to urinate (i.e., urgency)  Answer Assessment - Initial Assessment Questions [name], patient's son calling on behalf of patient. Had breakfast this morning, she threw up on herself a bit, unresponsive , took about 15 minutes to wake her up, this happened last week and they called 911, EMS evaluated and everything was fine. Urine sample given yesterday morning, Stated she was evaluated at her office visit on 9/16. Requesting an antibiotic be sent to [facility] on file while waiting for urine culture result, feels as if patients UTI is worsening. Call back [phone]   1. SYMPTOM: What's the main symptom you're concerned about? (e.g., frequency, incontinence)     Urinary frequency, has dementia but noticed a difference in confusion.  2. ONSET: When did the symptoms start?     Over the weekend   3. PAIN: Is there any pain? If Yes, ask: How bad is it? (Scale: 1-10; mild, moderate, severe)     Patient will not give a straight answer, either a not bad, or not really  4. CAUSE: What do you think is causing the symptoms?     UTI  5. OTHER SYMPTOMS: Do you have any other symptoms? (e.g., blood in urine, fever, flank pain, pain with urination)     Urinary frequency, confusion  Protocols used: Urinary Symptoms-A-AH  Copied from CRM #[id number]. Topic: Clinical - Red Word Triage >> Jun 04, 2024 12:24 PM Carrielelia G  wrote: Red Word that prompted transfer to Nurse Triage: Question is she passed because she would not wake up. Question if her UTI is getting worse too.

## 2024-06-04 NOTE — Addendum Note (Signed)
 Addended by: ELNOR FOY T on: 06/04/2024 01:30 PM   Modules accepted: Orders

## 2024-06-04 NOTE — Telephone Encounter (Signed)
Start on Cipro 500 mg tablet one by mouth twice daily x 7 days Take along with probiotics Florastor 250 mg capsule one by mouth twice daily x 10 days to prevent antibiotics associated diarrhea

## 2024-06-05 ENCOUNTER — Ambulatory Visit: Payer: Self-pay | Admitting: Family

## 2024-06-05 DIAGNOSIS — N3000 Acute cystitis without hematuria: Secondary | ICD-10-CM

## 2024-06-05 LAB — URINE CULTURE
MICRO NUMBER:: 16981658
SPECIMEN QUALITY:: ADEQUATE

## 2024-06-05 MED ORDER — NITROFURANTOIN MONOHYD MACRO 100 MG PO CAPS: 100.0000 mg | ORAL_CAPSULE | Freq: Two times a day (BID) | ORAL | 0 refills | Status: AC

## 2024-06-09 DIAGNOSIS — G8929 Other chronic pain: Secondary | ICD-10-CM | POA: Insufficient documentation

## 2024-06-09 NOTE — Progress Notes (Signed)
 Provider: Roxan Plough FNP-C  Gilda Abboud, Roxan BROCKS, NP  Patient Care Team: Fredrik Mogel, Roxan BROCKS, NP as PCP - General (Family Medicine) Cyrus Carwin, MD as Consulting Physician (Ophthalmology)  Extended Emergency Contact Information Primary Emergency Contact: Dripps,[name] Address: [address]          [place], [place] [address] [place] Home Phone: [phone] Relation: Son Secondary Emergency Contact: Gabrys,[name] Address: [address] [place], [place] [address] [place] Home Phone: [phone] Relation: Other  Code Status:  Full Code  Goals of care: Advanced Directive information    05/21/2024   11:51 AM  Advanced Directives  Does Patient Have a Medical Advance Directive? Yes  Type of Advance Directive Healthcare Power of Attorney  Does patient want to make changes to medical advance directive? No - Patient declined  Copy of Healthcare Power of Attorney in Chart? Yes - validated most recent copy scanned in chart (See row information)     Chief Complaint  Patient presents with   Urinary Concerns    Discussed the use of AI scribe software for clinical note transcription with the patient, who gave verbal consent to proceed.  History of Present Illness   Amy Andrade is an 88 year old female who presents with frequent urination and lethargy.  She experiences frequent urination, having gone 'seven times in a row' recently. Her husband notes she has been using a new cup with a handle and straw, which she clears regularly. There is concern about a possible urinary tract infection (UTI).  She has been feeling lethargic since her last COVID-19 infection and has not fully recovered. No pain in the abdomen or back, although she mentions discomfort when sitting in certain chairs, which may affect her back. Her appetite is good, and she has experienced weight loss from 182 pounds to 179 pounds since her illness. She uses a  walker at home and reports no confusion beyond her usual state.  She is currently taking amlodipine , Avapro , and pravastatin  for blood pressure and cholesterol management. She also has nitroglycerin  available. Her husband requests six-month refills for her medications instead of the usual three-month supply.    Past Medical History:  Diagnosis Date   Alzheimer disease (HCC)    Asthma    Cancer (HCC)    Hyperlipidemia    Hypertension    Pulmonary embolism (HCC) 2004   was short of breath--was on coumadin for 6 mos to a year   Past Surgical History:  Procedure Laterality Date   ABDOMINAL HYSTERECTOMY     BUNIONECTOMY     CATARACT EXTRACTION Bilateral 06/17/2022   REPLACEMENT TOTAL KNEE  1980s, 2003   x2    No Known Allergies  Outpatient Encounter Medications as of 06/02/2024  Medication Sig   aspirin  81 MG tablet Take 81 mg by mouth daily.   CALCIUM-VITAMIN D PO Take 1 tablet by mouth 2 (two) times daily.   HYDROcodone -acetaminophen  (NORCO/VICODIN) 5-325 MG tablet Take 1 tablet by mouth every 12 (twelve) hours as needed for severe pain (pain score 7-10).   methocarbamol  (ROBAXIN ) 500 MG tablet Take 1 tablet by mouth once daily   nitroGLYCERIN  (NITROSTAT ) 0.4 MG SL tablet Place 1 tablet (0.4 mg total) under the tongue every 5 (five) minutes x 3 doses as needed for chest pain.   nystatin  cream (MYCOSTATIN ) Apply 1 application  topically 2 (two)  times daily. (Patient taking differently: Apply 1 application  topically as needed.)   triamcinolone  cream (KENALOG ) 0.1 % Apply 1 Application topically 2 (two) times daily. Affected area on right foot (Patient taking differently: Apply 1 Application topically as needed. Affected area on right foot)   [DISCONTINUED] amLODipine  (NORVASC ) 10 MG tablet Take 1 tablet by mouth once daily   [DISCONTINUED] irbesartan  (AVAPRO ) 300 MG tablet Take 1 tablet by mouth once daily   [DISCONTINUED] pravastatin  (PRAVACHOL ) 40 MG tablet Take 1 tablet by mouth  once daily   amLODipine  (NORVASC ) 10 MG tablet Take 1 tablet (10 mg total) by mouth daily.   irbesartan  (AVAPRO ) 300 MG tablet Take 1 tablet (300 mg total) by mouth daily.   pravastatin  (PRAVACHOL ) 40 MG tablet Take 1 tablet (40 mg total) by mouth daily.   No facility-administered encounter medications on file as of 06/02/2024.    Review of Systems  Constitutional:  Negative for appetite change, chills, fatigue, fever and unexpected weight change.       Chronic fatigue   HENT:  Negative for congestion, ear discharge, ear pain, hearing loss, nosebleeds, postnasal drip, rhinorrhea, sinus pressure, sinus pain, sneezing, sore throat and tinnitus.   Eyes:  Negative for pain, discharge, redness, itching and visual disturbance.  Respiratory:  Negative for cough, chest tightness, shortness of breath and wheezing.   Cardiovascular:  Negative for chest pain, palpitations and leg swelling.  Gastrointestinal:  Negative for abdominal distention, abdominal pain, constipation, diarrhea, nausea and vomiting.  Genitourinary:  Positive for frequency. Negative for difficulty urinating, dysuria, flank pain and urgency.  Musculoskeletal:  Positive for arthralgias. Negative for back pain, gait problem, joint swelling, myalgias, neck pain and neck stiffness.       Bilateral chronic shoulder pain   Skin:  Negative for color change, pallor, rash and wound.  Neurological:  Negative for dizziness, weakness, light-headedness, numbness and headaches.  Psychiatric/Behavioral:  Negative for agitation, behavioral problems, confusion, hallucinations, self-injury, sleep disturbance and suicidal ideas. The patient is not nervous/anxious.     Immunization History  Administered Date(s) Administered   Fluad Quad(high Dose 65+) 08/30/2021, 09/03/2022   INFLUENZA, HIGH DOSE SEASONAL PF 10/27/2018, 07/18/2020, 05/21/2024   Influenza,inj,Quad PF,6+ Mos 05/27/2015, 06/18/2016   Influenza-Unspecified 05/18/2017   Pneumococcal  Conjugate-13 10/11/2014   Pneumococcal Polysaccharide-23 01/20/2016   Pneumococcal-Unspecified 09/17/2020   Tdap 09/17/2010, 03/05/2023   Zoster Recombinant(Shingrix) 09/17/2020   Zoster, Live 07/22/2014   Pertinent  Health Maintenance Due  Topic Date Due   Influenza Vaccine  Completed   DEXA SCAN  Completed      11/30/2022    1:29 PM 03/05/2023    8:31 AM 03/08/2023    8:27 AM 12/06/2023    1:04 PM 05/21/2024   11:50 AM  Fall Risk  Falls in the past year? 0 0 0 0 1  Was there an injury with Fall? 0 0 0 0 1  Fall Risk Category Calculator 0 0 0 0 2  Patient at Risk for Falls Due to No Fall Risks No Fall Risks No Fall Risks No Fall Risks No Fall Risks  Fall risk Follow up Falls evaluation completed;Education provided;Falls prevention discussed Falls evaluation completed Falls evaluation completed Falls evaluation completed Falls evaluation completed   Functional Status Survey:    Vitals:   06/02/24 1450  BP: 130/72  Pulse: 68  Resp: 18  Temp: 97.6 F (36.4 C)  SpO2: 96%  Weight: 179 lb (81.2 kg)  Height: 5' 10 (1.778 m)   Body  mass index is 25.68 kg/m. Physical Exam  VITALS: T- 97.6, P- 68, BP- 130/72, SaO2- 96% MEASUREMENTS: Weight- 179. GENERAL: Alert, cooperative, well developed, no acute distress. HEENT: Normocephalic, normal oropharynx, moist mucous membranes. CHEST: Clear to auscultation bilaterally, no wheezes, rhonchi, or crackles. CARDIOVASCULAR: Normal heart rate and rhythm, S1 and S2 normal without murmurs. ABDOMEN: Soft, non-tender, non-distended, without organomegaly, normal bowel sounds. EXTREMITIES: No cyanosis or edema. NEUROLOGICAL: Cranial nerves grossly intact, moves all extremities without gross motor or sensory deficit.   Labs reviewed: Recent Labs    02/25/24 0809  NA 131*  K 4.7  CL 97*  CO2 26  GLUCOSE 91  BUN 10  CREATININE 0.71  CALCIUM 9.2   Recent Labs    02/25/24 0809  AST 11  ALT 7  BILITOT 0.5  PROT 6.0*   Recent Labs     02/25/24 0809  WBC 5.3  NEUTROABS 3,000  HGB 9.9*  HCT 31.9*  MCV 90.9  PLT 375   Lab Results  Component Value Date   TSH 1.60 02/25/2024   Lab Results  Component Value Date   HGBA1C 5.3 03/08/2021   Lab Results  Component Value Date   CHOL 179 02/25/2024   HDL 101 02/25/2024   LDLCALC 65 02/25/2024   TRIG 54 02/25/2024   CHOLHDL 1.8 02/25/2024    Significant Diagnostic Results in last 30 days:  DG Shoulder Right Result Date: 05/22/2024 CLINICAL DATA:  Additional rib pain and swelling for 5 days after loss of consciousness with fall and year EXAM: RIGHT SHOULDER - 2+ VIEW COMPARISON:  None Available. FINDINGS: Suboptimal positioning with internal rotation of the right upper extremity. There is questionable anterior subluxation of the right humeral head at the glenohumeral joint, although this may be artifactual due to positioning. No fracture. No evidence of acromioclavicular joint separation. Probable degenerative changes at the glenohumeral joint, poorly evaluated. No radiopaque foreign body or pathologic soft tissue calcifications. IMPRESSION: Suboptimal positioning with internal rotation of the right upper extremity. Questionable anterior subluxation of the right humeral head at the glenohumeral joint, although this may be artifactual due to positioning. No fracture. Consider either repeat right shoulder radiographs with improved positioning or further evaluation with noncontrast right shoulder CT. Electronically Signed   By: Selinda DELENA Blue M.D.   On: 05/22/2024 11:42   DG Humerus Right Result Date: 05/22/2024 CLINICAL DATA:  arm swelling and pain EXAM: RIGHT HUMERUS - 2+ VIEW COMPARISON:  None Available. FINDINGS: There is no evidence of fracture or other focal bone lesions. Soft tissues are unremarkable. IMPRESSION: No right humerus fracture. Electronically Signed   By: Selinda DELENA Blue M.D.   On: 05/22/2024 11:29    Assessment/Plan  Frequency of micturition and evaluation for  urinary tract infection Increased frequency of urination without pain or discomfort. Differential diagnosis includes UTI, especially given family history of UTI complications. No fever or abdominal pain reported. - Provide urine specimen for urinalysis to evaluate for UTI - Instruct on proper urine collection technique to avoid contamination  Fatigue Persistent fatigue since previous COVID-19 infection. No new symptoms such as confusion or changes in appetite. Weight loss likely related to previous illness.  Right shoulder pain Right shoulder pain requiring evaluation and management. Plan to coordinate care with Dr. Armida to synchronize treatment with left shoulder management. - Refer to Dr. Armida for evaluation and management of right shoulder pain - Coordinate steroid injections for both shoulders to occur simultaneously  Left shoulder pain Chronic left shoulder pain managed by  Dr. Armida with periodic steroid injections every three months. - Continue follow-up with Dr. Armida for left shoulder management  Hypertension Blood pressure well-controlled at 130/72 mmHg. Current medications include amlodipine  and Avapro . - Continue current antihypertensive regimen - Provide six-month prescription refills for antihypertensive medications  Hyperlipidemia Cholesterol management with pravastatin . - Continue pravastatin  - Provide six-month prescription refills for pravastatin            Family/ staff Communication: Reviewed plan of care with patient  Labs/tests ordered: None   Next Appointment: Return in about 3 months (around 09/01/2024) for medical mangement of chronic issues..   Total time: minutes. Greater than 50% of total time spent doing patient education regarding T2DM,HTN, HLD,chronic back pain,health maintenance including symptom/medication management.   Roxan JAYSON Plough, NP

## 2024-06-22 ENCOUNTER — Telehealth: Payer: Self-pay

## 2024-06-22 DIAGNOSIS — S72402S Unspecified fracture of lower end of left femur, sequela: Secondary | ICD-10-CM

## 2024-06-22 DIAGNOSIS — G8929 Other chronic pain: Secondary | ICD-10-CM

## 2024-06-22 MED ORDER — HYDROCODONE-ACETAMINOPHEN 5-325 MG PO TABS: 1.0000 | ORAL_TABLET | Freq: Two times a day (BID) | ORAL | 0 refills | Status: DC | PRN

## 2024-06-22 NOTE — Telephone Encounter (Signed)
 Incoming fax received from patients pharmacy to initiate a prior authorization for                   .  PA initiated through covermymeds. Key: [id number]  Awaiting reply from the insurance company which will be determined in 48-72 hours.

## 2024-06-22 NOTE — Telephone Encounter (Signed)
 Patients son is aware rx sent

## 2024-06-22 NOTE — Telephone Encounter (Signed)
 Patient is requesting a refill of the following medications: Requested Prescriptions   Pending Prescriptions Disp Refills   HYDROcodone -acetaminophen  (NORCO/VICODIN) 5-325 MG tablet 30 tablet 0    Sig: Take 1 tablet by mouth every 12 (twelve) hours as needed for severe pain (pain score 7-10).    Date of last refill: 09/04/23  Refill amount: 30  Treatment agreement date: 08/22/23  Next Appointment 09/02/24

## 2024-06-24 DIAGNOSIS — M12811 Other specific arthropathies, not elsewhere classified, right shoulder: Secondary | ICD-10-CM | POA: Diagnosis not present

## 2024-06-24 DIAGNOSIS — M12812 Other specific arthropathies, not elsewhere classified, left shoulder: Secondary | ICD-10-CM | POA: Diagnosis not present

## 2024-06-24 NOTE — Progress Notes (Signed)
 Amy Andrade is in today as a return patient.  It has been more than 58-month since I seen her.  Rotator cuff arthropathy of both shoulders the left shoulder chronically dislocated.  She is not a surgical candidate secondary to age and health.  She responds well to cortisone injections.  She is in today for follow-up.  On examination 88 year old female no acute distress.  She is alert and oriented x 3.  Exam of the head is normocephalic atraumatic.  Neck is supple no meningismus.  Musculoskeletal exam of the right shoulder crepitus strength range of motion decreased due to pain, left shoulder crepitus strength range of motion decreased due to pain.  Skin is warm dry intact no signs of infection.  Impression: Rotator cuff arthropathy bilateral shoulders.  We discussed rest ice activity modification.  Will repeat injections today and follow-up with her as needed   Large joint arthrocentesis: bilateral subacromial bursa on 06/24/2024 2:20 PMDetails: 21 G needle, posterior approach Medications (Right): 8 mL BUPivacaine  HCl 0.5 % (5 mg/mL); 80 mg triamcinolone  acetonide 40 mg/mL Medications (Left): 8 mL BUPivacaine  HCl 0.5 % (5 mg/mL); 80 mg triamcinolone  acetonide 40 mg/mL Procedure, treatment alternatives, risks and benefits explained, specific risks discussed. Consent was given by the patient. Patient was prepped and draped in the usual sterile fashion.

## 2024-06-28 ENCOUNTER — Emergency Department (HOSPITAL_COMMUNITY)

## 2024-06-28 ENCOUNTER — Other Ambulatory Visit: Payer: Self-pay

## 2024-06-28 ENCOUNTER — Inpatient Hospital Stay (HOSPITAL_COMMUNITY)
Admission: EM | Admit: 2024-06-28 | Discharge: 2024-07-10 | DRG: 330 | Disposition: A | Discharge status: Skilled Nursing Facility | Attending: Internal Medicine | Admitting: Internal Medicine

## 2024-06-28 ENCOUNTER — Encounter (HOSPITAL_COMMUNITY): Payer: Self-pay | Admitting: Internal Medicine

## 2024-06-28 DIAGNOSIS — E538 Deficiency of other specified B group vitamins: Secondary | ICD-10-CM | POA: Diagnosis present

## 2024-06-28 DIAGNOSIS — M47812 Spondylosis without myelopathy or radiculopathy, cervical region: Secondary | ICD-10-CM | POA: Diagnosis not present

## 2024-06-28 DIAGNOSIS — K449 Diaphragmatic hernia without obstruction or gangrene: Secondary | ICD-10-CM | POA: Diagnosis present

## 2024-06-28 DIAGNOSIS — S3993XA Unspecified injury of pelvis, initial encounter: Secondary | ICD-10-CM | POA: Diagnosis not present

## 2024-06-28 DIAGNOSIS — M40203 Unspecified kyphosis, cervicothoracic region: Secondary | ICD-10-CM | POA: Diagnosis present

## 2024-06-28 DIAGNOSIS — M4807 Spinal stenosis, lumbosacral region: Secondary | ICD-10-CM | POA: Diagnosis not present

## 2024-06-28 DIAGNOSIS — M47815 Spondylosis without myelopathy or radiculopathy, thoracolumbar region: Secondary | ICD-10-CM | POA: Diagnosis present

## 2024-06-28 DIAGNOSIS — G8929 Other chronic pain: Secondary | ICD-10-CM | POA: Diagnosis present

## 2024-06-28 DIAGNOSIS — K922 Gastrointestinal hemorrhage, unspecified: Secondary | ICD-10-CM | POA: Diagnosis present

## 2024-06-28 DIAGNOSIS — F0280 Dementia in other diseases classified elsewhere without behavioral disturbance: Secondary | ICD-10-CM | POA: Diagnosis present

## 2024-06-28 DIAGNOSIS — C189 Malignant neoplasm of colon, unspecified: Secondary | ICD-10-CM | POA: Insufficient documentation

## 2024-06-28 DIAGNOSIS — Z820 Family history of epilepsy and other diseases of the nervous system: Secondary | ICD-10-CM

## 2024-06-28 DIAGNOSIS — M40204 Unspecified kyphosis, thoracic region: Secondary | ICD-10-CM | POA: Diagnosis not present

## 2024-06-28 DIAGNOSIS — E871 Hypo-osmolality and hyponatremia: Secondary | ICD-10-CM | POA: Diagnosis present

## 2024-06-28 DIAGNOSIS — C182 Malignant neoplasm of ascending colon: Secondary | ICD-10-CM | POA: Diagnosis not present

## 2024-06-28 DIAGNOSIS — K5900 Constipation, unspecified: Secondary | ICD-10-CM | POA: Diagnosis present

## 2024-06-28 DIAGNOSIS — M24412 Recurrent dislocation, left shoulder: Secondary | ICD-10-CM | POA: Diagnosis present

## 2024-06-28 DIAGNOSIS — Z87891 Personal history of nicotine dependence: Secondary | ICD-10-CM

## 2024-06-28 DIAGNOSIS — D630 Anemia in neoplastic disease: Secondary | ICD-10-CM | POA: Diagnosis present

## 2024-06-28 DIAGNOSIS — D649 Anemia, unspecified: Principal | ICD-10-CM | POA: Diagnosis present

## 2024-06-28 DIAGNOSIS — Z9841 Cataract extraction status, right eye: Secondary | ICD-10-CM

## 2024-06-28 DIAGNOSIS — M47816 Spondylosis without myelopathy or radiculopathy, lumbar region: Secondary | ICD-10-CM | POA: Diagnosis not present

## 2024-06-28 DIAGNOSIS — Z8249 Family history of ischemic heart disease and other diseases of the circulatory system: Secondary | ICD-10-CM

## 2024-06-28 DIAGNOSIS — N179 Acute kidney failure, unspecified: Secondary | ICD-10-CM | POA: Diagnosis present

## 2024-06-28 DIAGNOSIS — S3991XA Unspecified injury of abdomen, initial encounter: Secondary | ICD-10-CM | POA: Diagnosis not present

## 2024-06-28 DIAGNOSIS — G309 Alzheimer's disease, unspecified: Secondary | ICD-10-CM | POA: Diagnosis present

## 2024-06-28 DIAGNOSIS — Z8052 Family history of malignant neoplasm of bladder: Secondary | ICD-10-CM

## 2024-06-28 DIAGNOSIS — M545 Low back pain, unspecified: Secondary | ICD-10-CM | POA: Diagnosis not present

## 2024-06-28 DIAGNOSIS — Z825 Family history of asthma and other chronic lower respiratory diseases: Secondary | ICD-10-CM

## 2024-06-28 DIAGNOSIS — S199XXA Unspecified injury of neck, initial encounter: Secondary | ICD-10-CM | POA: Diagnosis not present

## 2024-06-28 DIAGNOSIS — Z86711 Personal history of pulmonary embolism: Secondary | ICD-10-CM

## 2024-06-28 DIAGNOSIS — M19011 Primary osteoarthritis, right shoulder: Secondary | ICD-10-CM | POA: Diagnosis present

## 2024-06-28 DIAGNOSIS — M47814 Spondylosis without myelopathy or radiculopathy, thoracic region: Secondary | ICD-10-CM | POA: Diagnosis not present

## 2024-06-28 DIAGNOSIS — D62 Acute posthemorrhagic anemia: Secondary | ICD-10-CM | POA: Diagnosis present

## 2024-06-28 DIAGNOSIS — S0990XA Unspecified injury of head, initial encounter: Secondary | ICD-10-CM | POA: Diagnosis not present

## 2024-06-28 DIAGNOSIS — D509 Iron deficiency anemia, unspecified: Secondary | ICD-10-CM | POA: Diagnosis present

## 2024-06-28 DIAGNOSIS — M48061 Spinal stenosis, lumbar region without neurogenic claudication: Secondary | ICD-10-CM | POA: Diagnosis not present

## 2024-06-28 DIAGNOSIS — I10 Essential (primary) hypertension: Secondary | ICD-10-CM | POA: Diagnosis present

## 2024-06-28 DIAGNOSIS — S299XXA Unspecified injury of thorax, initial encounter: Secondary | ICD-10-CM | POA: Diagnosis not present

## 2024-06-28 DIAGNOSIS — I700 Atherosclerosis of aorta: Secondary | ICD-10-CM | POA: Diagnosis present

## 2024-06-28 DIAGNOSIS — Z96653 Presence of artificial knee joint, bilateral: Secondary | ICD-10-CM | POA: Diagnosis present

## 2024-06-28 DIAGNOSIS — E785 Hyperlipidemia, unspecified: Secondary | ICD-10-CM | POA: Diagnosis present

## 2024-06-28 DIAGNOSIS — D123 Benign neoplasm of transverse colon: Secondary | ICD-10-CM | POA: Diagnosis present

## 2024-06-28 DIAGNOSIS — Z7982 Long term (current) use of aspirin: Secondary | ICD-10-CM

## 2024-06-28 DIAGNOSIS — J45909 Unspecified asthma, uncomplicated: Secondary | ICD-10-CM | POA: Diagnosis present

## 2024-06-28 DIAGNOSIS — N390 Urinary tract infection, site not specified: Secondary | ICD-10-CM | POA: Diagnosis not present

## 2024-06-28 DIAGNOSIS — Z90710 Acquired absence of both cervix and uterus: Secondary | ICD-10-CM

## 2024-06-28 DIAGNOSIS — Z9842 Cataract extraction status, left eye: Secondary | ICD-10-CM

## 2024-06-28 DIAGNOSIS — M19012 Primary osteoarthritis, left shoulder: Secondary | ICD-10-CM | POA: Diagnosis present

## 2024-06-28 DIAGNOSIS — Z87440 Personal history of urinary (tract) infections: Secondary | ICD-10-CM

## 2024-06-28 DIAGNOSIS — I951 Orthostatic hypotension: Secondary | ICD-10-CM | POA: Diagnosis present

## 2024-06-28 LAB — CBC WITH DIFFERENTIAL/PLATELET
Abs Immature Granulocytes: 0.04 K/uL (ref 0.00–0.07)
Basophils Absolute: 0 K/uL (ref 0.0–0.1)
Basophils Relative: 0 %
Eosinophils Absolute: 0 K/uL (ref 0.0–0.5)
Eosinophils Relative: 0 %
HCT: 24 % — ABNORMAL LOW (ref 36.0–46.0)
Hemoglobin: 6.9 g/dL — CL (ref 12.0–15.0)
Immature Granulocytes: 1 %
Lymphocytes Relative: 18 %
Lymphs Abs: 1.3 K/uL (ref 0.7–4.0)
MCH: 21.1 pg — ABNORMAL LOW (ref 26.0–34.0)
MCHC: 28.8 g/dL — ABNORMAL LOW (ref 30.0–36.0)
MCV: 73.4 fL — ABNORMAL LOW (ref 80.0–100.0)
Monocytes Absolute: 0.8 K/uL (ref 0.1–1.0)
Monocytes Relative: 10 %
Neutro Abs: 5.5 K/uL (ref 1.7–7.7)
Neutrophils Relative %: 71 %
Platelets: 471 K/uL — ABNORMAL HIGH (ref 150–400)
RBC: 3.27 MIL/uL — ABNORMAL LOW (ref 3.87–5.11)
RDW: 18.4 % — ABNORMAL HIGH (ref 11.5–15.5)
WBC: 7.6 K/uL (ref 4.0–10.5)
nRBC: 0.7 % — ABNORMAL HIGH (ref 0.0–0.2)

## 2024-06-28 LAB — URINALYSIS, W/ REFLEX TO CULTURE (INFECTION SUSPECTED)
Bilirubin Urine: NEGATIVE
Glucose, UA: NEGATIVE mg/dL
Hgb urine dipstick: NEGATIVE
Ketones, ur: NEGATIVE mg/dL
Nitrite: NEGATIVE
Protein, ur: NEGATIVE mg/dL
Specific Gravity, Urine: 1.017 (ref 1.005–1.030)
pH: 5 (ref 5.0–8.0)

## 2024-06-28 LAB — COMPREHENSIVE METABOLIC PANEL WITH GFR
ALT: 10 U/L (ref 0–44)
AST: 18 U/L (ref 15–41)
Albumin: 4.1 g/dL (ref 3.5–5.0)
Alkaline Phosphatase: 68 U/L (ref 38–126)
Anion gap: 11 (ref 5–15)
BUN: 23 mg/dL (ref 8–23)
CO2: 23 mmol/L (ref 22–32)
Calcium: 9.6 mg/dL (ref 8.9–10.3)
Chloride: 97 mmol/L — ABNORMAL LOW (ref 98–111)
Creatinine, Ser: 1.09 mg/dL — ABNORMAL HIGH (ref 0.44–1.00)
GFR, Estimated: 49 mL/min — ABNORMAL LOW (ref >60)
Glucose, Bld: 108 mg/dL — ABNORMAL HIGH (ref 70–99)
Potassium: 4.4 mmol/L (ref 3.5–5.1)
Sodium: 131 mmol/L — ABNORMAL LOW (ref 135–145)
Total Bilirubin: 0.6 mg/dL (ref 0.0–1.2)
Total Protein: 6.7 g/dL (ref 6.5–8.1)

## 2024-06-28 LAB — VITAMIN B12: Vitamin B-12: <150 pg/mL — ABNORMAL LOW (ref 180–914)

## 2024-06-28 LAB — PROTIME-INR
INR: 1 (ref 0.8–1.2)
Prothrombin Time: 13.6 s (ref 11.4–15.2)

## 2024-06-28 LAB — IRON AND TIBC
Iron: 57 ug/dL (ref 28–170)
Saturation Ratios: 14 % (ref 10.4–31.8)
TIBC: 395 ug/dL (ref 250–450)
UIBC: 338 ug/dL

## 2024-06-28 LAB — POC OCCULT BLOOD, ED: Fecal Occult Bld: POSITIVE — AB

## 2024-06-28 LAB — TROPONIN T, HIGH SENSITIVITY
Troponin T High Sensitivity: 27 ng/L — ABNORMAL HIGH (ref 0–19)
Troponin T High Sensitivity: 23 ng/L — ABNORMAL HIGH (ref 0–19)

## 2024-06-28 LAB — PREPARE RBC (CROSSMATCH)

## 2024-06-28 LAB — MAGNESIUM: Magnesium: 2.5 mg/dL — ABNORMAL HIGH (ref 1.7–2.4)

## 2024-06-28 LAB — ABO/RH: ABO/RH(D): AB POS

## 2024-06-28 LAB — FERRITIN: Ferritin: 21 ng/mL (ref 11–307)

## 2024-06-28 LAB — FOLATE: Folate: 10.4 ng/mL (ref >5.9)

## 2024-06-28 MED ORDER — LACTATED RINGERS IV BOLUS
500.0000 mL | Freq: Once | INTRAVENOUS | Status: AC
  Administered 2024-06-28: 500 mL via INTRAVENOUS

## 2024-06-28 MED ORDER — PANTOPRAZOLE SODIUM 40 MG IV SOLR
40.0000 mg | Freq: Two times a day (BID) | INTRAVENOUS | Status: DC
Start: 2024-06-28 — End: 2024-07-05
  Administered 2024-06-28 – 2024-07-04 (×13): 40 mg via INTRAVENOUS
  Filled 2024-06-28 (×15): qty 10

## 2024-06-28 MED ORDER — ACETAMINOPHEN 650 MG RE SUPP: 650.0000 mg | Freq: Four times a day (QID) | RECTAL | Status: DC | PRN

## 2024-06-28 MED ORDER — ONDANSETRON HCL 4 MG/2ML IJ SOLN
4.0000 mg | Freq: Four times a day (QID) | INTRAMUSCULAR | Status: DC | PRN
Start: 2024-06-28 — End: 2024-07-10
  Administered 2024-07-02: 4 mg via INTRAVENOUS
  Filled 2024-06-28: qty 2

## 2024-06-28 MED ORDER — SODIUM CHLORIDE 0.9% IV SOLUTION: Freq: Once | INTRAVENOUS | Status: AC

## 2024-06-28 MED ORDER — ACETAMINOPHEN 325 MG PO TABS
650.0000 mg | ORAL_TABLET | Freq: Four times a day (QID) | ORAL | Status: DC | PRN
  Administered 2024-06-28 – 2024-07-09 (×4): 650 mg via ORAL
  Filled 2024-06-28 (×5): qty 2

## 2024-06-28 MED ORDER — ONDANSETRON HCL 4 MG PO TABS: 4.0000 mg | ORAL_TABLET | Freq: Four times a day (QID) | ORAL | Status: DC | PRN

## 2024-06-28 MED ORDER — AMLODIPINE BESYLATE 10 MG PO TABS
10.0000 mg | ORAL_TABLET | Freq: Every day | ORAL | Status: DC
  Administered 2024-06-29 – 2024-07-07 (×9): 10 mg via ORAL
  Filled 2024-06-28: qty 1
  Filled 2024-06-28: qty 2
  Filled 2024-06-28 (×2): qty 1
  Filled 2024-06-28: qty 2
  Filled 2024-06-28 (×3): qty 1
  Filled 2024-06-28: qty 2

## 2024-06-28 MED ORDER — PRAVASTATIN SODIUM 40 MG PO TABS
40.0000 mg | ORAL_TABLET | Freq: Every day | ORAL | Status: AC
Start: 2024-06-29 — End: ?
  Administered 2024-06-29 – 2024-07-10 (×12): 40 mg via ORAL
  Filled 2024-06-28 (×2): qty 2
  Filled 2024-06-28 (×2): qty 1
  Filled 2024-06-28: qty 2
  Filled 2024-06-28: qty 1
  Filled 2024-06-28 (×5): qty 2
  Filled 2024-06-28: qty 1

## 2024-06-28 MED ORDER — SENNOSIDES-DOCUSATE SODIUM 8.6-50 MG PO TABS
1.0000 | ORAL_TABLET | Freq: Every evening | ORAL | Status: DC | PRN
  Administered 2024-07-01 – 2024-07-03 (×3): 1 via ORAL
  Filled 2024-06-28 (×3): qty 1

## 2024-06-28 MED ORDER — BISACODYL 5 MG PO TBEC
5.0000 mg | DELAYED_RELEASE_TABLET | Freq: Every day | ORAL | Status: DC | PRN
  Administered 2024-07-03: 5 mg via ORAL
  Filled 2024-06-28: qty 1

## 2024-06-28 NOTE — ED Notes (Signed)
 Assisted pt to bedside commode and back to bed. Emptied 100 ml of urine. Full linen change.

## 2024-06-28 NOTE — H&P (Signed)
 History and Physical    Amy Andrade FMW:[id number] DOB: [birth year] DOA: 06/28/2024  PCP: [name], NP  Patient coming from: Home  I have personally briefly reviewed patient's old medical records in [facility]  Chief Complaint: Generalized weakness  HPI: Amy Andrade is a 88 y.o. female with medical history significant for Alzheimer's dementia, HTN, HLD, chronic pain due to OA of both shoulders and chronic left shoulder dislocation who presented to the ED for evaluation of generalized weakness.  Patient reports several days of generalized weakness and fatigue.  Normally ambulates with use of a walker but family has had to provide significant assistance.  She notes frequent urinary urgency.  She was recently treated for UTI initially with ciprofloxacin .  Urine culture grew Enterococcus faecalis and antibiotics were switched to nitrofurantoin  which she has since completed.  She has had suprapubic discomfort.  Patient has not seen any obvious bleeding although family reported seeing some dark stool.  ED Course  Labs/Imaging on admission: I have personally reviewed following labs and imaging studies.  Initial vitals showed BP 133/57, pulse 80, RR 16, temp 98.3 F, SpO2 100% on room air.  Labs showed hemoglobin 6.9, hematocrit 24, MCV 73.4, platelets 471, WBC 7.6, sodium 131, potassium 4.4, bicarb 23, BUN 23, creatinine 1.09, serum glucose 108, LFTs within normal limits, troponin T 27 > 23.  FOBT positive.  Dark stool on DRE per EDP.  Urinalysis showed negative nitrites, large leukocytes, 0-5 RBCs, 11-20 WBCs, rare bacteria.  Urine culture in process.  CT head without contrast negative for acute intracranial abnormality.  Moderate generalized atrophy and white matter changes are similar to prior study.  CT cervical spine without contrast negative for acute abnormality.  Ankylosis across C2-C6 disc spaces noted.  CT thoracic spine negative for acute findings.   Exaggerated cervical thoracic kyphosis, rightward curvature centered at T11, ankylosis across T11-T12 and T12-L1 disc bases, remote superior endplate fracture at T8.  CT lumbar spine negative for acute findings.  Solid fusion at L4-5 and ankylosis of lower thoracic spine at L3 seen.  Chronic sclerotic changes at L3-4 with moderate central and right greater than left foraminal stenosis.  Moderate left foraminal stenosis at L5-S1.  CT chest/abdomen/pelvis without contrast showed multilevel degenerative changes in the thoracolumbar spine without evidence of acute fracture, moderate retained stool in the colon, small hiatal hernia.  No acute pathology.  Patient was given 5 1 cc LR, IV Protonix 40 mg, and 1 unit PRBC transfusion.  EDP spoke with GI Dr. Burnette who recommended medical admission and will see in consultation.  The hospitalist service was consulted for admission.  Review of Systems: All systems reviewed and are negative except as documented in history of present illness above.   Past Medical History:  Diagnosis Date   Alzheimer disease (HCC)    Asthma    Cancer (HCC)    Hyperlipidemia    Hypertension    Pulmonary embolism (HCC) 2004   was short of breath--was on coumadin for 6 mos to a year    Past Surgical History:  Procedure Laterality Date   ABDOMINAL HYSTERECTOMY     BUNIONECTOMY     CATARACT EXTRACTION Bilateral 06/17/2022   REPLACEMENT TOTAL KNEE  1980s, 2003   x2    Social History: Social History   Tobacco Use   Smoking status: Former    Current packs/day: 0.00    Average packs/day: 0.3 packs/day for 4.0 years (1.0 ttl pk-yrs)    Types: Cigarettes  Start date: 09/17/1953    Quit date: 09/17/1957    Years since quitting: 66.8   Smokeless tobacco: Never  Vaping Use   Vaping status: Never Used  Substance Use Topics   Alcohol use: Yes    Alcohol/week: 2.0 standard drinks of alcohol    Types: 2 Glasses of wine per week    Comment: 1 4oz cup with lunch and  dinner   Drug use: No   No Known Allergies  Family History  Problem Relation Age of Onset   Alzheimer's disease Mother    Chronic bronchitis Mother    Heart disease Father    High blood pressure Father    High blood pressure Brother    Asthma Daughter    High blood pressure Son    High Cholesterol Son    Post-traumatic stress disorder Son      Prior to Admission medications   Medication Sig Start Date End Date Taking? Authorizing Provider  amLODipine  (NORVASC ) 10 MG tablet Take 1 tablet (10 mg total) by mouth daily. 06/02/24   Ngetich, Dinah C, NP  aspirin  81 MG tablet Take 81 mg by mouth daily.    [provider]  CALCIUM-VITAMIN D PO Take 1 tablet by mouth 2 (two) times daily.    [provider]  HYDROcodone -acetaminophen  (NORCO/VICODIN) 5-325 MG tablet Take 1 tablet by mouth every 12 (twelve) hours as needed for severe pain (pain score 7-10). 06/22/24   Ngetich, Dinah C, NP  irbesartan  (AVAPRO ) 300 MG tablet Take 1 tablet (300 mg total) by mouth daily. 06/02/24   Ngetich, Dinah C, NP  methocarbamol  (ROBAXIN ) 500 MG tablet Take 1 tablet by mouth once daily 04/27/24   Ngetich, Dinah C, NP  nitroGLYCERIN  (NITROSTAT ) 0.4 MG SL tablet Place 1 tablet (0.4 mg total) under the tongue every 5 (five) minutes x 3 doses as needed for chest pain. 10/25/20   Medina-Vargas, Monina C, NP  nystatin  cream (MYCOSTATIN ) Apply 1 application  topically 2 (two) times daily. Patient taking differently: Apply 1 application  topically as needed. 02/28/22   Ngetich, Dinah C, NP  pravastatin  (PRAVACHOL ) 40 MG tablet Take 1 tablet (40 mg total) by mouth daily. 06/02/24   Ngetich, Dinah C, NP  triamcinolone  cream (KENALOG ) 0.1 % Apply 1 Application topically 2 (two) times daily. Affected area on right foot Patient taking differently: Apply 1 Application topically as needed. Affected area on right foot 03/05/23   NgetichRoxan BROCKS, NP    Physical Exam: Vitals:   06/28/24 1812 06/28/24 1815 06/28/24  1830 06/28/24 1845  BP: (!) 146/91 (!) 146/91 (!) 164/94   Pulse: 76 77 80 87  Resp: 18     Temp: 98.2 F (36.8 C)     TempSrc: Oral     SpO2: 100% 92% 100% 93%   Constitutional: Elderly woman resting in bed with head elevated Eyes: EOMI, lids and conjunctivae normal ENMT: Mucous membranes are moist. Posterior pharynx clear of any exudate or lesions.Normal dentition.  Neck: normal, supple, no masses. Respiratory: clear to auscultation bilaterally, no wheezing, no crackles. Normal respiratory effort. No accessory muscle use.  Cardiovascular: Regular rate and rhythm, no murmurs / rubs / gallops. No extremity edema. 2+ pedal pulses. Abdomen: Suprapubic tenderness, no masses palpated. Musculoskeletal: no clubbing / cyanosis.  Osteoarthritic changes bilateral hands. Skin: no rashes, lesions, ulcers. No induration Neurologic: Sensation intact. Strength equal bilaterally. Psychiatric: Alert and oriented x 3. Normal mood.  Exhibits short-term memory deficits.  EKG: Personally reviewed. Sinus rhythm,  rate 68, no acute ischemic changes.  Assessment/Plan Principal Problem:   Symptomatic anemia Active Problems:   Alzheimer disease (HCC)   Hyperlipidemia   Hypertension   Chronic hyponatremia   Amy Andrade is a 88 y.o. female with medical history significant for Alzheimer's dementia, HTN, HLD, chronic pain due to OA of both shoulders and chronic left shoulder dislocation who is admitted with symptomatic anemia.  Assessment and Plan: Symptomatic anemia: Suspect secondary to upper GI bleed.  FOBT is positive.  Hemoglobin 6.9 on admission compared to 9.9 this past June and previously 12.1 in June 2024.  No prior EGD on file. - Transfusing 1 unit PRBC - Repeat CBC in a.m., check anemia panel - Continue IV Protonix 40 mg twice daily - Hold aspirin  - N.p.o. after midnight - GI to consult  Hypertension: Continue amlodipine .  Hold irbesartan  given mildly increased creatinine from  baseline.  Hyperlipidemia: Continue pravastatin .  Chronic mild hyponatremia: Asymptomatic.  Continue monitor.  Urinary frequency/urgency: Recently treated for Enterococcus faecalis UTI.  UA with large leukocytes, 11-20 WBCs, rare bacteria.  Urine culture in process.  Alzheimer's dementia: Continue fall and delirium precautions.   DVT prophylaxis: SCDs Start: 06/28/24 1944 Code Status: Full code Family Communication: Discussed with patient, she has discussed with family Disposition Plan: From home, dispo pending clinical progress Consults called: GI Severity of Illness: The appropriate patient status for this patient is OBSERVATION. Observation status is judged to be reasonable and necessary in order to provide the required intensity of service to ensure the patient's safety. The patient's presenting symptoms, physical exam findings, and initial radiographic and laboratory data in the context of their medical condition is felt to place them at decreased risk for further clinical deterioration. Furthermore, it is anticipated that the patient will be medically stable for discharge from the hospital within 2 midnights of admission.   Jorie Blanch MD [facility]  If 7PM-7AM, please contact night-coverage [email or web address]  06/28/2024, 7:48 PM

## 2024-06-28 NOTE — ED Notes (Signed)
 Blood bank has 1 unit of blood ready for this patient.  Notified, Leita, RN.

## 2024-06-28 NOTE — ED Triage Notes (Signed)
 Recently treated UTI with symptom improvement, now symptoms have returned of lower back pain, polyuria but only drops come out. Hx dementia, son at bedside.

## 2024-06-28 NOTE — ED Provider Notes (Signed)
 [place] [facility] AT [facility] Provider Note   CSN: [id number] Arrival date & time: 06/28/24  1125     Patient presents with: Urinary Frequency   Amy Andrade is a 88 y.o. female.    Urinary Frequency  Patient presents for difficulty urinating and generalized weakness.  Medical history includes dementia, HTN, HLD, asthma, PE.  She was seen by PCP 4 weeks ago for urinary frequency.  She was started on ciprofloxacin .  Urine culture at that time grew Enterococcus faecalis.  Her antibiotics were switched to nitrofurantoin , twice daily for 7 days.  She completed this course of antibiotics.  She was doing well up until 3 to 4 days ago.  Over the past 3 days, she has had difficulty urinating.  This is described as feeling that she needs to urinate often but only being able to urinate a few drops at a time.  At baseline, she is able to ambulate with a walker.  For the past 3 days, she has had a progressive generalized weakness and is no longer able to ambulate.  She had unwitnessed falls at home.  She has been endorsing some lower back pain which preceded the falls.  She has not been complaining of any new areas of pain.  At baseline, she has chronic arthritis and rotator cuff injury in both shoulders which limits use of her upper extremities.  She has been at her mental baseline.     Prior to Admission medications   Medication Sig Start Date End Date Taking? Authorizing Provider  amLODipine  (NORVASC ) 10 MG tablet Take 1 tablet (10 mg total) by mouth daily. 06/02/24   Ngetich, Dinah C, NP  aspirin  81 MG tablet Take 81 mg by mouth daily.    [provider]  CALCIUM-VITAMIN D PO Take 1 tablet by mouth 2 (two) times daily.    [provider]  HYDROcodone -acetaminophen  (NORCO/VICODIN) 5-325 MG tablet Take 1 tablet by mouth every 12 (twelve) hours as needed for severe pain (pain score 7-10). 06/22/24   Ngetich, Dinah C, NP  irbesartan  (AVAPRO ) 300 MG  tablet Take 1 tablet (300 mg total) by mouth daily. 06/02/24   Ngetich, Dinah C, NP  methocarbamol  (ROBAXIN ) 500 MG tablet Take 1 tablet by mouth once daily 04/27/24   Ngetich, Dinah C, NP  nitroGLYCERIN  (NITROSTAT ) 0.4 MG SL tablet Place 1 tablet (0.4 mg total) under the tongue every 5 (five) minutes x 3 doses as needed for chest pain. 10/25/20   Medina-Vargas, Monina C, NP  nystatin  cream (MYCOSTATIN ) Apply 1 application  topically 2 (two) times daily. Patient taking differently: Apply 1 application  topically as needed. 02/28/22   Ngetich, Dinah C, NP  pravastatin  (PRAVACHOL ) 40 MG tablet Take 1 tablet (40 mg total) by mouth daily. 06/02/24   Ngetich, Dinah C, NP  triamcinolone  cream (KENALOG ) 0.1 % Apply 1 Application topically 2 (two) times daily. Affected area on right foot Patient taking differently: Apply 1 Application topically as needed. Affected area on right foot 03/05/23   Ngetich, Dinah C, NP    Allergies: Patient has no known allergies.    Review of Systems  Genitourinary:  Positive for decreased urine volume, difficulty urinating and frequency.  Musculoskeletal:  Positive for back pain.  Neurological:  Positive for weakness (Generalized).  All other systems reviewed and are negative.   Updated Vital Signs BP (!) 166/73 (BP Location: Right Arm)   Pulse 82   Temp (!) 97.5 F (36.4 C) (Oral)  Resp (!) 21   SpO2 100%   Physical Exam Vitals and nursing note reviewed.  Constitutional:      General: She is not in acute distress.    Appearance: Normal appearance. She is well-developed. She is not ill-appearing, toxic-appearing or diaphoretic.  HENT:     Head: Normocephalic and atraumatic.     Right Ear: External ear normal.     Left Ear: External ear normal.     Nose: Nose normal.     Mouth/Throat:     Mouth: Mucous membranes are moist.  Eyes:     Extraocular Movements: Extraocular movements intact.     Conjunctiva/sclera: Conjunctivae normal.  Cardiovascular:     Rate  and Rhythm: Normal rate and regular rhythm.  Pulmonary:     Effort: Pulmonary effort is normal. No respiratory distress.  Abdominal:     Palpations: Abdomen is soft.     Tenderness: There is abdominal tenderness (Suprapubic). There is right CVA tenderness and left CVA tenderness. There is no guarding or rebound.  Musculoskeletal:        General: No swelling or deformity. Normal range of motion.     Cervical back: Normal range of motion and neck supple.     Right lower leg: No edema.     Left lower leg: No edema.  Skin:    General: Skin is warm and dry.     Coloration: Skin is not jaundiced or pale.  Neurological:     General: No focal deficit present.     Mental Status: She is alert. Mental status is at baseline. She is disoriented.  Psychiatric:        Mood and Affect: Mood normal.        Behavior: Behavior normal.     (all labs ordered are listed, but only abnormal results are displayed) Labs Reviewed  URINALYSIS, W/ REFLEX TO CULTURE (INFECTION SUSPECTED) - Abnormal; Notable for the following components:      Result Value   APPearance CLOUDY (*)    Leukocytes,Ua LARGE (*)    Bacteria, UA RARE (*)    All other components within normal limits  COMPREHENSIVE METABOLIC PANEL WITH GFR - Abnormal; Notable for the following components:   Sodium 131 (*)    Chloride 97 (*)    Glucose, Bld 108 (*)    Creatinine, Ser 1.09 (*)    GFR, Estimated 49 (*)    All other components within normal limits  CBC WITH DIFFERENTIAL/PLATELET - Abnormal; Notable for the following components:   RBC 3.27 (*)    Hemoglobin 6.9 (*)    HCT 24.0 (*)    MCV 73.4 (*)    MCH 21.1 (*)    MCHC 28.8 (*)    RDW 18.4 (*)    Platelets 471 (*)    nRBC 0.7 (*)    All other components within normal limits  MAGNESIUM - Abnormal; Notable for the following components:   Magnesium 2.5 (*)    All other components within normal limits  POC OCCULT BLOOD, ED - Abnormal; Notable for the following components:    Fecal Occult Bld POSITIVE (*)    All other components within normal limits  TROPONIN T, HIGH SENSITIVITY - Abnormal; Notable for the following components:   Troponin T High Sensitivity 27 (*)    All other components within normal limits  TROPONIN T, HIGH SENSITIVITY - Abnormal; Notable for the following components:   Troponin T High Sensitivity 23 (*)    All other components  within normal limits  URINE CULTURE  PROTIME-INR  TYPE AND SCREEN  PREPARE RBC (CROSSMATCH)  ABO/RH    EKG: EKG Interpretation Date/Time:  Sunday June 28 2024 14:00:04 EDT Ventricular Rate:  68 PR Interval:    QRS Duration:  82 QT Interval:  409 QTC Calculation: 435 R Axis:   8  Text Interpretation: Sinus rhythm Borderline repolarization abnormality Confirmed by Melvenia Motto [id number]) on 06/28/2024 2:39:03 PM  Radiology: CT CHEST ABDOMEN PELVIS WO CONTRAST Result Date: 06/28/2024 CLINICAL DATA:  Polytrauma, blunt. Recently treated UTI. Low back pain. History of dementia. EXAM: CT CHEST, ABDOMEN AND PELVIS WITHOUT CONTRAST TECHNIQUE: Multidetector CT imaging of the chest, abdomen and pelvis was performed following the standard protocol without IV contrast. RADIATION DOSE REDUCTION: This exam was performed according to the departmental dose-optimization program which includes automated exposure control, adjustment of the mA and/or kV according to patient size and/or use of iterative reconstruction technique. COMPARISON:  None Available. FINDINGS: CT CHEST FINDINGS Cardiovascular: The heart is mildly enlarged and there is a trace pericardial effusion. Scattered coronary artery calcifications are noted. There is atherosclerotic calcification of the aorta without evidence of aneurysm. The pulmonary trunk is normal in caliber. Mediastinum/Nodes: No mediastinal or axillary lymphadenopathy. Evaluation of the hila is limited due to lack of IV contrast. The trachea and esophagus are within normal limits. There is a small hiatal  hernia. Lungs/Pleura: Atelectasis/scarring is present bilaterally. No effusion or pneumothorax is seen. Musculoskeletal: A fat containing Bochdalek hernia is noted on the left. Degenerative changes are present in the thoracic spine. Destructive and degenerative changes are present at the glenohumeral joints bilaterally with a chronic subluxation. A joint effusion is present on the left. There is kyphosis of the thoracic spine. No acute fracture is seen. CT ABDOMEN PELVIS FINDINGS Hepatobiliary: No focal liver abnormality is seen. No gallstones, gallbladder wall thickening, or biliary dilatation. Pancreas: Unremarkable. No pancreatic ductal dilatation or surrounding inflammatory changes. Spleen: Normal in size without focal abnormality. Adrenals/Urinary Tract: The adrenal glands are within normal limits. No renal calculus or hydronephrosis bilaterally. The bladder is unremarkable. Stomach/Bowel: There is a small hiatal hernia. No bowel obstruction, free air, or pneumatosis is seen. A moderate amount of retained stool is noted in the colon. Appendix is not seen. Vascular/Lymphatic: Aortic atherosclerosis. No abdominal or pelvic lymphadenopathy. Reproductive: Status post hysterectomy. No adnexal masses. Other: No abdominopelvic ascites. A fat containing umbilical hernia is present. Musculoskeletal: Degenerative changes are noted in the lumbar spine and bilateral hips. There is ankylosis of the the T11 through L3 vertebral bodies. Spinal fusion hardware is noted at L4-L5. No acute fracture is seen. IMPRESSION: 1. No renal calculus or obstructive uropathy bilaterally. 2. Multilevel degenerative changes in the thoracolumbar spine without evidence of acute fracture. 3. Moderate amount of retained stool in the colon suggesting constipation. 4. Small hiatal hernia. 5. Aortic atherosclerosis and coronary artery calcifications. Electronically Signed   By: Leita Birmingham M.D.   On: 06/28/2024 16:02   CT T-SPINE NO  CHARGE Result Date: 06/28/2024 EXAM: CT THORACIC SPINE WITHOUT CONTRAST 06/28/2024 03:30:01 PM TECHNIQUE: CT of the thoracic spine was performed without the administration of intravenous contrast. Multiplanar reformatted images are provided for review. Automated exposure control, iterative reconstruction, and/or weight based adjustment of the mA/kV was utilized to reduce the radiation dose to as low as reasonably achievable. COMPARISON: None available. CLINICAL HISTORY: Recently treated UTI with symptom improvement, now symptoms have returned of lower back pain, polyuria but only drops come out. Hx dementia,  son at bedside. FINDINGS: BONES AND ALIGNMENT: Normal vertebral body heights. Remote superior endplate fracture is present at T8. No acute fracture or suspicious bone lesion. Exaggerated cervical thoracic kyphosis was present. Rightward curvature centered at T11. DEGENERATIVE CHANGES: Ankylosis is present across the disc spaces at T11-T12 and T12-L1. SOFT TISSUES: No acute abnormality. IMPRESSION: 1. No acute findings. 2. Exaggerated cervicothoracic kyphosis. 3. Rightward curvature centered at T11. 4. Ankylosis across the disc spaces at T11-12 and T12-L1. 5. Remote superior endplate fracture at T8. Electronically signed by: Lonni Necessary MD 06/28/2024 03:48 PM EDT RP Workstation: [id number]   CT L-SPINE NO CHARGE Result Date: 06/28/2024 EXAM: CT OF THE LUMBAR SPINE WITHOUT CONTRAST 06/28/2024 03:30:01 PM TECHNIQUE: CT of the lumbar spine was performed without the administration of intravenous contrast. Multiplanar reformatted images are provided for review. Automated exposure control, iterative reconstruction, and/or weight based adjustment of the mA/kV was utilized to reduce the radiation dose to as low as reasonably achievable. COMPARISON: MRI of the lumbar spine 07/28/2024. CLINICAL HISTORY: Recently treated UTI with symptom improvement, now symptoms have returned of lower back pain, polyuria but  only drops come out. Hx dementia, son at bedside. FINDINGS: BONES AND ALIGNMENT: Normal vertebral body heights. Solid fusion is present at L4-L5. An ankylosis of lower thoracic spine extends to L3. Chronic sclerotic changes are present at L3-L4. No acute fracture or suspicious bone lesion. Normal alignment. DEGENERATIVE CHANGES: Moderate central and right greater than left foraminal stenosis is present at L3-L4. Moderate left foraminal stenosis is present at L5-S1. SOFT TISSUES: Atherosclerotic changes are present in the aorta and branch vessels without aneurysm. A small hiatal hernia is present. No acute abnormality. IMPRESSION: 1. No acute findings. 2. Solid fusion at L4-5 and ankylosis of lower thoracic spine extending to L3. 3. Chronic sclerotic changes at L3-4 with moderate central and right greater than left foraminal stenosis. 4. Moderate left foraminal stenosis at L5-S1. Electronically signed by: Lonni Necessary MD 06/28/2024 03:46 PM EDT RP Workstation: [id number]   CT CERVICAL SPINE WO CONTRAST Result Date: 06/28/2024 EXAM: CT CERVICAL SPINE WITHOUT CONTRAST 06/28/2024 03:30:01 PM TECHNIQUE: CT of the cervical spine was performed without the administration of intravenous contrast. Multiplanar reformatted images are provided for review. Automated exposure control, iterative reconstruction, and/or weight based adjustment of the mA/kV was utilized to reduce the radiation dose to as low as reasonably achievable. COMPARISON: CT of the cervical spine 08/10/2008. CLINICAL HISTORY: Polytrauma, blunt. Recently treated UTI with symptom improvement, now symptoms have returned of lower back pain, polyuria but only drops come out. Hx dementia, son at bedside. FINDINGS: CERVICAL SPINE: BONES AND ALIGNMENT: Straightening of the normal cervical lordosis is again noted. Ankylosis is present across the disc spaces C2-C6. No acute fracture or traumatic malalignment. DEGENERATIVE CHANGES: Ankylosis is present across  the disc spaces C2-C6. SOFT TISSUES: No prevertebral soft tissue swelling. IMPRESSION: 1. No acute abnormality of the cervical spine. 2. Ankylosis across the C2C6 disc spaces. Electronically signed by: Lonni Necessary MD 06/28/2024 03:42 PM EDT RP Workstation: [id number]   CT HEAD WO CONTRAST Result Date: 06/28/2024 EXAM: CT HEAD WITHOUT CONTRAST 06/28/2024 03:30:01 PM TECHNIQUE: CT of the head was performed without the administration of intravenous contrast. Automated exposure control, iterative reconstruction, and/or weight based adjustment of the mA/kV was utilized to reduce the radiation dose to as low as reasonably achievable. COMPARISON: CT head without contrast 08/31/2020. CLINICAL HISTORY: Head trauma, moderate-severe. Recently treated UTI with symptom improvement, now symptoms have returned of lower back pain, polyuria but  only drops come out. Hx dementia, son at bedside. FINDINGS: BRAIN AND VENTRICLES: No acute hemorrhage. No evidence of acute infarct. No hydrocephalus. No extra-axial collection. No mass effect or midline shift. Moderate generalized atrophy and white matter changes are similar to the prior study. Minimal atherosclerotic changes are present within the cavernous internal carotid arteries bilaterally. No hyperdense vessel is present. ORBITS: Bilateral lens replacements are noted. The globes and orbits are otherwise within normal limits. SINUSES: No acute abnormality. SOFT TISSUES AND SKULL: No acute soft tissue abnormality. No skull fracture. IMPRESSION: 1. No acute intracranial abnormality. 2. Moderate generalized atrophy and white matter changes, similar to the prior study. Electronically signed by: Lonni Necessary MD 06/28/2024 03:40 PM EDT RP Workstation: [id number]     Procedures   Medications Ordered in the ED  0.9 %  sodium chloride  infusion (Manually program via Guardrails IV Fluids) (has no administration in time range)  pantoprazole (PROTONIX) injection 40 mg  (has no administration in time range)  lactated ringers bolus 500 mL (0 mLs Intravenous Stopped 06/28/24 1442)  lactated ringers bolus 500 mL (500 mLs Intravenous New Bag/Given 06/28/24 1628)                                    Medical Decision Making Amount and/or Complexity of Data Reviewed Labs: ordered. Radiology: ordered.  Risk Prescription drug management.   This patient presents to the ED for concern of generalized weakness and urinary frequency, this involves an extensive number of treatment options, and is a complaint that carries with it a high risk of complications and morbidity.  The differential diagnosis includes UTI, interstitial cystitis, other infection, dehydration, deconditioning, anemia, metabolic derangements   Co morbidities / Chronic conditions that complicate the patient evaluation  dementia, HTN, HLD, asthma, PE   Additional history obtained:  Additional history obtained from EMR External records from outside source obtained and reviewed including patient's son   Lab Tests:  I Ordered, and personally interpreted labs.  The pertinent results include:  creatinine is mildly increased in baseline, hypermagnesemia is present with otherwise normal electrolytes.  No leukocytosis is present.  A new anemia is present with hemoglobin of 6.9, down trended from 9.9 in June and 12.1 one year ago.  Urinalysis is equivocal.   Imaging Studies ordered:  I ordered imaging studies including CT of head, cervical spine, chest, abdomen, pelvis, T-spine, L-spine I independently visualized and interpreted imaging which showed constipation without other acute findings I agree with the radiologist interpretation   Cardiac Monitoring: / EKG:  The patient was maintained on a cardiac monitor.  I personally viewed and interpreted the cardiac monitored which showed an underlying rhythm of: Sinus rhythm   Problem List / ED Course / Critical interventions / Medication  management  Patient presenting for decreased urine output, difficulty urinating, generalized weakness.  She lives at home with family and son reports that the symptoms have been progressing over the past 3 days.  He states that she has been eating and drinking and is unsure of why she has had such a little urine output.  Patient has been having frequency and urgency but only able to urinate very small amounts.  Vital signs on arrival are normal.  Patient is well-appearing on exam.  She is accompanied by her son at bedside.  She does have some suprapubic tenderness as well as some left-sided chest tenderness.  There may have been some  injuries from her recent unwitnessed falls, which are attributed to her generalized weakness.  She has very mild tenderness in area of bilateral CVAs.  On bedside ultrasound, she has approximate 250 cc of urine in her bladder.  When placed on a bedside commode, patient was able to urinate with nurse described as a decent amount..  On initial lab work, patient is found to have acute on chronic anemia.  Her hemoglobin does seem to be downtrending over the past year.  Since June, she has had a 30% drop.  This is likely contributing to her recent fatigue and generalized weakness.  Patient son consented for blood transfusion.  1 unit PRBCs was ordered.  Patient's son is unaware of any recent blood loss.  DRE showed dark stool which was Hemoccult positive.  I suspect symptomatic anemia secondary to upper GI bleed.  Protonix was ordered.  I spoke with gastroenterologist on-call, Dr. Burnette, who will see the patient in consult.  Patient was admitted for further management. I ordered medication including IV fluid for hydration, PRBCs for symptomatic anemia, Protonix for empiric treatment of upper GI bleed Reevaluation of the patient after these medicines showed that the patient improved I have reviewed the patients home medicines and have made adjustments as needed   Consultations  Obtained:  I requested consultation with the gastroenterologist, Dr. Burnette,  and discussed lab and imaging findings as well as pertinent plan - they recommend: Will see in consult   Social Determinants of Health:  Lives at home with family  CRITICAL CARE Performed by: Bernardino Fireman   Total critical care time: 31 minutes  Critical care time was exclusive of separately billable procedures and treating other patients.  Critical care was necessary to treat or prevent imminent or life-threatening deterioration.  Critical care was time spent personally by me on the following activities: development of treatment plan with patient and/or surrogate as well as nursing, discussions with consultants, evaluation of patient's response to treatment, examination of patient, obtaining history from patient or surrogate, ordering and performing treatments and interventions, ordering and review of laboratory studies, ordering and review of radiographic studies, pulse oximetry and re-evaluation of patient's condition.      Final diagnoses:  Symptomatic anemia  Gastrointestinal hemorrhage, unspecified gastrointestinal hemorrhage type    ED Discharge Orders     None          Fireman Bernardino, MD 06/28/24 1752

## 2024-06-28 NOTE — Plan of Care (Signed)
   Problem: Education: Goal: Knowledge of General Education information will improve Description: Including pain rating scale, medication(s)/side effects and non-pharmacologic comfort measures Outcome: Not Progressing

## 2024-06-28 NOTE — ED Notes (Addendum)
 Date and time results received: 06/28/24 2:19 PM  (use smartphrase .now to insert current time)  Test: Hgb Critical Value: 6.9  Name of Provider Notified: Dr. Melvenia  Orders Received? Or Actions Taken?: see chart

## 2024-06-28 NOTE — Hospital Course (Signed)
 Amy Andrade is a 88 y.o. female with medical history significant for Alzheimer's dementia, HTN, HLD, chronic pain due to OA of both shoulders and chronic left shoulder dislocation who is admitted with symptomatic anemia.

## 2024-06-29 DIAGNOSIS — D500 Iron deficiency anemia secondary to blood loss (chronic): Secondary | ICD-10-CM | POA: Diagnosis not present

## 2024-06-29 DIAGNOSIS — R933 Abnormal findings on diagnostic imaging of other parts of digestive tract: Secondary | ICD-10-CM | POA: Diagnosis not present

## 2024-06-29 DIAGNOSIS — K449 Diaphragmatic hernia without obstruction or gangrene: Secondary | ICD-10-CM | POA: Diagnosis not present

## 2024-06-29 DIAGNOSIS — D510 Vitamin B12 deficiency anemia due to intrinsic factor deficiency: Secondary | ICD-10-CM | POA: Diagnosis not present

## 2024-06-29 DIAGNOSIS — D649 Anemia, unspecified: Secondary | ICD-10-CM | POA: Diagnosis not present

## 2024-06-29 LAB — BASIC METABOLIC PANEL WITH GFR
Anion gap: 9 (ref 5–15)
BUN: 13 mg/dL (ref 8–23)
CO2: 23 mmol/L (ref 22–32)
Calcium: 9.1 mg/dL (ref 8.9–10.3)
Chloride: 100 mmol/L (ref 98–111)
Creatinine, Ser: 0.75 mg/dL (ref 0.44–1.00)
GFR, Estimated: >60 mL/min (ref >60)
Glucose, Bld: 111 mg/dL — ABNORMAL HIGH (ref 70–99)
Potassium: 4.2 mmol/L (ref 3.5–5.1)
Sodium: 132 mmol/L — ABNORMAL LOW (ref 135–145)

## 2024-06-29 LAB — CBC
HCT: 27.8 % — ABNORMAL LOW (ref 36.0–46.0)
Hemoglobin: 8.2 g/dL — ABNORMAL LOW (ref 12.0–15.0)
MCH: 21.6 pg — ABNORMAL LOW (ref 26.0–34.0)
MCHC: 29.5 g/dL — ABNORMAL LOW (ref 30.0–36.0)
MCV: 73.2 fL — ABNORMAL LOW (ref 80.0–100.0)
Platelets: 431 K/uL — ABNORMAL HIGH (ref 150–400)
RBC: 3.8 MIL/uL — ABNORMAL LOW (ref 3.87–5.11)
RDW: 17.3 % — ABNORMAL HIGH (ref 11.5–15.5)
WBC: 7.2 K/uL (ref 4.0–10.5)
nRBC: 0 % (ref 0.0–0.2)

## 2024-06-29 LAB — URINE CULTURE: Culture: NO GROWTH

## 2024-06-29 LAB — TYPE AND SCREEN
ABO/RH(D): AB POS
Antibody Screen: NEGATIVE
Unit division: 0

## 2024-06-29 LAB — BPAM RBC
Blood Product Expiration Date: 202511052359
ISSUE DATE / TIME: 202510121750
Unit Type and Rh: 7300

## 2024-06-29 MED ORDER — POLYETHYLENE GLYCOL 3350 17 G PO PACK
17.0000 g | PACK | Freq: Every day | ORAL | Status: DC
  Administered 2024-06-29 – 2024-07-10 (×9): 17 g via ORAL
  Filled 2024-06-29 (×9): qty 1

## 2024-06-29 MED ORDER — VITAMIN B-12 1000 MCG PO TABS: 500.0000 ug | ORAL_TABLET | Freq: Every day | ORAL | Status: DC

## 2024-06-29 MED ORDER — CYANOCOBALAMIN 1000 MCG/ML IJ SOLN
1000.0000 ug | INTRAMUSCULAR | Status: DC
  Administered 2024-06-29 – 2024-07-01 (×2): 1000 ug via INTRAMUSCULAR
  Filled 2024-06-29 (×3): qty 1

## 2024-06-29 MED ORDER — IRBESARTAN 150 MG PO TABS
300.0000 mg | ORAL_TABLET | Freq: Every day | ORAL | Status: DC
  Administered 2024-06-29 – 2024-07-07 (×9): 300 mg via ORAL
  Filled 2024-06-29 (×8): qty 2

## 2024-06-29 NOTE — H&P (View-Only) (Signed)
 Reason for Consult: Guaiac positive stools with anemia. Referring Physician: [facility].  Amy Andrade is an 88 y.o. female.  HPI: Amy Andrade is a 88 year old black female with multiple medical problems listed below who presented the emergency room yesterday with generalized weakness.  In the emergency room she was noted to have a hemoglobin of 6.9 g/dL and was FOBT positive. According to her family member at the bedside she has had some dark stools recently but he cannot say for sure whether they were black and tarry in color.  She has Alzheimer's dementia and is unable to give me much details on her history so most of the history has been procured from the patient's chart and a discussion with her family member who is at the bedside.  Multiple CTs done on arrival in the ER showed no acute abnormalities. She has a chronic left shoulder dislocation and has chronic pain secondary osteoarthritis of both shoulders.  She received 1 unit of packed red blood cells and was started on IV Protonix. Her last colonoscopy done in 2009 was normal.  I have not seen in the office since then. There is no history of nonsteroidal use and she is not anticoagulated. She was also noted to have a B12 level of less than 150.  Past Medical History:  Diagnosis Date   Alzheimer disease (HCC)    Asthma    Cancer (HCC)    Hyperlipidemia    Hypertension    Pulmonary embolism (HCC) 2004   was short of breath--was on coumadin for 6 mos to a year   Past Surgical History:  Procedure Laterality Date   ABDOMINAL HYSTERECTOMY     BUNIONECTOMY     CATARACT EXTRACTION Bilateral 06/17/2022   REPLACEMENT TOTAL KNEE  1980s, 2003   x2   Family History  Problem Relation Age of Onset   Alzheimer's disease Mother    Chronic bronchitis Mother    Heart disease Father    High blood pressure Father    High blood pressure Brother    Asthma Daughter    High blood pressure Son    High Cholesterol Son    Post-traumatic  stress disorder Son    Social History:  reports that she quit smoking about 66 years ago. Her smoking use included cigarettes. She started smoking about 70 years ago. She has a 1 pack-year smoking history. She has never used smokeless tobacco. She reports current alcohol use of about 2.0 standard drinks of alcohol per week. She reports that she does not use drugs.  Allergies: No Known Allergies  Medications: I have reviewed the patient's current medications. Prior to Admission:  Medications Prior to Admission  Medication Sig Dispense Refill Last Dose/Taking   amLODipine  (NORVASC ) 10 MG tablet Take 1 tablet (10 mg total) by mouth daily. (Patient taking differently: Take 10 mg by mouth in the morning.) 90 tablet 1 06/28/2024 Morning   aspirin  81 MG tablet Take 81 mg by mouth in the morning.   06/28/2024 Morning   CALCIUM-VITAMIN D PO Take 1 tablet by mouth 2 (two) times daily.   06/28/2024 Morning   HYDROcodone -acetaminophen  (NORCO/VICODIN) 5-325 MG tablet Take 1 tablet by mouth every 12 (twelve) hours as needed for severe pain (pain score 7-10). (Patient taking differently: Take 1 tablet by mouth every 12 (twelve) hours as needed for severe pain (pain score 7-10) (shoulder pain keeping her awake).) 30 tablet 0 Unknown   irbesartan  (AVAPRO ) 300 MG tablet Take 1 tablet (300 mg total)  by mouth daily. (Patient taking differently: Take 300 mg by mouth in the morning.) 90 tablet 1 06/28/2024 Morning   methocarbamol  (ROBAXIN ) 500 MG tablet Take 1 tablet by mouth once daily (Patient taking differently: Take 500 mg by mouth in the morning.) 90 tablet 1 06/28/2024 Morning   nitroGLYCERIN  (NITROSTAT ) 0.4 MG SL tablet Place 1 tablet (0.4 mg total) under the tongue every 5 (five) minutes x 3 doses as needed for chest pain. 25 tablet 0 Unknown   nystatin  cream (MYCOSTATIN ) Apply 1 application  topically 2 (two) times daily. (Patient taking differently: Apply 1 application  topically as needed for dry skin.) 30 g 3  Unknown   pravastatin  (PRAVACHOL ) 40 MG tablet Take 1 tablet (40 mg total) by mouth daily. (Patient taking differently: Take 40 mg by mouth in the morning.) 90 tablet 1 06/28/2024 Morning   triamcinolone  cream (KENALOG ) 0.1 % Apply 1 Application topically 2 (two) times daily. Affected area on right foot (Patient taking differently: Apply 1 Application topically as needed. Affected area on right foot) 30 g 0 Unknown   Scheduled:  amLODipine   10 mg Oral Daily   cyanocobalamin  1,000 mcg Intramuscular Q48H   irbesartan   300 mg Oral Daily   pantoprazole (PROTONIX) IV  40 mg Intravenous Q12H   pravastatin   40 mg Oral Daily   Continuous: PRN:acetaminophen  **OR** acetaminophen , bisacodyl, ondansetron  **OR** ondansetron  (ZOFRAN ) IV, senna-docusate  Results for orders placed or performed during the hospital encounter of 06/28/24 (from the past 48 hours)  Comprehensive metabolic panel     Status: Abnormal   Collection Time: 06/28/24  1:52 PM  Result Value Ref Range   Sodium 131 (L) 135 - 145 mmol/L   Potassium 4.4 3.5 - 5.1 mmol/L   Chloride 97 (L) 98 - 111 mmol/L   CO2 23 22 - 32 mmol/L   Glucose, Bld 108 (H) 70 - 99 mg/dL    Comment: Glucose reference range applies only to samples taken after fasting for at least 8 hours.   BUN 23 8 - 23 mg/dL   Creatinine, Ser 8.90 (H) 0.44 - 1.00 mg/dL   Calcium 9.6 8.9 - 89.6 mg/dL   Total Protein 6.7 6.5 - 8.1 g/dL   Albumin 4.1 3.5 - 5.0 g/dL   AST 18 15 - 41 U/L   ALT 10 0 - 44 U/L   Alkaline Phosphatase 68 38 - 126 U/L   Total Bilirubin 0.6 0.0 - 1.2 mg/dL   GFR, Estimated 49 (L) >60 mL/min    Comment: (NOTE) Calculated using the CKD-EPI Creatinine Equation (2021)    Anion gap 11 5 - 15    Comment: Performed at [facility], [address], [place], [place] [address]  Protime-INR     Status: None   Collection Time: 06/28/24  1:52 PM  Result Value Ref Range   Prothrombin Time 13.6 11.4 - 15.2 seconds   INR 1.0 0.8 - 1.2     Comment: (NOTE) INR goal varies based on device and disease states. Performed at [facility], [address] [place], [place] [address]   CBC with Differential     Status: Abnormal   Collection Time: 06/28/24  1:52 PM  Result Value Ref Range   WBC 7.6 4.0 - 10.5 K/uL   RBC 3.27 (L) 3.87 - 5.11 MIL/uL   Hemoglobin 6.9 (LL) 12.0 - 15.0 g/dL    Comment: Reticulocyte Hemoglobin testing may be clinically indicated, consider ordering this additional test [id number]  This critical result has been called to CHRISTELLA FORD RN by Rosaleen Hong on 06/28/2024 14:00:00, and has been read back.    HCT 24.0 (L) 36.0 - 46.0 %   MCV 73.4 (L) 80.0 - 100.0 fL   MCH 21.1 (L) 26.0 - 34.0 pg   MCHC 28.8 (L) 30.0 - 36.0 g/dL   RDW 81.5 (H) 88.4 - 84.4 %   Platelets 471 (H) 150 - 400 K/uL   nRBC 0.7 (H) 0.0 - 0.2 %   Neutrophils Relative % 71 %   Neutro Abs 5.5 1.7 - 7.7 K/uL   Lymphocytes Relative 18 %   Lymphs Abs 1.3 0.7 - 4.0 K/uL   Monocytes Relative 10 %   Monocytes Absolute 0.8 0.1 - 1.0 K/uL   Eosinophils Relative 0 %   Eosinophils Absolute 0.0 0.0 - 0.5 K/uL   Basophils Relative 0 %   Basophils Absolute 0.0 0.0 - 0.1 K/uL   Immature Granulocytes 1 %   Abs Immature Granulocytes 0.04 0.00 - 0.07 K/uL    Comment: Performed at [facility], [address], [place], [place] [address]  Magnesium     Status: Abnormal   Collection Time: 06/28/24  1:52 PM  Result Value Ref Range   Magnesium 2.5 (H) 1.7 - 2.4 mg/dL    Comment: Performed at [facility], [address] [place], [place] [address]  Troponin T, High Sensitivity     Status: Abnormal   Collection Time: 06/28/24  1:52 PM  Result Value Ref Range   Troponin T High Sensitivity 27 (H) 0 - 19 ng/L    Comment: (NOTE) Biotin concentrations > 1000 ng/mL falsely decrease TnT results.  Serial cardiac troponin measurements are suggested.  Refer to the Links section for chest pain  algorithms and additional  guidance. Performed at [facility], [address], [place], [place] [address]   Urinalysis, w/ Reflex to Culture (Infection Suspected) -Urine, Clean Catch     Status: Abnormal   Collection Time: 06/28/24  2:37 PM  Result Value Ref Range   Specimen Source URINE, CLEAN CATCH    Color, Urine YELLOW YELLOW   APPearance CLOUDY (A) CLEAR   Specific Gravity, Urine 1.017 1.005 - 1.030   pH 5.0 5.0 - 8.0   Glucose, UA NEGATIVE NEGATIVE mg/dL   Hgb urine dipstick NEGATIVE NEGATIVE   Bilirubin Urine NEGATIVE NEGATIVE   Ketones, ur NEGATIVE NEGATIVE mg/dL   Protein, ur NEGATIVE NEGATIVE mg/dL   Nitrite NEGATIVE NEGATIVE   Leukocytes,Ua LARGE (A) NEGATIVE   RBC / HPF 0-5 0 - 5 RBC/hpf   WBC, UA 11-20 0 - 5 WBC/hpf    Comment:        Reflex urine culture not performed if WBC <=10, OR if Squamous epithelial cells >5. If Squamous epithelial cells >5 suggest recollection.    Bacteria, UA RARE (A) NONE SEEN   Squamous Epithelial / HPF 6-10 0 - 5 /HPF   Mucus PRESENT    Hyaline Casts, UA PRESENT     Comment: Performed at [facility], [address] [place], [place] [address]  Troponin T, High Sensitivity     Status: Abnormal   Collection Time: 06/28/24  4:08 PM  Result Value Ref Range   Troponin T High Sensitivity 23 (H) 0 - 19 ng/L    Comment: (NOTE) Biotin concentrations > 1000 ng/mL falsely decrease TnT results.  Serial cardiac troponin measurements are suggested.  Refer to the Links  section for chest pain algorithms and additional  guidance. Performed at [facility], [address] [place], [place] [address]   Type and screen [facility]     Status: None   Collection Time: 06/28/24  4:08 PM  Result Value Ref Range   ABO/RH(D) AB POS    Antibody Screen NEG    Sample Expiration 07/01/2024,2359    Unit Number [id number]    Blood Component Type RED CELLS,LR    Unit  division 00    Status of Unit ISSUED,FINAL    Transfusion Status OK TO TRANSFUSE    Crossmatch Result      Compatible Performed at [facility], [address], [place], [place] [address]   Prepare RBC (crossmatch)     Status: None   Collection Time: 06/28/24  4:08 PM  Result Value Ref Range   Order Confirmation      ORDER PROCESSED BY BLOOD BANK Performed at [facility], [address], [place], [place] [address]   ABO/Rh     Status: None   Collection Time: 06/28/24  4:30 PM  Result Value Ref Range   ABO/RH(D)      AB POS Performed at [facility], [address] [place], [place] [address]   POC occult blood, ED     Status: Abnormal   Collection Time: 06/28/24  5:08 PM  Result Value Ref Range   Fecal Occult Bld POSITIVE (A) NEGATIVE  Vitamin B12     Status: Abnormal   Collection Time: 06/28/24  9:28 PM  Result Value Ref Range   Vitamin B-12 <150 (L) 180 - 914 pg/mL    Comment: Performed at [facility], [address], [place], [place] [address]  Folate     Status: None   Collection Time: 06/28/24  9:28 PM  Result Value Ref Range   Folate 10.4 >5.9 ng/mL    Comment: Performed at [facility], [address] [place], [place] [address]  Iron and TIBC     Status: None   Collection Time: 06/28/24  9:28 PM  Result Value Ref Range   Iron 57 28 - 170 ug/dL   TIBC 604 749 - 549 ug/dL   Saturation Ratios 14 10.4 - 31.8 %   UIBC 338 ug/dL    Comment: Performed at [facility], [address], [place], [place] [address]  Ferritin     Status: None   Collection Time: 06/28/24  9:28 PM  Result Value Ref Range   Ferritin 21 11 - 307 ng/mL    Comment: Performed at [facility], [address], [place], [place] [address]  CBC     Status: Abnormal   Collection Time: 06/29/24  5:24 AM  Result Value Ref Range   WBC 7.2 4.0 - 10.5 K/uL   RBC 3.80 (L)  3.87 - 5.11 MIL/uL   Hemoglobin 8.2 (L) 12.0 - 15.0 g/dL    Comment: Reticulocyte Hemoglobin testing may be clinically indicated, consider ordering this additional test [id number]    HCT 27.8 (L) 36.0 - 46.0 %   MCV 73.2 (L) 80.0 - 100.0 fL   MCH 21.6 (L) 26.0 - 34.0 pg   MCHC 29.5 (L) 30.0 - 36.0 g/dL   RDW 82.6 (H) 88.4 - 84.4 %   Platelets 431 (H) 150 - 400 K/uL   nRBC 0.0 0.0 - 0.2 %    Comment: Performed at [facility], [address] [place], [place] [address]  Basic metabolic panel     Status: Abnormal   Collection Time: 06/29/24  5:24 AM  Result Value Ref Range   Sodium 132 (L) 135 - 145 mmol/L   Potassium 4.2 3.5 - 5.1 mmol/L   Chloride 100 98 - 111 mmol/L   CO2 23 22 - 32 mmol/L   Glucose, Bld 111 (H) 70 - 99 mg/dL    Comment: Glucose reference range applies only to samples taken after fasting for at least 8 hours.   BUN 13 8 - 23 mg/dL   Creatinine, Ser 9.24 0.44 - 1.00 mg/dL   Calcium 9.1 8.9 - 89.6 mg/dL   GFR, Estimated >39 >39 mL/min    Comment: (NOTE) Calculated using the CKD-EPI Creatinine Equation (2021)    Anion gap 9 5 - 15    Comment: Performed at [facility], [address] [place], [place] [address]    CT CHEST ABDOMEN PELVIS WO CONTRAST Result Date: 06/28/2024 CLINICAL DATA:  Polytrauma, blunt. Recently treated UTI. Low back pain. History of dementia. EXAM: CT CHEST, ABDOMEN AND PELVIS WITHOUT CONTRAST TECHNIQUE: Multidetector CT imaging of the chest, abdomen and pelvis was performed following the standard protocol without IV contrast. RADIATION DOSE REDUCTION: This exam was performed according to the departmental dose-optimization program which includes automated exposure control, adjustment of the mA and/or kV according to patient size and/or use of iterative reconstruction technique. COMPARISON:  None Available. FINDINGS: CT CHEST FINDINGS Cardiovascular: The heart is mildly enlarged and there is a trace  pericardial effusion. Scattered coronary artery calcifications are noted. There is atherosclerotic calcification of the aorta without evidence of aneurysm. The pulmonary trunk is normal in caliber. Mediastinum/Nodes: No mediastinal or axillary lymphadenopathy. Evaluation of the hila is limited due to lack of IV contrast. The trachea and esophagus are within normal limits. There is a small hiatal hernia. Lungs/Pleura: Atelectasis/scarring is present bilaterally. No effusion or pneumothorax is seen. Musculoskeletal: A fat containing Bochdalek hernia is noted on the left. Degenerative changes are present in the thoracic spine. Destructive and degenerative changes are present at the glenohumeral joints bilaterally with a chronic subluxation. A joint effusion is present on the left. There is kyphosis of the thoracic spine. No acute fracture is seen. CT ABDOMEN PELVIS FINDINGS Hepatobiliary: No focal liver abnormality is seen. No gallstones, gallbladder wall thickening, or biliary dilatation. Pancreas: Unremarkable. No pancreatic ductal dilatation or surrounding inflammatory changes. Spleen: Normal in size without focal abnormality. Adrenals/Urinary Tract: The adrenal glands are within normal limits. No renal calculus or hydronephrosis bilaterally. The bladder is unremarkable. Stomach/Bowel: There is a small hiatal hernia. No bowel obstruction, free air, or pneumatosis is seen. A moderate amount of retained stool is noted in the colon. Appendix is not seen. Vascular/Lymphatic: Aortic atherosclerosis. No abdominal or pelvic lymphadenopathy. Reproductive: Status post hysterectomy. No adnexal masses. Other: No abdominopelvic ascites. A fat containing umbilical hernia is present. Musculoskeletal: Degenerative changes are noted in the lumbar spine and bilateral hips. There is ankylosis of the the T11 through L3 vertebral bodies. Spinal fusion hardware is noted at L4-L5. No acute fracture is seen. IMPRESSION: 1. No renal  calculus or obstructive uropathy bilaterally. 2. Multilevel degenerative changes in the thoracolumbar spine without evidence of acute fracture. 3. Moderate amount of retained stool in the colon suggesting constipation. 4. Small hiatal hernia. 5. Aortic atherosclerosis and coronary artery calcifications. Electronically Signed   By: Leita Birmingham M.D.   On: 06/28/2024  16:02   CT T-SPINE NO CHARGE Result Date: 06/28/2024 EXAM: CT THORACIC SPINE WITHOUT CONTRAST 06/28/2024 03:30:01 PM TECHNIQUE: CT of the thoracic spine was performed without the administration of intravenous contrast. Multiplanar reformatted images are provided for review. Automated exposure control, iterative reconstruction, and/or weight based adjustment of the mA/kV was utilized to reduce the radiation dose to as low as reasonably achievable. COMPARISON: None available. CLINICAL HISTORY: Recently treated UTI with symptom improvement, now symptoms have returned of lower back pain, polyuria but only drops come out. Hx dementia, son at bedside. FINDINGS: BONES AND ALIGNMENT: Normal vertebral body heights. Remote superior endplate fracture is present at T8. No acute fracture or suspicious bone lesion. Exaggerated cervical thoracic kyphosis was present. Rightward curvature centered at T11. DEGENERATIVE CHANGES: Ankylosis is present across the disc spaces at T11-T12 and T12-L1. SOFT TISSUES: No acute abnormality. IMPRESSION: 1. No acute findings. 2. Exaggerated cervicothoracic kyphosis. 3. Rightward curvature centered at T11. 4. Ankylosis across the disc spaces at T11-12 and T12-L1. 5. Remote superior endplate fracture at T8. Electronically signed by: Lonni Necessary MD 06/28/2024 03:48 PM EDT RP Workstation: [id number]   CT L-SPINE NO CHARGE Result Date: 06/28/2024 EXAM: CT OF THE LUMBAR SPINE WITHOUT CONTRAST 06/28/2024 03:30:01 PM TECHNIQUE: CT of the lumbar spine was performed without the administration of intravenous contrast. Multiplanar  reformatted images are provided for review. Automated exposure control, iterative reconstruction, and/or weight based adjustment of the mA/kV was utilized to reduce the radiation dose to as low as reasonably achievable. COMPARISON: MRI of the lumbar spine 07/28/2024. CLINICAL HISTORY: Recently treated UTI with symptom improvement, now symptoms have returned of lower back pain, polyuria but only drops come out. Hx dementia, son at bedside. FINDINGS: BONES AND ALIGNMENT: Normal vertebral body heights. Solid fusion is present at L4-L5. An ankylosis of lower thoracic spine extends to L3. Chronic sclerotic changes are present at L3-L4. No acute fracture or suspicious bone lesion. Normal alignment. DEGENERATIVE CHANGES: Moderate central and right greater than left foraminal stenosis is present at L3-L4. Moderate left foraminal stenosis is present at L5-S1. SOFT TISSUES: Atherosclerotic changes are present in the aorta and branch vessels without aneurysm. A small hiatal hernia is present. No acute abnormality. IMPRESSION: 1. No acute findings. 2. Solid fusion at L4-5 and ankylosis of lower thoracic spine extending to L3. 3. Chronic sclerotic changes at L3-4 with moderate central and right greater than left foraminal stenosis. 4. Moderate left foraminal stenosis at L5-S1. Electronically signed by: Lonni Necessary MD 06/28/2024 03:46 PM EDT RP Workstation: [id number]   CT CERVICAL SPINE WO CONTRAST Result Date: 06/28/2024 EXAM: CT CERVICAL SPINE WITHOUT CONTRAST 06/28/2024 03:30:01 PM TECHNIQUE: CT of the cervical spine was performed without the administration of intravenous contrast. Multiplanar reformatted images are provided for review. Automated exposure control, iterative reconstruction, and/or weight based adjustment of the mA/kV was utilized to reduce the radiation dose to as low as reasonably achievable. COMPARISON: CT of the cervical spine 08/10/2008. CLINICAL HISTORY: Polytrauma, blunt. Recently treated UTI  with symptom improvement, now symptoms have returned of lower back pain, polyuria but only drops come out. Hx dementia, son at bedside. FINDINGS: CERVICAL SPINE: BONES AND ALIGNMENT: Straightening of the normal cervical lordosis is again noted. Ankylosis is present across the disc spaces C2-C6. No acute fracture or traumatic malalignment. DEGENERATIVE CHANGES: Ankylosis is present across the disc spaces C2-C6. SOFT TISSUES: No prevertebral soft tissue swelling. IMPRESSION: 1. No acute abnormality of the cervical spine. 2. Ankylosis across the C2C6 disc spaces. Electronically signed by:  Lonni Necessary MD 06/28/2024 03:42 PM EDT RP Workstation: [id number]   CT HEAD WO CONTRAST Result Date: 06/28/2024 EXAM: CT HEAD WITHOUT CONTRAST 06/28/2024 03:30:01 PM TECHNIQUE: CT of the head was performed without the administration of intravenous contrast. Automated exposure control, iterative reconstruction, and/or weight based adjustment of the mA/kV was utilized to reduce the radiation dose to as low as reasonably achievable. COMPARISON: CT head without contrast 08/31/2020. CLINICAL HISTORY: Head trauma, moderate-severe. Recently treated UTI with symptom improvement, now symptoms have returned of lower back pain, polyuria but only drops come out. Hx dementia, son at bedside. FINDINGS: BRAIN AND VENTRICLES: No acute hemorrhage. No evidence of acute infarct. No hydrocephalus. No extra-axial collection. No mass effect or midline shift. Moderate generalized atrophy and white matter changes are similar to the prior study. Minimal atherosclerotic changes are present within the cavernous internal carotid arteries bilaterally. No hyperdense vessel is present. ORBITS: Bilateral lens replacements are noted. The globes and orbits are otherwise within normal limits. SINUSES: No acute abnormality. SOFT TISSUES AND SKULL: No acute soft tissue abnormality. No skull fracture. IMPRESSION: 1. No acute intracranial abnormality. 2.  Moderate generalized atrophy and white matter changes, similar to the prior study. Electronically signed by: Lonni Necessary MD 06/28/2024 03:40 PM EDT RP Workstation: [id number]   Review of Systems  Unable to perform ROS: Mental status change   Blood pressure (!) 182/63, pulse 77, temperature 98.8 F (37.1 C), resp. rate 18, height 5' 10 (1.778 m), weight 80 kg, SpO2 98%. Physical Exam Vitals reviewed.  Constitutional:      General: She is not in acute distress.    Appearance: She is obese. She is ill-appearing. She is not toxic-appearing or diaphoretic.  HENT:     Head: Normocephalic and atraumatic.     Mouth/Throat:     Mouth: Mucous membranes are moist.  Cardiovascular:     Rate and Rhythm: Normal rate and regular rhythm.  Abdominal:     General: There is no distension.     Tenderness: There is no abdominal tenderness.  Musculoskeletal:     Cervical back: Neck supple.  Skin:    General: Skin is warm and dry.  Neurological:     General: No focal deficit present.  Psychiatric:        Mood and Affect: Mood is anxious.        Speech: Speech normal.        Behavior: Behavior is not agitated.   Assessment/Plan: 1) Symptomatic anemia with B12 deficiency and guaiac positive stools-plans are to do EGD and colonoscopy on 07/01/2024 and prep the patient for the procedure tomorrow. I was unable to procure her spot for her procedures tomorrow. Will allow her to have clear liquids for now. Agree with vitamin B12 supplementation. 2) Alzheimer's dementia. 3) Hypertension/hyperlipidemia. 4) Mild renal insufficiency. 5) Small hiatal hernia. 6) Multilevel level of degenerative changes of thoracolumbar spine without any acute fracture. 7) Retained stool on CT consistent with constipation. 8) Aortic atherosclerosis.  Renaye Sous 06/29/2024, 2:27 PM

## 2024-06-29 NOTE — Care Management Obs Status (Signed)
 MEDICARE OBSERVATION STATUS NOTIFICATION   Patient Details  Name: Amy Andrade MRN: [id number] Date of Birth: 04-17-1936   Medicare Observation Status Notification Given:  Yes    Sheri ONEIDA Sharps, LCSW 06/29/2024, 3:51 PM

## 2024-06-29 NOTE — Consult Note (Addendum)
 Reason for Consult: Guaiac positive stools with anemia. Referring Physician: [facility].  Amy Andrade is an 88 y.o. female.  HPI: Amy Andrade is a 88 year old black female with multiple medical problems listed below who presented the emergency room yesterday with generalized weakness.  In the emergency room she was noted to have a hemoglobin of 6.9 g/dL and was FOBT positive. According to her family member at the bedside she has had some dark stools recently but he cannot say for sure whether they were black and tarry in color.  She has Alzheimer's dementia and is unable to give me much details on her history so most of the history has been procured from the patient's chart and a discussion with her family member who is at the bedside.  Multiple CTs done on arrival in the ER showed no acute abnormalities. She has a chronic left shoulder dislocation and has chronic pain secondary osteoarthritis of both shoulders.  She received 1 unit of packed red blood cells and was started on IV Protonix. Her last colonoscopy done in 2009 was normal.  I have not seen in the office since then. There is no history of nonsteroidal use and she is not anticoagulated. She was also noted to have a B12 level of less than 150.  Past Medical History:  Diagnosis Date   Alzheimer disease (HCC)    Asthma    Cancer (HCC)    Hyperlipidemia    Hypertension    Pulmonary embolism (HCC) 2004   was short of breath--was on coumadin for 6 mos to a year   Past Surgical History:  Procedure Laterality Date   ABDOMINAL HYSTERECTOMY     BUNIONECTOMY     CATARACT EXTRACTION Bilateral 06/17/2022   REPLACEMENT TOTAL KNEE  1980s, 2003   x2   Family History  Problem Relation Age of Onset   Alzheimer's disease Mother    Chronic bronchitis Mother    Heart disease Father    High blood pressure Father    High blood pressure Brother    Asthma Daughter    High blood pressure Son    High Cholesterol Son    Post-traumatic  stress disorder Son    Social History:  reports that she quit smoking about 66 years ago. Her smoking use included cigarettes. She started smoking about 70 years ago. She has a 1 pack-year smoking history. She has never used smokeless tobacco. She reports current alcohol use of about 2.0 standard drinks of alcohol per week. She reports that she does not use drugs.  Allergies: No Known Allergies  Medications: I have reviewed the patient's current medications. Prior to Admission:  Medications Prior to Admission  Medication Sig Dispense Refill Last Dose/Taking   amLODipine  (NORVASC ) 10 MG tablet Take 1 tablet (10 mg total) by mouth daily. (Patient taking differently: Take 10 mg by mouth in the morning.) 90 tablet 1 06/28/2024 Morning   aspirin  81 MG tablet Take 81 mg by mouth in the morning.   06/28/2024 Morning   CALCIUM-VITAMIN D PO Take 1 tablet by mouth 2 (two) times daily.   06/28/2024 Morning   HYDROcodone -acetaminophen  (NORCO/VICODIN) 5-325 MG tablet Take 1 tablet by mouth every 12 (twelve) hours as needed for severe pain (pain score 7-10). (Patient taking differently: Take 1 tablet by mouth every 12 (twelve) hours as needed for severe pain (pain score 7-10) (shoulder pain keeping her awake).) 30 tablet 0 Unknown   irbesartan  (AVAPRO ) 300 MG tablet Take 1 tablet (300 mg total)  by mouth daily. (Patient taking differently: Take 300 mg by mouth in the morning.) 90 tablet 1 06/28/2024 Morning   methocarbamol  (ROBAXIN ) 500 MG tablet Take 1 tablet by mouth once daily (Patient taking differently: Take 500 mg by mouth in the morning.) 90 tablet 1 06/28/2024 Morning   nitroGLYCERIN  (NITROSTAT ) 0.4 MG SL tablet Place 1 tablet (0.4 mg total) under the tongue every 5 (five) minutes x 3 doses as needed for chest pain. 25 tablet 0 Unknown   nystatin  cream (MYCOSTATIN ) Apply 1 application  topically 2 (two) times daily. (Patient taking differently: Apply 1 application  topically as needed for dry skin.) 30 g 3  Unknown   pravastatin  (PRAVACHOL ) 40 MG tablet Take 1 tablet (40 mg total) by mouth daily. (Patient taking differently: Take 40 mg by mouth in the morning.) 90 tablet 1 06/28/2024 Morning   triamcinolone  cream (KENALOG ) 0.1 % Apply 1 Application topically 2 (two) times daily. Affected area on right foot (Patient taking differently: Apply 1 Application topically as needed. Affected area on right foot) 30 g 0 Unknown   Scheduled:  amLODipine   10 mg Oral Daily   cyanocobalamin  1,000 mcg Intramuscular Q48H   irbesartan   300 mg Oral Daily   pantoprazole (PROTONIX) IV  40 mg Intravenous Q12H   pravastatin   40 mg Oral Daily   Continuous: PRN:acetaminophen  **OR** acetaminophen , bisacodyl, ondansetron  **OR** ondansetron  (ZOFRAN ) IV, senna-docusate  Results for orders placed or performed during the hospital encounter of 06/28/24 (from the past 48 hours)  Comprehensive metabolic panel     Status: Abnormal   Collection Time: 06/28/24  1:52 PM  Result Value Ref Range   Sodium 131 (L) 135 - 145 mmol/L   Potassium 4.4 3.5 - 5.1 mmol/L   Chloride 97 (L) 98 - 111 mmol/L   CO2 23 22 - 32 mmol/L   Glucose, Bld 108 (H) 70 - 99 mg/dL    Comment: Glucose reference range applies only to samples taken after fasting for at least 8 hours.   BUN 23 8 - 23 mg/dL   Creatinine, Ser 8.90 (H) 0.44 - 1.00 mg/dL   Calcium 9.6 8.9 - 89.6 mg/dL   Total Protein 6.7 6.5 - 8.1 g/dL   Albumin 4.1 3.5 - 5.0 g/dL   AST 18 15 - 41 U/L   ALT 10 0 - 44 U/L   Alkaline Phosphatase 68 38 - 126 U/L   Total Bilirubin 0.6 0.0 - 1.2 mg/dL   GFR, Estimated 49 (L) >60 mL/min    Comment: (NOTE) Calculated using the CKD-EPI Creatinine Equation (2021)    Anion gap 11 5 - 15    Comment: Performed at [facility], [address], [place], [place] [address]  Protime-INR     Status: None   Collection Time: 06/28/24  1:52 PM  Result Value Ref Range   Prothrombin Time 13.6 11.4 - 15.2 seconds   INR 1.0 0.8 - 1.2     Comment: (NOTE) INR goal varies based on device and disease states. Performed at [facility], [address] [place], [place] [address]   CBC with Differential     Status: Abnormal   Collection Time: 06/28/24  1:52 PM  Result Value Ref Range   WBC 7.6 4.0 - 10.5 K/uL   RBC 3.27 (L) 3.87 - 5.11 MIL/uL   Hemoglobin 6.9 (LL) 12.0 - 15.0 g/dL    Comment: Reticulocyte Hemoglobin testing may be clinically indicated, consider ordering this additional test [id number]  This critical result has been called to CHRISTELLA FORD RN by Rosaleen Hong on 06/28/2024 14:00:00, and has been read back.    HCT 24.0 (L) 36.0 - 46.0 %   MCV 73.4 (L) 80.0 - 100.0 fL   MCH 21.1 (L) 26.0 - 34.0 pg   MCHC 28.8 (L) 30.0 - 36.0 g/dL   RDW 81.5 (H) 88.4 - 84.4 %   Platelets 471 (H) 150 - 400 K/uL   nRBC 0.7 (H) 0.0 - 0.2 %   Neutrophils Relative % 71 %   Neutro Abs 5.5 1.7 - 7.7 K/uL   Lymphocytes Relative 18 %   Lymphs Abs 1.3 0.7 - 4.0 K/uL   Monocytes Relative 10 %   Monocytes Absolute 0.8 0.1 - 1.0 K/uL   Eosinophils Relative 0 %   Eosinophils Absolute 0.0 0.0 - 0.5 K/uL   Basophils Relative 0 %   Basophils Absolute 0.0 0.0 - 0.1 K/uL   Immature Granulocytes 1 %   Abs Immature Granulocytes 0.04 0.00 - 0.07 K/uL    Comment: Performed at [facility], [address], [place], [place] [address]  Magnesium     Status: Abnormal   Collection Time: 06/28/24  1:52 PM  Result Value Ref Range   Magnesium 2.5 (H) 1.7 - 2.4 mg/dL    Comment: Performed at [facility], [address] [place], [place] [address]  Troponin T, High Sensitivity     Status: Abnormal   Collection Time: 06/28/24  1:52 PM  Result Value Ref Range   Troponin T High Sensitivity 27 (H) 0 - 19 ng/L    Comment: (NOTE) Biotin concentrations > 1000 ng/mL falsely decrease TnT results.  Serial cardiac troponin measurements are suggested.  Refer to the Links section for chest pain  algorithms and additional  guidance. Performed at [facility], [address], [place], [place] [address]   Urinalysis, w/ Reflex to Culture (Infection Suspected) -Urine, Clean Catch     Status: Abnormal   Collection Time: 06/28/24  2:37 PM  Result Value Ref Range   Specimen Source URINE, CLEAN CATCH    Color, Urine YELLOW YELLOW   APPearance CLOUDY (A) CLEAR   Specific Gravity, Urine 1.017 1.005 - 1.030   pH 5.0 5.0 - 8.0   Glucose, UA NEGATIVE NEGATIVE mg/dL   Hgb urine dipstick NEGATIVE NEGATIVE   Bilirubin Urine NEGATIVE NEGATIVE   Ketones, ur NEGATIVE NEGATIVE mg/dL   Protein, ur NEGATIVE NEGATIVE mg/dL   Nitrite NEGATIVE NEGATIVE   Leukocytes,Ua LARGE (A) NEGATIVE   RBC / HPF 0-5 0 - 5 RBC/hpf   WBC, UA 11-20 0 - 5 WBC/hpf    Comment:        Reflex urine culture not performed if WBC <=10, OR if Squamous epithelial cells >5. If Squamous epithelial cells >5 suggest recollection.    Bacteria, UA RARE (A) NONE SEEN   Squamous Epithelial / HPF 6-10 0 - 5 /HPF   Mucus PRESENT    Hyaline Casts, UA PRESENT     Comment: Performed at [facility], [address] [place], [place] [address]  Troponin T, High Sensitivity     Status: Abnormal   Collection Time: 06/28/24  4:08 PM  Result Value Ref Range   Troponin T High Sensitivity 23 (H) 0 - 19 ng/L    Comment: (NOTE) Biotin concentrations > 1000 ng/mL falsely decrease TnT results.  Serial cardiac troponin measurements are suggested.  Refer to the Links  section for chest pain algorithms and additional  guidance. Performed at [facility], [address] [place], [place] [address]   Type and screen [facility]     Status: None   Collection Time: 06/28/24  4:08 PM  Result Value Ref Range   ABO/RH(D) AB POS    Antibody Screen NEG    Sample Expiration 07/01/2024,2359    Unit Number [id number]    Blood Component Type RED CELLS,LR    Unit  division 00    Status of Unit ISSUED,FINAL    Transfusion Status OK TO TRANSFUSE    Crossmatch Result      Compatible Performed at [facility], [address], [place], [place] [address]   Prepare RBC (crossmatch)     Status: None   Collection Time: 06/28/24  4:08 PM  Result Value Ref Range   Order Confirmation      ORDER PROCESSED BY BLOOD BANK Performed at [facility], [address], [place], [place] [address]   ABO/Rh     Status: None   Collection Time: 06/28/24  4:30 PM  Result Value Ref Range   ABO/RH(D)      AB POS Performed at [facility], [address] [place], [place] [address]   POC occult blood, ED     Status: Abnormal   Collection Time: 06/28/24  5:08 PM  Result Value Ref Range   Fecal Occult Bld POSITIVE (A) NEGATIVE  Vitamin B12     Status: Abnormal   Collection Time: 06/28/24  9:28 PM  Result Value Ref Range   Vitamin B-12 <150 (L) 180 - 914 pg/mL    Comment: Performed at [facility], [address], [place], [place] [address]  Folate     Status: None   Collection Time: 06/28/24  9:28 PM  Result Value Ref Range   Folate 10.4 >5.9 ng/mL    Comment: Performed at [facility], [address] [place], [place] [address]  Iron and TIBC     Status: None   Collection Time: 06/28/24  9:28 PM  Result Value Ref Range   Iron 57 28 - 170 ug/dL   TIBC 604 749 - 549 ug/dL   Saturation Ratios 14 10.4 - 31.8 %   UIBC 338 ug/dL    Comment: Performed at [facility], [address], [place], [place] [address]  Ferritin     Status: None   Collection Time: 06/28/24  9:28 PM  Result Value Ref Range   Ferritin 21 11 - 307 ng/mL    Comment: Performed at [facility], [address], [place], [place] [address]  CBC     Status: Abnormal   Collection Time: 06/29/24  5:24 AM  Result Value Ref Range   WBC 7.2 4.0 - 10.5 K/uL   RBC 3.80 (L)  3.87 - 5.11 MIL/uL   Hemoglobin 8.2 (L) 12.0 - 15.0 g/dL    Comment: Reticulocyte Hemoglobin testing may be clinically indicated, consider ordering this additional test [id number]    HCT 27.8 (L) 36.0 - 46.0 %   MCV 73.2 (L) 80.0 - 100.0 fL   MCH 21.6 (L) 26.0 - 34.0 pg   MCHC 29.5 (L) 30.0 - 36.0 g/dL   RDW 82.6 (H) 88.4 - 84.4 %   Platelets 431 (H) 150 - 400 K/uL   nRBC 0.0 0.0 - 0.2 %    Comment: Performed at [facility], [address] [place], [place] [address]  Basic metabolic panel     Status: Abnormal   Collection Time: 06/29/24  5:24 AM  Result Value Ref Range   Sodium 132 (L) 135 - 145 mmol/L   Potassium 4.2 3.5 - 5.1 mmol/L   Chloride 100 98 - 111 mmol/L   CO2 23 22 - 32 mmol/L   Glucose, Bld 111 (H) 70 - 99 mg/dL    Comment: Glucose reference range applies only to samples taken after fasting for at least 8 hours.   BUN 13 8 - 23 mg/dL   Creatinine, Ser 9.24 0.44 - 1.00 mg/dL   Calcium 9.1 8.9 - 89.6 mg/dL   GFR, Estimated >39 >39 mL/min    Comment: (NOTE) Calculated using the CKD-EPI Creatinine Equation (2021)    Anion gap 9 5 - 15    Comment: Performed at [facility], [address] [place], [place] [address]    CT CHEST ABDOMEN PELVIS WO CONTRAST Result Date: 06/28/2024 CLINICAL DATA:  Polytrauma, blunt. Recently treated UTI. Low back pain. History of dementia. EXAM: CT CHEST, ABDOMEN AND PELVIS WITHOUT CONTRAST TECHNIQUE: Multidetector CT imaging of the chest, abdomen and pelvis was performed following the standard protocol without IV contrast. RADIATION DOSE REDUCTION: This exam was performed according to the departmental dose-optimization program which includes automated exposure control, adjustment of the mA and/or kV according to patient size and/or use of iterative reconstruction technique. COMPARISON:  None Available. FINDINGS: CT CHEST FINDINGS Cardiovascular: The heart is mildly enlarged and there is a trace  pericardial effusion. Scattered coronary artery calcifications are noted. There is atherosclerotic calcification of the aorta without evidence of aneurysm. The pulmonary trunk is normal in caliber. Mediastinum/Nodes: No mediastinal or axillary lymphadenopathy. Evaluation of the hila is limited due to lack of IV contrast. The trachea and esophagus are within normal limits. There is a small hiatal hernia. Lungs/Pleura: Atelectasis/scarring is present bilaterally. No effusion or pneumothorax is seen. Musculoskeletal: A fat containing Bochdalek hernia is noted on the left. Degenerative changes are present in the thoracic spine. Destructive and degenerative changes are present at the glenohumeral joints bilaterally with a chronic subluxation. A joint effusion is present on the left. There is kyphosis of the thoracic spine. No acute fracture is seen. CT ABDOMEN PELVIS FINDINGS Hepatobiliary: No focal liver abnormality is seen. No gallstones, gallbladder wall thickening, or biliary dilatation. Pancreas: Unremarkable. No pancreatic ductal dilatation or surrounding inflammatory changes. Spleen: Normal in size without focal abnormality. Adrenals/Urinary Tract: The adrenal glands are within normal limits. No renal calculus or hydronephrosis bilaterally. The bladder is unremarkable. Stomach/Bowel: There is a small hiatal hernia. No bowel obstruction, free air, or pneumatosis is seen. A moderate amount of retained stool is noted in the colon. Appendix is not seen. Vascular/Lymphatic: Aortic atherosclerosis. No abdominal or pelvic lymphadenopathy. Reproductive: Status post hysterectomy. No adnexal masses. Other: No abdominopelvic ascites. A fat containing umbilical hernia is present. Musculoskeletal: Degenerative changes are noted in the lumbar spine and bilateral hips. There is ankylosis of the the T11 through L3 vertebral bodies. Spinal fusion hardware is noted at L4-L5. No acute fracture is seen. IMPRESSION: 1. No renal  calculus or obstructive uropathy bilaterally. 2. Multilevel degenerative changes in the thoracolumbar spine without evidence of acute fracture. 3. Moderate amount of retained stool in the colon suggesting constipation. 4. Small hiatal hernia. 5. Aortic atherosclerosis and coronary artery calcifications. Electronically Signed   By: Leita Birmingham M.D.   On: 06/28/2024  16:02   CT T-SPINE NO CHARGE Result Date: 06/28/2024 EXAM: CT THORACIC SPINE WITHOUT CONTRAST 06/28/2024 03:30:01 PM TECHNIQUE: CT of the thoracic spine was performed without the administration of intravenous contrast. Multiplanar reformatted images are provided for review. Automated exposure control, iterative reconstruction, and/or weight based adjustment of the mA/kV was utilized to reduce the radiation dose to as low as reasonably achievable. COMPARISON: None available. CLINICAL HISTORY: Recently treated UTI with symptom improvement, now symptoms have returned of lower back pain, polyuria but only drops come out. Hx dementia, son at bedside. FINDINGS: BONES AND ALIGNMENT: Normal vertebral body heights. Remote superior endplate fracture is present at T8. No acute fracture or suspicious bone lesion. Exaggerated cervical thoracic kyphosis was present. Rightward curvature centered at T11. DEGENERATIVE CHANGES: Ankylosis is present across the disc spaces at T11-T12 and T12-L1. SOFT TISSUES: No acute abnormality. IMPRESSION: 1. No acute findings. 2. Exaggerated cervicothoracic kyphosis. 3. Rightward curvature centered at T11. 4. Ankylosis across the disc spaces at T11-12 and T12-L1. 5. Remote superior endplate fracture at T8. Electronically signed by: Lonni Necessary MD 06/28/2024 03:48 PM EDT RP Workstation: [id number]   CT L-SPINE NO CHARGE Result Date: 06/28/2024 EXAM: CT OF THE LUMBAR SPINE WITHOUT CONTRAST 06/28/2024 03:30:01 PM TECHNIQUE: CT of the lumbar spine was performed without the administration of intravenous contrast. Multiplanar  reformatted images are provided for review. Automated exposure control, iterative reconstruction, and/or weight based adjustment of the mA/kV was utilized to reduce the radiation dose to as low as reasonably achievable. COMPARISON: MRI of the lumbar spine 07/28/2024. CLINICAL HISTORY: Recently treated UTI with symptom improvement, now symptoms have returned of lower back pain, polyuria but only drops come out. Hx dementia, son at bedside. FINDINGS: BONES AND ALIGNMENT: Normal vertebral body heights. Solid fusion is present at L4-L5. An ankylosis of lower thoracic spine extends to L3. Chronic sclerotic changes are present at L3-L4. No acute fracture or suspicious bone lesion. Normal alignment. DEGENERATIVE CHANGES: Moderate central and right greater than left foraminal stenosis is present at L3-L4. Moderate left foraminal stenosis is present at L5-S1. SOFT TISSUES: Atherosclerotic changes are present in the aorta and branch vessels without aneurysm. A small hiatal hernia is present. No acute abnormality. IMPRESSION: 1. No acute findings. 2. Solid fusion at L4-5 and ankylosis of lower thoracic spine extending to L3. 3. Chronic sclerotic changes at L3-4 with moderate central and right greater than left foraminal stenosis. 4. Moderate left foraminal stenosis at L5-S1. Electronically signed by: Lonni Necessary MD 06/28/2024 03:46 PM EDT RP Workstation: [id number]   CT CERVICAL SPINE WO CONTRAST Result Date: 06/28/2024 EXAM: CT CERVICAL SPINE WITHOUT CONTRAST 06/28/2024 03:30:01 PM TECHNIQUE: CT of the cervical spine was performed without the administration of intravenous contrast. Multiplanar reformatted images are provided for review. Automated exposure control, iterative reconstruction, and/or weight based adjustment of the mA/kV was utilized to reduce the radiation dose to as low as reasonably achievable. COMPARISON: CT of the cervical spine 08/10/2008. CLINICAL HISTORY: Polytrauma, blunt. Recently treated UTI  with symptom improvement, now symptoms have returned of lower back pain, polyuria but only drops come out. Hx dementia, son at bedside. FINDINGS: CERVICAL SPINE: BONES AND ALIGNMENT: Straightening of the normal cervical lordosis is again noted. Ankylosis is present across the disc spaces C2-C6. No acute fracture or traumatic malalignment. DEGENERATIVE CHANGES: Ankylosis is present across the disc spaces C2-C6. SOFT TISSUES: No prevertebral soft tissue swelling. IMPRESSION: 1. No acute abnormality of the cervical spine. 2. Ankylosis across the C2C6 disc spaces. Electronically signed by:  Lonni Necessary MD 06/28/2024 03:42 PM EDT RP Workstation: [id number]   CT HEAD WO CONTRAST Result Date: 06/28/2024 EXAM: CT HEAD WITHOUT CONTRAST 06/28/2024 03:30:01 PM TECHNIQUE: CT of the head was performed without the administration of intravenous contrast. Automated exposure control, iterative reconstruction, and/or weight based adjustment of the mA/kV was utilized to reduce the radiation dose to as low as reasonably achievable. COMPARISON: CT head without contrast 08/31/2020. CLINICAL HISTORY: Head trauma, moderate-severe. Recently treated UTI with symptom improvement, now symptoms have returned of lower back pain, polyuria but only drops come out. Hx dementia, son at bedside. FINDINGS: BRAIN AND VENTRICLES: No acute hemorrhage. No evidence of acute infarct. No hydrocephalus. No extra-axial collection. No mass effect or midline shift. Moderate generalized atrophy and white matter changes are similar to the prior study. Minimal atherosclerotic changes are present within the cavernous internal carotid arteries bilaterally. No hyperdense vessel is present. ORBITS: Bilateral lens replacements are noted. The globes and orbits are otherwise within normal limits. SINUSES: No acute abnormality. SOFT TISSUES AND SKULL: No acute soft tissue abnormality. No skull fracture. IMPRESSION: 1. No acute intracranial abnormality. 2.  Moderate generalized atrophy and white matter changes, similar to the prior study. Electronically signed by: Lonni Necessary MD 06/28/2024 03:40 PM EDT RP Workstation: [id number]   Review of Systems  Unable to perform ROS: Mental status change   Blood pressure (!) 182/63, pulse 77, temperature 98.8 F (37.1 C), resp. rate 18, height 5' 10 (1.778 m), weight 80 kg, SpO2 98%. Physical Exam Vitals reviewed.  Constitutional:      General: She is not in acute distress.    Appearance: She is obese. She is ill-appearing. She is not toxic-appearing or diaphoretic.  HENT:     Head: Normocephalic and atraumatic.     Mouth/Throat:     Mouth: Mucous membranes are moist.  Cardiovascular:     Rate and Rhythm: Normal rate and regular rhythm.  Abdominal:     General: There is no distension.     Tenderness: There is no abdominal tenderness.  Musculoskeletal:     Cervical back: Neck supple.  Skin:    General: Skin is warm and dry.  Neurological:     General: No focal deficit present.  Psychiatric:        Mood and Affect: Mood is anxious.        Speech: Speech normal.        Behavior: Behavior is not agitated.   Assessment/Plan: 1) Symptomatic anemia with B12 deficiency and guaiac positive stools-plans are to do EGD and colonoscopy on 07/01/2024 and prep the patient for the procedure tomorrow. I was unable to procure her spot for her procedures tomorrow. Will allow her to have clear liquids for now. Agree with vitamin B12 supplementation. 2) Alzheimer's dementia. 3) Hypertension/hyperlipidemia. 4) Mild renal insufficiency. 5) Small hiatal hernia. 6) Multilevel level of degenerative changes of thoracolumbar spine without any acute fracture. 7) Retained stool on CT consistent with constipation. 8) Aortic atherosclerosis.  Renaye Sous 06/29/2024, 2:27 PM

## 2024-06-29 NOTE — Progress Notes (Signed)
 PROGRESS NOTE    Amy Andrade  FMW:[id number] DOB: 10-29-1935 DOA: 06/28/2024 PCP: [name], NP   Brief Narrative:  Amy Andrade is a 88 y.o. female with medical history significant for Alzheimer's dementia, HTN, HLD, chronic pain due to OA of both shoulders and chronic left shoulder dislocation who presented to the ED for evaluation of generalized weakness and no other complaint. She notes frequent urinary urgency.  She was recently treated for UTI initially with ciprofloxacin .  Urine culture grew Enterococcus faecalis and antibiotics were switched to nitrofurantoin  which she has since completed.  She has had suprapubic discomfort.  Upon arrival to ED, hemodynamically stable. Labs showed hemoglobin 6.9,LFTs within normal limits, troponin T 27 > 23.  FOBT positive.  Dark stool on DRE per EDP.  UA unremarkable.  Multiple CT scans were done including CT head, cervical spine, thoracic spine, lumbar spine, chest abdomen and pelvis and no significant acute pathology was found.   Patient was given 5 1 cc LR, IV Protonix 40 mg, and 1 unit PRBC transfusion.  EDP spoke with GI Dr. Burnette who recommended medical admission and will see in consultation.  The hospitalist service was consulted for admission.  Assessment & Plan:   Principal Problem:   Symptomatic anemia Active Problems:   Alzheimer disease (HCC)   Hyperlipidemia   Hypertension   Chronic hyponatremia  Symptomatic microcytic anemia/B12 deficiency: Suspect secondary to upper GI bleed.  FOBT is positive.  Hemoglobin 6.9 on admission compared to 9.9 this past June and previously 12.1 in June 2024.  No prior EGD on file.  Status post 1 unit of PRBC transfusion.  Hemoglobin 8.2.  Monitor every 12 hours.  Continue Protonix twice daily.  Patient belongs to Dr. Nola service and not [facility].  I have informed Dr. Kristie and requested consult.  Will keep her n.p.o. for now.  Also, she was found to have severe B12 deficiency.  Will  start replenishment.   Hypertension: Blood pressure elevated.  Continue amlodipine , resume irbesartan .   Hyperlipidemia: Continue pravastatin .   Chronic mild hyponatremia: Asymptomatic.  Continue monitor.   Urinary frequency/urgency: Recently treated for Enterococcus faecalis UTI.  UA with large leukocytes, 11-20 WBCs, rare bacteria.  Urine culture in process.   Alzheimer's dementia: Continue fall and delirium precautions.  Mild renal insufficiency: Patient's creatinine at baseline is around 0.7-0.8, presented with creatinine of 1.09.  Did not really meet criteria for AKI.  Renal function back to baseline.  DVT prophylaxis: SCDs Start: 06/28/24 1944   Code Status: Full Code  Family Communication:  None present at bedside.  Plan of care discussed with patient  Status is: Observation The patient will require care spanning > 2 midnights and should be moved to inpatient because: Needs evaluation by GI, may need EGD.   Estimated body mass index is 25.31 kg/m as calculated from the following:   Height as of this encounter: 5' 10 (1.778 m).   Weight as of this encounter: 80 kg.    Nutritional Assessment: Body mass index is 25.31 kg/m.Amy Andrade Seen by dietician.  I agree with the assessment and plan as outlined below: Nutrition Status:        . Skin Assessment: I have examined the patient's skin and I agree with the wound assessment as performed by the wound care RN as outlined below:    Consultants:  None  Procedures:  None  Antimicrobials:  Anti-infectives (From admission, onward)    None  Subjective: Patient seen and examined, nurse at the bedside.  Patient alert and mostly oriented.  The only question she could not answer was the year.  She has no complaints.  Objective: Vitals:   06/28/24 2342 06/28/24 2343 06/29/24 0424 06/29/24 0803  BP: (!) 165/70  (!) 151/63 (!) 182/63  Pulse: 83  79 77  Resp: 18  19 18   Temp: 97.6 F (36.4 C)  98.5 F (36.9  C) 98.8 F (37.1 C)  TempSrc: Oral  Oral   SpO2: 99%  98% 98%  Weight:  80 kg    Height:  5' 10 (1.778 m)      Intake/Output Summary (Last 24 hours) at 06/29/2024 0811 Last data filed at 06/29/2024 0600 Gross per 24 hour  Intake 1617.33 ml  Output 1050 ml  Net 567.33 ml   Filed Weights   06/28/24 2343  Weight: 80 kg    Examination:  General exam: Appears calm and comfortable  Respiratory system: Clear to auscultation. Respiratory effort normal. Cardiovascular system: S1 & S2 heard, RRR. No JVD, murmurs, rubs, gallops or clicks. No pedal edema. Gastrointestinal system: Abdomen is nondistended, soft and nontender. No organomegaly or masses felt. Normal bowel sounds heard. Central nervous system: Alert and oriented. No focal neurological deficits. Extremities: Symmetric 5 x 5 power. Skin: No rashes, lesions or ulcers   Data Reviewed: I have personally reviewed following labs and imaging studies  CBC: Recent Labs  Lab 06/28/24 1352 06/29/24 0524  WBC 7.6 7.2  NEUTROABS 5.5  --   HGB 6.9* 8.2*  HCT 24.0* 27.8*  MCV 73.4* 73.2*  PLT 471* 431*   Basic Metabolic Panel: Recent Labs  Lab 06/28/24 1352 06/29/24 0524  NA 131* 132*  K 4.4 4.2  CL 97* 100  CO2 23 23  GLUCOSE 108* 111*  BUN 23 13  CREATININE 1.09* 0.75  CALCIUM 9.6 9.1  MG 2.5*  --    GFR: Estimated Creatinine Clearance: 54.6 mL/min (by C-G formula based on SCr of 0.75 mg/dL). Liver Function Tests: Recent Labs  Lab 06/28/24 1352  AST 18  ALT 10  ALKPHOS 68  BILITOT 0.6  PROT 6.7  ALBUMIN 4.1   No results for input(s): LIPASE, AMYLASE in the last 168 hours. No results for input(s): AMMONIA in the last 168 hours. Coagulation Profile: Recent Labs  Lab 06/28/24 1352  INR 1.0   Cardiac Enzymes: No results for input(s): CKTOTAL, CKMB, CKMBINDEX, TROPONINI in the last 168 hours. BNP (last 3 results) No results for input(s): PROBNP in the last 8760 hours. HbA1C: No  results for input(s): HGBA1C in the last 72 hours. CBG: No results for input(s): GLUCAP in the last 168 hours. Lipid Profile: No results for input(s): CHOL, HDL, LDLCALC, TRIG, CHOLHDL, LDLDIRECT in the last 72 hours. Thyroid  Function Tests: No results for input(s): TSH, T4TOTAL, FREET4, T3FREE, THYROIDAB in the last 72 hours. Anemia Panel: Recent Labs    06/28/24 2128  VITAMINB12 <150*  FOLATE 10.4  FERRITIN 21  TIBC 395  IRON 57   Sepsis Labs: No results for input(s): PROCALCITON, LATICACIDVEN in the last 168 hours.  No results found for this or any previous visit (from the past 240 hours).   Radiology Studies: CT CHEST ABDOMEN PELVIS WO CONTRAST Result Date: 06/28/2024 CLINICAL DATA:  Polytrauma, blunt. Recently treated UTI. Low back pain. History of dementia. EXAM: CT CHEST, ABDOMEN AND PELVIS WITHOUT CONTRAST TECHNIQUE: Multidetector CT imaging of the chest, abdomen and pelvis was performed following the standard  protocol without IV contrast. RADIATION DOSE REDUCTION: This exam was performed according to the departmental dose-optimization program which includes automated exposure control, adjustment of the mA and/or kV according to patient size and/or use of iterative reconstruction technique. COMPARISON:  None Available. FINDINGS: CT CHEST FINDINGS Cardiovascular: The heart is mildly enlarged and there is a trace pericardial effusion. Scattered coronary artery calcifications are noted. There is atherosclerotic calcification of the aorta without evidence of aneurysm. The pulmonary trunk is normal in caliber. Mediastinum/Nodes: No mediastinal or axillary lymphadenopathy. Evaluation of the hila is limited due to lack of IV contrast. The trachea and esophagus are within normal limits. There is a small hiatal hernia. Lungs/Pleura: Atelectasis/scarring is present bilaterally. No effusion or pneumothorax is seen. Musculoskeletal: A fat containing Bochdalek hernia  is noted on the left. Degenerative changes are present in the thoracic spine. Destructive and degenerative changes are present at the glenohumeral joints bilaterally with a chronic subluxation. A joint effusion is present on the left. There is kyphosis of the thoracic spine. No acute fracture is seen. CT ABDOMEN PELVIS FINDINGS Hepatobiliary: No focal liver abnormality is seen. No gallstones, gallbladder wall thickening, or biliary dilatation. Pancreas: Unremarkable. No pancreatic ductal dilatation or surrounding inflammatory changes. Spleen: Normal in size without focal abnormality. Adrenals/Urinary Tract: The adrenal glands are within normal limits. No renal calculus or hydronephrosis bilaterally. The bladder is unremarkable. Stomach/Bowel: There is a small hiatal hernia. No bowel obstruction, free air, or pneumatosis is seen. A moderate amount of retained stool is noted in the colon. Appendix is not seen. Vascular/Lymphatic: Aortic atherosclerosis. No abdominal or pelvic lymphadenopathy. Reproductive: Status post hysterectomy. No adnexal masses. Other: No abdominopelvic ascites. A fat containing umbilical hernia is present. Musculoskeletal: Degenerative changes are noted in the lumbar spine and bilateral hips. There is ankylosis of the the T11 through L3 vertebral bodies. Spinal fusion hardware is noted at L4-L5. No acute fracture is seen. IMPRESSION: 1. No renal calculus or obstructive uropathy bilaterally. 2. Multilevel degenerative changes in the thoracolumbar spine without evidence of acute fracture. 3. Moderate amount of retained stool in the colon suggesting constipation. 4. Small hiatal hernia. 5. Aortic atherosclerosis and coronary artery calcifications. Electronically Signed   By: Leita Birmingham M.D.   On: 06/28/2024 16:02   CT T-SPINE NO CHARGE Result Date: 06/28/2024 EXAM: CT THORACIC SPINE WITHOUT CONTRAST 06/28/2024 03:30:01 PM TECHNIQUE: CT of the thoracic spine was performed without the  administration of intravenous contrast. Multiplanar reformatted images are provided for review. Automated exposure control, iterative reconstruction, and/or weight based adjustment of the mA/kV was utilized to reduce the radiation dose to as low as reasonably achievable. COMPARISON: None available. CLINICAL HISTORY: Recently treated UTI with symptom improvement, now symptoms have returned of lower back pain, polyuria but only drops come out. Hx dementia, son at bedside. FINDINGS: BONES AND ALIGNMENT: Normal vertebral body heights. Remote superior endplate fracture is present at T8. No acute fracture or suspicious bone lesion. Exaggerated cervical thoracic kyphosis was present. Rightward curvature centered at T11. DEGENERATIVE CHANGES: Ankylosis is present across the disc spaces at T11-T12 and T12-L1. SOFT TISSUES: No acute abnormality. IMPRESSION: 1. No acute findings. 2. Exaggerated cervicothoracic kyphosis. 3. Rightward curvature centered at T11. 4. Ankylosis across the disc spaces at T11-12 and T12-L1. 5. Remote superior endplate fracture at T8. Electronically signed by: Lonni Necessary MD 06/28/2024 03:48 PM EDT RP Workstation: [id number]   CT L-SPINE NO CHARGE Result Date: 06/28/2024 EXAM: CT OF THE LUMBAR SPINE WITHOUT CONTRAST 06/28/2024 03:30:01 PM TECHNIQUE: CT  of the lumbar spine was performed without the administration of intravenous contrast. Multiplanar reformatted images are provided for review. Automated exposure control, iterative reconstruction, and/or weight based adjustment of the mA/kV was utilized to reduce the radiation dose to as low as reasonably achievable. COMPARISON: MRI of the lumbar spine 07/28/2024. CLINICAL HISTORY: Recently treated UTI with symptom improvement, now symptoms have returned of lower back pain, polyuria but only drops come out. Hx dementia, son at bedside. FINDINGS: BONES AND ALIGNMENT: Normal vertebral body heights. Solid fusion is present at L4-L5. An ankylosis  of lower thoracic spine extends to L3. Chronic sclerotic changes are present at L3-L4. No acute fracture or suspicious bone lesion. Normal alignment. DEGENERATIVE CHANGES: Moderate central and right greater than left foraminal stenosis is present at L3-L4. Moderate left foraminal stenosis is present at L5-S1. SOFT TISSUES: Atherosclerotic changes are present in the aorta and branch vessels without aneurysm. A small hiatal hernia is present. No acute abnormality. IMPRESSION: 1. No acute findings. 2. Solid fusion at L4-5 and ankylosis of lower thoracic spine extending to L3. 3. Chronic sclerotic changes at L3-4 with moderate central and right greater than left foraminal stenosis. 4. Moderate left foraminal stenosis at L5-S1. Electronically signed by: Lonni Necessary MD 06/28/2024 03:46 PM EDT RP Workstation: [id number]   CT CERVICAL SPINE WO CONTRAST Result Date: 06/28/2024 EXAM: CT CERVICAL SPINE WITHOUT CONTRAST 06/28/2024 03:30:01 PM TECHNIQUE: CT of the cervical spine was performed without the administration of intravenous contrast. Multiplanar reformatted images are provided for review. Automated exposure control, iterative reconstruction, and/or weight based adjustment of the mA/kV was utilized to reduce the radiation dose to as low as reasonably achievable. COMPARISON: CT of the cervical spine 08/10/2008. CLINICAL HISTORY: Polytrauma, blunt. Recently treated UTI with symptom improvement, now symptoms have returned of lower back pain, polyuria but only drops come out. Hx dementia, son at bedside. FINDINGS: CERVICAL SPINE: BONES AND ALIGNMENT: Straightening of the normal cervical lordosis is again noted. Ankylosis is present across the disc spaces C2-C6. No acute fracture or traumatic malalignment. DEGENERATIVE CHANGES: Ankylosis is present across the disc spaces C2-C6. SOFT TISSUES: No prevertebral soft tissue swelling. IMPRESSION: 1. No acute abnormality of the cervical spine. 2. Ankylosis across the  C2C6 disc spaces. Electronically signed by: Lonni Necessary MD 06/28/2024 03:42 PM EDT RP Workstation: [id number]   CT HEAD WO CONTRAST Result Date: 06/28/2024 EXAM: CT HEAD WITHOUT CONTRAST 06/28/2024 03:30:01 PM TECHNIQUE: CT of the head was performed without the administration of intravenous contrast. Automated exposure control, iterative reconstruction, and/or weight based adjustment of the mA/kV was utilized to reduce the radiation dose to as low as reasonably achievable. COMPARISON: CT head without contrast 08/31/2020. CLINICAL HISTORY: Head trauma, moderate-severe. Recently treated UTI with symptom improvement, now symptoms have returned of lower back pain, polyuria but only drops come out. Hx dementia, son at bedside. FINDINGS: BRAIN AND VENTRICLES: No acute hemorrhage. No evidence of acute infarct. No hydrocephalus. No extra-axial collection. No mass effect or midline shift. Moderate generalized atrophy and white matter changes are similar to the prior study. Minimal atherosclerotic changes are present within the cavernous internal carotid arteries bilaterally. No hyperdense vessel is present. ORBITS: Bilateral lens replacements are noted. The globes and orbits are otherwise within normal limits. SINUSES: No acute abnormality. SOFT TISSUES AND SKULL: No acute soft tissue abnormality. No skull fracture. IMPRESSION: 1. No acute intracranial abnormality. 2. Moderate generalized atrophy and white matter changes, similar to the prior study. Electronically signed by: Lonni Necessary MD 06/28/2024 03:40 PM EDT  RP Workstation: [id number]    Scheduled Meds:  amLODipine   10 mg Oral Daily   irbesartan   300 mg Oral Daily   pantoprazole (PROTONIX) IV  40 mg Intravenous Q12H   pravastatin   40 mg Oral Daily   Continuous Infusions:   LOS: 0 days   Fredia Skeeter, MD [facility]  06/29/2024, 8:11 AM   *Please note that this is a verbal dictation therefore any spelling or grammatical  errors are due to the Dragon Medical One system interpretation.  Please page via Amion and do not message via secure chat for urgent patient care matters. Secure chat can be used for non urgent patient care matters.  How to contact the [facility] Attending or Consulting provider 7A - 7P or covering provider during after hours 7P -7A, for this patient?  Check the care team in [facility] and look for a) attending/consulting [facility] provider listed and b) the [facility] team listed. Page or secure chat 7A-7P. Log into [email or web address] and use [facility]'s universal password to access. If you do not have the password, please contact the hospital operator. Locate the [facility] provider you are looking for under [facility] and page to a number that you can be directly reached. If you still have difficulty reaching the provider, please page the [facility] (Director on Call) for the Hospitalists listed on amion for assistance.

## 2024-06-30 DIAGNOSIS — R933 Abnormal findings on diagnostic imaging of other parts of digestive tract: Secondary | ICD-10-CM | POA: Diagnosis not present

## 2024-06-30 DIAGNOSIS — D510 Vitamin B12 deficiency anemia due to intrinsic factor deficiency: Secondary | ICD-10-CM | POA: Diagnosis not present

## 2024-06-30 DIAGNOSIS — D649 Anemia, unspecified: Secondary | ICD-10-CM | POA: Diagnosis not present

## 2024-06-30 DIAGNOSIS — K449 Diaphragmatic hernia without obstruction or gangrene: Secondary | ICD-10-CM | POA: Diagnosis not present

## 2024-06-30 DIAGNOSIS — D500 Iron deficiency anemia secondary to blood loss (chronic): Secondary | ICD-10-CM | POA: Diagnosis not present

## 2024-06-30 LAB — CBC WITH DIFFERENTIAL/PLATELET
Abs Immature Granulocytes: 0.04 K/uL (ref 0.00–0.07)
Basophils Absolute: 0 K/uL (ref 0.0–0.1)
Basophils Relative: 0 %
Eosinophils Absolute: 0.1 K/uL (ref 0.0–0.5)
Eosinophils Relative: 1 %
HCT: 32.5 % — ABNORMAL LOW (ref 36.0–46.0)
Hemoglobin: 9.4 g/dL — ABNORMAL LOW (ref 12.0–15.0)
Immature Granulocytes: 1 %
Lymphocytes Relative: 20 %
Lymphs Abs: 1.4 K/uL (ref 0.7–4.0)
MCH: 21.2 pg — ABNORMAL LOW (ref 26.0–34.0)
MCHC: 28.9 g/dL — ABNORMAL LOW (ref 30.0–36.0)
MCV: 73.4 fL — ABNORMAL LOW (ref 80.0–100.0)
Monocytes Absolute: 0.7 K/uL (ref 0.1–1.0)
Monocytes Relative: 10 %
Neutro Abs: 4.7 K/uL (ref 1.7–7.7)
Neutrophils Relative %: 68 %
Platelets: 462 K/uL — ABNORMAL HIGH (ref 150–400)
RBC: 4.43 MIL/uL (ref 3.87–5.11)
RDW: 17.8 % — ABNORMAL HIGH (ref 11.5–15.5)
WBC: 6.9 K/uL (ref 4.0–10.5)
nRBC: 0 % (ref 0.0–0.2)

## 2024-06-30 MED ORDER — SODIUM CHLORIDE 0.9 % IV SOLN: INTRAVENOUS | Status: DC

## 2024-06-30 MED ORDER — PEG 3350-KCL-NA BICARB-NACL 420 G PO SOLR
4000.0000 mL | Freq: Once | ORAL | Status: AC
  Administered 2024-06-30: 4000 mL via ORAL

## 2024-06-30 NOTE — Progress Notes (Signed)
 The patient NuLytely  prep was initiated at 1515. She has completed half of the container. Will continue to encourage.

## 2024-06-30 NOTE — Progress Notes (Addendum)
 Patient not in waist restraint this am on am assessment. Per night RN restraint was not initiated.

## 2024-06-30 NOTE — Evaluation (Signed)
 Occupational Therapy Evaluation Patient Details Name: Amy Andrade MRN: [id number] DOB: [birth year] Today's Date: 06/30/2024   History of Present Illness   88 yo femals brought to ED 06/28/24 with weakness, anemia, FOBT, severe, B12 deficiency PMH includes Alzheimer dementia, asthma, chronic pain, HTN.chronic pain due to OA of both shoulders and chronic left shoulder dislocation     Clinical Impressions Pt presents with decline in function and safety with ADLs and ADL mobility with impaired strength, balance, endurance B shoulder AROM and hx of demential. PTA pt lives with her son and daughter in law, reports that she is Ind with grooming, dressing, toileting ad daughter in las assists her with bathing, uses a RW for mobility. Pt currently requires min A to sit EOB, min A with UB ADL, max-total A with LB ADLs, total A with toileting tasks, mod A STS/SPTs to [facility] and chair. Pt required cues for hand placemet, pt trying to sit prematurely and could not control speed of descent. OT will follow acutely to maximize level of function and safety     If plan is discharge home, recommend the following:   A lot of help with bathing/dressing/bathroom;A lot of help with walking and/or transfers;Assist for transportation;Help with stairs or ramp for entrance     Functional Status Assessment   Patient has had a recent decline in their functional status and demonstrates the ability to make significant improvements in function in a reasonable and predictable amount of time.     Equipment Recommendations   Tub/shower seat     Recommendations for Other Services         Precautions/Restrictions   Precautions Precautions: Fall Precaution/Restrictions Comments: severe  bilateral shoulder AROM impaired, 0-60 degrees FF Restrictions Weight Bearing Restrictions Per Provider Order: No     Mobility Bed Mobility Overal bed mobility: Needs Assistance Bed Mobility: Supine to Sit, Sit to  Supine     Supine to sit: Min assist, HOB elevated Sit to supine: Min assist   General bed mobility comments: assist to elevatetrunk and with LEs back onto bed    Transfers Overall transfer level: Needs assistance Equipment used: Rolling walker (2 wheels) Transfers: Sit to/from Stand, Bed to chair/wheelchair/BSC Sit to Stand: Mod assist     Step pivot transfers: Mod assist     General transfer comment: cues for hand placemet, pt trying to sit prematurely and could not control speed of descent      Balance Overall balance assessment: Needs assistance Sitting-balance support: Bilateral upper extremity supported, Feet supported Sitting balance-Leahy Scale: Fair     Standing balance support: Bilateral upper extremity supported, During functional activity Standing balance-Leahy Scale: Poor                             ADL either performed or assessed with clinical judgement   ADL Overall ADL's : Needs assistance/impaired Eating/Feeding: Set up;Independent;Sitting;Bed level   Grooming: Wash/dry hands;Wash/dry face;Brushing hair;Contact guard assist;Bed level   Upper Body Bathing: Minimal assistance;Sitting   Lower Body Bathing: Maximal assistance   Upper Body Dressing : Minimal assistance;Sitting   Lower Body Dressing: Total assistance   Toilet Transfer: Moderate assistance;Rolling walker (2 wheels);Cueing for safety;Cueing for sequencing;Stand-pivot;BSC/3in1   Toileting- Clothing Manipulation and Hygiene: Total assistance;Sitting/lateral lean;Sit to/from stand       Functional mobility during ADLs: Moderate assistance;Rolling walker (2 wheels);Cueing for safety;Cueing for sequencing General ADL Comments: assist wih sponhe baths at baseline     Vision  Baseline Vision/History: 1 Wears glasses Ability to See in Adequate Light: 0 Adequate Patient Visual Report: No change from baseline       Perception         Praxis         Pertinent  Vitals/Pain Pain Assessment Pain Assessment: No/denies pain     Extremity/Trunk Assessment Upper Extremity Assessment Upper Extremity Assessment: Generalized weakness;Right hand dominant;RUE deficits/detail;LUE deficits/detail RUE Deficits / Details: limited  shoulder  flex LUE Deficits / Details: limited shoulder flex   Lower Extremity Assessment Lower Extremity Assessment: Defer to PT evaluation   Cervical / Trunk Assessment Cervical / Trunk Assessment: Normal   Communication Communication Communication: No apparent difficulties   Cognition Arousal: Alert Behavior During Therapy: WFL for tasks assessed/performed Cognition: No family/caregiver present to determine baseline, Cognition impaired   Orientation impairments: Place, Situation   Memory impairment (select all impairments): Short-term memory     OT - Cognition Comments: pt  stated that he hs meeory issues for a lttle while                 Following commands: Intact, Impaired Following commands impaired: Follows multi-step commands inconsistently     Cueing  General Comments   Cueing Techniques: Gestural cues;Tactile cues      Exercises     Shoulder Instructions      Home Living Family/patient expects to be discharged to:: Private residence Living Arrangements: Other relatives;Children Available Help at Discharge: Family;Available 24 hours/day Type of Home: House       Home Layout: Able to live on main level with bedroom/bathroom     Bathroom Shower/Tub: Arts development officer Toilet: Handicapped height     Home Equipment: Agricultural consultant (2 wheels);Shower seat;Wheelchair - manual;Shower seat - built in   Additional Comments: son present for info, lift chair.      Prior Functioning/Environment               Mobility Comments: has been ambulatory with RW and no assistance ADLs Comments: Ind with ADLs, toileting, sponge bathes with daughter in law assist, has been able to go  to BR with RW.    OT Problem List: Decreased strength;Decreased range of motion;Decreased activity tolerance;Decreased cognition;Decreased safety awareness;Impaired balance (sitting and/or standing);Decreased knowledge of use of DME or AE   OT Treatment/Interventions: Self-care/ADL training;Therapeutic exercise;Patient/family education;Balance training;Neuromuscular education;Therapeutic activities;DME and/or AE instruction      OT Goals(Current goals can be found in the care plan section)   Acute Rehab OT Goals Patient Stated Goal: go home OT Goal Formulation: With patient Time For Goal Achievement: 07/14/24 Potential to Achieve Goals: Good ADL Goals Pt Will Perform Grooming: with supervision;with set-up;sitting Pt Will Perform Upper Body Bathing: with contact guard assist;sitting;with caregiver independent in assisting Pt Will Perform Lower Body Bathing: with mod assist;with min assist;sitting/lateral leans;sit to/from stand;with caregiver independent in assisting Pt Will Perform Upper Body Dressing: with contact guard assist;sitting;with caregiver independent in assisting Pt Will Transfer to Toilet: with min assist;with contact guard assist;ambulating;stand pivot transfer Pt Will Perform Toileting - Clothing Manipulation and hygiene: with max assist;with mod assist;sitting/lateral leans;sit to/from stand;with caregiver independent in assisting   OT Frequency:  Min 2X/week    Co-evaluation              AM-PAC OT 6 Clicks Daily Activity     Outcome Measure Help from another person eating meals?: None Help from another person taking care of personal grooming?: A Little Help from another person toileting,  which includes using toliet, bedpan, or urinal?: Total Help from another person bathing (including washing, rinsing, drying)?: A Lot Help from another person to put on and taking off regular upper body clothing?: A Little Help from another person to put on and taking off  regular lower body clothing?: Total 6 Click Score: 14   End of Session Equipment Utilized During Treatment: Gait belt;Rolling walker (2 wheels);Other (comment) [facility]) Nurse Communication: Mobility status  Activity Tolerance: Patient tolerated treatment well Patient left: in bed;with call bell/phone within reach;with bed alarm set  OT Visit Diagnosis: Unsteadiness on feet (R26.81);Other abnormalities of gait and mobility (R26.89);Muscle weakness (generalized) (M62.81)                Time: 8645-8580 OT Time Calculation (min): 25 min Charges:  OT General Charges $OT Visit: 1 Visit OT Evaluation $OT Eval Moderate Complexity: 1 Mod OT Treatments $Therapeutic Activity: 8-22 mins    Jacques Karna Loose 06/30/2024, 2:33 PM

## 2024-06-30 NOTE — Progress Notes (Signed)
 Subjective: No complaints.  Objective: Vital signs in last 24 hours: Temp:  [97.7 F (36.5 C)-98.8 F (37.1 C)] 97.7 F (36.5 C) (10/14 0540) Pulse Rate:  [63-76] 64 (10/14 0855) Resp:  [16-24] 16 (10/14 0855) BP: (132-172)/(60-79) 132/60 (10/14 0855) SpO2:  [99 %-100 %] 100 % (10/14 0855) Last BM Date : 06/29/24  Intake/Output from previous day: 10/13 0701 - 10/14 0700 In: 510 [P.O.:510] Out: 1250 [Urine:1250] Intake/Output this shift: Total I/O In: 240 [P.O.:240] Out: 300 [Urine:300]  General appearance: alert and no distress GI: soft, non-tender; bowel sounds normal; no masses,  no organomegaly  Lab Results: Recent Labs    06/28/24 1352 06/29/24 0524 06/30/24 0844  WBC 7.6 7.2 6.9  HGB 6.9* 8.2* 9.4*  HCT 24.0* 27.8* 32.5*  PLT 471* 431* 462*   BMET Recent Labs    06/28/24 1352 06/29/24 0524  NA 131* 132*  K 4.4 4.2  CL 97* 100  CO2 23 23  GLUCOSE 108* 111*  BUN 23 13  CREATININE 1.09* 0.75  CALCIUM 9.6 9.1   LFT Recent Labs    06/28/24 1352  PROT 6.7  ALBUMIN 4.1  AST 18  ALT 10  ALKPHOS 68  BILITOT 0.6   PT/INR Recent Labs    06/28/24 1352  LABPROT 13.6  INR 1.0   Hepatitis Panel No results for input(s): HEPBSAG, HCVAB, HEPAIGM, HEPBIGM in the last 72 hours. C-Diff No results for input(s): CDIFFTOX in the last 72 hours. Fecal Lactopherrin No results for input(s): FECLLACTOFRN in the last 72 hours.  Studies/Results: CT CHEST ABDOMEN PELVIS WO CONTRAST Result Date: 06/28/2024 CLINICAL DATA:  Polytrauma, blunt. Recently treated UTI. Low back pain. History of dementia. EXAM: CT CHEST, ABDOMEN AND PELVIS WITHOUT CONTRAST TECHNIQUE: Multidetector CT imaging of the chest, abdomen and pelvis was performed following the standard protocol without IV contrast. RADIATION DOSE REDUCTION: This exam was performed according to the departmental dose-optimization program which includes automated exposure control, adjustment of the mA  and/or kV according to patient size and/or use of iterative reconstruction technique. COMPARISON:  None Available. FINDINGS: CT CHEST FINDINGS Cardiovascular: The heart is mildly enlarged and there is a trace pericardial effusion. Scattered coronary artery calcifications are noted. There is atherosclerotic calcification of the aorta without evidence of aneurysm. The pulmonary trunk is normal in caliber. Mediastinum/Nodes: No mediastinal or axillary lymphadenopathy. Evaluation of the hila is limited due to lack of IV contrast. The trachea and esophagus are within normal limits. There is a small hiatal hernia. Lungs/Pleura: Atelectasis/scarring is present bilaterally. No effusion or pneumothorax is seen. Musculoskeletal: A fat containing Bochdalek hernia is noted on the left. Degenerative changes are present in the thoracic spine. Destructive and degenerative changes are present at the glenohumeral joints bilaterally with a chronic subluxation. A joint effusion is present on the left. There is kyphosis of the thoracic spine. No acute fracture is seen. CT ABDOMEN PELVIS FINDINGS Hepatobiliary: No focal liver abnormality is seen. No gallstones, gallbladder wall thickening, or biliary dilatation. Pancreas: Unremarkable. No pancreatic ductal dilatation or surrounding inflammatory changes. Spleen: Normal in size without focal abnormality. Adrenals/Urinary Tract: The adrenal glands are within normal limits. No renal calculus or hydronephrosis bilaterally. The bladder is unremarkable. Stomach/Bowel: There is a small hiatal hernia. No bowel obstruction, free air, or pneumatosis is seen. A moderate amount of retained stool is noted in the colon. Appendix is not seen. Vascular/Lymphatic: Aortic atherosclerosis. No abdominal or pelvic lymphadenopathy. Reproductive: Status post hysterectomy. No adnexal masses. Other: No abdominopelvic ascites. A  fat containing umbilical hernia is present. Musculoskeletal: Degenerative changes are  noted in the lumbar spine and bilateral hips. There is ankylosis of the the T11 through L3 vertebral bodies. Spinal fusion hardware is noted at L4-L5. No acute fracture is seen. IMPRESSION: 1. No renal calculus or obstructive uropathy bilaterally. 2. Multilevel degenerative changes in the thoracolumbar spine without evidence of acute fracture. 3. Moderate amount of retained stool in the colon suggesting constipation. 4. Small hiatal hernia. 5. Aortic atherosclerosis and coronary artery calcifications. Electronically Signed   By: Leita Birmingham M.D.   On: 06/28/2024 16:02   CT T-SPINE NO CHARGE Result Date: 06/28/2024 EXAM: CT THORACIC SPINE WITHOUT CONTRAST 06/28/2024 03:30:01 PM TECHNIQUE: CT of the thoracic spine was performed without the administration of intravenous contrast. Multiplanar reformatted images are provided for review. Automated exposure control, iterative reconstruction, and/or weight based adjustment of the mA/kV was utilized to reduce the radiation dose to as low as reasonably achievable. COMPARISON: None available. CLINICAL HISTORY: Recently treated UTI with symptom improvement, now symptoms have returned of lower back pain, polyuria but only drops come out. Hx dementia, son at bedside. FINDINGS: BONES AND ALIGNMENT: Normal vertebral body heights. Remote superior endplate fracture is present at T8. No acute fracture or suspicious bone lesion. Exaggerated cervical thoracic kyphosis was present. Rightward curvature centered at T11. DEGENERATIVE CHANGES: Ankylosis is present across the disc spaces at T11-T12 and T12-L1. SOFT TISSUES: No acute abnormality. IMPRESSION: 1. No acute findings. 2. Exaggerated cervicothoracic kyphosis. 3. Rightward curvature centered at T11. 4. Ankylosis across the disc spaces at T11-12 and T12-L1. 5. Remote superior endplate fracture at T8. Electronically signed by: Lonni Necessary MD 06/28/2024 03:48 PM EDT RP Workstation: [id number]   CT L-SPINE NO  CHARGE Result Date: 06/28/2024 EXAM: CT OF THE LUMBAR SPINE WITHOUT CONTRAST 06/28/2024 03:30:01 PM TECHNIQUE: CT of the lumbar spine was performed without the administration of intravenous contrast. Multiplanar reformatted images are provided for review. Automated exposure control, iterative reconstruction, and/or weight based adjustment of the mA/kV was utilized to reduce the radiation dose to as low as reasonably achievable. COMPARISON: MRI of the lumbar spine 07/28/2024. CLINICAL HISTORY: Recently treated UTI with symptom improvement, now symptoms have returned of lower back pain, polyuria but only drops come out. Hx dementia, son at bedside. FINDINGS: BONES AND ALIGNMENT: Normal vertebral body heights. Solid fusion is present at L4-L5. An ankylosis of lower thoracic spine extends to L3. Chronic sclerotic changes are present at L3-L4. No acute fracture or suspicious bone lesion. Normal alignment. DEGENERATIVE CHANGES: Moderate central and right greater than left foraminal stenosis is present at L3-L4. Moderate left foraminal stenosis is present at L5-S1. SOFT TISSUES: Atherosclerotic changes are present in the aorta and branch vessels without aneurysm. A small hiatal hernia is present. No acute abnormality. IMPRESSION: 1. No acute findings. 2. Solid fusion at L4-5 and ankylosis of lower thoracic spine extending to L3. 3. Chronic sclerotic changes at L3-4 with moderate central and right greater than left foraminal stenosis. 4. Moderate left foraminal stenosis at L5-S1. Electronically signed by: Lonni Necessary MD 06/28/2024 03:46 PM EDT RP Workstation: [id number]   CT CERVICAL SPINE WO CONTRAST Result Date: 06/28/2024 EXAM: CT CERVICAL SPINE WITHOUT CONTRAST 06/28/2024 03:30:01 PM TECHNIQUE: CT of the cervical spine was performed without the administration of intravenous contrast. Multiplanar reformatted images are provided for review. Automated exposure control, iterative reconstruction, and/or weight  based adjustment of the mA/kV was utilized to reduce the radiation dose to as low as reasonably achievable. COMPARISON: CT  of the cervical spine 08/10/2008. CLINICAL HISTORY: Polytrauma, blunt. Recently treated UTI with symptom improvement, now symptoms have returned of lower back pain, polyuria but only drops come out. Hx dementia, son at bedside. FINDINGS: CERVICAL SPINE: BONES AND ALIGNMENT: Straightening of the normal cervical lordosis is again noted. Ankylosis is present across the disc spaces C2-C6. No acute fracture or traumatic malalignment. DEGENERATIVE CHANGES: Ankylosis is present across the disc spaces C2-C6. SOFT TISSUES: No prevertebral soft tissue swelling. IMPRESSION: 1. No acute abnormality of the cervical spine. 2. Ankylosis across the C2C6 disc spaces. Electronically signed by: Lonni Necessary MD 06/28/2024 03:42 PM EDT RP Workstation: [id number]   CT HEAD WO CONTRAST Result Date: 06/28/2024 EXAM: CT HEAD WITHOUT CONTRAST 06/28/2024 03:30:01 PM TECHNIQUE: CT of the head was performed without the administration of intravenous contrast. Automated exposure control, iterative reconstruction, and/or weight based adjustment of the mA/kV was utilized to reduce the radiation dose to as low as reasonably achievable. COMPARISON: CT head without contrast 08/31/2020. CLINICAL HISTORY: Head trauma, moderate-severe. Recently treated UTI with symptom improvement, now symptoms have returned of lower back pain, polyuria but only drops come out. Hx dementia, son at bedside. FINDINGS: BRAIN AND VENTRICLES: No acute hemorrhage. No evidence of acute infarct. No hydrocephalus. No extra-axial collection. No mass effect or midline shift. Moderate generalized atrophy and white matter changes are similar to the prior study. Minimal atherosclerotic changes are present within the cavernous internal carotid arteries bilaterally. No hyperdense vessel is present. ORBITS: Bilateral lens replacements are noted. The  globes and orbits are otherwise within normal limits. SINUSES: No acute abnormality. SOFT TISSUES AND SKULL: No acute soft tissue abnormality. No skull fracture. IMPRESSION: 1. No acute intracranial abnormality. 2. Moderate generalized atrophy and white matter changes, similar to the prior study. Electronically signed by: Lonni Necessary MD 06/28/2024 03:40 PM EDT RP Workstation: [id number]    Medications: Scheduled:  amLODipine   10 mg Oral Daily   cyanocobalamin  1,000 mcg Intramuscular Q48H   irbesartan   300 mg Oral Daily   pantoprazole (PROTONIX) IV  40 mg Intravenous Q12H   polyethylene glycol  17 g Oral Daily   polyethylene glycol-electrolytes  4,000 mL Oral Once   pravastatin   40 mg Oral Daily   Continuous:  Assessment/Plan: 1) Anemia. 2) Heme positive stool. 3) Restricted mobility. 4) Alzheimer's dementia.   The patient's anemia improved with the blood transfusions.  The plan is to perform an EGD/colonoscopy tomorrow. It is uncertain if she will be able to properly prep.   A rectal tube will be used to aid the prep as she has severely limited mobility.  Plan: 1) Golytely  4 liter prep. 2) Rectal tube for the prep. 3) EGD/colonoscopy tomorrow.  LOS: 0 days   Amyra Vantuyl D 06/30/2024, 1:37 PM

## 2024-06-30 NOTE — Plan of Care (Signed)
  Problem: Clinical Measurements: Goal: Ability to maintain clinical measurements within normal limits will improve Outcome: Progressing Goal: Will remain free from infection Outcome: Progressing Goal: Diagnostic test results will improve Outcome: Progressing   Problem: Elimination: Goal: Will not experience complications related to urinary retention Outcome: Progressing   Problem: Pain Managment: Goal: General experience of comfort will improve and/or be controlled Outcome: Progressing   Problem: Safety: Goal: Ability to remain free from injury will improve Outcome: Progressing   Problem: Skin Integrity: Goal: Risk for impaired skin integrity will decrease Outcome: Progressing

## 2024-06-30 NOTE — Progress Notes (Signed)
-----------------------------------------------------------  [facility] D(Data) A(Action) R(response)     Data: Restraint Charting Assessment    Action: Secure Chat with primary team    Response: Chat acknowledged and made aware pt. Has not been restrained during since 0700 per primary RN.     Jacy Howat, RN [employer]

## 2024-06-30 NOTE — Evaluation (Signed)
 Physical Therapy Evaluation Patient Details Name: Amy Andrade MRN: [id number] DOB: [birth year] Today's Date: 06/30/2024  History of Present Illness  88 yo femals brought to ED 06/28/24 with weakness, anemia, FOBT, severe, B12 deficiency PMH includes Alzheimer dementia, asthma, chronic pain, HTN.chronic pain due to OA of both shoulders and chronic left shoulder dislocation  Clinical Impression  Pt admitted with above diagnosis.  Pt currently with functional limitations due to the deficits listed below (see PT Problem List). Pt will benefit from acute skilled PT to increase their independence and safety with mobility to allow discharge.     The patient is alert, oriented x self son and place. 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 Son provides  information  related to  prior function. Patient was ambulatory with RW  in the home, to the bathroom modified independent. Has bee having assistance for  sponge bath.  Patient's son pleased that patient able to stand and step to recliner. Son reports if patient is consistently able to transfer with 1 person, patient can return home. Son requesting additional assistance  , defer to [facility]. Recommend HHPT and and aide. 'Son requesting  a hospital bed.  Patient has other DME needs met.      If plan is discharge home, recommend the following: A lot of help with walking and/or transfers;A little help with bathing/dressing/bathroom;Help with stairs or ramp for entrance;Assist for transportation;Direct supervision/assist for financial management   Can travel by private vehicle        Equipment Recommendations Hospital bed  Recommendations for Other Services       Functional Status Assessment Patient has had a recent decline in their functional status and demonstrates the ability to make significant improvements in function in a reasonable and predictable amount of time.     Precautions / Restrictions Precautions Precautions:  Fall Precaution/Restrictions Comments: severe  bilateral shoulder      Mobility  Bed Mobility Overal bed mobility: Needs Assistance Bed Mobility: Supine to Sit     Supine to sit: Min assist, HOB elevated     General bed mobility comments: assist  to scoot to bed edge with    pad and momentum    Transfers Overall transfer level: Needs assistance Equipment used: Rolling walker (2 wheels) Transfers: Sit to/from Stand, Bed to chair/wheelchair/BSC Sit to Stand: Mod assist   Step pivot transfers: Mod assist       General transfer comment: assist to rise from bed , abl;e to place hand on Rw and stand  up, Able to step to  reclienr. Decreased control os sitting down, unable to use the ue's, patient has a lift chair. Mod assist to stand from recliner again.    Ambulation/Gait                  Stairs            Wheelchair Mobility     Tilt Bed    Modified Rankin (Stroke Patients Only)       Balance Overall balance assessment: Needs assistance Sitting-balance support: Bilateral upper extremity supported, Feet supported Sitting balance-Leahy Scale: Fair     Standing balance support: Bilateral upper extremity supported, During functional activity Standing balance-Leahy Scale: Poor                               Pertinent Vitals/Pain Pain Assessment Breathing: occasional labored breathing, short period of hyperventilation Negative Vocalization: occasional  moan/groan, low speech, negative/disapproving quality Facial Expression: smiling or inexpressive Body Language: relaxed Consolability: no need to console PAINAD Score: 2    Home Living Family/patient expects to be discharged to:: Private residence Living Arrangements: Other relatives;Children Available Help at Discharge: Family;Available 24 hours/day Type of Home: House         Home Layout: Able to live on main level with bedroom/bathroom Home Equipment: Rolling Walker (2  wheels);Shower seat;Wheelchair - manual;Shower seat - built in Additional Comments: son present for info, lift chair.    Prior Function               Mobility Comments: has been ambulatory with RW and no assistance ADLs Comments: recnetly sponge bathes, has been able to go to BR with RW.     Extremity/Trunk Assessment   Upper Extremity Assessment Upper Extremity Assessment: LUE deficits/detail;RUE deficits/detail RUE Deficits / Details: limited  shoulder  flex LUE Deficits / Details: limited shoulder flex    Lower Extremity Assessment Lower Extremity Assessment: Generalized weakness    Cervical / Trunk Assessment Cervical / Trunk Assessment: Normal  Communication   Communication Communication: No apparent difficulties    Cognition Arousal: Alert Behavior During Therapy: WFL for tasks assessed/performed   PT - Cognitive impairments: History of cognitive impairments                       PT - Cognition Comments: Oriented to [facility], self  and  son. has been doing crossword puzzle in room. Following commands: Intact, Impaired       Cueing Cueing Techniques: Gestural cues, Tactile cues     General Comments      Exercises General Exercises - Lower Extremity Ankle Circles/Pumps: AROM, Both Long Arc Quad: AROM, Both, 10 reps Hip Flexion/Marching: AROM, Both, 10 reps   Assessment/Plan    PT Assessment Patient needs continued PT services  PT Problem List Decreased strength       PT Treatment Interventions DME instruction;Patient/family education;Gait training;Functional mobility training;Therapeutic activities;Therapeutic exercise;Cognitive remediation    PT Goals (Current goals can be found in the Care Plan section)  Acute Rehab PT Goals Patient Stated Goal: patient to be able to stand and transfer PT Goal Formulation: With family Time For Goal Achievement: 07/14/24 Potential to Achieve Goals: Good    Frequency Min 3X/week     Co-evaluation                AM-PAC PT 6 Clicks Mobility  Outcome Measure Help needed turning from your back to your side while in a flat bed without using bedrails?: A Little Help needed moving from lying on your back to sitting on the side of a flat bed without using bedrails?: A Lot Help needed moving to and from a bed to a chair (including a wheelchair)?: A Lot Help needed standing up from a chair using your arms (e.g., wheelchair or bedside chair)?: A Lot Help needed to walk in hospital room?: Total Help needed climbing 3-5 steps with a railing? : Total 6 Click Score: 11    End of Session Equipment Utilized During Treatment: Gait belt Activity Tolerance: Patient tolerated treatment well Patient left: in chair;with call bell/phone within reach;with chair alarm set;with family/visitor present Nurse Communication: Mobility status PT Visit Diagnosis: Unsteadiness on feet (R26.81)    Time: 8859-8790 PT Time Calculation (min) (ACUTE ONLY): 29 min   Charges:   PT Evaluation $PT Eval Low Complexity: 1 Low PT Treatments $Therapeutic Activity: 8-22 mins PT  General Charges $$ ACUTE PT VISIT: 1 Visit         Darice Potters PT Acute Rehabilitation Services Office [phone]   Potters Darice Norris 06/30/2024, 1:26 PM

## 2024-06-30 NOTE — Progress Notes (Signed)
 PROGRESS NOTE    Keliyah Spillman Siravo  FMW:[id number] DOB: 1935-12-02 DOA: 06/28/2024 PCP: [name], NP   Brief Narrative:  Amy Andrade is a 88 y.o. female with medical history significant for Alzheimer's dementia, HTN, HLD, chronic pain due to OA of both shoulders and chronic left shoulder dislocation who presented to the ED for evaluation of generalized weakness and no other complaint. She notes frequent urinary urgency.  She was recently treated for UTI initially with ciprofloxacin .  Urine culture grew Enterococcus faecalis and antibiotics were switched to nitrofurantoin  which she has since completed.  She has had suprapubic discomfort.  Upon arrival to ED, hemodynamically stable. Labs showed hemoglobin 6.9,LFTs within normal limits, troponin T 27 > 23.  FOBT positive.  Dark stool on DRE per EDP.  UA unremarkable.  Multiple CT scans were done including CT head, cervical spine, thoracic spine, lumbar spine, chest abdomen and pelvis and no significant acute pathology was found.   Patient was given 5 1 cc LR, IV Protonix 40 mg, and 1 unit PRBC transfusion.  EDP spoke with GI Dr. Burnette who recommended medical admission and will see in consultation.  The hospitalist service was consulted for admission.  Assessment & Plan:   Principal Problem:   Symptomatic anemia Active Problems:   Alzheimer disease (HCC)   Hyperlipidemia   Hypertension   Chronic hyponatremia  Symptomatic microcytic anemia/B12 deficiency: Suspect secondary to upper GI bleed and possible B12 deficiency (B12 level<150).  FOBT is positive.  Hemoglobin 6.9 on admission compared to 9.9 this past June .  Status post 1 unit of PRBC transfusion.  Hemoglobin 8.2.  Continue Protonix twice daily.  Seen by Dr. Kristie, plan for EGD and colonoscopy in the morning.  Repeating CBC now.  Continue B12 supplement.     Hypertension: Blood pressure elevated.  Continue amlodipine , resume irbesartan .   Hyperlipidemia: Continue  pravastatin .   Chronic mild hyponatremia: Asymptomatic.  Continue monitor.   Urinary frequency/urgency: Recently treated for Enterococcus faecalis UTI.  UA with large leukocytes, 11-20 WBCs, rare bacteria.  Urine culture in process.   Alzheimer's dementia: Continue fall and delirium precautions.  Mild renal insufficiency: Patient's creatinine at baseline is around 0.7-0.8, presented with creatinine of 1.09.  Did not really meet criteria for AKI.  Renal function back to baseline.  DVT prophylaxis: SCDs Start: 06/28/24 1944   Code Status: Full Code  Family Communication:  None present at bedside.  Plan of care discussed with patient  Status is: Observation The patient will require care spanning > 2 midnights and should be moved to inpatient because: Needs evaluation by GI, may need EGD.   Estimated body mass index is 25.31 kg/m as calculated from the following:   Height as of this encounter: 5' 10 (1.778 m).   Weight as of this encounter: 80 kg.    Nutritional Assessment: Body mass index is 25.31 kg/m.[name] Seen by dietician.  I agree with the assessment and plan as outlined below: Nutrition Status:        . Skin Assessment: I have examined the patient's skin and I agree with the wound assessment as performed by the wound care RN as outlined below:    Consultants:  None  Procedures:  None  Antimicrobials:  Anti-infectives (From admission, onward)    None         Subjective: Seen and examined.  She has no complaints.  Objective: Vitals:   06/29/24 0803 06/29/24 1548 06/29/24 2043 06/30/24 0540  BP: (!) 182/63 ROLLEN)  172/79 (!) 159/68 (!) 157/71  Pulse: 77 76 72 63  Resp: 18 17 (!) 21 (!) 24  Temp: 98.8 F (37.1 C) 98.5 F (36.9 C) 98.8 F (37.1 C) 97.7 F (36.5 C)  TempSrc:  Oral Oral Oral  SpO2: 98% 100% 99% 100%  Weight:      Height:        Intake/Output Summary (Last 24 hours) at 06/30/2024 0814 Last data filed at 06/29/2024 2252 Gross per 24  hour  Intake 510 ml  Output 1250 ml  Net -740 ml   Filed Weights   06/28/24 2343  Weight: 80 kg    Examination:  General exam: Appears calm and comfortable  Respiratory system: Clear to auscultation. Respiratory effort normal. Cardiovascular system: S1 & S2 heard, RRR. No JVD, murmurs, rubs, gallops or clicks. No pedal edema. Gastrointestinal system: Abdomen is nondistended, soft and nontender. No organomegaly or masses felt. Normal bowel sounds heard. Central nervous system: Alert and oriented. No focal neurological deficits. Extremities: Symmetric 5 x 5 power. Skin: No rashes, lesions or ulcers.   Data Reviewed: I have personally reviewed following labs and imaging studies  CBC: Recent Labs  Lab 06/28/24 1352 06/29/24 0524  WBC 7.6 7.2  NEUTROABS 5.5  --   HGB 6.9* 8.2*  HCT 24.0* 27.8*  MCV 73.4* 73.2*  PLT 471* 431*   Basic Metabolic Panel: Recent Labs  Lab 06/28/24 1352 06/29/24 0524  NA 131* 132*  K 4.4 4.2  CL 97* 100  CO2 23 23  GLUCOSE 108* 111*  BUN 23 13  CREATININE 1.09* 0.75  CALCIUM 9.6 9.1  MG 2.5*  --    GFR: Estimated Creatinine Clearance: 54.6 mL/min (by C-G formula based on SCr of 0.75 mg/dL). Liver Function Tests: Recent Labs  Lab 06/28/24 1352  AST 18  ALT 10  ALKPHOS 68  BILITOT 0.6  PROT 6.7  ALBUMIN 4.1   No results for input(s): LIPASE, AMYLASE in the last 168 hours. No results for input(s): AMMONIA in the last 168 hours. Coagulation Profile: Recent Labs  Lab 06/28/24 1352  INR 1.0   Cardiac Enzymes: No results for input(s): CKTOTAL, CKMB, CKMBINDEX, TROPONINI in the last 168 hours. BNP (last 3 results) No results for input(s): PROBNP in the last 8760 hours. HbA1C: No results for input(s): HGBA1C in the last 72 hours. CBG: No results for input(s): GLUCAP in the last 168 hours. Lipid Profile: No results for input(s): CHOL, HDL, LDLCALC, TRIG, CHOLHDL, LDLDIRECT in the last 72  hours. Thyroid  Function Tests: No results for input(s): TSH, T4TOTAL, FREET4, T3FREE, THYROIDAB in the last 72 hours. Anemia Panel: Recent Labs    06/28/24 2128  VITAMINB12 <150*  FOLATE 10.4  FERRITIN 21  TIBC 395  IRON 57   Sepsis Labs: No results for input(s): PROCALCITON, LATICACIDVEN in the last 168 hours.  Recent Results (from the past 240 hours)  Urine Culture     Status: None   Collection Time: 06/28/24  5:59 PM   Specimen: Urine, Clean Catch  Result Value Ref Range Status   Specimen Description   Final    URINE, CLEAN CATCH Performed at [facility], [address] [place], [place] [address]    Special Requests   Final    NONE Performed at [facility], [address] [place], [place] [address]    Culture   Final    NO GROWTH Performed at [facility], [address], [place], [place]  [address]    Report Status 06/29/2024 FINAL  Final     Radiology Studies: CT CHEST ABDOMEN PELVIS WO CONTRAST Result Date: 06/28/2024 CLINICAL DATA:  Polytrauma, blunt. Recently treated UTI. Low back pain. History of dementia. EXAM: CT CHEST, ABDOMEN AND PELVIS WITHOUT CONTRAST TECHNIQUE: Multidetector CT imaging of the chest, abdomen and pelvis was performed following the standard protocol without IV contrast. RADIATION DOSE REDUCTION: This exam was performed according to the departmental dose-optimization program which includes automated exposure control, adjustment of the mA and/or kV according to patient size and/or use of iterative reconstruction technique. COMPARISON:  None Available. FINDINGS: CT CHEST FINDINGS Cardiovascular: The heart is mildly enlarged and there is a trace pericardial effusion. Scattered coronary artery calcifications are noted. There is atherosclerotic calcification of the aorta without evidence of aneurysm. The pulmonary trunk is normal in caliber. Mediastinum/Nodes: No mediastinal or axillary  lymphadenopathy. Evaluation of the hila is limited due to lack of IV contrast. The trachea and esophagus are within normal limits. There is a small hiatal hernia. Lungs/Pleura: Atelectasis/scarring is present bilaterally. No effusion or pneumothorax is seen. Musculoskeletal: A fat containing Bochdalek hernia is noted on the left. Degenerative changes are present in the thoracic spine. Destructive and degenerative changes are present at the glenohumeral joints bilaterally with a chronic subluxation. A joint effusion is present on the left. There is kyphosis of the thoracic spine. No acute fracture is seen. CT ABDOMEN PELVIS FINDINGS Hepatobiliary: No focal liver abnormality is seen. No gallstones, gallbladder wall thickening, or biliary dilatation. Pancreas: Unremarkable. No pancreatic ductal dilatation or surrounding inflammatory changes. Spleen: Normal in size without focal abnormality. Adrenals/Urinary Tract: The adrenal glands are within normal limits. No renal calculus or hydronephrosis bilaterally. The bladder is unremarkable. Stomach/Bowel: There is a small hiatal hernia. No bowel obstruction, free air, or pneumatosis is seen. A moderate amount of retained stool is noted in the colon. Appendix is not seen. Vascular/Lymphatic: Aortic atherosclerosis. No abdominal or pelvic lymphadenopathy. Reproductive: Status post hysterectomy. No adnexal masses. Other: No abdominopelvic ascites. A fat containing umbilical hernia is present. Musculoskeletal: Degenerative changes are noted in the lumbar spine and bilateral hips. There is ankylosis of the the T11 through L3 vertebral bodies. Spinal fusion hardware is noted at L4-L5. No acute fracture is seen. IMPRESSION: 1. No renal calculus or obstructive uropathy bilaterally. 2. Multilevel degenerative changes in the thoracolumbar spine without evidence of acute fracture. 3. Moderate amount of retained stool in the colon suggesting constipation. 4. Small hiatal hernia. 5.  Aortic atherosclerosis and coronary artery calcifications. Electronically Signed   By: Leita Birmingham M.D.   On: 06/28/2024 16:02   CT T-SPINE NO CHARGE Result Date: 06/28/2024 EXAM: CT THORACIC SPINE WITHOUT CONTRAST 06/28/2024 03:30:01 PM TECHNIQUE: CT of the thoracic spine was performed without the administration of intravenous contrast. Multiplanar reformatted images are provided for review. Automated exposure control, iterative reconstruction, and/or weight based adjustment of the mA/kV was utilized to reduce the radiation dose to as low as reasonably achievable. COMPARISON: None available. CLINICAL HISTORY: Recently treated UTI with symptom improvement, now symptoms have returned of lower back pain, polyuria but only drops come out. Hx dementia, son at bedside. FINDINGS: BONES AND ALIGNMENT: Normal vertebral body heights. Remote superior endplate fracture is present at T8. No acute fracture or suspicious bone lesion. Exaggerated cervical thoracic kyphosis was present. Rightward curvature centered at T11. DEGENERATIVE CHANGES: Ankylosis is present across the disc spaces at T11-T12 and T12-L1. SOFT TISSUES: No acute abnormality. IMPRESSION: 1. No acute findings.  2. Exaggerated cervicothoracic kyphosis. 3. Rightward curvature centered at T11. 4. Ankylosis across the disc spaces at T11-12 and T12-L1. 5. Remote superior endplate fracture at T8. Electronically signed by: Lonni Necessary MD 06/28/2024 03:48 PM EDT RP Workstation: [id number]   CT L-SPINE NO CHARGE Result Date: 06/28/2024 EXAM: CT OF THE LUMBAR SPINE WITHOUT CONTRAST 06/28/2024 03:30:01 PM TECHNIQUE: CT of the lumbar spine was performed without the administration of intravenous contrast. Multiplanar reformatted images are provided for review. Automated exposure control, iterative reconstruction, and/or weight based adjustment of the mA/kV was utilized to reduce the radiation dose to as low as reasonably achievable. COMPARISON: MRI of the  lumbar spine 07/28/2024. CLINICAL HISTORY: Recently treated UTI with symptom improvement, now symptoms have returned of lower back pain, polyuria but only drops come out. Hx dementia, son at bedside. FINDINGS: BONES AND ALIGNMENT: Normal vertebral body heights. Solid fusion is present at L4-L5. An ankylosis of lower thoracic spine extends to L3. Chronic sclerotic changes are present at L3-L4. No acute fracture or suspicious bone lesion. Normal alignment. DEGENERATIVE CHANGES: Moderate central and right greater than left foraminal stenosis is present at L3-L4. Moderate left foraminal stenosis is present at L5-S1. SOFT TISSUES: Atherosclerotic changes are present in the aorta and branch vessels without aneurysm. A small hiatal hernia is present. No acute abnormality. IMPRESSION: 1. No acute findings. 2. Solid fusion at L4-5 and ankylosis of lower thoracic spine extending to L3. 3. Chronic sclerotic changes at L3-4 with moderate central and right greater than left foraminal stenosis. 4. Moderate left foraminal stenosis at L5-S1. Electronically signed by: Lonni Necessary MD 06/28/2024 03:46 PM EDT RP Workstation: [id number]   CT CERVICAL SPINE WO CONTRAST Result Date: 06/28/2024 EXAM: CT CERVICAL SPINE WITHOUT CONTRAST 06/28/2024 03:30:01 PM TECHNIQUE: CT of the cervical spine was performed without the administration of intravenous contrast. Multiplanar reformatted images are provided for review. Automated exposure control, iterative reconstruction, and/or weight based adjustment of the mA/kV was utilized to reduce the radiation dose to as low as reasonably achievable. COMPARISON: CT of the cervical spine 08/10/2008. CLINICAL HISTORY: Polytrauma, blunt. Recently treated UTI with symptom improvement, now symptoms have returned of lower back pain, polyuria but only drops come out. Hx dementia, son at bedside. FINDINGS: CERVICAL SPINE: BONES AND ALIGNMENT: Straightening of the normal cervical lordosis is again  noted. Ankylosis is present across the disc spaces C2-C6. No acute fracture or traumatic malalignment. DEGENERATIVE CHANGES: Ankylosis is present across the disc spaces C2-C6. SOFT TISSUES: No prevertebral soft tissue swelling. IMPRESSION: 1. No acute abnormality of the cervical spine. 2. Ankylosis across the C2C6 disc spaces. Electronically signed by: Lonni Necessary MD 06/28/2024 03:42 PM EDT RP Workstation: [id number]   CT HEAD WO CONTRAST Result Date: 06/28/2024 EXAM: CT HEAD WITHOUT CONTRAST 06/28/2024 03:30:01 PM TECHNIQUE: CT of the head was performed without the administration of intravenous contrast. Automated exposure control, iterative reconstruction, and/or weight based adjustment of the mA/kV was utilized to reduce the radiation dose to as low as reasonably achievable. COMPARISON: CT head without contrast 08/31/2020. CLINICAL HISTORY: Head trauma, moderate-severe. Recently treated UTI with symptom improvement, now symptoms have returned of lower back pain, polyuria but only drops come out. Hx dementia, son at bedside. FINDINGS: BRAIN AND VENTRICLES: No acute hemorrhage. No evidence of acute infarct. No hydrocephalus. No extra-axial collection. No mass effect or midline shift. Moderate generalized atrophy and white matter changes are similar to the prior study. Minimal atherosclerotic changes are present within the cavernous internal carotid arteries bilaterally. No hyperdense vessel  is present. ORBITS: Bilateral lens replacements are noted. The globes and orbits are otherwise within normal limits. SINUSES: No acute abnormality. SOFT TISSUES AND SKULL: No acute soft tissue abnormality. No skull fracture. IMPRESSION: 1. No acute intracranial abnormality. 2. Moderate generalized atrophy and white matter changes, similar to the prior study. Electronically signed by: Lonni Necessary MD 06/28/2024 03:40 PM EDT RP Workstation: [id number]    Scheduled Meds:  amLODipine   10 mg Oral Daily    cyanocobalamin  1,000 mcg Intramuscular Q48H   irbesartan   300 mg Oral Daily   pantoprazole (PROTONIX) IV  40 mg Intravenous Q12H   polyethylene glycol  17 g Oral Daily   pravastatin   40 mg Oral Daily   Continuous Infusions:   LOS: 0 days   Fredia Skeeter, MD [facility]  06/30/2024, 8:14 AM   *Please note that this is a verbal dictation therefore any spelling or grammatical errors are due to the Dragon Medical One system interpretation.  Please page via Amion and do not message via secure chat for urgent patient care matters. Secure chat can be used for non urgent patient care matters.  How to contact the [facility] Attending or Consulting provider 7A - 7P or covering provider during after hours 7P -7A, for this patient?  Check the care team in [facility] and look for a) attending/consulting [facility] provider listed and b) the [facility] team listed. Page or secure chat 7A-7P. Log into [email or web address] and use [facility] universal password to access. If you do not have the password, please contact the hospital operator. Locate the [facility] provider you are looking for under [facility] and page to a number that you can be directly reached. If you still have difficulty reaching the provider, please page the [facility] (Director on Call) for the Hospitalists listed on amion for assistance.

## 2024-07-01 ENCOUNTER — Observation Stay (HOSPITAL_COMMUNITY): Admitting: Anesthesiology

## 2024-07-01 ENCOUNTER — Encounter (HOSPITAL_COMMUNITY)
Admission: EM | Disposition: A | Discharge status: Skilled Nursing Facility | Payer: Self-pay | Source: Home / Self Care | Attending: Internal Medicine

## 2024-07-01 DIAGNOSIS — R195 Other fecal abnormalities: Secondary | ICD-10-CM | POA: Diagnosis not present

## 2024-07-01 DIAGNOSIS — Z1211 Encounter for screening for malignant neoplasm of colon: Secondary | ICD-10-CM | POA: Diagnosis not present

## 2024-07-01 DIAGNOSIS — G308 Other Alzheimer's disease: Secondary | ICD-10-CM | POA: Diagnosis not present

## 2024-07-01 DIAGNOSIS — I10 Essential (primary) hypertension: Secondary | ICD-10-CM | POA: Diagnosis not present

## 2024-07-01 DIAGNOSIS — K449 Diaphragmatic hernia without obstruction or gangrene: Secondary | ICD-10-CM

## 2024-07-01 DIAGNOSIS — Z87891 Personal history of nicotine dependence: Secondary | ICD-10-CM | POA: Diagnosis not present

## 2024-07-01 DIAGNOSIS — K635 Polyp of colon: Secondary | ICD-10-CM

## 2024-07-01 DIAGNOSIS — D649 Anemia, unspecified: Secondary | ICD-10-CM | POA: Diagnosis not present

## 2024-07-01 DIAGNOSIS — D490 Neoplasm of unspecified behavior of digestive system: Secondary | ICD-10-CM | POA: Diagnosis not present

## 2024-07-01 DIAGNOSIS — D509 Iron deficiency anemia, unspecified: Secondary | ICD-10-CM | POA: Diagnosis not present

## 2024-07-01 HISTORY — PX: COLONOSCOPY: SHX5424

## 2024-07-01 HISTORY — PX: ESOPHAGOGASTRODUODENOSCOPY: SHX5428

## 2024-07-01 LAB — CBC WITH DIFFERENTIAL/PLATELET
Abs Immature Granulocytes: 0.03 K/uL (ref 0.00–0.07)
Basophils Absolute: 0 K/uL (ref 0.0–0.1)
Basophils Relative: 0 %
Eosinophils Absolute: 0.1 K/uL (ref 0.0–0.5)
Eosinophils Relative: 1 %
HCT: 31.1 % — ABNORMAL LOW (ref 36.0–46.0)
Hemoglobin: 8.7 g/dL — ABNORMAL LOW (ref 12.0–15.0)
Immature Granulocytes: 0 %
Lymphocytes Relative: 22 %
Lymphs Abs: 1.5 K/uL (ref 0.7–4.0)
MCH: 21 pg — ABNORMAL LOW (ref 26.0–34.0)
MCHC: 28 g/dL — ABNORMAL LOW (ref 30.0–36.0)
MCV: 74.9 fL — ABNORMAL LOW (ref 80.0–100.0)
Monocytes Absolute: 0.8 K/uL (ref 0.1–1.0)
Monocytes Relative: 11 %
Neutro Abs: 4.4 K/uL (ref 1.7–7.7)
Neutrophils Relative %: 66 %
Platelets: 413 K/uL — ABNORMAL HIGH (ref 150–400)
RBC: 4.15 MIL/uL (ref 3.87–5.11)
RDW: 18.2 % — ABNORMAL HIGH (ref 11.5–15.5)
WBC: 6.8 K/uL (ref 4.0–10.5)
nRBC: 0 % (ref 0.0–0.2)

## 2024-07-01 SURGERY — EGD (ESOPHAGOGASTRODUODENOSCOPY)
Anesthesia: Monitor Anesthesia Care | Laterality: Left

## 2024-07-01 MED ORDER — PEG 3350-KCL-NA BICARB-NACL 420 G PO SOLR
4000.0000 mL | Freq: Once | ORAL | Status: AC
  Administered 2024-07-01: 4000 mL via ORAL

## 2024-07-01 MED ORDER — PHENYLEPHRINE HCL-NACL 20-0.9 MG/250ML-% IV SOLN
INTRAVENOUS | Status: DC | PRN
  Administered 2024-07-01: 30 ug/min via INTRAVENOUS

## 2024-07-01 MED ORDER — LIDOCAINE HCL (PF) 2 % IJ SOLN
INTRAMUSCULAR | Status: DC | PRN
Start: 2024-07-01 — End: 2024-07-01
  Administered 2024-07-01: 40 mg via INTRADERMAL
  Administered 2024-07-01: 60 mg via INTRADERMAL

## 2024-07-01 MED ORDER — PROPOFOL 10 MG/ML IV BOLUS
INTRAVENOUS | Status: DC | PRN
  Administered 2024-07-01: 40 mg via INTRAVENOUS
  Administered 2024-07-01: 80 mg via INTRAVENOUS

## 2024-07-01 MED ORDER — PHENYLEPHRINE HCL (PRESSORS) 10 MG/ML IV SOLN
INTRAVENOUS | Status: AC
  Filled 2024-07-01: qty 1

## 2024-07-01 MED ORDER — PROPOFOL 500 MG/50ML IV EMUL
INTRAVENOUS | Status: DC | PRN
  Administered 2024-07-01: 100 ug/kg/min via INTRAVENOUS

## 2024-07-01 NOTE — Anesthesia Preprocedure Evaluation (Addendum)
 Anesthesia Evaluation  Patient identified by MRN, date of birth, ID band Patient awake    Reviewed: Allergy & Precautions, NPO status , Patient's Chart, lab work & pertinent test results  History of Anesthesia Complications Negative for: history of anesthetic complications  Airway Mallampati: III  TM Distance: >3 FB Neck ROM: Full    Dental  (+) Dental Advisory Given   Pulmonary neg shortness of breath, asthma , neg sleep apnea, neg COPD, neg recent URI, former smoker, PE (2004)   Pulmonary exam normal breath sounds clear to auscultation       Cardiovascular hypertension, (-) angina (-) Past MI, (-) Cardiac Stents and (-) CABG (-) dysrhythmias + Valvular Problems/Murmurs (mild MR)  Rhythm:Regular Rate:Normal  HLD  TTE 05/31/2016: Study Conclusions   - Left ventricle: The cavity size was normal. Wall thickness was    normal. Systolic function was normal. The estimated ejection    fraction was in the range of 55% to 60%. Wall motion was normal;    there were no regional wall motion abnormalities.  - Mitral valve: Mild, late systolicprolapse, involving the anterior    leaflet. There was mild regurgitation directed eccentrically and    posteriorly.  - Left atrium: The atrium was mildly dilated.     Neuro/Psych neg Seizures PSYCHIATRIC DISORDERS (Alzheimer's)     Dementia  Neuromuscular disease (chronic bilateral low back pain)    GI/Hepatic Neg liver ROS, Bowel prep,,,  Endo/Other  negative endocrine ROS    Renal/GU negative Renal ROS     Musculoskeletal   Abdominal   Peds  Hematology  (+) Blood dyscrasia, anemia Lab Results      Component                Value               Date                      WBC                      6.8                 07/01/2024                HGB                      8.7 (L)             07/01/2024                HCT                      31.1 (L)            07/01/2024                MCV                       74.9 (L)            07/01/2024                PLT                      413 (H)             07/01/2024              Anesthesia Other  Findings   Reproductive/Obstetrics                              Anesthesia Physical Anesthesia Plan  ASA: 3  Anesthesia Plan: MAC   Post-op Pain Management: Minimal or no pain anticipated   Induction: Intravenous  PONV Risk Score and Plan: 2 and Propofol infusion, TIVA and Treatment may vary due to age or medical condition  Airway Management Planned: Simple Face Mask and Natural Airway  Additional Equipment:   Intra-op Plan:   Post-operative Plan:   Informed Consent: I have reviewed the patients History and Physical, chart, labs and discussed the procedure including the risks, benefits and alternatives for the proposed anesthesia with the patient or authorized representative who has indicated his/her understanding and acceptance.     Dental advisory given  Plan Discussed with: CRNA and Anesthesiologist  Anesthesia Plan Comments: (Anesthesia plan discussed with the patient in person and via phone with the patient's son, Joleena Weisenburger. Discussed with patient risks of MAC including, but not limited to, minor pain or discomfort, hearing people in the room, and possible need for backup general anesthesia. Risks for general anesthesia also discussed including, but not limited to, sore throat, hoarse voice, chipped/damaged teeth, injury to vocal cords, nausea and vomiting, allergic reactions, lung infection, heart attack, stroke, and death. All questions answered. )         Anesthesia Quick Evaluation

## 2024-07-01 NOTE — Transfer of Care (Signed)
 Immediate Anesthesia Transfer of Care Note  Patient: Amy Andrade  Procedure(s) Performed: EGD (ESOPHAGOGASTRODUODENOSCOPY) (Left) COLONOSCOPY (Left)  Patient Location: PACU  Anesthesia Type:MAC  Level of Consciousness: drowsy  Airway & Oxygen Therapy: Patient Spontanous Breathing and Patient connected to nasal cannula oxygen  Post-op Assessment: Report given to RN and Post -op Vital signs reviewed and stable  Post vital signs: Reviewed and stable  Last Vitals:  Vitals Value Taken Time  BP    Temp    Pulse    Resp    SpO2      Last Pain:  Vitals:   07/01/24 1322  TempSrc: Temporal  PainSc:          Complications: No notable events documented.

## 2024-07-01 NOTE — Progress Notes (Signed)
 PROGRESS NOTE    Amy Andrade  FMW:[id number] DOB: 22-May-1936 DOA: 06/28/2024 PCP: [name], NP   Brief Narrative:  Amy Andrade is a 88 y.o. female with medical history significant for Alzheimer's dementia, HTN, HLD, chronic pain due to OA of both shoulders and chronic left shoulder dislocation who presented to the ED for evaluation of generalized weakness and no other complaint. She notes frequent urinary urgency.  She was recently treated for UTI initially with ciprofloxacin .  Urine culture grew Enterococcus faecalis and antibiotics were switched to nitrofurantoin  which she has since completed.  She has had suprapubic discomfort.  Upon arrival to ED, hemodynamically stable. Labs showed hemoglobin 6.9,LFTs within normal limits, troponin T 27 > 23.  FOBT positive.  Dark stool on DRE per EDP.  UA unremarkable.  Multiple CT scans were done including CT head, cervical spine, thoracic spine, lumbar spine, chest abdomen and pelvis and no significant acute pathology was found.   Patient was given 5 1 cc LR, IV Protonix 40 mg, and 1 unit PRBC transfusion.  EDP spoke with GI Dr. Burnette who recommended medical admission and will see in consultation.  The hospitalist service was consulted for admission.  Assessment & Plan:   Principal Problem:   Symptomatic anemia Active Problems:   Alzheimer disease (HCC)   Hyperlipidemia   Hypertension   Chronic hyponatremia  Symptomatic microcytic anemia/B12 deficiency: Suspect secondary to upper GI bleed and possible B12 deficiency (B12 level<150).  FOBT is positive.  Hemoglobin 6.9 on admission compared to 9.9 this past June .  Status post 1 unit of PRBC transfusion.  Hemoglobin 8.2.  Continue Protonix twice daily.  Seen by Dr. Kristie, plan for EGD and colonoscopy today with Dr. Rollin.  Hemoglobin 8.7.  Continue B12 supplement.     Hypertension: Blood pressure elevated.  Continue amlodipine , resume irbesartan .   Hyperlipidemia: Continue  pravastatin .   Chronic mild hyponatremia: Asymptomatic.  Continue monitor.   Urinary frequency/urgency: Recently treated for Enterococcus faecalis UTI.  UA with large leukocytes, 11-20 WBCs, rare bacteria.  Urine culture in process.   Alzheimer's dementia: Continue fall and delirium precautions.  Mild renal insufficiency: Patient's creatinine at baseline is around 0.7-0.8, presented with creatinine of 1.09.  Did not really meet criteria for AKI.  Renal function back to baseline.  DVT prophylaxis: SCDs Start: 06/28/24 1944   Code Status: Full Code  Family Communication:  None present at bedside.  Plan of care discussed with patient  Status is: Observation The patient will require care spanning > 2 midnights and should be moved to inpatient because: Scheduled for EGD and colonoscopy today.   Estimated body mass index is 25.31 kg/m as calculated from the following:   Height as of this encounter: 5' 10 (1.778 m).   Weight as of this encounter: 80 kg.    Nutritional Assessment: Body mass index is 25.31 kg/m.Amy Andrade Seen by dietician.  I agree with the assessment and plan as outlined below: Nutrition Status:        . Skin Assessment: I have examined the patient's skin and I agree with the wound assessment as performed by the wound care RN as outlined below:    Consultants:  None  Procedures:  None  Antimicrobials:  Anti-infectives (From admission, onward)    None         Subjective: Seen and examined, she has no complaints.  Objective: Vitals:   06/30/24 0855 06/30/24 1342 06/30/24 1901 07/01/24 0432  BP: 132/60 131/76 (!) 150/64 ROLLEN)  160/71  Pulse: 64 67 69 69  Resp: 16 17 18 19   Temp:  97.8 F (36.6 C) 98.3 F (36.8 C) 98.3 F (36.8 C)  TempSrc:      SpO2: 100% 100% 100% 100%  Weight:      Height:        Intake/Output Summary (Last 24 hours) at 07/01/2024 1054 Last data filed at 07/01/2024 0700 Gross per 24 hour  Intake 411.54 ml  Output 1100 ml   Net -688.46 ml   Filed Weights   06/28/24 2343  Weight: 80 kg    Examination:  General exam: Appears calm and comfortable  Respiratory system: Clear to auscultation. Respiratory effort normal. Cardiovascular system: S1 & S2 heard, RRR. No JVD, murmurs, rubs, gallops or clicks. No pedal edema. Gastrointestinal system: Abdomen is nondistended, soft and nontender. No organomegaly or masses felt. Normal bowel sounds heard. Central nervous system: Alert and oriented. No focal neurological deficits. Extremities: Symmetric 5 x 5 power. Skin: No rashes, lesions or ulcers.   Data Reviewed: I have personally reviewed following labs and imaging studies  CBC: Recent Labs  Lab 06/28/24 1352 06/29/24 0524 06/30/24 0844 07/01/24 0621  WBC 7.6 7.2 6.9 6.8  NEUTROABS 5.5  --  4.7 4.4  HGB 6.9* 8.2* 9.4* 8.7*  HCT 24.0* 27.8* 32.5* 31.1*  MCV 73.4* 73.2* 73.4* 74.9*  PLT 471* 431* 462* 413*   Basic Metabolic Panel: Recent Labs  Lab 06/28/24 1352 06/29/24 0524  NA 131* 132*  K 4.4 4.2  CL 97* 100  CO2 23 23  GLUCOSE 108* 111*  BUN 23 13  CREATININE 1.09* 0.75  CALCIUM 9.6 9.1  MG 2.5*  --    GFR: Estimated Creatinine Clearance: 54.6 mL/min (by C-G formula based on SCr of 0.75 mg/dL). Liver Function Tests: Recent Labs  Lab 06/28/24 1352  AST 18  ALT 10  ALKPHOS 68  BILITOT 0.6  PROT 6.7  ALBUMIN 4.1   No results for input(s): LIPASE, AMYLASE in the last 168 hours. No results for input(s): AMMONIA in the last 168 hours. Coagulation Profile: Recent Labs  Lab 06/28/24 1352  INR 1.0   Cardiac Enzymes: No results for input(s): CKTOTAL, CKMB, CKMBINDEX, TROPONINI in the last 168 hours. BNP (last 3 results) No results for input(s): PROBNP in the last 8760 hours. HbA1C: No results for input(s): HGBA1C in the last 72 hours. CBG: No results for input(s): GLUCAP in the last 168 hours. Lipid Profile: No results for input(s): CHOL, HDL,  LDLCALC, TRIG, CHOLHDL, LDLDIRECT in the last 72 hours. Thyroid  Function Tests: No results for input(s): TSH, T4TOTAL, FREET4, T3FREE, THYROIDAB in the last 72 hours. Anemia Panel: Recent Labs    06/28/24 2128  VITAMINB12 <150*  FOLATE 10.4  FERRITIN 21  TIBC 395  IRON 57   Sepsis Labs: No results for input(s): PROCALCITON, LATICACIDVEN in the last 168 hours.  Recent Results (from the past 240 hours)  Urine Culture     Status: None   Collection Time: 06/28/24  5:59 PM   Specimen: Urine, Clean Catch  Result Value Ref Range Status   Specimen Description   Final    URINE, CLEAN CATCH Performed at [facility], [address], [place], [place] [address]    Special Requests   Final    NONE Performed at [facility], [address], [place], [place] [address]    Culture   Final    NO GROWTH Performed at [facility], [address], [place], [place] [address]    Report Status 06/29/2024 FINAL  Final     Radiology Studies: No results found.   Scheduled Meds:  amLODipine   10 mg Oral Daily   cyanocobalamin  1,000 mcg Intramuscular Q48H   irbesartan   300 mg Oral Daily   pantoprazole (PROTONIX) IV  40 mg Intravenous Q12H   polyethylene glycol  17 g Oral Daily   pravastatin   40 mg Oral Daily   Continuous Infusions:  sodium chloride  20 mL/hr at 07/01/24 0648     LOS: 0 days   Fredia Skeeter, MD [facility]  07/01/2024, 10:54 AM   *Please note that this is a verbal dictation therefore any spelling or grammatical errors are due to the Dragon Medical One system interpretation.  Please page via Amion and do not message via secure chat for urgent patient care matters. Secure chat can be used for non urgent patient care matters.  How to contact the [facility] Attending or Consulting provider 7A - 7P or covering provider during after hours 7P -7A, for this patient?  Check the care team in [facility] and look for a)  attending/consulting [facility] provider listed and b) the [facility] team listed. Page or secure chat 7A-7P. Log into [email or web address] and use [facility]'s universal password to access. If you do not have the password, please contact the hospital operator. Locate the [facility] provider you are looking for under [facility] and page to a number that you can be directly reached. If you still have difficulty reaching the provider, please page the [facility] (Director on Call) for the Hospitalists listed on amion for assistance.

## 2024-07-01 NOTE — Anesthesia Postprocedure Evaluation (Signed)
 Anesthesia Post Note  Patient: Amy Andrade  Procedure(s) Performed: EGD (ESOPHAGOGASTRODUODENOSCOPY) (Left) COLONOSCOPY (Left)     Patient location during evaluation: PACU Anesthesia Type: MAC Level of consciousness: awake Pain management: pain level controlled Vital Signs Assessment: post-procedure vital signs reviewed and stable Respiratory status: spontaneous breathing, nonlabored ventilation and respiratory function stable Cardiovascular status: stable and blood pressure returned to baseline Postop Assessment: no apparent nausea or vomiting Anesthetic complications: no   No notable events documented.  Last Vitals:  Vitals:   07/01/24 1450 07/01/24 1500  BP: (!) 105/43 (!) 148/53  Pulse: 71 65  Resp: 17 20  Temp:    SpO2: 98% 98%    Last Pain:  Vitals:   07/01/24 1443  TempSrc: Temporal  PainSc:                  Delon Aisha Arch

## 2024-07-01 NOTE — Op Note (Signed)
 [facility] Patient Name: Amy Andrade Procedure Date: 07/01/2024 MRN: [id number] Attending MD: Renaye Sous , MD, [id number] Date of Birth: [birth year] CSN: [id number] Age: 88 Admit Type: Inpatient Procedure:                Diagnostic EGD. Indications:              Iron deficiency anemia, Heme positive stool Providers:                Renaye Sous, MD, Ozell Pouch, RN, Felice Sar,                            Technician, Cena Currier, CRNA, Delon Arch, MD Referring MD:             Roxan Plough, NP Medicines:                Monitored Anesthesia Care Complications:            No immediate complications. Estimated Blood Loss:     Estimated blood loss: none. Procedure:                Pre-Anesthesia Assessment: - Prior to the                            procedure, a history and physical was performed,                            and patient medications and allergies were                            reviewed. The patient's tolerance of previous                            anesthesia was also reviewed. The risks and                            benefits of the procedure and the sedation options                            and risks were discussed with the patient. All                            questions were answered, and informed consent was                            obtained. Prior Anticoagulants: The patient has                            taken no anticoagulant or antiplatelet agents                            except for aspirin . ASA Grade Assessment: III - A                            patient with severe systemic disease. After  reviewing the risks and benefits, the patient was                            deemed in satisfactory condition to undergo the                            procedure. After obtaining informed consent, the                            endoscope was passed under direct vision.                            Throughout the  procedure, the patient's blood                            pressure, pulse, and oxygen saturations were                            monitored continuously. The [id number]-[id number] ([id number])                            Olympus endoscope was introduced through the mouth,                            and advanced to the second part of duodenum. The                            EGD was accomplished without difficulty. The                            patient tolerated the procedure well. Scope In: Scope Out: Findings:      The examined esophagus and GEJ appeared widely patent and normal.      A large 5 cm hiatal hernia was noted on retroflexion with ?couple of       Cameron lesions; the rest of the stomach appeared normal.      The examined duodenum was normal. Impression:               - Normal appearing, widely patent esophagus and GEJ.                           - Large 5 cm hiatal hernia with a couple of                            ?Cameron lesions.                           - Normal examined duodenum.                           - No specimens collected. Moderate Sedation:      MAC used. Recommendation:           - Full liquid diet today.                           -  Continue present medications.                           - Proceed with a colonoscopy. Procedure Code(s):        --- Professional ---                           [phone], Esophagogastroduodenoscopy, flexible,                            transoral; diagnostic, including collection of                            specimen(s) by brushing or washing, when performed                            (separate procedure) Diagnosis Code(s):        --- Professional ---                           D50.9, Iron deficiency anemia, unspecified                           R19.5, Other fecal abnormalities                           K44.9, Diaphragmatic hernia without obstruction or                            gangrene CPT copyright 2022 American Medical Association. All rights  reserved. The codes documented in this report are preliminary and upon coder review may  be revised to meet current compliance requirements. Renaye Sous, MD Renaye Sous, MD 07/01/2024 2:50:13 PM This report has been signed electronically. Number of Addenda: 0

## 2024-07-01 NOTE — Op Note (Addendum)
 [facility] Patient Name: Amy Andrade Procedure Date: 07/01/2024 MRN: [id number] Attending MD: Renaye Sous , MD, [id number] Date of Birth: February 22, 1936 CSN: [id number] Age: 88 Admit Type: Inpatient Procedure:                Colonoscopy with biopsies & snare polypectomy x 1. Indications:              Iron deficiency anemia, Heme postive stools, CRC                            screening. Providers:                Renaye Sous, MD, Ozell Pouch, RN, Felice Sar,                            Technician, Cena Currier, CRNA, Delon Arch, MD Referring MD:             Roxan Plough, NP Medicines:                Monitored Anesthesia Care Complications:            No immediate complications. Estimated Blood Loss:     Estimated blood loss was minimal. Procedure:                Pre-Anesthesia Assessment: - Prior to the                            procedure, a history and physical was performed,                            and patient medications and allergies were                            reviewed. The patient's tolerance of previous                            anesthesia was also reviewed. The risks and                            benefits of the procedure and the sedation options                            and risks were discussed with the patient. All                            questions were answered, and informed consent was                            obtained. Prior Anticoagulants: The patient has                            taken no anticoagulant or antiplatelet agents                            except for Aspirin . ASA Grade Assessment: III - A  patient with severe systemic disease. After                            reviewing the risks and benefits, the patient was                            deemed in satisfactory condition to undergo the                            procedure. After obtaining informed consent, the                             colonoscope was passed under direct vision.                            Throughout the procedure, the patient's blood                            pressure, pulse, and oxygen saturations were                            monitored continuously. The [id number]-[id number] ([id number])                            Olympus colonscope was introduced through the anus                            and advanced to the the cecum, identified by                            appendiceal orifice and ileocecal valve. The                            colonoscopy was extremely difficult due to                            inadequate bowel prep. Completion of the procedure                            was aided by lavage. The patient tolerated the                            procedure well. The quality of the bowel                            preparation was fair. The ileocecal valve, the                            appendiceal orifice and the rectum were                            photographed. The bowel preparation used was  NuLytely  via extended prep with split dose                            instruction. Scope In: 2:07:22 PM Scope Out: 2:33:55 PM Scope Withdrawal Time: 0 hours 2 minutes 45 seconds  Total Procedure Duration: 0 hours 26 minutes 33 seconds  Findings:      The perianal exam revealed poor sphincter tone.      A 7 mm sessile polyp was found in the mid-transverse colon. The polyp       was removed with a cold snare x 1; resection and retrieval were complete.      A large, infiltrative non-obstructing mass was found in the proximal       ascending colon, distal to the ICV . The mass was partially       circumferential (involving one-half of the lumen circumference). The       mass measured five cm in length. No bleeding was present. This was       biopsied with a cold forceps for histology.      A moderate amount of stool was found in the entire colon, precluding       visualization. Lavage of the  area was performed using a large amount of       sterile water, resulting in clearance with fair visualization. Impression:               - Preparation of the colon was fair.                           - Poor sphincter tone.                           - One 7 mm sessile polyp in the mid-transverse                            colon, removed with a cold snare x 1; resected and                            retrieved.                           - Likely malignant, 5 cm infiltrative mass, tumor                            in the proximal ascending colon-biopsied.                           - Stool in the entire examined colon; aggressive                            lavage done; small lesions/polyps could be missed. Moderate Sedation:      MAC used. Recommendation:           - Full liquid diet today.                           - Continue present medications.                           -  Await pathology results.                           - Perform a CT scan (computed tomography) of chest                            with contrast, abdomen with contrast and pelvis                            with contrast at appointment to be scheduled.                           - Surgical consult. Procedure Code(s):        --- Professional ---                           [phone], Colonoscopy, flexible; with removal of                            tumor(s), polyp(s), or other lesion(s) by snare                            technique                           45380, 59, Colonoscopy, flexible; with biopsy,                            single or multiple Diagnosis Code(s):        --- Professional ---                           D50.9, Iron deficiency anemia, unspecified                           R19.5, Other fecal abnormalities                           D12.3, Benign neoplasm of transverse colon (hepatic                            flexure or splenic flexure)                           D49.0, Neoplasm of unspecified behavior of                             digestive system                           Z12.11, Encounter for screening for malignant                            neoplasm of colon CPT copyright 2022 American Medical Association. All rights reserved. The codes documented in this report are preliminary and upon coder review may  be revised to meet current compliance requirements. Renaye Sous, MD Renaye Sous, MD  07/01/2024 3:07:38 PM This report has been signed electronically. Number of Addenda: 0

## 2024-07-01 NOTE — TOC Progression Note (Addendum)
 Transition of Care [facility]) - Progression Note    Patient Details  Name: Amy Andrade MRN: [id number] Date of Birth: 1935/10/10  Transition of Care [facility]) CM/SW Contact  Maxton Noreen, Nathanel, RN Phone Number: 07/01/2024, 2:50 PM  Clinical Narrative:Spoke to [name](Son) about d/c plans-PT recc HHPT/OT;no preference for HHC;hospital bed recc-no preference for dme-[employer] rep Jermaine to deliver to home prior d/c-confirmed address. Already has shower chair.. Family will transport home.   -3:31p [facility] for HHPT/OT rep Amy accepted.     Expected Discharge Plan: Home w Home Health Services Barriers to Discharge: Continued Medical Work up               Expected Discharge Plan and Services   Discharge Planning Services: CM Consult Post Acute Care Choice: Home Health, Durable Medical Equipment                   DME Arranged: Hospital bed DME Agency: [employer] Date DME Agency Contacted: 07/01/24     HH Arranged: PT, OT           Social Drivers of Health (SDOH) Interventions SDOH Screenings   Food Insecurity: No Food Insecurity (06/28/2024)  Housing: Low Risk  (06/28/2024)  Transportation Needs: No Transportation Needs (06/28/2024)  Utilities: Not At Risk (06/28/2024)  Depression (PHQ2-9): Low Risk  (05/21/2024)  Financial Resource Strain: Low Risk  (11/18/2017)  Physical Activity: Inactive (11/18/2017)  Social Connections: Socially Isolated (06/28/2024)  Stress: No Stress Concern Present (11/18/2017)  Tobacco Use: Medium Risk (06/28/2024)    Readmission Risk Interventions     No data to display

## 2024-07-01 NOTE — Consult Note (Cosign Needed Addendum)
 Amy Andrade 1935/12/25  [id number].    Requesting MD: Dr. Fredia Skeeter Chief Complaint/Reason for Consult: Colon Mass  HPI:  Amy Andrade is a 88 y.o. female with a history of PE (2004) not on anticoagulation, HTN, HLD, asthma, Alzheimer disease who was recently treated for Enterococcus faecalis UTI who presented to the ED for weakness.   Her son is at bedside who provides most of the history. Some of the history was also taken from chart review. Per report, 3 days prior to presentation she developed increase fatigue, lower back pain urinary symptoms and had unwitnessed falls at home. Family reports dark stool at home and weight loss. She presented to the ED for evaluation and workup was notable for H/H 6.9/24 (baseline 9.9/35.6 on 03/07/24) and fecal occult positive. CT CAP showed moderate amount of retained stool in the colon without any acute findings in the A/P. She was admitted to [facility]. Patient was given 1U PRBC on 10/12 with good response to hgb 8.2. Hgb 8.7 this AM from 9.4 from yesterday. GI was consulted and performed EGD and Colonoscopy today. EGD notable for Large 5 cm hiatal hernia with a couple of ? Cameron lesions and Colonoscopy was notable for a large, infiltrative non- obstructing mass was found in the proximal ascending colon, distal to the ICV ; the mass was partially circumferential ( involving one- half of the lumen circumference); the mass measured five cm in length; no bleeding was present; this was biopsied with a cold forceps for histology. General surgery was asked to see. She currently complains of no abdominal pain, n/v. She is tolerating CLD and tolerated bowel prep yesterday. Per RN last BM was non-bloody.   Patient has a history of prior abdominal hysterectomy. She walks at baseline with a walker and lives with her son and daughter in [place]. She is not on any blood thinners. Denies tobacco use. Her brother has a history of bladder cancer. Otherwise denies personal  or family history of cancer. They do not know the reason she developed a PE in the past.    EGD 07/01/24 by Dr. Kristie  - Normal appearing, widely patent esophagus and GEJ. - Large 5 cm hiatal hernia with a couple of ? Cameron lesions.  - Normal examined duodenum.  - No specimens collected.   Colonoscopy 07/01/24 by Dr. Kristie - Preparation of the colon was fair.  - Poor sphincter tone. - One 7 mm sessile polyp in the mid- transverse colon, removed with a cold snare x 1; resected and retrieved.  - Likely malignant, 5 cm infiltrative mass, tumor in the proximal ascending colon- biopsied.  - Stool in the entire examined colon; aggressive lavage done; small lesions/ polyps could be missed.   ROS: ROS As above, see hpi  Family History  Problem Relation Age of Onset   Alzheimer's disease Mother    Chronic bronchitis Mother    Heart disease Father    High blood pressure Father    High blood pressure Brother    Asthma Daughter    High blood pressure Son    High Cholesterol Son    Post-traumatic stress disorder Son     Past Medical History:  Diagnosis Date   Alzheimer disease (HCC)    Asthma    Cancer (HCC)    Hyperlipidemia    Hypertension    Pulmonary embolism (HCC) 2004   was short of breath--was on coumadin for 6 mos to a year    Past  Surgical History:  Procedure Laterality Date   ABDOMINAL HYSTERECTOMY     BUNIONECTOMY     CATARACT EXTRACTION Bilateral 06/17/2022   REPLACEMENT TOTAL KNEE  1980s, 2003   x2    Social History:  reports that she quit smoking about 66 years ago. Her smoking use included cigarettes. She started smoking about 70 years ago. She has a 1 pack-year smoking history. She has never used smokeless tobacco. She reports current alcohol use of about 2.0 standard drinks of alcohol per week. She reports that she does not use drugs.  Allergies: No Known Allergies  Medications Prior to Admission  Medication Sig Dispense Refill   amLODipine  (NORVASC ) 10  MG tablet Take 1 tablet (10 mg total) by mouth daily. (Patient taking differently: Take 10 mg by mouth in the morning.) 90 tablet 1   aspirin  81 MG tablet Take 81 mg by mouth in the morning.     CALCIUM-VITAMIN D PO Take 1 tablet by mouth 2 (two) times daily.     HYDROcodone -acetaminophen  (NORCO/VICODIN) 5-325 MG tablet Take 1 tablet by mouth every 12 (twelve) hours as needed for severe pain (pain score 7-10). (Patient taking differently: Take 1 tablet by mouth every 12 (twelve) hours as needed for severe pain (pain score 7-10) (shoulder pain keeping her awake).) 30 tablet 0   irbesartan  (AVAPRO ) 300 MG tablet Take 1 tablet (300 mg total) by mouth daily. (Patient taking differently: Take 300 mg by mouth in the morning.) 90 tablet 1   methocarbamol  (ROBAXIN ) 500 MG tablet Take 1 tablet by mouth once daily (Patient taking differently: Take 500 mg by mouth in the morning.) 90 tablet 1   nitroGLYCERIN  (NITROSTAT ) 0.4 MG SL tablet Place 1 tablet (0.4 mg total) under the tongue every 5 (five) minutes x 3 doses as needed for chest pain. 25 tablet 0   nystatin  cream (MYCOSTATIN ) Apply 1 application  topically 2 (two) times daily. (Patient taking differently: Apply 1 application  topically as needed for dry skin.) 30 g 3   pravastatin  (PRAVACHOL ) 40 MG tablet Take 1 tablet (40 mg total) by mouth daily. (Patient taking differently: Take 40 mg by mouth in the morning.) 90 tablet 1   triamcinolone  cream (KENALOG ) 0.1 % Apply 1 Application topically 2 (two) times daily. Affected area on right foot (Patient taking differently: Apply 1 Application topically as needed. Affected area on right foot) 30 g 0     Physical Exam: Blood pressure (!) 165/72, pulse 66, temperature (!) 97.4 F (36.3 C), temperature source Oral, resp. rate 16, height 5' 10 (1.778 m), weight 80 kg, SpO2 99%. General: pleasant, WD/WN female who is laying in bed in NAD HEENT: head is normocephalic, atraumatic.  Sclera are non-icteric.  Heart:  regular, rate, and rhythm.   Lungs: Respiratory effort nonlabored Abd:  Soft, ND, suprapubic and llq ttp without rigidity or guarding. Umbilical hernia. No masses or organomegaly MS: no BUE edema Skin: warm and dry  Psych: A&Ox2 with an appropriate affect Neuro: normal speech, thought process intact, gait not assessed  Results for orders placed or performed during the hospital encounter of 06/28/24 (from the past 48 hours)  CBC with Differential/Platelet     Status: Abnormal   Collection Time: 06/30/24  8:44 AM  Result Value Ref Range   WBC 6.9 4.0 - 10.5 K/uL   RBC 4.43 3.87 - 5.11 MIL/uL   Hemoglobin 9.4 (L) 12.0 - 15.0 g/dL   HCT 67.4 (L) 63.9 - 53.9 %   MCV  73.4 (L) 80.0 - 100.0 fL   MCH 21.2 (L) 26.0 - 34.0 pg   MCHC 28.9 (L) 30.0 - 36.0 g/dL   RDW 82.1 (H) 88.4 - 84.4 %   Platelets 462 (H) 150 - 400 K/uL   nRBC 0.0 0.0 - 0.2 %   Neutrophils Relative % 68 %   Neutro Abs 4.7 1.7 - 7.7 K/uL   Lymphocytes Relative 20 %   Lymphs Abs 1.4 0.7 - 4.0 K/uL   Monocytes Relative 10 %   Monocytes Absolute 0.7 0.1 - 1.0 K/uL   Eosinophils Relative 1 %   Eosinophils Absolute 0.1 0.0 - 0.5 K/uL   Basophils Relative 0 %   Basophils Absolute 0.0 0.0 - 0.1 K/uL   Immature Granulocytes 1 %   Abs Immature Granulocytes 0.04 0.00 - 0.07 K/uL    Comment: Performed at [facility], [address] [place], [place] [address]  CBC with Differential/Platelet     Status: Abnormal   Collection Time: 07/01/24  6:21 AM  Result Value Ref Range   WBC 6.8 4.0 - 10.5 K/uL   RBC 4.15 3.87 - 5.11 MIL/uL   Hemoglobin 8.7 (L) 12.0 - 15.0 g/dL    Comment: Reticulocyte Hemoglobin testing may be clinically indicated, consider ordering this additional test [id number]    HCT 31.1 (L) 36.0 - 46.0 %   MCV 74.9 (L) 80.0 - 100.0 fL   MCH 21.0 (L) 26.0 - 34.0 pg   MCHC 28.0 (L) 30.0 - 36.0 g/dL   RDW 81.7 (H) 88.4 - 84.4 %   Platelets 413 (H) 150 - 400 K/uL   nRBC 0.0 0.0 - 0.2 %    Neutrophils Relative % 66 %   Neutro Abs 4.4 1.7 - 7.7 K/uL   Lymphocytes Relative 22 %   Lymphs Abs 1.5 0.7 - 4.0 K/uL   Monocytes Relative 11 %   Monocytes Absolute 0.8 0.1 - 1.0 K/uL   Eosinophils Relative 1 %   Eosinophils Absolute 0.1 0.0 - 0.5 K/uL   Basophils Relative 0 %   Basophils Absolute 0.0 0.0 - 0.1 K/uL   Immature Granulocytes 0 %   Abs Immature Granulocytes 0.03 0.00 - 0.07 K/uL    Comment: Performed at [facility], [address] [place], [place] [address]   No results found.  Anti-infectives (From admission, onward)    None       Assessment/Plan Amy Andrade is a 88 y.o. female with a history of PE (2004) not on anticoagulation, HTN, HLD, asthma, Alzheimer disease who presented with fatigue and had unwitnessed falls at home with findings of H/H 6.9/24 (baseline 9.9/35.6 on 03/07/24) and fecal occult positive. S/p 1U PRBC on 10/12 EGD notable for Large 5 cm hiatal hernia with a couple of ? Cameron lesions and Colonoscopy notable for a non-obstructing partially circumferential proximal ascending colon mass, distal to the ICV with no bleeding was present. Hgb 8.7 this AM from 9.4 HDS currently without tachycardia or hypotension.   Plan - Path pending - Please do not advance past CLD.  - Will discuss with attending about gentle miralax  prep as she still had a moderate amount of stool was found in the entire colon noted on Colonoscopy after Nulytely  prep - CT CAP pending - Check CEA - Discussed right colectomy with patient and patients son, Kaycee Haycraft, who is reported to be her HCPOA. Risks include but are not limited to anesthesia (MI, CVA, prolonged intubation, aspiration, death), pain,  bleeding,  infection, scarring, hernia, damage to surrounding structures (blood vessels/nerves/viscus/organs/ureter), ileus, leak from anastomosis, DVT/PE and possible need for stoma/ostomy. We also discussed typical post-operative care including the possible  need for rehab/snf if necessary. The patient's and families questions were answered to their satisfaction, they voiced understanding and elected to proceed with surgery.   FEN - CLD, BID PPI (for Cameron lesions on EGD), IVF per [facility] VTE - SCDs, chem ppx on hold ID - None currently  I reviewed nursing notes, Consultant GI notes, hospitalist notes, last 24 h vitals and pain scores, last 48 h intake and output, last 24 h labs and trends, and last 24 h imaging results.    Ozell CHRISTELLA Shaper, [facility] 07/01/2024, 4:21 PM Please see Amion for pager number during day hours 7:00am-4:30pm

## 2024-07-01 NOTE — Plan of Care (Signed)
  Problem: Education: Goal: Knowledge of General Education information will improve Description: Including pain rating scale, medication(s)/side effects and non-pharmacologic comfort measures Outcome: Progressing   Problem: Clinical Measurements: Goal: Will remain free from infection Outcome: Progressing Goal: Respiratory complications will improve Outcome: Progressing   Problem: Elimination: Goal: Will not experience complications related to bowel motility Outcome: Progressing Goal: Will not experience complications related to urinary retention Outcome: Progressing   Problem: Pain Managment: Goal: General experience of comfort will improve and/or be controlled Outcome: Progressing   Problem: Safety: Goal: Ability to remain free from injury will improve Outcome: Progressing   Problem: Skin Integrity: Goal: Risk for impaired skin integrity will decrease Outcome: Progressing

## 2024-07-01 NOTE — Interval H&P Note (Signed)
 History and Physical Interval Note:  07/01/2024 1:39 PM  Amy Andrade  has presented today for surgery, with the diagnosis of IDA.  The various methods of treatment have been discussed with the patient and family. After consideration of risks, benefits and other options for treatment, the patient has consented to  Procedure(s) with comments: EGD (ESOPHAGOGASTRODUODENOSCOPY) (Left) - IDA COLONOSCOPY (Left) - IDA as a surgical intervention.  The patient's history has been reviewed, patient examined, no change in status, stable for surgery.  I have reviewed the patient's chart and labs.  Questions were answered to the patient's satisfaction.     Renaye Sous

## 2024-07-01 NOTE — Consult Note (Incomplete)
 Amy Andrade May 24, 1936  [id number].    Requesting MD: Dr. Fredia Skeeter Chief Complaint/Reason for Consult: Bleeding colon mass  HPI: Amy Andrade is a 88 y.o. female with a history of PE (2004) not on anticoagulation, HTN, HLD, asthma, Alzheimer disease who was recently treated for Enterococcus faecalis UTI who presented to the ED for weakness. Patient reports for 3 days prior to presentation she developed increase fatigue, lower back pain and had unwitnessed falls at home. Her workup was notable for H/H 6.9/24 (baseline 9.9/35.6 on 03/07/24) and fecal occult positive. CT CAP showed moderate amount of retained stool in the colon without any acute findings in the A/P. She was admitted to [facility]. Patient was given 1U PRBC on 10/12 with good response to hgb 8.2. Hgb 8.7 this AM from 9.4 from yesterday. GI was consulted and performed EGD and Colonoscopy today. EGD notable for Large 5 cm hiatal hernia with a couple of ? Cameron lesions and Colonoscopy was notable for a large, infiltrative non- obstructing mass was found in the proximal ascending colon, distal to the ICV ; the mass was partially circumferential ( involving one- half of the lumen circumference); the mass measured five cm in length; no bleeding was present; this was biopsied with a cold forceps for histology. General surgery was asked to see.   Patient has a history of prior abdominal hysterectomy. She walks at baseline with a walker.   EGD 07/01/24 by Dr. Kristie  - Normal appearing, widely patent esophagus and GEJ. - Large 5 cm hiatal hernia with a couple of ? Cameron lesions.  - Normal examined duodenum.  - No specimens collected.  Colonoscopy 07/01/24 by Dr. Kristie - Preparation of the colon was fair.  - Poor sphincter tone. - One 7 mm sessile polyp in the mid- transverse colon, removed with a cold snare x 1; resected and retrieved.  - Likely malignant, 5 cm infiltrative mass, tumor in the proximal ascending colon- biopsied.   - Stool in the entire examined colon; aggressive lavage done; small lesions/ polyps could be missed.  ROS: ROS As above, see hpi  Family History  Problem Relation Age of Onset   Alzheimer's disease Mother    Chronic bronchitis Mother    Heart disease Father    High blood pressure Father    High blood pressure Brother    Asthma Daughter    High blood pressure Son    High Cholesterol Son    Post-traumatic stress disorder Son     Past Medical History:  Diagnosis Date   Alzheimer disease (HCC)    Asthma    Cancer (HCC)    Hyperlipidemia    Hypertension    Pulmonary embolism (HCC) 2004   was short of breath--was on coumadin for 6 mos to a year    Past Surgical History:  Procedure Laterality Date   ABDOMINAL HYSTERECTOMY     BUNIONECTOMY     CATARACT EXTRACTION Bilateral 06/17/2022   REPLACEMENT TOTAL KNEE  1980s, 2003   x2    Social History:  reports that she quit smoking about 66 years ago. Her smoking use included cigarettes. She started smoking about 70 years ago. She has a 1 pack-year smoking history. She has never used smokeless tobacco. She reports current alcohol use of about 2.0 standard drinks of alcohol per week. She reports that she does not use drugs.  Allergies: No Known Allergies  Medications Prior to Admission  Medication Sig Dispense Refill   amLODipine  (  NORVASC ) 10 MG tablet Take 1 tablet (10 mg total) by mouth daily. (Patient taking differently: Take 10 mg by mouth in the morning.) 90 tablet 1   aspirin  81 MG tablet Take 81 mg by mouth in the morning.     CALCIUM-VITAMIN D PO Take 1 tablet by mouth 2 (two) times daily.     HYDROcodone -acetaminophen  (NORCO/VICODIN) 5-325 MG tablet Take 1 tablet by mouth every 12 (twelve) hours as needed for severe pain (pain score 7-10). (Patient taking differently: Take 1 tablet by mouth every 12 (twelve) hours as needed for severe pain (pain score 7-10) (shoulder pain keeping her awake).) 30 tablet 0   irbesartan   (AVAPRO ) 300 MG tablet Take 1 tablet (300 mg total) by mouth daily. (Patient taking differently: Take 300 mg by mouth in the morning.) 90 tablet 1   methocarbamol  (ROBAXIN ) 500 MG tablet Take 1 tablet by mouth once daily (Patient taking differently: Take 500 mg by mouth in the morning.) 90 tablet 1   nitroGLYCERIN  (NITROSTAT ) 0.4 MG SL tablet Place 1 tablet (0.4 mg total) under the tongue every 5 (five) minutes x 3 doses as needed for chest pain. 25 tablet 0   nystatin  cream (MYCOSTATIN ) Apply 1 application  topically 2 (two) times daily. (Patient taking differently: Apply 1 application  topically as needed for dry skin.) 30 g 3   pravastatin  (PRAVACHOL ) 40 MG tablet Take 1 tablet (40 mg total) by mouth daily. (Patient taking differently: Take 40 mg by mouth in the morning.) 90 tablet 1   triamcinolone  cream (KENALOG ) 0.1 % Apply 1 Application topically 2 (two) times daily. Affected area on right foot (Patient taking differently: Apply 1 Application topically as needed. Affected area on right foot) 30 g 0     Physical Exam: Blood pressure (!) 105/43, pulse 71, temperature (!) 97.2 F (36.2 C), temperature source Temporal, resp. rate 17, height 5' 10 (1.778 m), weight 80 kg, SpO2 98%. *** General: pleasant, WD/WN ***female who is laying in bed in NAD HEENT: head is normocephalic, atraumatic.  Sclera are non-icteric. Ears and nose without any obvious masses or lesions.  Mouth is pink and moist. Dentition fair*** Heart: regular, rate, and rhythm.   Lungs: CTAB, no wheezes, rhonchi, or rales noted.  Respiratory effort nonlabored Abd: *** Soft, ND, NT, +BS. No masses, hernias, or organomegaly MS: no BUE or BLE edema Skin: warm and dry  Psych: A&Ox4 with an appropriate affect*** Neuro: normal speech, thought process intact, moves all extremities, gait not assessed***   Results for orders placed or performed during the hospital encounter of 06/28/24 (from the past 48 hours)  CBC with  Differential/Platelet     Status: Abnormal   Collection Time: 06/30/24  8:44 AM  Result Value Ref Range   WBC 6.9 4.0 - 10.5 K/uL   RBC 4.43 3.87 - 5.11 MIL/uL   Hemoglobin 9.4 (L) 12.0 - 15.0 g/dL   HCT 67.4 (L) 63.9 - 53.9 %   MCV 73.4 (L) 80.0 - 100.0 fL   MCH 21.2 (L) 26.0 - 34.0 pg   MCHC 28.9 (L) 30.0 - 36.0 g/dL   RDW 82.1 (H) 88.4 - 84.4 %   Platelets 462 (H) 150 - 400 K/uL   nRBC 0.0 0.0 - 0.2 %   Neutrophils Relative % 68 %   Neutro Abs 4.7 1.7 - 7.7 K/uL   Lymphocytes Relative 20 %   Lymphs Abs 1.4 0.7 - 4.0 K/uL   Monocytes Relative 10 %   Monocytes  Absolute 0.7 0.1 - 1.0 K/uL   Eosinophils Relative 1 %   Eosinophils Absolute 0.1 0.0 - 0.5 K/uL   Basophils Relative 0 %   Basophils Absolute 0.0 0.0 - 0.1 K/uL   Immature Granulocytes 1 %   Abs Immature Granulocytes 0.04 0.00 - 0.07 K/uL    Comment: Performed at [facility], [address], [place], [place] [address]  CBC with Differential/Platelet     Status: Abnormal   Collection Time: 07/01/24  6:21 AM  Result Value Ref Range   WBC 6.8 4.0 - 10.5 K/uL   RBC 4.15 3.87 - 5.11 MIL/uL   Hemoglobin 8.7 (L) 12.0 - 15.0 g/dL    Comment: Reticulocyte Hemoglobin testing may be clinically indicated, consider ordering this additional test [id number]    HCT 31.1 (L) 36.0 - 46.0 %   MCV 74.9 (L) 80.0 - 100.0 fL   MCH 21.0 (L) 26.0 - 34.0 pg   MCHC 28.0 (L) 30.0 - 36.0 g/dL   RDW 81.7 (H) 88.4 - 84.4 %   Platelets 413 (H) 150 - 400 K/uL   nRBC 0.0 0.0 - 0.2 %   Neutrophils Relative % 66 %   Neutro Abs 4.4 1.7 - 7.7 K/uL   Lymphocytes Relative 22 %   Lymphs Abs 1.5 0.7 - 4.0 K/uL   Monocytes Relative 11 %   Monocytes Absolute 0.8 0.1 - 1.0 K/uL   Eosinophils Relative 1 %   Eosinophils Absolute 0.1 0.0 - 0.5 K/uL   Basophils Relative 0 %   Basophils Absolute 0.0 0.0 - 0.1 K/uL   Immature Granulocytes 0 %   Abs Immature Granulocytes 0.03 0.00 - 0.07 K/uL    Comment: Performed at [facility], [address], [place], [place] [address]   No results found.  Anti-infectives (From admission, onward)    None       Assessment/Plan ***   FEN - *** VTE - *** ID - *** Foley -  Dispo - Admit to ***.   I reviewed {Reviewed data:26882::last 24 h vitals and pain scores,last 48 h intake and output,last 24 h labs and trends,last 24 h imaging results}.  This care required {MDM levels:26883} level of medical decision making.   Ozell CHRISTELLA Shaper, [facility] 07/01/2024, 2:56 PM Please see Amion for pager number during day hours 7:00am-4:30pm

## 2024-07-02 ENCOUNTER — Encounter (HOSPITAL_COMMUNITY)
Admission: EM | Disposition: A | Discharge status: Skilled Nursing Facility | Payer: Self-pay | Source: Home / Self Care | Attending: Internal Medicine

## 2024-07-02 ENCOUNTER — Observation Stay (HOSPITAL_COMMUNITY)

## 2024-07-02 ENCOUNTER — Inpatient Hospital Stay (HOSPITAL_COMMUNITY): Admitting: Certified Registered"

## 2024-07-02 ENCOUNTER — Encounter (HOSPITAL_COMMUNITY): Payer: Self-pay | Admitting: Gastroenterology

## 2024-07-02 DIAGNOSIS — D62 Acute posthemorrhagic anemia: Secondary | ICD-10-CM | POA: Diagnosis present

## 2024-07-02 DIAGNOSIS — K6389 Other specified diseases of intestine: Secondary | ICD-10-CM | POA: Diagnosis not present

## 2024-07-02 DIAGNOSIS — D630 Anemia in neoplastic disease: Secondary | ICD-10-CM | POA: Diagnosis present

## 2024-07-02 DIAGNOSIS — D649 Anemia, unspecified: Secondary | ICD-10-CM | POA: Diagnosis not present

## 2024-07-02 DIAGNOSIS — D509 Iron deficiency anemia, unspecified: Secondary | ICD-10-CM | POA: Diagnosis present

## 2024-07-02 DIAGNOSIS — J45909 Unspecified asthma, uncomplicated: Secondary | ICD-10-CM | POA: Diagnosis present

## 2024-07-02 DIAGNOSIS — Z7982 Long term (current) use of aspirin: Secondary | ICD-10-CM | POA: Diagnosis not present

## 2024-07-02 DIAGNOSIS — E785 Hyperlipidemia, unspecified: Secondary | ICD-10-CM | POA: Diagnosis not present

## 2024-07-02 DIAGNOSIS — K922 Gastrointestinal hemorrhage, unspecified: Secondary | ICD-10-CM | POA: Diagnosis not present

## 2024-07-02 DIAGNOSIS — N179 Acute kidney failure, unspecified: Secondary | ICD-10-CM | POA: Diagnosis present

## 2024-07-02 DIAGNOSIS — I951 Orthostatic hypotension: Secondary | ICD-10-CM | POA: Diagnosis present

## 2024-07-02 DIAGNOSIS — D123 Benign neoplasm of transverse colon: Secondary | ICD-10-CM | POA: Diagnosis present

## 2024-07-02 DIAGNOSIS — R3915 Urgency of urination: Secondary | ICD-10-CM | POA: Diagnosis not present

## 2024-07-02 DIAGNOSIS — I10 Essential (primary) hypertension: Secondary | ICD-10-CM | POA: Diagnosis not present

## 2024-07-02 DIAGNOSIS — Z87891 Personal history of nicotine dependence: Secondary | ICD-10-CM | POA: Diagnosis not present

## 2024-07-02 DIAGNOSIS — G309 Alzheimer's disease, unspecified: Secondary | ICD-10-CM | POA: Diagnosis not present

## 2024-07-02 DIAGNOSIS — K449 Diaphragmatic hernia without obstruction or gangrene: Secondary | ICD-10-CM | POA: Diagnosis present

## 2024-07-02 DIAGNOSIS — C182 Malignant neoplasm of ascending colon: Secondary | ICD-10-CM

## 2024-07-02 DIAGNOSIS — I700 Atherosclerosis of aorta: Secondary | ICD-10-CM | POA: Diagnosis present

## 2024-07-02 DIAGNOSIS — F0280 Dementia in other diseases classified elsewhere without behavioral disturbance: Secondary | ICD-10-CM | POA: Diagnosis present

## 2024-07-02 DIAGNOSIS — C189 Malignant neoplasm of colon, unspecified: Secondary | ICD-10-CM | POA: Diagnosis not present

## 2024-07-02 DIAGNOSIS — E871 Hypo-osmolality and hyponatremia: Secondary | ICD-10-CM | POA: Diagnosis present

## 2024-07-02 DIAGNOSIS — E538 Deficiency of other specified B group vitamins: Secondary | ICD-10-CM | POA: Diagnosis present

## 2024-07-02 DIAGNOSIS — M25511 Pain in right shoulder: Secondary | ICD-10-CM | POA: Diagnosis not present

## 2024-07-02 HISTORY — PX: LAPAROSCOPIC RIGHT COLECTOMY: SHX5925

## 2024-07-02 LAB — BASIC METABOLIC PANEL WITH GFR
Anion gap: 10 (ref 5–15)
BUN: <5 mg/dL — ABNORMAL LOW (ref 8–23)
CO2: 24 mmol/L (ref 22–32)
Calcium: 9.4 mg/dL (ref 8.9–10.3)
Chloride: 96 mmol/L — ABNORMAL LOW (ref 98–111)
Creatinine, Ser: 0.72 mg/dL (ref 0.44–1.00)
GFR, Estimated: >60 mL/min (ref >60)
Glucose, Bld: 107 mg/dL — ABNORMAL HIGH (ref 70–99)
Potassium: 3.8 mmol/L (ref 3.5–5.1)
Sodium: 130 mmol/L — ABNORMAL LOW (ref 135–145)

## 2024-07-02 LAB — CBC WITH DIFFERENTIAL/PLATELET
Abs Immature Granulocytes: 0.05 K/uL (ref 0.00–0.07)
Basophils Absolute: 0 K/uL (ref 0.0–0.1)
Basophils Relative: 0 %
Eosinophils Absolute: 0.1 K/uL (ref 0.0–0.5)
Eosinophils Relative: 1 %
HCT: 29.2 % — ABNORMAL LOW (ref 36.0–46.0)
Hemoglobin: 8.7 g/dL — ABNORMAL LOW (ref 12.0–15.0)
Immature Granulocytes: 1 %
Lymphocytes Relative: 17 %
Lymphs Abs: 1.3 K/uL (ref 0.7–4.0)
MCH: 22.2 pg — ABNORMAL LOW (ref 26.0–34.0)
MCHC: 29.8 g/dL — ABNORMAL LOW (ref 30.0–36.0)
MCV: 74.5 fL — ABNORMAL LOW (ref 80.0–100.0)
Monocytes Absolute: 0.8 K/uL (ref 0.1–1.0)
Monocytes Relative: 10 %
Neutro Abs: 5.7 K/uL (ref 1.7–7.7)
Neutrophils Relative %: 71 %
Platelets: 425 K/uL — ABNORMAL HIGH (ref 150–400)
RBC: 3.92 MIL/uL (ref 3.87–5.11)
RDW: 18.3 % — ABNORMAL HIGH (ref 11.5–15.5)
WBC: 7.9 K/uL (ref 4.0–10.5)
nRBC: 0 % (ref 0.0–0.2)

## 2024-07-02 SURGERY — COLECTOMY, RIGHT, LAPAROSCOPIC
Anesthesia: General

## 2024-07-02 MED ORDER — ACETAMINOPHEN 500 MG PO TABS
1000.0000 mg | ORAL_TABLET | ORAL | Status: AC
  Administered 2024-07-02: 1000 mg via ORAL
  Filled 2024-07-02: qty 2

## 2024-07-02 MED ORDER — SODIUM CHLORIDE 0.9% FLUSH
10.0000 mL | Freq: Two times a day (BID) | INTRAVENOUS | Status: DC
  Administered 2024-07-02 – 2024-07-10 (×14): 10 mL

## 2024-07-02 MED ORDER — IOHEXOL 9 MG/ML PO SOLN: 500.0000 mL | ORAL | Status: AC

## 2024-07-02 MED ORDER — FENTANYL CITRATE (PF) 50 MCG/ML IJ SOSY
50.0000 ug | PREFILLED_SYRINGE | Freq: Once | INTRAMUSCULAR | Status: AC
  Administered 2024-07-02: 50 ug via INTRAVENOUS

## 2024-07-02 MED ORDER — FENTANYL CITRATE (PF) 50 MCG/ML IJ SOSY
PREFILLED_SYRINGE | INTRAMUSCULAR | Status: AC
  Filled 2024-07-02: qty 2

## 2024-07-02 MED ORDER — SODIUM CHLORIDE 0.9 % IV SOLN
2.0000 g | INTRAVENOUS | Status: AC
  Administered 2024-07-02: 2 g via INTRAVENOUS
  Filled 2024-07-02 (×2): qty 2

## 2024-07-02 MED ORDER — FENTANYL CITRATE (PF) 100 MCG/2ML IJ SOLN
INTRAMUSCULAR | Status: DC | PRN
  Administered 2024-07-02: 25 ug via INTRAVENOUS
  Administered 2024-07-02: 50 ug via INTRAVENOUS

## 2024-07-02 MED ORDER — ROCURONIUM BROMIDE 10 MG/ML (PF) SYRINGE
PREFILLED_SYRINGE | INTRAVENOUS | Status: DC | PRN
  Administered 2024-07-02: 60 mg via INTRAVENOUS

## 2024-07-02 MED ORDER — CHLORHEXIDINE GLUCONATE CLOTH 2 % EX PADS
6.0000 | MEDICATED_PAD | Freq: Every day | CUTANEOUS | Status: DC
  Administered 2024-07-02 – 2024-07-10 (×9): 6 via TOPICAL

## 2024-07-02 MED ORDER — ONDANSETRON HCL 4 MG/2ML IJ SOLN
INTRAMUSCULAR | Status: DC | PRN
  Administered 2024-07-02: 4 mg via INTRAVENOUS

## 2024-07-02 MED ORDER — KETOROLAC TROMETHAMINE 15 MG/ML IJ SOLN
15.0000 mg | Freq: Once | INTRAMUSCULAR | Status: AC
  Administered 2024-07-02: 15 mg via INTRAVENOUS

## 2024-07-02 MED ORDER — ACETAMINOPHEN 10 MG/ML IV SOLN
INTRAVENOUS | Status: AC
  Filled 2024-07-02: qty 100

## 2024-07-02 MED ORDER — FENTANYL CITRATE (PF) 50 MCG/ML IJ SOSY
25.0000 ug | PREFILLED_SYRINGE | INTRAMUSCULAR | Status: DC | PRN
  Administered 2024-07-02 (×4): 25 ug via INTRAVENOUS

## 2024-07-02 MED ORDER — PROPOFOL 10 MG/ML IV BOLUS
INTRAVENOUS | Status: DC | PRN
  Administered 2024-07-02: 70 mg via INTRAVENOUS

## 2024-07-02 MED ORDER — ONDANSETRON HCL 4 MG/2ML IJ SOLN: 4.0000 mg | Freq: Once | INTRAMUSCULAR | Status: DC | PRN

## 2024-07-02 MED ORDER — ALVIMOPAN 12 MG PO CAPS
12.0000 mg | ORAL_CAPSULE | ORAL | Status: AC
  Administered 2024-07-02: 12 mg via ORAL
  Filled 2024-07-02 (×2): qty 1

## 2024-07-02 MED ORDER — DEXAMETHASONE SOD PHOSPHATE PF 10 MG/ML IJ SOLN
INTRAMUSCULAR | Status: DC | PRN
  Administered 2024-07-02: 10 mg via INTRAVENOUS

## 2024-07-02 MED ORDER — LACTATED RINGERS IV SOLN: INTRAVENOUS | Status: DC

## 2024-07-02 MED ORDER — SODIUM CHLORIDE 0.9% FLUSH: 10.0000 mL | INTRAVENOUS | Status: DC | PRN

## 2024-07-02 MED ORDER — LIDOCAINE HCL (PF) 2 % IJ SOLN
INTRAMUSCULAR | Status: AC
  Filled 2024-07-02: qty 5

## 2024-07-02 MED ORDER — LIDOCAINE HCL (PF) 2 % IJ SOLN
INTRAMUSCULAR | Status: DC | PRN
  Administered 2024-07-02: 60 mg via INTRADERMAL

## 2024-07-02 MED ORDER — PROPOFOL 10 MG/ML IV BOLUS
INTRAVENOUS | Status: AC
  Filled 2024-07-02: qty 20

## 2024-07-02 MED ORDER — ACETAMINOPHEN 10 MG/ML IV SOLN
1000.0000 mg | Freq: Once | INTRAVENOUS | Status: AC
  Administered 2024-07-02: 1000 mg via INTRAVENOUS

## 2024-07-02 MED ORDER — BUPIVACAINE-EPINEPHRINE (PF) 0.25% -1:200000 IJ SOLN
INTRAMUSCULAR | Status: DC | PRN
  Administered 2024-07-02: 30 mL

## 2024-07-02 MED ORDER — LACTATED RINGERS IV SOLN: INTRAVENOUS | Status: DC | PRN

## 2024-07-02 MED ORDER — TRAMADOL HCL 50 MG PO TABS
50.0000 mg | ORAL_TABLET | Freq: Four times a day (QID) | ORAL | Status: DC | PRN
Start: 2024-07-02 — End: 2024-07-10
  Administered 2024-07-02 – 2024-07-09 (×6): 50 mg via ORAL
  Filled 2024-07-02 (×7): qty 1

## 2024-07-02 MED ORDER — KETOROLAC TROMETHAMINE 15 MG/ML IJ SOLN
INTRAMUSCULAR | Status: AC
  Filled 2024-07-02: qty 1

## 2024-07-02 MED ORDER — FENTANYL CITRATE (PF) 50 MCG/ML IJ SOSY
PREFILLED_SYRINGE | INTRAMUSCULAR | Status: AC
  Filled 2024-07-02: qty 1

## 2024-07-02 MED ORDER — IOHEXOL 300 MG/ML  SOLN
100.0000 mL | Freq: Once | INTRAMUSCULAR | Status: AC | PRN
Start: 2024-07-02 — End: 2024-07-02
  Administered 2024-07-02: 100 mL via INTRAVENOUS

## 2024-07-02 MED ORDER — ROCURONIUM BROMIDE 10 MG/ML (PF) SYRINGE
PREFILLED_SYRINGE | INTRAVENOUS | Status: AC
  Filled 2024-07-02: qty 10

## 2024-07-02 MED ORDER — SUGAMMADEX SODIUM 200 MG/2ML IV SOLN
INTRAVENOUS | Status: DC | PRN
Start: 2024-07-02 — End: 2024-07-02
  Administered 2024-07-02: 200 mg via INTRAVENOUS

## 2024-07-02 MED ORDER — PHENYLEPHRINE 80 MCG/ML (10ML) SYRINGE FOR IV PUSH (FOR BLOOD PRESSURE SUPPORT)
PREFILLED_SYRINGE | INTRAVENOUS | Status: DC | PRN
  Administered 2024-07-02: 160 ug via INTRAVENOUS

## 2024-07-02 MED ORDER — FENTANYL CITRATE (PF) 100 MCG/2ML IJ SOLN
INTRAMUSCULAR | Status: AC
  Filled 2024-07-02: qty 2

## 2024-07-02 MED ORDER — MORPHINE SULFATE (PF) 2 MG/ML IV SOLN
1.0000 mg | INTRAVENOUS | Status: DC | PRN
  Administered 2024-07-02: 1 mg via INTRAVENOUS
  Filled 2024-07-02: qty 1

## 2024-07-02 MED ORDER — CHLORHEXIDINE GLUCONATE 0.12 % MT SOLN
15.0000 mL | Freq: Once | OROMUCOSAL | Status: AC
  Administered 2024-07-02: 15 mL via OROMUCOSAL

## 2024-07-02 MED ORDER — SODIUM CHLORIDE 0.9% IV SOLUTION: Freq: Once | INTRAVENOUS | Status: DC

## 2024-07-02 SURGICAL SUPPLY — 67 items
BAG COUNTER SPONGE SURGICOUNT (BAG) IMPLANT
BLADE EXTENDED COATED 6.5IN (ELECTRODE) IMPLANT
CABLE HIGH FREQUENCY MONO STRZ (ELECTRODE) IMPLANT
CLIP APPLIE 5 13 M/L LIGAMAX5 (MISCELLANEOUS) IMPLANT
CLIP APPLIE ROT 10 11.4 M/L (STAPLE) IMPLANT
DERMABOND ADVANCED .7 DNX12 (GAUZE/BANDAGES/DRESSINGS) IMPLANT
DISSECTOR BLUNT TIP ENDO 5MM (MISCELLANEOUS) IMPLANT
DRAIN CHANNEL 19F RND (DRAIN) IMPLANT
DRAPE SURG IRRIG POUCH 19X23 (DRAPES) ×1 IMPLANT
DRSG OPSITE POSTOP 4X10 (GAUZE/BANDAGES/DRESSINGS) IMPLANT
DRSG OPSITE POSTOP 4X6 (GAUZE/BANDAGES/DRESSINGS) IMPLANT
DRSG OPSITE POSTOP 4X8 (GAUZE/BANDAGES/DRESSINGS) IMPLANT
ELECT PENCIL ROCKER SW 15FT (MISCELLANEOUS) ×1 IMPLANT
ELECT REM PT RETURN 15FT ADLT (MISCELLANEOUS) ×1 IMPLANT
EVACUATOR SILICONE 100CC (DRAIN) IMPLANT
GAUZE SPONGE 4X4 12PLY STRL (GAUZE/BANDAGES/DRESSINGS) IMPLANT
GLOVE BIO SURGEON STRL SZ7 (GLOVE) ×2 IMPLANT
GLOVE BIOGEL PI IND STRL 7.5 (GLOVE) ×2 IMPLANT
GOWN STRL REUS W/ TWL XL LVL3 (GOWN DISPOSABLE) ×2 IMPLANT
HOLDER FOLEY CATH W/STRAP (MISCELLANEOUS) ×1 IMPLANT
IRRIGATION SUCT STRKRFLW 2 WTP (MISCELLANEOUS) ×1 IMPLANT
KIT SIGMOIDOSCOPE (SET/KITS/TRAYS/PACK) IMPLANT
KIT TURNOVER KIT A (KITS) ×1 IMPLANT
LIGASURE IMPACT 36 18CM CVD LR (INSTRUMENTS) IMPLANT
LIGASURE LAP MARYLAND 5MM 37CM (ELECTROSURGICAL) IMPLANT
NDL INSUFFLATION 14GA 120MM (NEEDLE) IMPLANT
NEEDLE INSUFFLATION 14GA 120MM (NEEDLE) IMPLANT
PACK COLON (CUSTOM PROCEDURE TRAY) ×1 IMPLANT
PAD POSITIONING PINK XL (MISCELLANEOUS) ×1 IMPLANT
RELOAD STAPLE 60 2.6 WHT THN (STAPLE) IMPLANT
RELOAD STAPLE 60 3.6 BLU REG (STAPLE) IMPLANT
RELOAD STAPLE 75 3.8 BLU REG (ENDOMECHANICALS) IMPLANT
RETRACTOR WND ALEXIS 18 MED (MISCELLANEOUS) IMPLANT
SCISSORS LAP 5X35 DISP (ENDOMECHANICALS) ×1 IMPLANT
SEALER TISSUE G2 STRG ARTC 35C (ENDOMECHANICALS) IMPLANT
SET TUBE SMOKE EVAC HIGH FLOW (TUBING) ×1 IMPLANT
SHEARS HARMONIC 36 ACE (MISCELLANEOUS) IMPLANT
SLEEVE ADV FIXATION 5X100MM (TROCAR) ×1 IMPLANT
SPIKE FLUID TRANSFER (MISCELLANEOUS) ×1 IMPLANT
SPONGE DRAIN TRACH 4X4 STRL 2S (GAUZE/BANDAGES/DRESSINGS) IMPLANT
STAPLER 90 3.5 STD SLIM (STAPLE) IMPLANT
STAPLER ECHELON LONG 60 440 (INSTRUMENTS) IMPLANT
STAPLER ECHELON POWER CIR 29 (STAPLE) IMPLANT
STAPLER ECHELON POWER CIR 31 (STAPLE) IMPLANT
STAPLER GUN LINEAR PROX 60 (STAPLE) IMPLANT
STAPLER PROXIMATE 75MM BLUE (STAPLE) IMPLANT
STAPLER SKIN PROX 35W (STAPLE) IMPLANT
SUT ETHILON 3 0 PS 1 (SUTURE) IMPLANT
SUT MNCRL AB 4-0 PS2 18 (SUTURE) IMPLANT
SUT PDS AB 1 TP1 54 (SUTURE) IMPLANT
SUT PDS AB 1 TP1 96 (SUTURE) IMPLANT
SUT PROLENE 2 0 KS (SUTURE) ×1 IMPLANT
SUT PROLENE 2 0 SH DA (SUTURE) ×1 IMPLANT
SUT SILK 2 0 SH CR/8 (SUTURE) ×1 IMPLANT
SUT SILK 2-0 18XBRD TIE 12 (SUTURE) ×1 IMPLANT
SUT SILK 3 0 SH CR/8 (SUTURE) ×1 IMPLANT
SUT SILK 3-0 18XBRD TIE 12 (SUTURE) ×1 IMPLANT
SUT VIC AB 2-0 SH 27X BRD (SUTURE) IMPLANT
SUT VIC AB 3-0 SH 18 (SUTURE) IMPLANT
SUT VIC AB 3-0 SH 27X BRD (SUTURE) IMPLANT
SUT VICRYL AB 2 0 TIES (SUTURE) ×1 IMPLANT
SYSTEM LAPSCP GELPORT 120MM (MISCELLANEOUS) IMPLANT
SYSTEM WOUND ALEXIS 18CM MED (MISCELLANEOUS) IMPLANT
TRAY IRRIG W/60CC SYR STRL (SET/KITS/TRAYS/PACK) ×1 IMPLANT
TROCAR ADV FIXATION 12X100MM (TROCAR) ×1 IMPLANT
TROCAR ADV FIXATION 5X100MM (TROCAR) ×1 IMPLANT
TROCAR BALLN 12MMX100 BLUNT (TROCAR) IMPLANT

## 2024-07-02 NOTE — Plan of Care (Signed)
  Problem: Safety: Goal: Ability to remain free from injury will improve Outcome: Progressing   Problem: Skin Integrity: Goal: Risk for impaired skin integrity will decrease Outcome: Progressing   Problem: Education: Goal: Knowledge of General Education information will improve Description: Including pain rating scale, medication(s)/side effects and non-pharmacologic comfort measures Outcome: Not Progressing   Problem: Health Behavior/Discharge Planning: Goal: Ability to manage health-related needs will improve Outcome: Not Progressing   Problem: Clinical Measurements: Goal: Ability to maintain clinical measurements within normal limits will improve Outcome: Not Progressing Goal: Will remain free from infection Outcome: Not Progressing Goal: Diagnostic test results will improve Outcome: Not Progressing Goal: Respiratory complications will improve Outcome: Not Progressing Goal: Cardiovascular complication will be avoided Outcome: Not Progressing   Problem: Activity: Goal: Risk for activity intolerance will decrease Outcome: Not Progressing   Problem: Nutrition: Goal: Adequate nutrition will be maintained Outcome: Not Progressing   Problem: Coping: Goal: Level of anxiety will decrease Outcome: Not Progressing   Problem: Elimination: Goal: Will not experience complications related to bowel motility Outcome: Not Progressing Goal: Will not experience complications related to urinary retention Outcome: Not Progressing   Problem: Pain Managment: Goal: General experience of comfort will improve and/or be controlled Outcome: Not Progressing

## 2024-07-02 NOTE — Op Note (Signed)
 PATIENT: Amy Andrade  88 y.o. female  Patient Care Team: Ngetich, Roxan BROCKS, NP as PCP - General (Family Medicine) Cyrus Carwin, MD as Consulting Physician (Ophthalmology)  PREOP DIAGNOSIS: Ascending colon mass  POSTOP DIAGNOSIS: Same  PROCEDURE: Laparoscopic assisted right colectomy  SURGEON: Cordella Idler, MD  ASSISTANT: None  ANESTHESIA: General endotracheal  EBL: Total I/O In: -  Out: 850 [Urine:700; Blood:150]  DRAINS: None  SPECIMEN: Right colon and terminal ileum  COUNTS: Sponge, needle and instrument counts were reported correct x2  FINDINGS: No obvious metastatic disease on visceral parietal peritoneum or liver.  STATEMENT OF MEDICAL NECESSITY: Amy Andrade is a 88 y.o. female who presented to the hospital with anemia.  She was evaluated with a colonoscopy and found to have a bleeding mass within the ascending colon concerning for malignancy.  Given her anemia and the presence of the mass, I offered a laparoscopic assisted right colectomy.  The patient has dementia and is unable to consent for herself.  I consented her son.  I addressed all of his questions.  Written consent was obtained.   NARRATIVE:  The patient was seen in the pre-op holding area. The risks, benefits, complications, treatment options, and expected outcomes were previously discussed with the patient. The patient agreed with the proposed plan and has signed the informed consent form. The patient was brought to the operating room by the surgical team, identified as Amy PARAS Dulworth, and the procedure verified. placed supine on the operating table and SCD's were applied. General endotracheal anesthesia was induced without difficulty. She was then positioned supine with arms tucked.  Pressure points were evaluated and padded.  A foley catheter was then placed by nursing under sterile conditions. Hair on the abdomen was clipped.  She was secured to the operating table. The abdomen was then  prepped and draped in the standard sterile fashion. A time out was completed and the above information confirmed and need for preoperative antibiotics.  I obtained abdominal access using a Veress needle in the left upper quadrant at Palmer's point.  Following a positive saline drop test and aspiration of air, the insufflation tubing was connected the abdomen brought to 15 mmHg. A 5mm trocar was placed in the left lower quadrant using an optiview technique. A laparoscope was placed and camera inspection revealed no evidence of injury. Additional ports were then placed under direct laparoscopic visualization - another in the left abdomen and one infraumbilical, within her previous scar. The abdomen was surveyed. The liver and peritoneum appeared normal.  There were no signs of metastatic disease.  The patient was positioned in trendelenburg with left side down. The ileocolic pedicle was identified.  There were few adhesions between the small bowel and cecum and the pelvic sidewall.  These were taken with the bipolar cautery device.  I then began mobilizing the colon from lateral to medial along the white line.  I carried the dissection up to the hepatic flexure and then took down the hepatic flexure. I mobilized the omentum off of the right transverse colon. The entire colon was then flipped medially and mobilized off of the retroperitoneal structures until I could visualize the lateral edge of the duodenum underneath.  I gently freed the duodenal attachments.   At this point, there was appropriate reach with the colon to reach her infraumbilical incision. I extended the infraumbilical incision several centimeters and then placed an Alexis wound protector. The abdomen was desufflated and the terminal ileum and right colon delivered  through the wound protector. The terminal ileum was then transected using a GIA blue load stapler. The remaining mesentery was divided using the harmonic device. The divided  mesentery was inspected and noted to be hemostatic. The distal point of transection was then identified on the transverse colon at a location that included the right branch of the middle colics, leaving the main middle colic feeding the remaining transverse colon. This was transected using another blue load GIA stapler.  The specimen was then passed off. Attention was turned to creating the anastomosis. The terminal ileum and transverse colon were inspected for orientation to ensure no twisting nor bowel included in the mesenteric defect. An anastomosis was created between the terminal ileum and the transverse colon using a 75 mm GIA blue load stapler. The staple line was inspected and noted to be hemostatic.  The common enterotomy channel was closed using a TA 60 blue load stapler. Hemostasis was achieved at the staple line using 3-0 silk U-stitches. 3-0 silk sutures were used to imbricate the corners of the staple line as well.  A 2-0 silk suture was placed securing the crotch of the anastomosis. The anastomosis was palpated and noted to be widely patent. This was then placed back into the abdomen. The abdomen was then irrigated with sterile saline and hemostasis verified. The wound protector cap was replaced and Co2 reinsufflated. The laparoscopic ports were removed under direct visualization and the sites noted to be hemostatic. The Alexis wound protector was removed, counts were reported correct, and we switched to clean instruments, gowns and drapes. The fascia was then closed using two running #1 PDS sutures.  The skin of all incision sites was closed with 4-0 monocryl subcuticular suture. Dermabond was placed on all incisions. All counts were reported correct. The patient was then awakened from anesthesia and sent to the post anesthesia care unit in stable condition.   DISPOSITION: PACU in satisfactory condition

## 2024-07-02 NOTE — Progress Notes (Signed)
 Called patients son [name] prior to patients procedure. Informed him that consent needed to be signed and to meet patient in short stay unit. Pt verbalized understanding.

## 2024-07-02 NOTE — Anesthesia Preprocedure Evaluation (Addendum)
 Anesthesia Evaluation  Patient identified by MRN, date of birth, ID band Patient awake and Patient confused    Reviewed: Allergy & Precautions, NPO status , Patient's Chart, lab work & pertinent test results  Airway Mallampati: II  TM Distance: >3 FB Neck ROM: Full    Dental  (+) Teeth Intact, Dental Advisory Given   Pulmonary asthma , former smoker, PE   Pulmonary exam normal breath sounds clear to auscultation       Cardiovascular hypertension, Pt. on medications Normal cardiovascular exam Rhythm:Regular Rate:Normal     Neuro/Psych  PSYCHIATRIC DISORDERS     Dementia negative neurological ROS     GI/Hepatic Neg liver ROS,,,COLON CANCER   Endo/Other  negative endocrine ROS    Renal/GU negative Renal ROS  negative genitourinary   Musculoskeletal negative musculoskeletal ROS (+)    Abdominal   Peds  Hematology  (+) Blood dyscrasia, anemia Lab Results      Component                Value               Date                      WBC                      7.9                 07/02/2024                HGB                      8.7 (L)             07/02/2024                HCT                      29.2 (L)            07/02/2024                MCV                      74.5 (L)            07/02/2024                PLT                      425 (H)             07/02/2024           \  Lab Results      Component                Value               Date                      NA                       130 (L)             07/02/2024                K  3.8                 07/02/2024                CO2                      24                  07/02/2024                GLUCOSE                  107 (H)             07/02/2024                BUN                      <5 (L)              07/02/2024                CREATININE               0.72                07/02/2024                CALCIUM                  9.4                  07/02/2024                EGFR                     78                  03/08/2023                GFRNONAA                 >60                 07/02/2024              Anesthesia Other Findings   Reproductive/Obstetrics                              Anesthesia Physical Anesthesia Plan  ASA: 3  Anesthesia Plan: General   Post-op Pain Management: Ofirmev  IV (intra-op)*   Induction: Intravenous  PONV Risk Score and Plan: 3 and Ondansetron , Dexamethasone and Treatment may vary due to age or medical condition  Airway Management Planned: Mask and Oral ETT  Additional Equipment: None  Intra-op Plan:   Post-operative Plan: Possible Post-op intubation/ventilation  Informed Consent: I have reviewed the patients History and Physical, chart, labs and discussed the procedure including the risks, benefits and alternatives for the proposed anesthesia with the patient or authorized representative who has indicated his/her understanding and acceptance.     Dental advisory given and Consent reviewed with POA  Plan Discussed with: CRNA  Anesthesia Plan Comments: (Large bore PIV after induction. Will need new type and screen.)         Anesthesia Quick Evaluation

## 2024-07-02 NOTE — Progress Notes (Signed)
 Pt alert and oriented x1 per baseline. At shift change pt was agitated and removed central line dressing, surgical honeycomb dressing and peripheral IV. Pt stable and no acute distress or bleeding. Pt reoriented. IV team changed dressing. Night shift RN to report to overnight team.

## 2024-07-02 NOTE — Anesthesia Postprocedure Evaluation (Signed)
 Anesthesia Post Note  Patient: Norita Meigs Fournier  Procedure(s) Performed: COLECTOMY, RIGHT, LAPAROSCOPIC     Patient location during evaluation: PACU Anesthesia Type: General Level of consciousness: awake and alert Pain management: pain level controlled Vital Signs Assessment: post-procedure vital signs reviewed and stable Respiratory status: spontaneous breathing, nonlabored ventilation, respiratory function stable and patient connected to nasal cannula oxygen Cardiovascular status: blood pressure returned to baseline and stable Postop Assessment: no apparent nausea or vomiting Anesthetic complications: no   No notable events documented.  Last Vitals:  Vitals:   07/02/24 1122 07/02/24 1240  BP: (!) 169/69 (!) 160/74  Pulse:  76  Resp:  16  Temp:  36.8 C  SpO2:  100%    Last Pain:  Vitals:   07/02/24 1240  TempSrc: Oral  PainSc: 0-No pain                 Garnette FORBES Skillern

## 2024-07-02 NOTE — Progress Notes (Signed)
 PROGRESS NOTE    Amy Andrade  FMW:[id number] DOB: [birth year] DOA: 06/28/2024 PCP: [name], NP   Brief Narrative:  Amy Andrade is a 88 y.o. female with medical history significant for Alzheimer's dementia, HTN, HLD, chronic pain due to OA of both shoulders and chronic left shoulder dislocation who presented to the ED for evaluation of generalized weakness and no other complaint. She notes frequent urinary urgency.  She was recently treated for UTI initially with ciprofloxacin .  Urine culture grew Enterococcus faecalis and antibiotics were switched to nitrofurantoin  which she has since completed.  Upon arrival to ED, hemodynamically stable. Labs showed hemoglobin 6.9,LFTs within normal limits, troponin T 27 > 23.  FOBT positive.  Dark stool on DRE per EDP.  UA unremarkable.  Multiple CT scans were done including CT head, cervical spine, thoracic spine, lumbar spine, chest abdomen and pelvis and no significant acute pathology was found.  Patient was admitted for further workup of anemia and GI consulted.  Details below.  Assessment & Plan:   Principal Problem:   Symptomatic anemia Active Problems:   Alzheimer disease (HCC)   Hyperlipidemia   Hypertension   Chronic hyponatremia  Symptomatic microcytic anemia/B12 deficiency/chronic mass: Found to have B12 deficiency (B12 level<150).  FOBT is positive.  Hemoglobin 6.9 on admission compared to 9.9 this past June .  Status post 1 unit of PRBC transfusion.  Hemoglobin 8.2.  CBC pending this morning though.  Status post EGD and colonoscopy, was found to have nonobstructive colonic mass, biopsy taken.  Received a call from Dr. Kristie afternoon 07/01/2024.  Consulted general surgery and ordered CT chest abdomen pelvis for staging purposes.  Pathology still pending.  Will need oncology consult once pathology returns.  Continue Protonix twice daily.  Seen by general surgery, they have offered right hemicolectomy to the patient and have  discussed with the family as well.  CEA pending.  CT chest abdomen pelvis ordered yesterday for staging purposes which is still pending.  Continue B12 supplement.  She is n.p.o. for potential surgery today.  Appreciate GI and general surgery.   Hypertension: Blood pressure elevated at times.  Continue amlodipine , resume irbesartan .   Hyperlipidemia: Continue pravastatin .   Chronic mild hyponatremia: Asymptomatic.  Continue monitor.   Urinary frequency/urgency: Recently treated for Enterococcus faecalis UTI.  UA with large leukocytes, 11-20 WBCs, rare bacteria.  Urine culture negative.   Alzheimer's dementia: Continue fall and delirium precautions.  Mild renal insufficiency: Patient's creatinine at baseline is around 0.7-0.8, presented with creatinine of 1.09.  Did not really meet criteria for AKI.  Renal function back to baseline.  DVT prophylaxis: SCDs Start: 06/28/24 1944   Code Status: Full Code  Family Communication:  None present at bedside.  Plan of care discussed with patient  Status is: Observation The patient will require care spanning > 2 midnights and should be moved to inpatient because: Scheduled for EGD and colonoscopy today.   Estimated body mass index is 25.31 kg/m as calculated from the following:   Height as of this encounter: 5' 10 (1.778 m).   Weight as of this encounter: 80 kg.    Nutritional Assessment: Body mass index is 25.31 kg/m.[name] Seen by dietician.  I agree with the assessment and plan as outlined below: Nutrition Status:        . Skin Assessment: I have examined the patient's skin and I agree with the wound assessment as performed by the wound care RN as outlined below:    Consultants:  None  Procedures:  None  Antimicrobials:  Anti-infectives (From admission, onward)    None         Subjective: Patient seen and examined, son at the bedside.  Patient has no complaints.  Son has no questions.  He is fully aware of the plan by  general surgery.  Objective: Vitals:   07/01/24 1510 07/01/24 1523 07/01/24 1957 07/02/24 0506  BP: (!) 148/68 (!) 165/72 (!) 160/74 (!) 159/66  Pulse: 65 66 81 65  Resp: 19 16 18 20   Temp:  (!) 97.4 F (36.3 C) 98.5 F (36.9 C) 98.4 F (36.9 C)  TempSrc:  Oral    SpO2: 96% 99% 98% 100%  Weight:      Height:        Intake/Output Summary (Last 24 hours) at 07/02/2024 0833 Last data filed at 07/02/2024 0500 Gross per 24 hour  Intake 740 ml  Output 1700 ml  Net -960 ml   Filed Weights   06/28/24 2343  Weight: 80 kg    Examination:  General exam: Appears calm and comfortable  Respiratory system: Clear to auscultation. Respiratory effort normal. Cardiovascular system: S1 & S2 heard, RRR. No JVD, murmurs, rubs, gallops or clicks. No pedal edema. Gastrointestinal system: Abdomen is nondistended, soft and nontender. No organomegaly or masses felt. Normal bowel sounds heard. Central nervous system: Alert and oriented. No focal neurological deficits. Extremities: Symmetric 5 x 5 power. Skin: No rashes, lesions or ulcers.  Psychiatry: Judgement and insight appear normal. Mood & affect appropriate.   Data Reviewed: I have personally reviewed following labs and imaging studies  CBC: Recent Labs  Lab 06/28/24 1352 06/29/24 0524 06/30/24 0844 07/01/24 0621  WBC 7.6 7.2 6.9 6.8  NEUTROABS 5.5  --  4.7 4.4  HGB 6.9* 8.2* 9.4* 8.7*  HCT 24.0* 27.8* 32.5* 31.1*  MCV 73.4* 73.2* 73.4* 74.9*  PLT 471* 431* 462* 413*   Basic Metabolic Panel: Recent Labs  Lab 06/28/24 1352 06/29/24 0524  NA 131* 132*  K 4.4 4.2  CL 97* 100  CO2 23 23  GLUCOSE 108* 111*  BUN 23 13  CREATININE 1.09* 0.75  CALCIUM 9.6 9.1  MG 2.5*  --    GFR: Estimated Creatinine Clearance: 54.6 mL/min (by C-G formula based on SCr of 0.75 mg/dL). Liver Function Tests: Recent Labs  Lab 06/28/24 1352  AST 18  ALT 10  ALKPHOS 68  BILITOT 0.6  PROT 6.7  ALBUMIN 4.1   No results for input(s):  LIPASE, AMYLASE in the last 168 hours. No results for input(s): AMMONIA in the last 168 hours. Coagulation Profile: Recent Labs  Lab 06/28/24 1352  INR 1.0   Cardiac Enzymes: No results for input(s): CKTOTAL, CKMB, CKMBINDEX, TROPONINI in the last 168 hours. BNP (last 3 results) No results for input(s): PROBNP in the last 8760 hours. HbA1C: No results for input(s): HGBA1C in the last 72 hours. CBG: No results for input(s): GLUCAP in the last 168 hours. Lipid Profile: No results for input(s): CHOL, HDL, LDLCALC, TRIG, CHOLHDL, LDLDIRECT in the last 72 hours. Thyroid  Function Tests: No results for input(s): TSH, T4TOTAL, FREET4, T3FREE, THYROIDAB in the last 72 hours. Anemia Panel: No results for input(s): VITAMINB12, FOLATE, FERRITIN, TIBC, IRON, RETICCTPCT in the last 72 hours.  Sepsis Labs: No results for input(s): PROCALCITON, LATICACIDVEN in the last 168 hours.  Recent Results (from the past 240 hours)  Urine Culture     Status: None   Collection Time: 06/28/24  5:59 PM  Specimen: Urine, Clean Catch  Result Value Ref Range Status   Specimen Description   Final    URINE, CLEAN CATCH Performed at [facility], [address] [place], [place] [address]    Special Requests   Final    NONE Performed at [facility], [address] [place], [place] [address]    Culture   Final    NO GROWTH Performed at [facility], [address] [place], [place] [address]    Report Status 06/29/2024 FINAL  Final     Radiology Studies: No results found.   Scheduled Meds:  amLODipine   10 mg Oral Daily   cyanocobalamin  1,000 mcg Intramuscular Q48H   irbesartan   300 mg Oral Daily   pantoprazole (PROTONIX) IV  40 mg Intravenous Q12H   polyethylene glycol  17 g Oral Daily   pravastatin   40 mg Oral Daily   Continuous Infusions:     LOS: 0 days   Fredia Skeeter, MD [facility]  07/02/2024, 8:33 AM   *Please note that this is a verbal dictation therefore any spelling or grammatical errors are due to the Dragon Medical One system interpretation.  Please page via Amion and do not message via secure chat for urgent patient care matters. Secure chat can be used for non urgent patient care matters.  How to contact the [facility] Attending or Consulting provider 7A - 7P or covering provider during after hours 7P -7A, for this patient?  Check the care team in [facility] and look for a) attending/consulting [facility] provider listed and b) the [facility] team listed. Page or secure chat 7A-7P. Log into [email or web address] and use [place]'s universal password to access. If you do not have the password, please contact the hospital operator. Locate the [facility] provider you are looking for under [facility] and page to a number that you can be directly reached. If you still have difficulty reaching the provider, please page the [facility] (Director on Call) for the Hospitalists listed on amion for assistance.

## 2024-07-02 NOTE — Transfer of Care (Signed)
 Immediate Anesthesia Transfer of Care Note  Patient: Amy Andrade  Procedure(s) Performed: COLECTOMY, RIGHT, LAPAROSCOPIC  Patient Location: PACU  Anesthesia Type:General  Level of Consciousness: awake and alert   Airway & Oxygen Therapy: Patient Spontanous Breathing and Patient connected to nasal cannula oxygen  Post-op Assessment: Report given to RN and Post -op Vital signs reviewed and stable  Post vital signs: Reviewed and stable  Last Vitals:  Vitals Value Taken Time  BP    Temp    Pulse 71 07/02/24 16:50  Resp 10 07/02/24 16:50  SpO2 100 % 07/02/24 16:50  Vitals shown include unfiled device data.  Last Pain:  Vitals:   07/02/24 1240  TempSrc: Oral  PainSc: 0-No pain         Complications: No notable events documented.

## 2024-07-02 NOTE — Anesthesia Procedure Notes (Signed)
 Central Venous Catheter Insertion Performed by: Corinne Garnette BRAVO, MD, anesthesiologist Start/End10/16/2025 2:10 PM, 07/02/2024 2:20 PM Patient location: OR. Preanesthetic checklist: patient identified, IV checked, site marked, risks and benefits discussed, surgical consent, monitors and equipment checked, pre-op evaluation, timeout performed and anesthesia consent Position: Trendelenburg Lidocaine 1% used for infiltration and patient sedated Hand hygiene performed , maximum sterile barriers used  and Seldinger technique used Catheter size: 8 Fr Total catheter length 16. Central line was placed.Double lumen Procedure performed using ultrasound to evaluate access site. Ultrasound Notes:relevant anatomy identified, ultrasound used to visualize needle entry, vessel patent under ultrasound and image(s) printed for medical record. Attempts: 2 Following insertion, line sutured, dressing applied and Biopatch. Post procedure assessment: blood return through all ports, free fluid flow and no air  Patient tolerated the procedure well with no immediate complications.

## 2024-07-02 NOTE — Progress Notes (Addendum)
 1 Day Post-Op  Subjective: CC: Patients son at bedside.  Patient denies any abdominal pain, n/v. Tolerated CLD yesterday. Last BM yesterday. She is not sure if there was blood in her stool. On flowsheets BM was non-bloody.   Objective: Vital signs in last 24 hours: Temp:  [97.2 F (36.2 C)-98.5 F (36.9 C)] 98.4 F (36.9 C) (10/16 0506) Pulse Rate:  [64-81] 65 (10/16 0506) Resp:  [16-20] 20 (10/16 0506) BP: (105-183)/(39-74) 159/66 (10/16 0506) SpO2:  [96 %-100 %] 100 % (10/16 0506) Last BM Date : 07/01/24  Intake/Output from previous day: 10/15 0701 - 10/16 0700 In: 740 [P.O.:340; I.V.:400] Out: 1700 [Urine:1700] Intake/Output this shift: No intake/output data recorded.  PE: Gen:  Alert, NAD, pleasant Abd: Soft, ND, NT  Lab Results:  Recent Labs    07/01/24 0621 07/02/24 0536  WBC 6.8 7.9  HGB 8.7* 8.7*  HCT 31.1* 29.2*  PLT 413* 425*   BMET No results for input(s): NA, K, CL, CO2, GLUCOSE, BUN, CREATININE, CALCIUM in the last 72 hours. PT/INR No results for input(s): LABPROT, INR in the last 72 hours. CMP     Component Value Date/Time   NA 132 (L) 06/29/2024 0524   NA 136 (A) 10/20/2020 0000   K 4.2 06/29/2024 0524   CL 100 06/29/2024 0524   CO2 23 06/29/2024 0524   GLUCOSE 111 (H) 06/29/2024 0524   BUN 13 06/29/2024 0524   BUN 16 10/20/2020 0000   CREATININE 0.75 06/29/2024 0524   CREATININE 0.71 02/25/2024 0809   CALCIUM 9.1 06/29/2024 0524   PROT 6.7 06/28/2024 1352   ALBUMIN 4.1 06/28/2024 1352   AST 18 06/28/2024 1352   ALT 10 06/28/2024 1352   ALKPHOS 68 06/28/2024 1352   BILITOT 0.6 06/28/2024 1352   GFRNONAA >60 06/29/2024 0524   GFRNONAA 74 03/08/2021 0910   GFRAA 85 03/08/2021 0910   Lipase  No results found for: LIPASE  Studies/Results: No results found.  Anti-infectives: Anti-infectives (From admission, onward)    None        Assessment/Plan ENORA TRILLO is a 88 y.o. female with a history  of PE (2004) not on anticoagulation, HTN, HLD, asthma, Alzheimer disease who presented with fatigue and had unwitnessed falls at home with findings of H/H 6.9/24 (baseline 9.9/35.6 on 03/07/24) and fecal occult positive. S/p 1U PRBC on 10/12. EGD notable for Large 5 cm hiatal hernia with a couple of ? Cameron lesions and Colonoscopy notable for a non-obstructing partially circumferential proximal ascending colon mass, distal to the ICV with no bleeding was present.    Plan - Hgb stable at 8.7. AF HDS without tachycardia or hypotension. Last BM reported to be non-bloody.  - Path pending (ascending colon mass and mid transverse colon polyp)  - See note from 10/15. Discussed recommendation for right colectomy with patient and patients son, Shaeley Segall, who is reported to be her HCPOA. The patient and her son, Stormie Ventola, questions were answered to their satisfaction, they voiced understanding and elected to proceed with surgery.  - CT CAP pending - CEA pending - Please keep NPO for now for possible OR today, pending OR availability. If unable to accommodate OR today, will allow CLD today and plan gentle bowel prep with plans for surgery tomorrow.   FEN - NPO as above, BID PPI (for Cameron lesions on EGD), IVF per TRH VTE - SCDs, chem ppx on hold ID - None currently  I reviewed nursing notes, Consultant (Gastroenterology) notes,  hospitalist notes, last 24 h vitals and pain scores, last 48 h intake and output, last 24 h labs and trends, and last 24 h imaging results.    LOS: 0 days    Ozell CHRISTELLA Shaper, [facility] Surgery 07/02/2024, 9:04 AM Please see Amion for pager number during day hours 7:00am-4:30pm

## 2024-07-02 NOTE — Anesthesia Procedure Notes (Signed)
 Procedure Name: Intubation Date/Time: 07/02/2024 1:57 PM  Performed by: Carleton Garnette SAUNDERS, CRNAPre-anesthesia Checklist: Patient identified, Emergency Drugs available, Suction available, Patient being monitored and Timeout performed Patient Re-evaluated:Patient Re-evaluated prior to induction Oxygen Delivery Method: Circle system utilized Preoxygenation: Pre-oxygenation with 100% oxygen Induction Type: IV induction Ventilation: Mask ventilation without difficulty and Oral airway inserted - appropriate to patient size Laryngoscope Size: Mac and 4 Grade View: Grade II Tube type: Oral Tube size: 7.0 mm Number of attempts: 1 Airway Equipment and Method: Stylet Placement Confirmation: ETT inserted through vocal cords under direct vision, positive ETCO2 and breath sounds checked- equal and bilateral Secured at: 22 cm Tube secured with: Tape Dental Injury: Teeth and Oropharynx as per pre-operative assessment

## 2024-07-03 ENCOUNTER — Encounter (HOSPITAL_COMMUNITY): Payer: Self-pay | Admitting: General Surgery

## 2024-07-03 DIAGNOSIS — D649 Anemia, unspecified: Secondary | ICD-10-CM | POA: Diagnosis not present

## 2024-07-03 LAB — CBC
HCT: 29.1 % — ABNORMAL LOW (ref 36.0–46.0)
Hemoglobin: 8.4 g/dL — ABNORMAL LOW (ref 12.0–15.0)
MCH: 21.5 pg — ABNORMAL LOW (ref 26.0–34.0)
MCHC: 28.9 g/dL — ABNORMAL LOW (ref 30.0–36.0)
MCV: 74.4 fL — ABNORMAL LOW (ref 80.0–100.0)
Platelets: 357 K/uL (ref 150–400)
RBC: 3.91 MIL/uL (ref 3.87–5.11)
RDW: 18.5 % — ABNORMAL HIGH (ref 11.5–15.5)
WBC: 12.4 K/uL — ABNORMAL HIGH (ref 4.0–10.5)
nRBC: 0 % (ref 0.0–0.2)

## 2024-07-03 LAB — BASIC METABOLIC PANEL WITH GFR
Anion gap: 13 (ref 5–15)
BUN: 12 mg/dL (ref 8–23)
CO2: 22 mmol/L (ref 22–32)
Calcium: 8.9 mg/dL (ref 8.9–10.3)
Chloride: 97 mmol/L — ABNORMAL LOW (ref 98–111)
Creatinine, Ser: 1.07 mg/dL — ABNORMAL HIGH (ref 0.44–1.00)
GFR, Estimated: 50 mL/min — ABNORMAL LOW (ref >60)
Glucose, Bld: 134 mg/dL — ABNORMAL HIGH (ref 70–99)
Potassium: 4.3 mmol/L (ref 3.5–5.1)
Sodium: 131 mmol/L — ABNORMAL LOW (ref 135–145)

## 2024-07-03 LAB — CEA: CEA: 2.9 ng/mL (ref 0.0–4.7)

## 2024-07-03 MED ORDER — SODIUM CHLORIDE 0.9 % IV SOLN: INTRAVENOUS | Status: DC

## 2024-07-03 MED ORDER — CYANOCOBALAMIN 1000 MCG/ML IJ SOLN
1000.0000 ug | Freq: Every day | INTRAMUSCULAR | Status: AC
  Administered 2024-07-03 – 2024-07-05 (×3): 1000 ug via INTRAMUSCULAR
  Filled 2024-07-03 (×3): qty 1

## 2024-07-03 NOTE — Progress Notes (Signed)
 PROGRESS NOTE    DYNVER CLEMSON  FMW:[id number] DOB: 06-Oct-1935 DOA: 06/28/2024 PCP: [name], NP   Brief Narrative: 88 year old with past medical history significant for Alzheimer's dementia, hypertension, hyperlipidemia, chronic pain due to osteoarthritis of both shoulder, chronic left shoulder dislocation presents to the ED for evaluation of generalized weakness.  She recently completed treatment for Enterococcus faecalis UTI.  Evaluation in the ED hemoglobin was noted to be 6.9 FOBT positive.  CT cervical spine thoracic lumbar abdomen and pelvis no significant acute pathology.  Evaluated by GI, underwent endoscopy and colonoscopy and was found to have a nonobstructive colonic mass.  Subsequently evaluated by surgery and patient underwent right hemicolectomy.   Assessment & Plan:   Principal Problem:   Symptomatic anemia Active Problems:   Alzheimer disease (HCC)   Hyperlipidemia   Hypertension   Chronic hyponatremia   1-Iron deficiency anemia, B12 deficiency - Presented with generalized weakness and an hemoglobin of 6.9.   -She received 1 unit of packed red blood cell. - Started on B12 supplementation Monitor hb  Colonic mass -Patient presented with anemia underwent colonoscopy and was found to have nonobstructive ascending colonic mass. -Underwent laparoscopic assisted right colectomy 10/16 -Started on clear liquid today.  -IV fluids. Monitor oral intake.  - CEA 2.9 - Surgical pathology from colonoscopy pending - Hypertension -Continue amlodipine  and ibersartan  Hyperlipidemia -Continue pravastatin   Hyponatremia -Chronic, continue to monitor  Urinary urgency - Recently treated for Enterococcus faecalis UTI Repeated urine culture negative  Alzheimer's dementia - Delirium precaution  Mild renal insufficiency; monitor renal function.   Generalized weakness, in the setting of anemia and malignancy      Estimated body mass index is 25.31 kg/m as  calculated from the following:   Height as of this encounter: 5' 10 (1.778 m).   Weight as of this encounter: 80 kg.   DVT prophylaxis: SCD Code Status: Full code Family Communication:No family at bedside Disposition Plan:  Status is: Inpatient Remains inpatient appropriate because: management of anemia, colon mass    Consultants:  GI Surgery   Procedures:  Colonoscopy  Colectomy   Antimicrobials:    Subjective: She is alert, pleasantly confused.  Denies worsening abdominal pain.  She does not know if she is passing gas.  She did not know that she had surgery, she repeats the same questions over and over Objective: Vitals:   07/02/24 1930 07/02/24 2038 07/03/24 0100 07/03/24 0517  BP: (!) 150/66 (!) 148/66 (!) 143/61 (!) 137/58  Pulse: 91 81 70 65  Resp: 18 18 18 18   Temp: 98.5 F (36.9 C) (!) 97.5 F (36.4 C) 97.8 F (36.6 C) 97.9 F (36.6 C)  TempSrc: Oral Oral Oral Oral  SpO2: 100% 100% 99% 100%  Weight:      Height:        Intake/Output Summary (Last 24 hours) at 07/03/2024 0800 Last data filed at 07/03/2024 0400 Gross per 24 hour  Intake 2000 ml  Output 1750 ml  Net 250 ml   Filed Weights   06/28/24 2343 07/02/24 1240  Weight: 80 kg 80 kg    Examination:  General exam: Appears calm and comfortable  Respiratory system: Clear to auscultation. Respiratory effort normal. Cardiovascular system: S1 & S2 heard, RRR.  Gastrointestinal system: Abdomen is nondistended, soft and nontender, clean dressing mid abdomen Central nervous system: Alert confused   Data Reviewed: I have personally reviewed following labs and imaging studies  CBC: Recent Labs  Lab 06/28/24 1352 06/29/24 0524  06/30/24 0844 07/01/24 0621 07/02/24 0536  WBC 7.6 7.2 6.9 6.8 7.9  NEUTROABS 5.5  --  4.7 4.4 5.7  HGB 6.9* 8.2* 9.4* 8.7* 8.7*  HCT 24.0* 27.8* 32.5* 31.1* 29.2*  MCV 73.4* 73.2* 73.4* 74.9* 74.5*  PLT 471* 431* 462* 413* 425*   Basic Metabolic Panel: Recent  Labs  Lab 06/28/24 1352 06/29/24 0524 07/02/24 0536  NA 131* 132* 130*  K 4.4 4.2 3.8  CL 97* 100 96*  CO2 23 23 24   GLUCOSE 108* 111* 107*  BUN 23 13 <5*  CREATININE 1.09* 0.75 0.72  CALCIUM 9.6 9.1 9.4  MG 2.5*  --   --    GFR: Estimated Creatinine Clearance: 54.6 mL/min (by C-G formula based on SCr of 0.72 mg/dL). Liver Function Tests: Recent Labs  Lab 06/28/24 1352  AST 18  ALT 10  ALKPHOS 68  BILITOT 0.6  PROT 6.7  ALBUMIN 4.1   No results for input(s): LIPASE, AMYLASE in the last 168 hours. No results for input(s): AMMONIA in the last 168 hours. Coagulation Profile: Recent Labs  Lab 06/28/24 1352  INR 1.0   Cardiac Enzymes: No results for input(s): CKTOTAL, CKMB, CKMBINDEX, TROPONINI in the last 168 hours. BNP (last 3 results) No results for input(s): PROBNP in the last 8760 hours. HbA1C: No results for input(s): HGBA1C in the last 72 hours. CBG: No results for input(s): GLUCAP in the last 168 hours. Lipid Profile: No results for input(s): CHOL, HDL, LDLCALC, TRIG, CHOLHDL, LDLDIRECT in the last 72 hours. Thyroid  Function Tests: No results for input(s): TSH, T4TOTAL, FREET4, T3FREE, THYROIDAB in the last 72 hours. Anemia Panel: No results for input(s): VITAMINB12, FOLATE, FERRITIN, TIBC, IRON, RETICCTPCT in the last 72 hours. Sepsis Labs: No results for input(s): PROCALCITON, LATICACIDVEN in the last 168 hours.  Recent Results (from the past 240 hours)  Urine Culture     Status: None   Collection Time: 06/28/24  5:59 PM   Specimen: Urine, Clean Catch  Result Value Ref Range Status   Specimen Description   Final    URINE, CLEAN CATCH Performed at [facility], [address], [place], [place] [address]    Special Requests   Final    NONE Performed at [facility], [address], [place], [place] [address]    Culture   Final    NO GROWTH Performed at  [facility], [address], [place], [place] [address]    Report Status 06/29/2024 FINAL  Final         Radiology Studies: CT CHEST ABDOMEN PELVIS W CONTRAST Result Date: 07/02/2024 EXAM: CT CHEST, ABDOMEN AND PELVIS WITH CONTRAST 07/02/2024 11:22:16 AM TECHNIQUE: CT of the chest, abdomen and pelvis was performed with the administration of 100 mL (iohexol (OMNIPAQUE) 300 MG/ML solution 100 mL IOHEXOL 300 MG/ML SOLN) intravenous contrast. Multiplanar reformatted images are provided for review. Automated exposure control, iterative reconstruction, and/or weight based adjustment of the mA/kV was utilized to reduce the radiation dose to as low as reasonably achievable. COMPARISON: 06/28/2024 CLINICAL HISTORY: Metastatic disease evaluation. Per Dr.Pahwani notes:; Brief Narrative: ; HANAN MCWILLIAMS is a 88 y.o. female with medical history significant for Alzheimer's dementia, HTN, HLD, chronic pain due to OA of both shoulders and chronic left shoulder dislocation who presented to the ED for evaluation of generalized weakness and ; no other complaint. She notes frequent urinary urgency. She was recently treated for UTI initially with ciprofloxacin . Urine culture grew Enterococcus  faecalis and antibiotics were switched to nitrofurantoin  which she has since completed FINDINGS: CHEST: MEDIASTINUM AND LYMPH NODES: Heart and pericardium are unremarkable. The central airways are clear. No mediastinal, hilar or axillary lymphadenopathy. Mild thoracic aortic atherosclerosis. Mild coronary atherosclerosis of the LAD and right coronary artery. LUNGS AND PLEURA: No focal consolidation or pulmonary edema. No pleural effusion or pneumothorax. Mild scarring in the bilateral lower lobes. ABDOMEN AND PELVIS: LIVER: The liver is unremarkable. GALLBLADDER AND BILE DUCTS: Gallbladder is unremarkable. No biliary ductal dilatation. SPLEEN: No acute abnormality. PANCREAS: No acute abnormality. ADRENAL GLANDS: No acute  abnormality. KIDNEYS, URETERS AND BLADDER: No stones in the kidneys or ureters. No hydronephrosis. No perinephric or periureteral stranding. Urinary bladder is unremarkable. GI AND BOWEL: Stomach demonstrates no acute abnormality. There is no bowel obstruction. REPRODUCTIVE ORGANS: Status post hysterectomy with new fluid within the vaginal apex (image 101). PERITONEUM AND RETROPERITONEUM: No ascites. No free air. VASCULATURE: Aorta is normal in caliber. Atherosclerotic calcifications of the abdominal aorta and branch vessels, although patent. ABDOMINAL AND PELVIS LYMPH NODES: No lymphadenopathy. BONES AND SOFT TISSUES: Severe degenerative changes of the right shoulder. Mild degenerative changes of the left shoulder with anterior subluxation. Degenerative changes of the visualized thoracolumbar spine. Status post L4-L5 PLIF. S-shaped thoracolumbar scoliosis. No acute osseous abnormality. No focal soft tissue abnormality. IMPRESSION: 1. Fluid in the vaginal apex, new from recent prior. Consider GYN evaluation. 2. Otherwise unchanged from recent CT. Chronic ancillary findings as above. 3. No findings suspicious for primary malignancy or metastatic disease. Electronically signed by: Pinkie Pebbles MD 07/02/2024 08:23 PM EDT RP Workstation: [id number]        Scheduled Meds:  sodium chloride    Intravenous Once   amLODipine   10 mg Oral Daily   Chlorhexidine Gluconate Cloth  6 each Topical Daily   cyanocobalamin  1,000 mcg Intramuscular Q48H   irbesartan   300 mg Oral Daily   pantoprazole (PROTONIX) IV  40 mg Intravenous Q12H   polyethylene glycol  17 g Oral Daily   pravastatin   40 mg Oral Daily   sodium chloride  flush  10-40 mL Intracatheter Q12H   Continuous Infusions:   LOS: 1 day    Time spent: 35 minutes    Gaylyn Berish A Jashon Ishida, MD [facility]   If 7PM-7AM, please contact night-coverage [email or web address]  07/03/2024, 8:00 AM

## 2024-07-03 NOTE — Progress Notes (Signed)
 Physical Therapy Treatment Patient Details Name: Amy Andrade MRN: [id number] DOB: [birth year] Today's Date: 07/03/2024   History of Present Illness 88 yo females brought to ED 06/28/24 with weakness, anemia, FOBT, severe, B12 deficiency PMH includes Alzheimer dementia, asthma, chronic pain, HTN.chronic pain due to OA of both shoulders and chronic left shoulder dislocation.  1 unit PRBC on 10/12, EGD r cm hiatal hernia and lesions and colonoscopy 10/15 notable for non-obstructing colon mass, pt underwent laparoscopic assisted right colectomy on 07/02/2024    PT Comments   Pt admitted with above diagnosis.  Pt currently with functional limitations due to the deficits listed below (see PT Problem List). Pt in bed when PT arrived. Pt alert and communicative, pt however had demonstrated perceived behaviors overnight, none apparent during therapy intervention. Pt required min A for bed mobility with cues for log roll due to recent abdominal surgery, min A for sit to stand  from EOB, pt began to pivot to the R toward bathroom door for gait tasks and reported she could not do it and proceeded to exhibit strong posterior pushing response requiring mod A for balance cues for retrograde stepping pattern to recliner and pt unable to follow commands for proper UE or AD placement with transfer. Pt left seated in recliner, all needs in place. Pt will benefit from acute skilled PT to increase their independence and safety with mobility to allow discharge.       If plan is discharge home, recommend the following: A lot of help with walking and/or transfers;A little help with bathing/dressing/bathroom;Help with stairs or ramp for entrance;Assist for transportation;Direct supervision/assist for financial management   Can travel by private vehicle        Equipment Recommendations  Hospital bed    Recommendations for Other Services       Precautions / Restrictions Precautions Precautions:  Fall Precaution/Restrictions Comments: severe  bilateral shoulder AROM impaired, 0-60 degrees FF Restrictions Weight Bearing Restrictions Per Provider Order: No     Mobility  Bed Mobility Overal bed mobility: Needs Assistance Bed Mobility: Supine to Sit     Supine to sit: Min assist, HOB elevated     General bed mobility comments: tactle cues to initiate B LE to EOB and min A for trunk with cues for log roll technique for abdominal pain and recent surgery, HOB slightly elevated    Transfers Overall transfer level: Needs assistance Equipment used: Rolling walker (2 wheels) Transfers: Sit to/from Stand, Bed to chair/wheelchair/BSC Sit to Stand: Min assist   Step pivot transfers: Mod assist       General transfer comment: cues for extension posture, and anterior weigth shift with min A from EOB and pt progressed to mod A with pushing response to sit in recliner with limited recall for proper UE and AD placement will require reinforcement for safety with transfer tasks    Ambulation/Gait               General Gait Details: NT pt reported she could not do it following small pivoting task to turn toward bathroom door   Stairs             Wheelchair Mobility     Tilt Bed    Modified Rankin (Stroke Patients Only)       Balance Overall balance assessment: Needs assistance Sitting-balance support: Bilateral upper extremity supported, Feet supported Sitting balance-Leahy Scale: Good     Standing balance support: Bilateral upper extremity supported, During functional activity Standing balance-Leahy Scale: Poor  Communication Communication Communication: No apparent difficulties  Cognition Arousal: Alert Behavior During Therapy: WFL for tasks assessed/performed   PT - Cognitive impairments: History of cognitive impairments                         Following commands: Intact Following commands impaired:  Follows multi-step commands inconsistently    Cueing Cueing Techniques: Gestural cues, Tactile cues, Verbal cues  Exercises General Exercises - Lower Extremity Ankle Circles/Pumps: AROM, Both, Seated Long Arc Quad: AROM, Both, 10 reps, Seated Hip Flexion/Marching: AROM, Both, 10 reps, Seated    General Comments        Pertinent Vitals/Pain Pain Assessment Pain Assessment: Faces Faces Pain Scale: Hurts little more Breathing: normal Negative Vocalization: none Facial Expression: smiling or inexpressive Body Language: relaxed Consolability: no need to console PAINAD Score: 0 Pain Location: abdomen Pain Descriptors / Indicators: Aching, Constant, Discomfort, Dull, Grimacing Pain Intervention(s): Limited activity within patient's tolerance, Monitored during session    Home Living                          Prior Function            PT Goals (current goals can now be found in the care plan section) Acute Rehab PT Goals Patient Stated Goal: patient to be able to stand and transfer PT Goal Formulation: With family Time For Goal Achievement: 07/14/24 Potential to Achieve Goals: Good Progress towards PT goals: Progressing toward goals    Frequency    Min 3X/week      PT Plan      Co-evaluation              AM-PAC PT 6 Clicks Mobility   Outcome Measure  Help needed turning from your back to your side while in a flat bed without using bedrails?: A Little Help needed moving from lying on your back to sitting on the side of a flat bed without using bedrails?: A Lot Help needed moving to and from a bed to a chair (including a wheelchair)?: A Lot Help needed standing up from a chair using your arms (e.g., wheelchair or bedside chair)?: A Lot Help needed to walk in hospital room?: Total Help needed climbing 3-5 steps with a railing? : Total 6 Click Score: 11    End of Session Equipment Utilized During Treatment: Gait belt Activity Tolerance: No  increased pain;Patient limited by fatigue Patient left: in chair;with call bell/phone within reach;with chair alarm set Nurse Communication: Mobility status PT Visit Diagnosis: Unsteadiness on feet (R26.81)     Time: 8579-8556 PT Time Calculation (min) (ACUTE ONLY): 23 min  Charges:    $Therapeutic Activity: 23-37 mins PT General Charges $$ ACUTE PT VISIT: 1 Visit                     [place], PT Acute Rehab    [place] VEAR Drone 07/03/2024, 4:41 PM

## 2024-07-03 NOTE — Plan of Care (Signed)

## 2024-07-03 NOTE — Progress Notes (Signed)
 1 Day Post-Op  Subjective: CC: She became somewhat confused overnight and removed her dressing. This morning she is resting comfortably in bed. NAD. No reported BM or flatus.   Objective: Vital signs in last 24 hours: Temp:  [97.5 F (36.4 C)-98.5 F (36.9 C)] 97.9 F (36.6 C) (10/17 0517) Pulse Rate:  [65-91] 65 (10/17 0517) Resp:  [11-19] 18 (10/17 0517) BP: (137-169)/(58-85) 137/58 (10/17 0517) SpO2:  [95 %-100 %] 100 % (10/17 0517) Weight:  [80 kg] 80 kg (10/16 1240) Last BM Date : 07/01/24  Intake/Output from previous day: 10/16 0701 - 10/17 0700 In: 2000 [I.V.:2000] Out: 1750 [Urine:1600; Blood:150] Intake/Output this shift: No intake/output data recorded.  PE: Gen:  Alert, NAD, pleasant Abd: Soft, ND, incisional dressings clean and dry  Lab Results:  Recent Labs    07/01/24 0621 07/02/24 0536  WBC 6.8 7.9  HGB 8.7* 8.7*  HCT 31.1* 29.2*  PLT 413* 425*   BMET Recent Labs    07/02/24 0536  NA 130*  K 3.8  CL 96*  CO2 24  GLUCOSE 107*  BUN <5*  CREATININE 0.72  CALCIUM 9.4   PT/INR No results for input(s): LABPROT, INR in the last 72 hours. CMP     Component Value Date/Time   NA 130 (L) 07/02/2024 0536   NA 136 (A) 10/20/2020 0000   K 3.8 07/02/2024 0536   CL 96 (L) 07/02/2024 0536   CO2 24 07/02/2024 0536   GLUCOSE 107 (H) 07/02/2024 0536   BUN <5 (L) 07/02/2024 0536   BUN 16 10/20/2020 0000   CREATININE 0.72 07/02/2024 0536   CREATININE 0.71 02/25/2024 0809   CALCIUM 9.4 07/02/2024 0536   PROT 6.7 06/28/2024 1352   ALBUMIN 4.1 06/28/2024 1352   AST 18 06/28/2024 1352   ALT 10 06/28/2024 1352   ALKPHOS 68 06/28/2024 1352   BILITOT 0.6 06/28/2024 1352   GFRNONAA >60 07/02/2024 0536   GFRNONAA 74 03/08/2021 0910   GFRAA 85 03/08/2021 0910   Lipase  No results found for: LIPASE  Studies/Results: CT CHEST ABDOMEN PELVIS W CONTRAST Result Date: 07/02/2024 EXAM: CT CHEST, ABDOMEN AND PELVIS WITH CONTRAST 07/02/2024  11:22:16 AM TECHNIQUE: CT of the chest, abdomen and pelvis was performed with the administration of 100 mL (iohexol (OMNIPAQUE) 300 MG/ML solution 100 mL IOHEXOL 300 MG/ML SOLN) intravenous contrast. Multiplanar reformatted images are provided for review. Automated exposure control, iterative reconstruction, and/or weight based adjustment of the mA/kV was utilized to reduce the radiation dose to as low as reasonably achievable. COMPARISON: 06/28/2024 CLINICAL HISTORY: Metastatic disease evaluation. Per Dr.Pahwani notes:; Brief Narrative: ; Amy Andrade is a 88 y.o. female with medical history significant for Alzheimer's dementia, HTN, HLD, chronic pain due to OA of both shoulders and chronic left shoulder dislocation who presented to the ED for evaluation of generalized weakness and ; no other complaint. She notes frequent urinary urgency. She was recently treated for UTI initially with ciprofloxacin . Urine culture grew Enterococcus faecalis and antibiotics were switched to nitrofurantoin  which she has since completed FINDINGS: CHEST: MEDIASTINUM AND LYMPH NODES: Heart and pericardium are unremarkable. The central airways are clear. No mediastinal, hilar or axillary lymphadenopathy. Mild thoracic aortic atherosclerosis. Mild coronary atherosclerosis of the LAD and right coronary artery. LUNGS AND PLEURA: No focal consolidation or pulmonary edema. No pleural effusion or pneumothorax. Mild scarring in the bilateral lower lobes. ABDOMEN AND PELVIS: LIVER: The liver is unremarkable. GALLBLADDER AND BILE DUCTS: Gallbladder is unremarkable. No biliary ductal  dilatation. SPLEEN: No acute abnormality. PANCREAS: No acute abnormality. ADRENAL GLANDS: No acute abnormality. KIDNEYS, URETERS AND BLADDER: No stones in the kidneys or ureters. No hydronephrosis. No perinephric or periureteral stranding. Urinary bladder is unremarkable. GI AND BOWEL: Stomach demonstrates no acute abnormality. There is no bowel obstruction.  REPRODUCTIVE ORGANS: Status post hysterectomy with new fluid within the vaginal apex (image 101). PERITONEUM AND RETROPERITONEUM: No ascites. No free air. VASCULATURE: Aorta is normal in caliber. Atherosclerotic calcifications of the abdominal aorta and branch vessels, although patent. ABDOMINAL AND PELVIS LYMPH NODES: No lymphadenopathy. BONES AND SOFT TISSUES: Severe degenerative changes of the right shoulder. Mild degenerative changes of the left shoulder with anterior subluxation. Degenerative changes of the visualized thoracolumbar spine. Status post L4-L5 PLIF. S-shaped thoracolumbar scoliosis. No acute osseous abnormality. No focal soft tissue abnormality. IMPRESSION: 1. Fluid in the vaginal apex, new from recent prior. Consider GYN evaluation. 2. Otherwise unchanged from recent CT. Chronic ancillary findings as above. 3. No findings suspicious for primary malignancy or metastatic disease. Electronically signed by: Pinkie Pebbles MD 07/02/2024 08:23 PM EDT RP Workstation: [id number]    Anti-infectives: Anti-infectives (From admission, onward)    Start     Dose/Rate Route Frequency Ordered Stop   07/02/24 1130  cefoTEtan (CEFOTAN) 2 g in sodium chloride  0.9 % 100 mL IVPB        2 g 200 mL/hr over 30 Minutes Intravenous On call to O.R. 07/02/24 1033 07/02/24 1425        Assessment/Plan 88 y/o F w/ an ascending colon cancer s/p lap right colectomy 10/16  - CLD. ADAT - DC foley - AROBF - Labs pending. Will follow up CBC.  FEN - CLD, BID PPI (for Cameron lesions on EGD), IVF per [facility]H VTE - SCDs, okay to resume DVT ppx from surgery perspective ID - None currently  I reviewed nursing notes, Consultant (Gastroenterology) notes, hospitalist notes, last 24 h vitals and pain scores, last 48 h intake and output, last 24 h labs and trends, and last 24 h imaging results.    LOS: 1 day    Amy DELENA Idler, MD [facility] 07/03/2024, 9:53 AM Please see Amion for pager number  during day hours 7:00am-4:30pm

## 2024-07-04 DIAGNOSIS — D649 Anemia, unspecified: Secondary | ICD-10-CM | POA: Diagnosis not present

## 2024-07-04 LAB — CBC
HCT: 24.7 % — ABNORMAL LOW (ref 36.0–46.0)
HCT: 27.7 % — ABNORMAL LOW (ref 36.0–46.0)
Hemoglobin: 7.3 g/dL — ABNORMAL LOW (ref 12.0–15.0)
Hemoglobin: 7.9 g/dL — ABNORMAL LOW (ref 12.0–15.0)
MCH: 21.8 pg — ABNORMAL LOW (ref 26.0–34.0)
MCH: 21.2 pg — ABNORMAL LOW (ref 26.0–34.0)
MCHC: 29.6 g/dL — ABNORMAL LOW (ref 30.0–36.0)
MCHC: 28.5 g/dL — ABNORMAL LOW (ref 30.0–36.0)
MCV: 73.7 fL — ABNORMAL LOW (ref 80.0–100.0)
MCV: 74.3 fL — ABNORMAL LOW (ref 80.0–100.0)
Platelets: 283 K/uL (ref 150–400)
Platelets: 324 K/uL (ref 150–400)
RBC: 3.35 MIL/uL — ABNORMAL LOW (ref 3.87–5.11)
RBC: 3.73 MIL/uL — ABNORMAL LOW (ref 3.87–5.11)
RDW: 18.4 % — ABNORMAL HIGH (ref 11.5–15.5)
RDW: 18.3 % — ABNORMAL HIGH (ref 11.5–15.5)
WBC: 9.3 K/uL (ref 4.0–10.5)
WBC: 9 K/uL (ref 4.0–10.5)
nRBC: 0 % (ref 0.0–0.2)
nRBC: 0 % (ref 0.0–0.2)

## 2024-07-04 LAB — BASIC METABOLIC PANEL WITH GFR
Anion gap: 9 (ref 5–15)
BUN: 11 mg/dL (ref 8–23)
CO2: 23 mmol/L (ref 22–32)
Calcium: 8.4 mg/dL — ABNORMAL LOW (ref 8.9–10.3)
Chloride: 98 mmol/L (ref 98–111)
Creatinine, Ser: 0.84 mg/dL (ref 0.44–1.00)
GFR, Estimated: >60 mL/min (ref >60)
Glucose, Bld: 83 mg/dL (ref 70–99)
Potassium: 3.7 mmol/L (ref 3.5–5.1)
Sodium: 130 mmol/L — ABNORMAL LOW (ref 135–145)

## 2024-07-04 LAB — MAGNESIUM: Magnesium: 1.8 mg/dL (ref 1.7–2.4)

## 2024-07-04 MED ORDER — MAGNESIUM SULFATE 2 GM/50ML IV SOLN
2.0000 g | Freq: Once | INTRAVENOUS | Status: AC
  Administered 2024-07-04: 2 g via INTRAVENOUS
  Filled 2024-07-04: qty 50

## 2024-07-04 NOTE — Progress Notes (Signed)
 Mobility Specialist - Progress Note   07/04/24 0939  Therapy Vitals  Pulse Rate 72  BP (!) 148/54  Mobility  Activity Pivoted/transferred from bed to chair  Level of Assistance +2 (takes two people)  Assistive Device Stedy  Activity Response Tolerated fair  Mobility Referral Yes  Mobility visit 1 Mobility  Mobility Specialist Start Time (ACUTE ONLY) [id number]  Mobility Specialist Stop Time (ACUTE ONLY) 1005  Mobility Specialist Time Calculation (min) (ACUTE ONLY) 19 min   Received in bed and agreed to mobility. Min A for bed mobility. 2+ for stand in steady and transfer to chair. Left in chair with all needs met. Alarm on.  Cyndee Ada Mobility Specialist

## 2024-07-04 NOTE — Progress Notes (Signed)
 PROGRESS NOTE    Amy Andrade  FMW:[id number] DOB: October 11, 1935 DOA: 06/28/2024 PCP: [name], NP   Brief Narrative: 88 year old with past medical history significant for Alzheimer's dementia, hypertension, hyperlipidemia, chronic pain due to osteoarthritis of both shoulder, chronic left shoulder dislocation presents to the ED for evaluation of generalized weakness.  She recently completed treatment for Enterococcus faecalis UTI.  Evaluation in the ED hemoglobin was noted to be 6.9 FOBT positive.  CT cervical spine thoracic lumbar abdomen and pelvis no significant acute pathology.  Evaluated by GI, underwent endoscopy and colonoscopy and was found to have a nonobstructive colonic mass.  Subsequently evaluated by surgery and patient underwent right hemicolectomy.   Assessment & Plan:   Principal Problem:   Symptomatic anemia Active Problems:   Alzheimer disease (HCC)   Hyperlipidemia   Hypertension   Chronic hyponatremia   1-Iron deficiency Anemia, B12 deficiency Found to have colon Mass - Presented with generalized weakness and an hemoglobin of 6.9.   -She received 1 unit of packed red blood cell. - Started on B12 supplementation Monitor hb, down to 7.3 today,   Colonic mass, Adenocarcinoma.  -Patient presented with anemia underwent colonoscopy and was found to have nonobstructive ascending colonic mass. -Underwent laparoscopic assisted right colectomy 10/16 -Started on clear liquid today.  -IV fluids. Monitor oral intake.  - CEA 2.9 - Surgical pathology from colonoscopy Adenocarcinoma, moderately differentiated  -will need oncology referral.  - Hypertension -Continue amlodipine  and ibersartan  Hyperlipidemia -Continue pravastatin   Hyponatremia -Chronic, continue to monitor  Urinary urgency - Recently treated for Enterococcus faecalis UTI Repeated urine culture negative  Alzheimer's dementia - Delirium precaution  Mild renal insufficiency; monitor renal  function.   Generalized weakness, in the setting of anemia and malignancy      Estimated body mass index is 25.31 kg/m as calculated from the following:   Height as of this encounter: 5' 10 (1.778 m).   Weight as of this encounter: 80 kg.   DVT prophylaxis: SCD Code Status: Full code Family Communication:No family at bedside Disposition Plan:  Status is: Inpatient Remains inpatient appropriate because: management of anemia, colon mass    Consultants:  GI Surgery   Procedures:  Colonoscopy  Colectomy   Antimicrobials:    Subjective: No BM, tolerating clears. Denies worsening pain.   Objective: Vitals:   07/03/24 2047 07/04/24 0437 07/04/24 0939 07/04/24 1157  BP: (!) 145/57 (!) 159/59 (!) 148/54 130/60  Pulse: 78 68 72 65  Resp: 18 18  20   Temp: 98.7 F (37.1 C) 98.2 F (36.8 C)  98 F (36.7 C)  TempSrc: Oral Oral    SpO2: 95% 96%  94%  Weight:      Height:        Intake/Output Summary (Last 24 hours) at 07/04/2024 1344 Last data filed at 07/04/2024 9056 Gross per 24 hour  Intake 679.89 ml  Output 2150 ml  Net -1470.11 ml   Filed Weights   06/28/24 2343 07/02/24 1240  Weight: 80 kg 80 kg    Examination:  General exam: NAD Respiratory system: CTA Cardiovascular system: S 1, S 2 RRR Gastrointestinal system: BS present, soft, nt Central nervous system: Alert, pleasantly confuse   Data Reviewed: I have personally reviewed following labs and imaging studies  CBC: Recent Labs  Lab 06/28/24 1352 06/29/24 0524 06/30/24 0844 07/01/24 0621 07/02/24 0536 07/03/24 1515 07/04/24 0339  WBC 7.6   < > 6.9 6.8 7.9 12.4* 9.3  NEUTROABS 5.5  --  4.7 4.4 5.7  --   --   HGB 6.9*   < > 9.4* 8.7* 8.7* 8.4* 7.3*  HCT 24.0*   < > 32.5* 31.1* 29.2* 29.1* 24.7*  MCV 73.4*   < > 73.4* 74.9* 74.5* 74.4* 73.7*  PLT 471*   < > 462* 413* 425* 357 283   < > = values in this interval not displayed.   Basic Metabolic Panel: Recent Labs  Lab 06/28/24 1352  06/29/24 0524 07/02/24 0536 07/03/24 1515 07/04/24 0339  NA 131* 132* 130* 131* 130*  K 4.4 4.2 3.8 4.3 3.7  CL 97* 100 96* 97* 98  CO2 23 23 24 22 23   GLUCOSE 108* 111* 107* 134* 83  BUN 23 13 <5* 12 11  CREATININE 1.09* 0.75 0.72 1.07* 0.84  CALCIUM 9.6 9.1 9.4 8.9 8.4*  MG 2.5*  --   --   --  1.8   GFR: Estimated Creatinine Clearance: 52 mL/min (by C-G formula based on SCr of 0.84 mg/dL). Liver Function Tests: Recent Labs  Lab 06/28/24 1352  AST 18  ALT 10  ALKPHOS 68  BILITOT 0.6  PROT 6.7  ALBUMIN 4.1   No results for input(s): LIPASE, AMYLASE in the last 168 hours. No results for input(s): AMMONIA in the last 168 hours. Coagulation Profile: Recent Labs  Lab 06/28/24 1352  INR 1.0   Cardiac Enzymes: No results for input(s): CKTOTAL, CKMB, CKMBINDEX, TROPONINI in the last 168 hours. BNP (last 3 results) No results for input(s): PROBNP in the last 8760 hours. HbA1C: No results for input(s): HGBA1C in the last 72 hours. CBG: No results for input(s): GLUCAP in the last 168 hours. Lipid Profile: No results for input(s): CHOL, HDL, LDLCALC, TRIG, CHOLHDL, LDLDIRECT in the last 72 hours. Thyroid  Function Tests: No results for input(s): TSH, T4TOTAL, FREET4, T3FREE, THYROIDAB in the last 72 hours. Anemia Panel: No results for input(s): VITAMINB12, FOLATE, FERRITIN, TIBC, IRON, RETICCTPCT in the last 72 hours. Sepsis Labs: No results for input(s): PROCALCITON, LATICACIDVEN in the last 168 hours.  Recent Results (from the past 240 hours)  Urine Culture     Status: None   Collection Time: 06/28/24  5:59 PM   Specimen: Urine, Clean Catch  Result Value Ref Range Status   Specimen Description   Final    URINE, CLEAN CATCH Performed at [facility], [address], [place], [place] [address]    Special Requests   Final    NONE Performed at [facility], [address], [place], [place] [address]    Culture   Final    NO GROWTH Performed at [facility], [address] [place], [place] [address]    Report Status 06/29/2024 FINAL  Final         Radiology Studies: No results found.       Scheduled Meds:  sodium chloride    Intravenous Once   amLODipine   10 mg Oral Daily   Chlorhexidine Gluconate Cloth  6 each Topical Daily   cyanocobalamin  1,000 mcg Intramuscular Daily   irbesartan   300 mg Oral Daily   pantoprazole (PROTONIX) IV  40 mg Intravenous Q12H   polyethylene glycol  17 g Oral Daily   pravastatin   40 mg Oral Daily   sodium chloride  flush  10-40 mL Intracatheter Q12H   Continuous Infusions:  sodium chloride  75 mL/hr at 07/03/24 1302     LOS: 2 days    Time spent: 35 minutes  Owen DELENA Lore, MD [facility]   If 7PM-7AM, please contact night-coverage [email or web address]  07/04/2024, 1:44 PM

## 2024-07-04 NOTE — Progress Notes (Signed)
 Mobility Specialist - Progress Note   07/04/24 1400  Mobility  Activity Pivoted/transferred from chair to bed  Level of Assistance +2 (takes two people)  Assistive Device Stedy  Activity Response Tolerated fair  Mobility Referral Yes  Mobility visit 1 Mobility  Mobility Specialist Start Time (ACUTE ONLY) 1440  Mobility Specialist Stop Time (ACUTE ONLY) 1453  Mobility Specialist Time Calculation (min) (ACUTE ONLY) 13 min   Received in chair and agreed to mobility. Using stedy Min A of two. No issues and returned to bed with all needs met, RN in room.  Cyndee Ada Mobility Specialist

## 2024-07-04 NOTE — Progress Notes (Signed)
 2 Days Post-Op   Subjective/Chief Complaint: No complaints, unsure if bowel function,  tol clears   Objective: Vital signs in last 24 hours: Temp:  [98 F (36.7 C)-98.7 F (37.1 C)] 98.2 F (36.8 C) (10/18 0437) Pulse Rate:  [67-78] 68 (10/18 0437) Resp:  [16-18] 18 (10/18 0437) BP: (137-159)/(51-59) 159/59 (10/18 0437) SpO2:  [95 %-100 %] 96 % (10/18 0437) Last BM Date : 07/01/24  Intake/Output from previous day: 10/17 0701 - 10/18 0700 In: 69.9 [I.V.:69.9] Out: 1250 [Urine:1250] Intake/Output this shift: No intake/output data recorded.  General nad in bed Ab soft approp tender dressings dry  Lab Results:  Recent Labs    07/03/24 1515 07/04/24 0339  WBC 12.4* 9.3  HGB 8.4* 7.3*  HCT 29.1* 24.7*  PLT 357 283   BMET Recent Labs    07/03/24 1515 07/04/24 0339  NA 131* 130*  K 4.3 3.7  CL 97* 98  CO2 22 23  GLUCOSE 134* 83  BUN 12 11  CREATININE 1.07* 0.84  CALCIUM 8.9 8.4*   PT/INR No results for input(s): LABPROT, INR in the last 72 hours. ABG No results for input(s): PHART, HCO3 in the last 72 hours.  Invalid input(s): PCO2, PO2  Studies/Results: CT CHEST ABDOMEN PELVIS W CONTRAST Result Date: 07/02/2024 EXAM: CT CHEST, ABDOMEN AND PELVIS WITH CONTRAST 07/02/2024 11:22:16 AM TECHNIQUE: CT of the chest, abdomen and pelvis was performed with the administration of 100 mL (iohexol (OMNIPAQUE) 300 MG/ML solution 100 mL IOHEXOL 300 MG/ML SOLN) intravenous contrast. Multiplanar reformatted images are provided for review. Automated exposure control, iterative reconstruction, and/or weight based adjustment of the mA/kV was utilized to reduce the radiation dose to as low as reasonably achievable. COMPARISON: 06/28/2024 CLINICAL HISTORY: Metastatic disease evaluation. Per Dr.Pahwani notes:; Brief Narrative: ; Amy Andrade is a 88 y.o. female with medical history significant for Alzheimer's dementia, HTN, HLD, chronic pain due to OA of both shoulders  and chronic left shoulder dislocation who presented to the ED for evaluation of generalized weakness and ; no other complaint. She notes frequent urinary urgency. She was recently treated for UTI initially with ciprofloxacin . Urine culture grew Enterococcus faecalis and antibiotics were switched to nitrofurantoin  which she has since completed FINDINGS: CHEST: MEDIASTINUM AND LYMPH NODES: Heart and pericardium are unremarkable. The central airways are clear. No mediastinal, hilar or axillary lymphadenopathy. Mild thoracic aortic atherosclerosis. Mild coronary atherosclerosis of the LAD and right coronary artery. LUNGS AND PLEURA: No focal consolidation or pulmonary edema. No pleural effusion or pneumothorax. Mild scarring in the bilateral lower lobes. ABDOMEN AND PELVIS: LIVER: The liver is unremarkable. GALLBLADDER AND BILE DUCTS: Gallbladder is unremarkable. No biliary ductal dilatation. SPLEEN: No acute abnormality. PANCREAS: No acute abnormality. ADRENAL GLANDS: No acute abnormality. KIDNEYS, URETERS AND BLADDER: No stones in the kidneys or ureters. No hydronephrosis. No perinephric or periureteral stranding. Urinary bladder is unremarkable. GI AND BOWEL: Stomach demonstrates no acute abnormality. There is no bowel obstruction. REPRODUCTIVE ORGANS: Status post hysterectomy with new fluid within the vaginal apex (image 101). PERITONEUM AND RETROPERITONEUM: No ascites. No free air. VASCULATURE: Aorta is normal in caliber. Atherosclerotic calcifications of the abdominal aorta and branch vessels, although patent. ABDOMINAL AND PELVIS LYMPH NODES: No lymphadenopathy. BONES AND SOFT TISSUES: Severe degenerative changes of the right shoulder. Mild degenerative changes of the left shoulder with anterior subluxation. Degenerative changes of the visualized thoracolumbar spine. Status post L4-L5 PLIF. S-shaped thoracolumbar scoliosis. No acute osseous abnormality. No focal soft tissue abnormality. IMPRESSION: 1. Fluid in  the vaginal apex, new from recent prior. Consider GYN evaluation. 2. Otherwise unchanged from recent CT. Chronic ancillary findings as above. 3. No findings suspicious for primary malignancy or metastatic disease. Electronically signed by: Pinkie Pebbles MD 07/02/2024 08:23 PM EDT RP Workstation: [id number]    Anti-infectives: Anti-infectives (From admission, onward)    Start     Dose/Rate Route Frequency Ordered Stop   07/02/24 1130  cefoTEtan (CEFOTAN) 2 g in sodium chloride  0.9 % 100 mL IVPB        2 g 200 mL/hr over 30 Minutes Intravenous On call to O.R. 07/02/24 1033 07/02/24 1425       Assessment/Plan: POD 2 lap right colectomy -continue clears until some bowel function -hct down some, hold pharm dvt prophylaxis for now, no evidence bleeding, recheck later today and in am -oob, pulm toilet as much as possible  FEN: clears, ivf per [facility] VTE: scds, hold on lovenox     [name] 07/04/2024

## 2024-07-05 DIAGNOSIS — D649 Anemia, unspecified: Secondary | ICD-10-CM | POA: Diagnosis not present

## 2024-07-05 LAB — CBC
HCT: 25.6 % — ABNORMAL LOW (ref 36.0–46.0)
Hemoglobin: 7.5 g/dL — ABNORMAL LOW (ref 12.0–15.0)
MCH: 21.4 pg — ABNORMAL LOW (ref 26.0–34.0)
MCHC: 29.3 g/dL — ABNORMAL LOW (ref 30.0–36.0)
MCV: 73.1 fL — ABNORMAL LOW (ref 80.0–100.0)
Platelets: 312 K/uL (ref 150–400)
RBC: 3.5 MIL/uL — ABNORMAL LOW (ref 3.87–5.11)
RDW: 18.5 % — ABNORMAL HIGH (ref 11.5–15.5)
WBC: 7.6 K/uL (ref 4.0–10.5)
nRBC: 0 % (ref 0.0–0.2)

## 2024-07-05 LAB — BASIC METABOLIC PANEL WITH GFR
Anion gap: 8 (ref 5–15)
BUN: <5 mg/dL — ABNORMAL LOW (ref 8–23)
CO2: 23 mmol/L (ref 22–32)
Calcium: 8.5 mg/dL — ABNORMAL LOW (ref 8.9–10.3)
Chloride: 101 mmol/L (ref 98–111)
Creatinine, Ser: 0.55 mg/dL (ref 0.44–1.00)
GFR, Estimated: >60 mL/min (ref >60)
Glucose, Bld: 109 mg/dL — ABNORMAL HIGH (ref 70–99)
Potassium: 3.8 mmol/L (ref 3.5–5.1)
Sodium: 133 mmol/L — ABNORMAL LOW (ref 135–145)

## 2024-07-05 MED ORDER — PANTOPRAZOLE SODIUM 40 MG PO TBEC
40.0000 mg | DELAYED_RELEASE_TABLET | Freq: Two times a day (BID) | ORAL | Status: DC
Start: 2024-07-05 — End: 2024-07-10
  Administered 2024-07-05 – 2024-07-10 (×11): 40 mg via ORAL
  Filled 2024-07-05 (×10): qty 1

## 2024-07-05 MED ORDER — BOOST / RESOURCE BREEZE PO LIQD CUSTOM
1.0000 | Freq: Three times a day (TID) | ORAL | Status: DC
Start: 2024-07-05 — End: 2024-07-10
  Administered 2024-07-05 – 2024-07-08 (×10): 1 via ORAL

## 2024-07-05 MED ORDER — ORAL CARE MOUTH RINSE: 15.0000 mL | OROMUCOSAL | Status: DC | PRN

## 2024-07-05 MED ORDER — IRON SUCROSE 200 MG IVPB - SIMPLE MED
200.0000 mg | Freq: Once | Status: AC
  Administered 2024-07-05: 200 mg via INTRAVENOUS
  Filled 2024-07-05: qty 200

## 2024-07-05 MED ORDER — MELATONIN 5 MG PO TABS
5.0000 mg | ORAL_TABLET | Freq: Once | ORAL | Status: AC
  Administered 2024-07-05: 5 mg via ORAL
  Filled 2024-07-05: qty 1

## 2024-07-05 NOTE — Plan of Care (Signed)

## 2024-07-05 NOTE — Progress Notes (Signed)
 Mobility Specialist - Progress Note   07/05/24 1400  Mobility  Activity Pivoted/transferred from chair to bed  Level of Assistance +2 (takes two people)  Assistive Device [employer]/Exercises Active  Activity Response Tolerated well  Mobility Referral Yes  Mobility visit 1 Mobility  Mobility Specialist Start Time (ACUTE ONLY) 1418  Mobility Specialist Stop Time (ACUTE ONLY) 1433  Mobility Specialist Time Calculation (min) (ACUTE ONLY) 15 min   Received in chair and agreed to mobility. Min A of two to sit to stand in stedy. Returned to bed with all needs met.  Cyndee Ada Mobility Specialist

## 2024-07-05 NOTE — Plan of Care (Signed)

## 2024-07-05 NOTE — Progress Notes (Signed)
 PROGRESS NOTE    Amy Andrade  FMW:[id number] DOB: 23-Jan-1936 DOA: 06/28/2024 PCP: [name], NP   Brief Narrative: 88 year old with past medical history significant for Alzheimer's dementia, hypertension, hyperlipidemia, chronic pain due to osteoarthritis of both shoulder, chronic left shoulder dislocation presents to the ED for evaluation of generalized weakness.  She recently completed treatment for Enterococcus faecalis UTI.  Evaluation in the ED hemoglobin was noted to be 6.9 FOBT positive.  CT cervical spine thoracic lumbar abdomen and pelvis no significant acute pathology.  Evaluated by GI, underwent endoscopy and colonoscopy and was found to have a nonobstructive colonic mass.  Subsequently evaluated by surgery and patient underwent right hemicolectomy.   Assessment & Plan:   Principal Problem:   Symptomatic anemia Active Problems:   Alzheimer disease (HCC)   Hyperlipidemia   Hypertension   Chronic hyponatremia   1-Iron deficiency Anemia, B12 deficiency Found to have colon Mass - Presented with generalized weakness and an hemoglobin of 6.9.   -She received 1 unit of packed red blood cell. - Started on B12 supplementation Monitor hb, down to 7.5 Will give IV iron.   Colonic mass, Adenocarcinoma.  -Patient presented with anemia underwent colonoscopy and was found to have nonobstructive ascending colonic mass. -Underwent laparoscopic assisted right colectomy 10/16 -Started on clear liquid today.  -IV fluids. Monitor oral intake.  - CEA 2.9 - Surgical pathology from colonoscopy Adenocarcinoma, moderately differentiated  -will need oncology referral.  - Hypertension -Continue amlodipine  and ibersartan  Hyperlipidemia -Continue pravastatin   Hyponatremia -Chronic, continue to monitor  Urinary urgency - Recently treated for Enterococcus faecalis UTI Repeated urine culture negative  Alzheimer's dementia - Delirium precaution  Mild renal insufficiency;  monitor renal function.   Generalized weakness, in the setting of anemia and malignancy      Estimated body mass index is 25.08 kg/m as calculated from the following:   Height as of this encounter: 5' 10 (1.778 m).   Weight as of this encounter: 79.3 kg.   DVT prophylaxis: SCD Code Status: Full code Family Communication:No family at bedside Disposition Plan:  Status is: Inpatient Remains inpatient appropriate because: management of anemia, colon mass    Consultants:  GI Surgery   Procedures:  Colonoscopy  Colectomy   Antimicrobials:    Subjective: No complaints, tolerating clear liquid. No BM  Objective: Vitals:   07/04/24 0939 07/04/24 1157 07/04/24 1947 07/05/24 0542  BP: (!) 148/54 130/60 (!) 148/62 (!) 169/69  Pulse: 72 65 72 75  Resp:  20 18 18   Temp:  98 F (36.7 C) 98 F (36.7 C) 97.7 F (36.5 C)  TempSrc:   Oral Oral  SpO2:  94% 98% 97%  Weight:    79.3 kg  Height:        Intake/Output Summary (Last 24 hours) at 07/05/2024 0749 Last data filed at 07/05/2024 0700 Gross per 24 hour  Intake 5036.43 ml  Output 2950 ml  Net 2086.43 ml   Filed Weights   06/28/24 2343 07/02/24 1240 07/05/24 0542  Weight: 80 kg 80 kg 79.3 kg    Examination:  General exam: NAD Respiratory system: CTA Cardiovascular system: S 1, S 2 RRR Gastrointestinal system: BS present, soft, nt Central nervous system: alert, confuse   Data Reviewed: I have personally reviewed following labs and imaging studies  CBC: Recent Labs  Lab 06/28/24 1352 06/29/24 0524 06/30/24 0844 07/01/24 0621 07/02/24 0536 07/03/24 1515 07/04/24 0339 07/04/24 1800 07/05/24 0248  WBC 7.6   < > 6.9  6.8 7.9 12.4* 9.3 9.0 7.6  NEUTROABS 5.5  --  4.7 4.4 5.7  --   --   --   --   HGB 6.9*   < > 9.4* 8.7* 8.7* 8.4* 7.3* 7.9* 7.5*  HCT 24.0*   < > 32.5* 31.1* 29.2* 29.1* 24.7* 27.7* 25.6*  MCV 73.4*   < > 73.4* 74.9* 74.5* 74.4* 73.7* 74.3* 73.1*  PLT 471*   < > 462* 413* 425* 357 283  324 312   < > = values in this interval not displayed.   Basic Metabolic Panel: Recent Labs  Lab 06/28/24 1352 06/29/24 0524 07/02/24 0536 07/03/24 1515 07/04/24 0339 07/05/24 0248  NA 131* 132* 130* 131* 130* 133*  K 4.4 4.2 3.8 4.3 3.7 3.8  CL 97* 100 96* 97* 98 101  CO2 23 23 24 22 23 23   GLUCOSE 108* 111* 107* 134* 83 109*  BUN 23 13 <5* 12 11 <5*  CREATININE 1.09* 0.75 0.72 1.07* 0.84 0.55  CALCIUM 9.6 9.1 9.4 8.9 8.4* 8.5*  MG 2.5*  --   --   --  1.8  --    GFR: Estimated Creatinine Clearance: 54.6 mL/min (by C-G formula based on SCr of 0.55 mg/dL). Liver Function Tests: Recent Labs  Lab 06/28/24 1352  AST 18  ALT 10  ALKPHOS 68  BILITOT 0.6  PROT 6.7  ALBUMIN 4.1   No results for input(s): LIPASE, AMYLASE in the last 168 hours. No results for input(s): AMMONIA in the last 168 hours. Coagulation Profile: Recent Labs  Lab 06/28/24 1352  INR 1.0   Cardiac Enzymes: No results for input(s): CKTOTAL, CKMB, CKMBINDEX, TROPONINI in the last 168 hours. BNP (last 3 results) No results for input(s): PROBNP in the last 8760 hours. HbA1C: No results for input(s): HGBA1C in the last 72 hours. CBG: No results for input(s): GLUCAP in the last 168 hours. Lipid Profile: No results for input(s): CHOL, HDL, LDLCALC, TRIG, CHOLHDL, LDLDIRECT in the last 72 hours. Thyroid  Function Tests: No results for input(s): TSH, T4TOTAL, FREET4, T3FREE, THYROIDAB in the last 72 hours. Anemia Panel: No results for input(s): VITAMINB12, FOLATE, FERRITIN, TIBC, IRON, RETICCTPCT in the last 72 hours. Sepsis Labs: No results for input(s): PROCALCITON, LATICACIDVEN in the last 168 hours.  Recent Results (from the past 240 hours)  Urine Culture     Status: None   Collection Time: 06/28/24  5:59 PM   Specimen: Urine, Clean Catch  Result Value Ref Range Status   Specimen Description   Final    URINE, CLEAN CATCH Performed at  [facility], [address], [place], [place] [address]    Special Requests   Final    NONE Performed at [facility], [address] [place], [place] [address]    Culture   Final    NO GROWTH Performed at [facility], [address] [place], [place] [address]    Report Status 06/29/2024 FINAL  Final         Radiology Studies: No results found.       Scheduled Meds:  sodium chloride    Intravenous Once   amLODipine   10 mg Oral Daily   Chlorhexidine Gluconate Cloth  6 each Topical Daily   cyanocobalamin  1,000 mcg Intramuscular Daily   irbesartan   300 mg Oral Daily   pantoprazole (PROTONIX) IV  40 mg Intravenous Q12H   polyethylene glycol  17 g Oral Daily   pravastatin   40  mg Oral Daily   sodium chloride  flush  10-40 mL Intracatheter Q12H   Continuous Infusions:     LOS: 3 days    Time spent: 35 minutes    Loriel Diehl A Jamarr Treinen, MD [facility]   If 7PM-7AM, please contact night-coverage [email or web address]  07/05/2024, 7:49 AM

## 2024-07-05 NOTE — Progress Notes (Signed)
 Mobility Specialist - Progress Note   07/05/24 1032  Mobility  Activity Pivoted/transferred from bed to chair  Level of Assistance +2 (takes two people)  Assistive Device [employer]/Exercises Active  Activity Response Tolerated well  Mobility Referral Yes  Mobility visit 1 Mobility  Mobility Specialist Start Time (ACUTE ONLY) 1007  Mobility Specialist Stop Time (ACUTE ONLY) 1023  Mobility Specialist Time Calculation (min) (ACUTE ONLY) 16 min   Received in bed and agreed to mobility, Mod A for bed mobility, doesn't follow commands well for log roll. Min A of 2 for sit to stand. Stedy transfer to chair with all needs met.  Cyndee Ada Mobility Specialist

## 2024-07-05 NOTE — Progress Notes (Signed)
 3 Days Post-Op   Subjective/Chief Complaint: Did get oob yesterday, not sure if bowel function, son not in room this am, tol liquids   Objective: Vital signs in last 24 hours: Temp:  [97.7 F (36.5 C)-98 F (36.7 C)] 97.7 F (36.5 C) (10/19 0542) Pulse Rate:  [65-75] 75 (10/19 0542) Resp:  [18-20] 18 (10/19 0542) BP: (130-169)/(54-69) 169/69 (10/19 0542) SpO2:  [94 %-98 %] 97 % (10/19 0542) Weight:  [79.3 kg] 79.3 kg (10/19 0542) Last BM Date : 07/01/24  Intake/Output from previous day: 10/18 0701 - 10/19 0700 In: 5036.4 [P.O.:2150; I.V.:2886.4] Out: 2950 [Urine:2950] Intake/Output this shift: No intake/output data recorded.  Ab soft approp tender nondistended dressings dry  Lab Results:  Recent Labs    07/04/24 1800 07/05/24 0248  WBC 9.0 7.6  HGB 7.9* 7.5*  HCT 27.7* 25.6*  PLT 324 312   BMET Recent Labs    07/04/24 0339 07/05/24 0248  NA 130* 133*  K 3.7 3.8  CL 98 101  CO2 23 23  GLUCOSE 83 109*  BUN 11 <5*  CREATININE 0.84 0.55  CALCIUM 8.4* 8.5*   PT/INR No results for input(s): LABPROT, INR in the last 72 hours. ABG No results for input(s): PHART, HCO3 in the last 72 hours.  Invalid input(s): PCO2, PO2  Studies/Results: No results found.  Anti-infectives: Anti-infectives (From admission, onward)    Start     Dose/Rate Route Frequency Ordered Stop   07/02/24 1130  cefoTEtan (CEFOTAN) 2 g in sodium chloride  0.9 % 100 mL IVPB        2 g 200 mL/hr over 30 Minutes Intravenous On call to O.R. 07/02/24 1033 07/02/24 1425       Assessment/Plan: POD 3 lap right colectomy -not sure if bowel function but soft and tol clears, will do fulls today -hb basically stable, no evidence bleeding, I am fine with pharm dvt prophylaxis and recheck in am -oob, pulm toilet as much as possible   FEN: fulls, ivf per TRH VTE: scds Path pending    [name] 07/05/2024

## 2024-07-06 ENCOUNTER — Telehealth: Payer: Self-pay | Admitting: *Deleted

## 2024-07-06 DIAGNOSIS — D649 Anemia, unspecified: Secondary | ICD-10-CM | POA: Diagnosis not present

## 2024-07-06 LAB — SURGICAL PATHOLOGY

## 2024-07-06 LAB — CBC
HCT: 27 % — ABNORMAL LOW (ref 36.0–46.0)
Hemoglobin: 8.2 g/dL — ABNORMAL LOW (ref 12.0–15.0)
MCH: 22.3 pg — ABNORMAL LOW (ref 26.0–34.0)
MCHC: 30.4 g/dL (ref 30.0–36.0)
MCV: 73.4 fL — ABNORMAL LOW (ref 80.0–100.0)
Platelets: 298 K/uL (ref 150–400)
RBC: 3.68 MIL/uL — ABNORMAL LOW (ref 3.87–5.11)
RDW: 18.5 % — ABNORMAL HIGH (ref 11.5–15.5)
WBC: 6.8 K/uL (ref 4.0–10.5)
nRBC: 0 % (ref 0.0–0.2)

## 2024-07-06 NOTE — TOC Progression Note (Addendum)
 Transition of Care [facility]) - Progression Note    Patient Details  Name: Amy Andrade MRN: [id number] Date of Birth: 23-Dec-1935  [facility] of [facility]) CM/SW Contact  Alfonse JONELLE Rex, RN Phone Number: 07/06/2024, 12:25 PM  Clinical Narrative:   MD order for home hospital bed, referral previously sent to [employer]. Call to [employer], rep-Jermaine, informed MD order for hospital bed is in, states he will contact patient's son Rip) to arrange deliver of hospital bed.     Expected Discharge Plan: Home w Home Health Services Barriers to Discharge: Continued Medical Work up               Expected Discharge Plan and Services   Discharge Planning Services: CM Consult Post Acute Care Choice: Home Health, Durable Medical Equipment                   DME Arranged: Hospital bed DME Agency: [employer] Date DME Agency Contacted: 07/01/24     HH Arranged: PT, OT           Social Drivers of Health (SDOH) Interventions SDOH Screenings   Food Insecurity: No Food Insecurity (06/28/2024)  Housing: Low Risk  (06/28/2024)  Transportation Needs: No Transportation Needs (06/28/2024)  Utilities: Not At Risk (06/28/2024)  Depression (PHQ2-9): Low Risk  (05/21/2024)  Financial Resource Strain: Low Risk  (11/18/2017)  Physical Activity: Inactive (11/18/2017)  Social Connections: Socially Isolated (06/28/2024)  Stress: No Stress Concern Present (11/18/2017)  Tobacco Use: Medium Risk (07/02/2024)    Readmission Risk Interventions     No data to display

## 2024-07-06 NOTE — Progress Notes (Signed)
 Occupational Therapy Treatment Patient Details Name: Amy Andrade MRN: [id number] DOB: 1936/07/18 Today's Date: 07/06/2024   History of present illness 88 yo females brought to ED 06/28/24 with weakness, anemia, FOBT, severe, B12 deficiency PMH includes Alzheimer dementia, asthma, chronic pain, HTN.chronic pain due to OA of both shoulders and chronic left shoulder dislocation.  1 unit PRBC on 10/12, EGD r cm hiatal hernia and lesions and colonoscopy 10/15 notable for non-obstructing colon mass, pt underwent laparoscopic assisted right colectomy on 07/02/2024   OT comments  Pt making slow progress with functional goals. Pt limited by increased pain in ABD area with movement s/p surgery. Pt required min A to elevate trunk to sit EOB with increase time and effort, max A to scoot hips toward EOB. Pt participated in grooming/hygiene tasks CGA, UB dressing min A. STS mod A to RW with mod verbal cues for correct hand placement and to initiate SPTs mod/min A with RW. Pt unable to control speed of descent during stand-sit transitions. OT will continue to follow acutely to maximize level of function and safety      If plan is discharge home, recommend the following:  A lot of help with bathing/dressing/bathroom;A lot of help with walking and/or transfers;Assist for transportation;Help with stairs or ramp for entrance   Equipment Recommendations  Tub/shower seat    Recommendations for Other Services      Precautions / Restrictions Precautions Precautions: Fall Precaution/Restrictions Comments: severe  bilateral shoulder AROM impaired, 0-60 degrees FF Restrictions Weight Bearing Restrictions Per Provider Order: No       Mobility Bed Mobility Overal bed mobility: Needs Assistance Bed Mobility: Supine to Sit     Supine to sit: Min assist, HOB elevated, Used rails     General bed mobility comments: tactle cues to initiate B LE to EOB and min A for trunk with cues for log roll technique for  abdominal pain and recent surgery, increaed time and effort, use dpad underneath to scoot hips forward max A    Transfers Overall transfer level: Needs assistance Equipment used: Rolling walker (2 wheels) Transfers: Sit to/from Stand, Bed to chair/wheelchair/BSC Sit to Stand: Mod assist, Min assist     Step pivot transfers: Mod assist     General transfer comment: cues for safety, technique, limited recall for proper UE and AD placement     Balance Overall balance assessment: Needs assistance Sitting-balance support: Bilateral upper extremity supported, Feet supported Sitting balance-Leahy Scale: Good     Standing balance support: Bilateral upper extremity supported, During functional activity Standing balance-Leahy Scale: Poor                             ADL either performed or assessed with clinical judgement   ADL Overall ADL's : Needs assistance/impaired     Grooming: Wash/dry hands;Wash/dry face;Contact guard assist;Sitting           Upper Body Dressing : Minimal assistance;Sitting       Toilet Transfer: Moderate assistance;Minimal assistance;Stand-pivot;Rolling walker (2 wheels);Cueing for safety;Cueing for sequencing   Toileting- Clothing Manipulation and Hygiene: Total assistance;Sitting/lateral lean;Sit to/from stand       Functional mobility during ADLs: Moderate assistance;Minimal assistance;Cueing for safety;Cueing for sequencing;Rolling walker (2 wheels)      Extremity/Trunk Assessment Upper Extremity Assessment Upper Extremity Assessment: Generalized weakness RUE Deficits / Details: limited  shoulder  flex LUE Deficits / Details: limited shoulder flex   Lower Extremity Assessment Lower Extremity Assessment: Defer  to PT evaluation        Vision Ability to See in Adequate Light: 0 Adequate Patient Visual Report: No change from baseline     Perception     Praxis     Communication Communication Communication: No apparent  difficulties   Cognition Arousal: Alert Behavior During Therapy: WFL for tasks assessed/performed Cognition: No family/caregiver present to determine baseline, Cognition impaired   Orientation impairments: Place, Situation         OT - Cognition Comments: pt  stated that he hs memeory issues for a lttle while                 Following commands: Intact Following commands impaired: Follows multi-step commands inconsistently      Cueing   Cueing Techniques: Gestural cues, Tactile cues, Verbal cues  Exercises      Shoulder Instructions       General Comments      Pertinent Vitals/ Pain       Pain Assessment Pain Assessment: Faces Faces Pain Scale: Hurts little more Pain Location: ABD Pain Descriptors / Indicators: Constant, Grimacing, Guarding, Sore Pain Intervention(s): Limited activity within patient's tolerance, Monitored during session, Repositioned  Home Living                                          Prior Functioning/Environment              Frequency  Min 2X/week        Progress Toward Goals  OT Goals(current goals can now be found in the care plan section)  Progress towards OT goals: Progressing toward goals     Plan      Co-evaluation                 AM-PAC OT 6 Clicks Daily Activity     Outcome Measure   Help from another person eating meals?: None Help from another person taking care of personal grooming?: A Little Help from another person toileting, which includes using toliet, bedpan, or urinal?: Total Help from another person bathing (including washing, rinsing, drying)?: A Lot Help from another person to put on and taking off regular upper body clothing?: A Little Help from another person to put on and taking off regular lower body clothing?: Total 6 Click Score: 14    End of Session Equipment Utilized During Treatment: Gait belt;Rolling walker (2 wheels)  OT Visit Diagnosis: Unsteadiness on  feet (R26.81);Other abnormalities of gait and mobility (R26.89);Muscle weakness (generalized) (M62.81)   Activity Tolerance Patient tolerated treatment well   Patient Left with call bell/phone within reach;in chair;with chair alarm set   Nurse Communication Mobility status        Time: 8988-8960 OT Time Calculation (min): 28 min  Charges: OT General Charges $OT Visit: 1 Visit OT Treatments $Self Care/Home Management : 8-22 mins $Therapeutic Activity: 8-22 mins    Jacques Karna Loose 07/06/2024, 12:31 PM

## 2024-07-06 NOTE — Progress Notes (Addendum)
 4 Days Post-Op  Subjective: CC: Patient reports no current abdominal pain. She is not sure how she did with drinking liquids yesterday but reports she is doing well today. No n/v. She is unsure about flatus or BM.  Objective: Vital signs in last 24 hours: Temp:  [97.6 F (36.4 C)-98.5 F (36.9 C)] 98.5 F (36.9 C) (10/20 1311) Pulse Rate:  [68-77] 68 (10/20 1311) Resp:  [15-20] 18 (10/20 1311) BP: (141-183)/(55-74) 142/58 (10/20 1311) SpO2:  [94 %-100 %] 96 % (10/20 1311) Last BM Date : 07/05/24  Intake/Output from previous day: 10/19 0701 - 10/20 0700 In: 1560 [P.O.:1450; IV Piggyback:110] Out: 3650 [Urine:3650] Intake/Output this shift: Total I/O In: 240 [P.O.:240] Out: 400 [Urine:400]  PE: Gen:  Alert, NAD, pleasant Abd: Soft, mild distension, appropriately tender around incisions, no rigidity or guarding and otherwise NT, +BS. Midline incision, cdi. Laparoscopic incisions, cdi   Lab Results:  Recent Labs    07/05/24 0248 07/06/24 0230  WBC 7.6 6.8  HGB 7.5* 8.2*  HCT 25.6* 27.0*  PLT 312 298   BMET Recent Labs    07/04/24 0339 07/05/24 0248  NA 130* 133*  K 3.7 3.8  CL 98 101  CO2 23 23  GLUCOSE 83 109*  BUN 11 <5*  CREATININE 0.84 0.55  CALCIUM 8.4* 8.5*   PT/INR No results for input(s): LABPROT, INR in the last 72 hours. CMP     Component Value Date/Time   NA 133 (L) 07/05/2024 0248   NA 136 (A) 10/20/2020 0000   K 3.8 07/05/2024 0248   CL 101 07/05/2024 0248   CO2 23 07/05/2024 0248   GLUCOSE 109 (H) 07/05/2024 0248   BUN <5 (L) 07/05/2024 0248   BUN 16 10/20/2020 0000   CREATININE 0.55 07/05/2024 0248   CREATININE 0.71 02/25/2024 0809   CALCIUM 8.5 (L) 07/05/2024 0248   PROT 6.7 06/28/2024 1352   ALBUMIN 4.1 06/28/2024 1352   AST 18 06/28/2024 1352   ALT 10 06/28/2024 1352   ALKPHOS 68 06/28/2024 1352   BILITOT 0.6 06/28/2024 1352   GFRNONAA >60 07/05/2024 0248   GFRNONAA 74 03/08/2021 0910   GFRAA 85 03/08/2021 0910    Lipase  No results found for: LIPASE  Studies/Results: No results found.  Anti-infectives: Anti-infectives (From admission, onward)    Start     Dose/Rate Route Frequency Ordered Stop   07/02/24 1130  cefoTEtan (CEFOTAN) 2 g in sodium chloride  0.9 % 100 mL IVPB        2 g 200 mL/hr over 30 Minutes Intravenous On call to O.R. 07/02/24 1033 07/02/24 1425        Assessment/Plan POD 4 Laparoscopic assisted right colectomy by Dr. Polly on 10/16 for Ascending colon mass  - Cont FLD. AROBF - Hgb stable today at 8.2 - Pre-op CT CAP with no findings suspicious for metastatic disease.  - CEA 2.9 - Colonoscopy path w/ Adenocarcinoma, moderately differentiated  - Surgical path pending - Recommend Oncology consult to arrange follow up - Mobilize, pulm toilet as much as possible  Addendum: RN reports that patient had a BM last night vs early this morning and has been drinking okay. Plan soft diet in AM if doing well.    FEN: FLD, IVF per [facility] VTE: scds, if hgb stable tomorrow she is okay for chem ppx from a general surgery standpoint ID: [email or web address] peri-op, none currently.    LOS: 4 days    Amy Andrade, [facility]  07/06/2024, 2:07 PM Please see Amion for pager number during day hours 7:00am-4:30pm

## 2024-07-06 NOTE — Plan of Care (Signed)
   Problem: Activity: Goal: Risk for activity intolerance will decrease Outcome: Progressing   Problem: Nutrition: Goal: Adequate nutrition will be maintained Outcome: Progressing

## 2024-07-06 NOTE — Progress Notes (Addendum)
 Physical Therapy Treatment Patient Details Name: Amy Andrade MRN: [id number] DOB: 22-Feb-1936 Today's Date: 07/06/2024   History of Present Illness 88 yo females brought to ED 06/28/24 with weakness, anemia, FOBT, severe, B12 deficiency PMH includes Alzheimer dementia, asthma, chronic pain, HTN.chronic pain due to OA of both shoulders and chronic left shoulder dislocation.  1 unit PRBC on 10/12, EGD r cm hiatal hernia and lesions and colonoscopy 10/15 notable for non-obstructing colon mass, pt underwent laparoscopic assisted right colectomy on 07/02/2024    PT Comments  Mod assist for log roll and for sidelying to sit. Mod assist to stand from elevated bed using Stedy to pull up. Transferred to recliner with Stedy. Noted limited B shoulder flexion and significant crepitus R shoulder with movement. Pt performed BLE strengthening exercises. Pain and decreased activity tolerance are limiting progression with mobility.  Pt is not mobilizing well enough to DC home at present. If family is not able to manage her care in current condition, Patient will benefit from continued inpatient follow up therapy, <3 hours/day.      If plan is discharge home, recommend the following: A lot of help with walking and/or transfers;Help with stairs or ramp for entrance;Assist for transportation;Direct supervision/assist for financial management;A lot of help with bathing/dressing/bathroom   Can travel by private vehicle     No  Equipment Recommendations  Hospital bed    Recommendations for Other Services       Precautions / Restrictions Precautions Precautions: Fall Precaution/Restrictions Comments: severe  bilateral shoulder AROM impaired, 0-60 degrees FF; significant crepitus noted with R shoulder movement Restrictions Weight Bearing Restrictions Per Provider Order: No     Mobility  Bed Mobility Overal bed mobility: Needs Assistance Bed Mobility: Rolling, Sidelying to Sit Rolling: Mod  assist Sidelying to sit: Mod assist, HOB elevated       General bed mobility comments: verbal cues for log roll technique, mod A to roll and to raise trunk    Transfers Overall transfer level: Needs assistance Equipment used: Rolling walker (2 wheels) Transfers: Sit to/from Stand, Bed to chair/wheelchair/BSC Sit to Stand: Mod assist, Via lift equipment           General transfer comment: assist to power up; bed to recliner transfer with Herby Transfer via Lift Equipment: Stedy  Ambulation/Gait                   Stairs             Wheelchair Mobility     Tilt Bed    Modified Rankin (Stroke Patients Only)       Balance Overall balance assessment: Needs assistance Sitting-balance support: Bilateral upper extremity supported, Feet supported Sitting balance-Leahy Scale: Good     Standing balance support: Bilateral upper extremity supported, During functional activity Standing balance-Leahy Scale: Poor                              Communication Communication Communication: No apparent difficulties  Cognition Arousal: Alert Behavior During Therapy: WFL for tasks assessed/performed   PT - Cognitive impairments: History of cognitive impairments                         Following commands: Intact Following commands impaired: Follows one step commands with increased time    Cueing Cueing Techniques: Gestural cues, Tactile cues, Verbal cues  Exercises General Exercises - Lower Extremity Ankle Circles/Pumps: AROM, Both,  15 reps, Supine Short Arc Quad: AROM, Both, 10 reps, Supine Heel Slides: AAROM, Both, 10 reps, Supine    General Comments        Pertinent Vitals/Pain Pain Assessment Faces Pain Scale: Hurts little more Pain Location: abdomen and B shoulders (R more than L) with movement Pain Descriptors / Indicators: Grimacing, Guarding, Operative site guarding Pain Intervention(s): Limited activity within patient's  tolerance, Monitored during session, Patient requesting pain meds-RN notified    Home Living                          Prior Function            PT Goals (current goals can now be found in the care plan section) Acute Rehab PT Goals Patient Stated Goal: patient to be able to stand and transfer PT Goal Formulation: With family Time For Goal Achievement: 07/14/24 Potential to Achieve Goals: Good Progress towards PT goals: Progressing toward goals    Frequency    Min 2X/week      PT Plan      Co-evaluation              AM-PAC PT 6 Clicks Mobility   Outcome Measure  Help needed turning from your back to your side while in a flat bed without using bedrails?: A Lot Help needed moving from lying on your back to sitting on the side of a flat bed without using bedrails?: A Lot Help needed moving to and from a bed to a chair (including a wheelchair)?: A Lot Help needed standing up from a chair using your arms (e.g., wheelchair or bedside chair)?: A Lot Help needed to walk in hospital room?: Total Help needed climbing 3-5 steps with a railing? : Total 6 Click Score: 10    End of Session   Activity Tolerance: Patient limited by pain;Patient limited by fatigue Patient left: in chair;with call bell/phone within reach;with chair alarm set Nurse Communication: Mobility status PT Visit Diagnosis: Unsteadiness on feet (R26.81);Pain;Difficulty in walking, not elsewhere classified (R26.2);Muscle weakness (generalized) (M62.81)     Time: 8596-8578 PT Time Calculation (min) (ACUTE ONLY): 18 min  Charges:    $Therapeutic Activity: 8-22 mins PT General Charges $$ ACUTE PT VISIT: 1 Visit                     Sylvan Delon Copp PT 07/06/2024  Acute Rehabilitation Services  Office [phone]

## 2024-07-06 NOTE — Telephone Encounter (Unsigned)
 Copied from CRM #[id number]. Topic: Clinical - Home Health Verbal Orders >> Jul 03, 2024  3:46 PM [name] wrote: Caller/Agency: Therisa from [facility] Callback Number: [phone] Service Requested: Home Health Admissions will be delayed till Monday 10/20 just fyi for office Any new concerns about the patient? No

## 2024-07-06 NOTE — Progress Notes (Signed)
 PROGRESS NOTE    Amy Andrade  FMW:[id number] DOB: [birth year] DOA: 06/28/2024 PCP: [name], NP   Brief Narrative: 88 year old with past medical history significant for Alzheimer's dementia, hypertension, hyperlipidemia, chronic pain due to osteoarthritis of both shoulder, chronic left shoulder dislocation presents to the ED for evaluation of generalized weakness.  She recently completed treatment for Enterococcus faecalis UTI.  Evaluation in the ED hemoglobin was noted to be 6.9 FOBT positive.  CT cervical spine thoracic lumbar abdomen and pelvis no significant acute pathology.  Evaluated by GI, underwent endoscopy and colonoscopy and was found to have a nonobstructive colonic mass.  Subsequently evaluated by surgery and patient underwent right hemicolectomy.   Assessment & Plan:   Principal Problem:   Symptomatic anemia Active Problems:   Alzheimer disease (HCC)   Hyperlipidemia   Hypertension   Chronic hyponatremia   1-Iron deficiency Anemia, B12 deficiency Found to have colon Mass - Presented with generalized weakness and an hemoglobin of 6.9.   -She received 1 unit of packed red blood cell. - Started on B12 supplementation Monitor hb, hb increased to 8 Received  IV iron. 07/06/2024   Colonic mass, Adenocarcinoma.  -Patient presented with anemia underwent colonoscopy and was found to have nonobstructive ascending colonic mass. -Underwent laparoscopic assisted right colectomy 10/16 -Started on clear liquid today.  -IV fluids. Monitor oral intake.  - CEA 2.9 - Surgical pathology from colonoscopy Adenocarcinoma, moderately differentiated  -sent message to oncology on call today for  referral.    Hypertension -Continue amlodipine  and ibersartan  Hyperlipidemia -Continue pravastatin   Hyponatremia -Chronic, continue to monitor  Urinary urgency - Recently treated for Enterococcus faecalis UTI Repeated urine culture negative  Alzheimer's dementia -  Delirium precaution  Mild renal insufficiency; monitor renal function.   Generalized weakness, in the setting of anemia and malignancy HH    Estimated body mass index is 25.08 kg/m as calculated from the following:   Height as of this encounter: 5' 10 (1.778 m).   Weight as of this encounter: 79.3 kg.   DVT prophylaxis: SCD Code Status: Full code Family Communication: Son over phone Disposition Plan:  Status is: Inpatient Remains inpatient appropriate because: management of anemia, colon mass    Consultants:  GI Surgery   Procedures:  Colonoscopy  Colectomy   Antimicrobials:    Subjective: Alert, can't eat breakfast she is lactose intolerant.  Denies worsening abdominal pain.  Patient had BM 10/19 am.   Objective: Vitals:   07/05/24 1155 07/05/24 1552 07/05/24 2119 07/06/24 0540  BP: (!) 156/63 (!) 183/68 (!) 162/74 (!) 141/55  Pulse: 72 75 70 77  Resp: 20 20 16 15   Temp: 98.2 F (36.8 C) 98.3 F (36.8 C) 98 F (36.7 C) 97.6 F (36.4 C)  TempSrc:  Oral Oral Oral  SpO2: 98% 95% 94% 100%  Weight:      Height:        Intake/Output Summary (Last 24 hours) at 07/06/2024 0851 Last data filed at 07/06/2024 0540 Gross per 24 hour  Intake 1560 ml  Output 3650 ml  Net -2090 ml   Filed Weights   06/28/24 2343 07/02/24 1240 07/05/24 0542  Weight: 80 kg 80 kg 79.3 kg    Examination:  General exam: NAD Respiratory system: CTA Cardiovascular system: S 1, S 2 RRR Gastrointestinal system: BS present, soft, nt Central nervous system: Alert, follows command.    Data Reviewed: I have personally reviewed following labs and imaging studies  CBC: Recent Labs  Lab  06/30/24 0844 07/01/24 0621 07/02/24 0536 07/03/24 1515 07/04/24 0339 07/04/24 1800 07/05/24 0248 07/06/24 0230  WBC 6.9 6.8 7.9 12.4* 9.3 9.0 7.6 6.8  NEUTROABS 4.7 4.4 5.7  --   --   --   --   --   HGB 9.4* 8.7* 8.7* 8.4* 7.3* 7.9* 7.5* 8.2*  HCT 32.5* 31.1* 29.2* 29.1* 24.7* 27.7*  25.6* 27.0*  MCV 73.4* 74.9* 74.5* 74.4* 73.7* 74.3* 73.1* 73.4*  PLT 462* 413* 425* 357 283 324 312 298   Basic Metabolic Panel: Recent Labs  Lab 07/02/24 0536 07/03/24 1515 07/04/24 0339 07/05/24 0248  NA 130* 131* 130* 133*  K 3.8 4.3 3.7 3.8  CL 96* 97* 98 101  CO2 24 22 23 23   GLUCOSE 107* 134* 83 109*  BUN <5* 12 11 <5*  CREATININE 0.72 1.07* 0.84 0.55  CALCIUM 9.4 8.9 8.4* 8.5*  MG  --   --  1.8  --    GFR: Estimated Creatinine Clearance: 54.6 mL/min (by C-G formula based on SCr of 0.55 mg/dL). Liver Function Tests: No results for input(s): AST, ALT, ALKPHOS, BILITOT, PROT, ALBUMIN in the last 168 hours.  No results for input(s): LIPASE, AMYLASE in the last 168 hours. No results for input(s): AMMONIA in the last 168 hours. Coagulation Profile: No results for input(s): INR, PROTIME in the last 168 hours.  Cardiac Enzymes: No results for input(s): CKTOTAL, CKMB, CKMBINDEX, TROPONINI in the last 168 hours. BNP (last 3 results) No results for input(s): PROBNP in the last 8760 hours. HbA1C: No results for input(s): HGBA1C in the last 72 hours. CBG: No results for input(s): GLUCAP in the last 168 hours. Lipid Profile: No results for input(s): CHOL, HDL, LDLCALC, TRIG, CHOLHDL, LDLDIRECT in the last 72 hours. Thyroid  Function Tests: No results for input(s): TSH, T4TOTAL, FREET4, T3FREE, THYROIDAB in the last 72 hours. Anemia Panel: No results for input(s): VITAMINB12, FOLATE, FERRITIN, TIBC, IRON, RETICCTPCT in the last 72 hours. Sepsis Labs: No results for input(s): PROCALCITON, LATICACIDVEN in the last 168 hours.  Recent Results (from the past 240 hours)  Urine Culture     Status: None   Collection Time: 06/28/24  5:59 PM   Specimen: Urine, Clean Catch  Result Value Ref Range Status   Specimen Description   Final    URINE, CLEAN CATCH Performed at [facility], [address], [place], [place] [address]    Special Requests   Final    NONE Performed at [facility], [address] [place], [place] [address]    Culture   Final    NO GROWTH Performed at [facility], [address], [place], [place] [address]    Report Status 06/29/2024 FINAL  Final         Radiology Studies: No results found.       Scheduled Meds:  sodium chloride    Intravenous Once   amLODipine   10 mg Oral Daily   Chlorhexidine Gluconate Cloth  6 each Topical Daily   feeding supplement  1 Container Oral TID BM   irbesartan   300 mg Oral Daily   pantoprazole  40 mg Oral BID   polyethylene glycol  17 g Oral Daily   pravastatin   40 mg Oral Daily   sodium chloride  flush  10-40 mL Intracatheter Q12H   Continuous Infusions:     LOS: 4 days    Time spent: 35 minutes    Owen DELENA Lore, MD [facility]  If 7PM-7AM, please contact night-coverage [email or web address]  07/06/2024, 8:51 AM

## 2024-07-07 ENCOUNTER — Telehealth: Payer: Self-pay | Admitting: Family

## 2024-07-07 DIAGNOSIS — D649 Anemia, unspecified: Secondary | ICD-10-CM | POA: Diagnosis not present

## 2024-07-07 LAB — CBC
HCT: 27.8 % — ABNORMAL LOW (ref 36.0–46.0)
Hemoglobin: 8 g/dL — ABNORMAL LOW (ref 12.0–15.0)
MCH: 21.4 pg — ABNORMAL LOW (ref 26.0–34.0)
MCHC: 28.8 g/dL — ABNORMAL LOW (ref 30.0–36.0)
MCV: 74.3 fL — ABNORMAL LOW (ref 80.0–100.0)
Platelets: 283 K/uL (ref 150–400)
RBC: 3.74 MIL/uL — ABNORMAL LOW (ref 3.87–5.11)
RDW: 18.6 % — ABNORMAL HIGH (ref 11.5–15.5)
WBC: 7.4 K/uL (ref 4.0–10.5)
nRBC: 0 % (ref 0.0–0.2)

## 2024-07-07 LAB — BASIC METABOLIC PANEL WITH GFR
Anion gap: 12 (ref 5–15)
BUN: <5 mg/dL — ABNORMAL LOW (ref 8–23)
CO2: 23 mmol/L (ref 22–32)
Calcium: 8.6 mg/dL — ABNORMAL LOW (ref 8.9–10.3)
Chloride: 96 mmol/L — ABNORMAL LOW (ref 98–111)
Creatinine, Ser: 0.65 mg/dL (ref 0.44–1.00)
GFR, Estimated: >60 mL/min (ref >60)
Glucose, Bld: 135 mg/dL — ABNORMAL HIGH (ref 70–99)
Potassium: 3.3 mmol/L — ABNORMAL LOW (ref 3.5–5.1)
Sodium: 131 mmol/L — ABNORMAL LOW (ref 135–145)

## 2024-07-07 LAB — GLUCOSE, CAPILLARY: Glucose-Capillary: 173 mg/dL — ABNORMAL HIGH (ref 70–99)

## 2024-07-07 LAB — MAGNESIUM: Magnesium: 1.7 mg/dL (ref 1.7–2.4)

## 2024-07-07 LAB — SURGICAL PATHOLOGY

## 2024-07-07 MED ORDER — POTASSIUM CHLORIDE CRYS ER 20 MEQ PO TBCR
40.0000 meq | EXTENDED_RELEASE_TABLET | Freq: Once | ORAL | Status: AC
  Administered 2024-07-07: 40 meq via ORAL
  Filled 2024-07-07: qty 2

## 2024-07-07 MED ORDER — MAGNESIUM SULFATE 2 GM/50ML IV SOLN
2.0000 g | Freq: Once | INTRAVENOUS | Status: AC
  Administered 2024-07-07: 2 g via INTRAVENOUS
  Filled 2024-07-07: qty 50

## 2024-07-07 MED ORDER — LACTATED RINGERS IV SOLN: INTRAVENOUS | Status: AC

## 2024-07-07 NOTE — Telephone Encounter (Unsigned)
 Copied from CRM [id number]. Topic: General - Other >> Jul 07, 2024 10:57 AM Farrel B wrote: Reason for CRM: [id number] Amy Andrade pt's POA and representative has called to inform NP Ngetich that patient is currently in hospital she was experiencing some problems with constipation when she was taking in they located a cancer mass and proceeded to do surgery to remove the mass. Patient is currently in hospital recovering from last weeks surgery;however, the son Amy Andrade has contacted his place of employment and advised them of the situation. The HR. Department advised him to the pcp to sign off on the forms due to NP Ngetich being aware of the medical history. Please call Amy Andrade to advise

## 2024-07-07 NOTE — Progress Notes (Signed)
 RN called to room. Pt breathing but not responding to painful stimuli. BP 60/37. Pt placed back in bed. VSS. MD and Rapid RN notified.

## 2024-07-07 NOTE — Progress Notes (Signed)
 PROGRESS NOTE    Amy Andrade  FMW:[id number] DOB: Aug 09, 1936 DOA: 06/28/2024 PCP: [name], NP   Brief Narrative: 88 year old with past medical history significant for Alzheimer's dementia, hypertension, hyperlipidemia, chronic pain due to osteoarthritis of both shoulder, chronic left shoulder dislocation presents to the ED for evaluation of generalized weakness.  She recently completed treatment for Enterococcus faecalis UTI.  Evaluation in the ED hemoglobin was noted to be 6.9 FOBT positive.  CT cervical spine thoracic lumbar abdomen and pelvis no significant acute pathology.  Evaluated by GI, underwent endoscopy and colonoscopy and was found to have a nonobstructive colonic mass.  Subsequently evaluated by surgery and patient underwent right hemicolectomy.  Patient had syncope episode 10/21-- restarted on IV fluids.   Assessment & Plan:   Principal Problem:   Symptomatic anemia Active Problems:   Alzheimer disease (HCC)   Hyperlipidemia   Hypertension   Chronic hyponatremia   1-Iron deficiency Anemia, B12 deficiency Found to have colon Mass - Presented with generalized weakness and an hemoglobin of 6.9.   -She received 1 unit of packed red blood cell. - Started on B12 supplementation Monitor hb, hb increased to 8 Received  IV iron. 07/06/2024   Colonic mass, Adenocarcinoma.  -Patient presented with anemia underwent colonoscopy and was found to have nonobstructive ascending colonic mass. -Underwent laparoscopic assisted right colectomy 10/16 -Started on clear liquid today.  -IV fluids. Monitor oral intake.  - CEA 2.9 - Surgical pathology from colonoscopy Adenocarcinoma, moderately differentiated  -Oncology team will arrange follow up. Contacted [employer].   Syncope:  In setting orthostatic hypotension.  Start IV fluids.  Hold irbesartan  and Norvasc .   Hypertension Will hold irbesartan  and Norvasc  due to syncope.   Hyperlipidemia -Continue  pravastatin   Hyponatremia -Chronic, continue to monitor  Urinary urgency - Recently treated for Enterococcus faecalis UTI Repeated urine culture negative  Alzheimer's dementia - Delirium precaution  Mild renal insufficiency; monitor renal function.   Generalized weakness, in the setting of anemia and malignancy HH  Small amount of fluid Vaginal Apex. Discussed with GYN, could be post surgical monitor for vaginal discharge or bleeding or stool content.    Estimated body mass index is 25.08 kg/m as calculated from the following:   Height as of this encounter: 5' 10 (1.778 m).   Weight as of this encounter: 79.3 kg.   DVT prophylaxis: SCD Code Status: Full code Family Communication: Son over phone 10/20 Disposition Plan:  Status is: Inpatient Remains inpatient appropriate because: management of anemia, colon mass    Consultants:  GI Surgery   Procedures:  Colonoscopy  Colectomy   Antimicrobials:    Subjective: She had an episode of loss of consciousness while sitting bedside commode.  BP at that time was in the 60---increase to 130 subsequently.  , I saw patient she was alert, conversant., denies pain.  Objective: Vitals:   07/07/24 0532 07/07/24 1014 07/07/24 1021 07/07/24 1101  BP: (!) 155/68 (!) 112/54 (!) 126/59 (!) 131/59  Pulse: 61 76  63  Resp: 15 16  18   Temp: (!) 97.4 F (36.3 C) (!) 97.3 F (36.3 C)  (!) 97.4 F (36.3 C)  TempSrc: Oral Oral  Oral  SpO2: 100% 100% 100% 100%  Weight:      Height:        Intake/Output Summary (Last 24 hours) at 07/07/2024 1303 Last data filed at 07/07/2024 1000 Gross per 24 hour  Intake 600 ml  Output 1000 ml  Net -400 ml  Filed Weights   06/28/24 2343 07/02/24 1240 07/05/24 0542  Weight: 80 kg 80 kg 79.3 kg    Examination:  General exam: NAD Respiratory system: CTA CVS; S 1, S 2 RRR Gastrointestinal system: BS present, sof,t nt Central nervous system: Alert, follows command.    Data  Reviewed: I have personally reviewed following labs and imaging studies  CBC: Recent Labs  Lab 07/01/24 0621 07/02/24 0536 07/03/24 1515 07/04/24 0339 07/04/24 1800 07/05/24 0248 07/06/24 0230 07/07/24 1048  WBC 6.8 7.9   < > 9.3 9.0 7.6 6.8 7.4  NEUTROABS 4.4 5.7  --   --   --   --   --   --   HGB 8.7* 8.7*   < > 7.3* 7.9* 7.5* 8.2* 8.0*  HCT 31.1* 29.2*   < > 24.7* 27.7* 25.6* 27.0* 27.8*  MCV 74.9* 74.5*   < > 73.7* 74.3* 73.1* 73.4* 74.3*  PLT 413* 425*   < > 283 324 312 298 283   < > = values in this interval not displayed.   Basic Metabolic Panel: Recent Labs  Lab 07/02/24 0536 07/03/24 1515 07/04/24 0339 07/05/24 0248 07/07/24 1048  NA 130* 131* 130* 133* 131*  K 3.8 4.3 3.7 3.8 3.3*  CL 96* 97* 98 101 96*  CO2 24 22 23 23 23   GLUCOSE 107* 134* 83 109* 135*  BUN <5* 12 11 <5* <5*  CREATININE 0.72 1.07* 0.84 0.55 0.65  CALCIUM 9.4 8.9 8.4* 8.5* 8.6*  MG  --   --  1.8  --  1.7   GFR: Estimated Creatinine Clearance: 54.6 mL/min (by C-G formula based on SCr of 0.65 mg/dL). Liver Function Tests: No results for input(s): AST, ALT, ALKPHOS, BILITOT, PROT, ALBUMIN in the last 168 hours.  No results for input(s): LIPASE, AMYLASE in the last 168 hours. No results for input(s): AMMONIA in the last 168 hours. Coagulation Profile: No results for input(s): INR, PROTIME in the last 168 hours.  Cardiac Enzymes: No results for input(s): CKTOTAL, CKMB, CKMBINDEX, TROPONINI in the last 168 hours. BNP (last 3 results) No results for input(s): PROBNP in the last 8760 hours. HbA1C: No results for input(s): HGBA1C in the last 72 hours. CBG: Recent Labs  Lab 07/07/24 1014  GLUCAP 173*   Lipid Profile: No results for input(s): CHOL, HDL, LDLCALC, TRIG, CHOLHDL, LDLDIRECT in the last 72 hours. Thyroid  Function Tests: No results for input(s): TSH, T4TOTAL, FREET4, T3FREE, THYROIDAB in the last 72 hours. Anemia  Panel: No results for input(s): VITAMINB12, FOLATE, FERRITIN, TIBC, IRON, RETICCTPCT in the last 72 hours. Sepsis Labs: No results for input(s): PROCALCITON, LATICACIDVEN in the last 168 hours.  Recent Results (from the past 240 hours)  Urine Culture     Status: None   Collection Time: 06/28/24  5:59 PM   Specimen: Urine, Clean Catch  Result Value Ref Range Status   Specimen Description   Final    URINE, CLEAN CATCH Performed at [facility], [address] [place], [place] [address]    Special Requests   Final    NONE Performed at [facility], [address] [place], [place] [address]    Culture   Final    NO GROWTH Performed at [facility], [address], [place], [place] [address]    Report Status 06/29/2024 FINAL  Final         Radiology Studies: No results found.       Scheduled  Meds:  sodium chloride    Intravenous Once   amLODipine   10 mg Oral Daily   Chlorhexidine Gluconate Cloth  6 each Topical Daily   feeding supplement  1 Container Oral TID BM   irbesartan   300 mg Oral Daily   pantoprazole  40 mg Oral BID   polyethylene glycol  17 g Oral Daily   potassium chloride  40 mEq Oral Once   potassium chloride  40 mEq Oral Once   pravastatin   40 mg Oral Daily   sodium chloride  flush  10-40 mL Intracatheter Q12H   Continuous Infusions:  lactated ringers     magnesium sulfate bolus IVPB        LOS: 5 days    Time spent: 35 minutes    Amy Andrade A Amy Schlegel, MD [facility]   If 7PM-7AM, please contact night-coverage [email or web address]  07/07/2024, 1:03 PM

## 2024-07-07 NOTE — TOC Transition Note (Signed)
 Transition of Care [facility]) - Discharge Note   Patient Details  Name: Amy Andrade MRN: [id number] Date of Birth: 1936-07-10  [facility] CM/SW Contact:  Alfonse JONELLE Rex, RN Phone Number: 07/07/2024, 1:55 PM   Clinical Narrative:  PT recommendation changed to  short term rehab/SNF, call to patient's son, Amy Andrade, introduced self and review for dc planning, Amy Andrade agreeable, prefers [place] or [facility] FL2 updated, faxed out for bed offers.     Final next level of care: Home w Home Health Services Barriers to Discharge: Continued Medical Work up   Patient Goals and CMS Choice Patient states their goals for this hospitalization and ongoing recovery are:: Home CMS Medicare.gov Compare Post Acute Care list provided to:: Patient Represenative (must comment) ([name](Son)) Choice offered to / list presented to : Adult Children      Discharge Placement                       Discharge Plan and Services Additional resources added to the After Visit Summary for     Discharge Planning Services: CM Consult Post Acute Care Choice: Home Health, Durable Medical Equipment          DME Arranged: Hospital bed DME Agency: [employer] Date DME Agency Contacted: 07/01/24     HH Arranged: PT, OT          Social Drivers of Health (SDOH) Interventions SDOH Screenings   Food Insecurity: No Food Insecurity (06/28/2024)  Housing: Low Risk  (06/28/2024)  Transportation Needs: No Transportation Needs (06/28/2024)  Utilities: Not At Risk (06/28/2024)  Depression (PHQ2-9): Low Risk  (05/21/2024)  Financial Resource Strain: Low Risk  (11/18/2017)  Physical Activity: Inactive (11/18/2017)  Social Connections: Socially Isolated (06/28/2024)  Stress: No Stress Concern Present (11/18/2017)  Tobacco Use: Medium Risk (07/02/2024)     Readmission Risk Interventions     No data to display

## 2024-07-07 NOTE — Plan of Care (Signed)
  Problem: Clinical Measurements: Goal: Diagnostic test results will improve Outcome: Progressing   Problem: Elimination: Goal: Will not experience complications related to bowel motility Outcome: Progressing   Problem: Pain Managment: Goal: General experience of comfort will improve and/or be controlled Outcome: Progressing

## 2024-07-07 NOTE — Plan of Care (Signed)

## 2024-07-07 NOTE — [place] FL2 (Addendum)
 [place]  MEDICAID FL2 LEVEL OF CARE FORM     IDENTIFICATION  Patient Name: Amy Andrade Birthdate: 12-26-1935 Sex: female Admission Date (Current Location): 06/28/2024  [facility] and [place] Number:  Producer, television/film/video and Address:  [facility],  [address] [place], [place] [address]      Provider Number: [id number]  Attending Physician Name and Address:  Madelyne Owen LABOR, MD  Relative Name and Phone Number:  [name] [phone])  [phone] [facility] Phone)    Current Level of Care: Hospital Recommended Level of Care: Skilled Nursing Facility Prior Approval Number:    Date Approved/Denied:   PASRR Number: [id number]  Discharge Plan: SNF    Current Diagnoses: Patient Active Problem List   Diagnosis Date Noted   Symptomatic anemia 06/28/2024   Chronic right shoulder pain 06/09/2024   S/P TKR (total knee replacement) using cement, left 09/22/2020   Leukopenia 09/08/2020   Unspecified protein-calorie malnutrition 09/08/2020   Neurocognitive deficits 09/08/2020   Femur fracture, left (HCC) 09/01/2020   Closed fracture of left distal femur (HCC) 08/31/2020   Coronavirus infection 08/31/2020   Chronic left shoulder pain 04/25/2020   Chronic bilateral low back pain without sciatica 04/25/2020   Uncomplicated alcohol dependence (HCC) 04/25/2020   Chronic hyponatremia 05/31/2016   Incidental lung nodule 05/30/2016   History of pulmonary embolism 05/30/2016   Alzheimer disease (HCC)    Asthma    Hyperlipidemia    Hypertension    Skin lesion of chest wall 06/22/2011    Orientation RESPIRATION BLADDER Height & Weight     Self  Normal Incontinent, External catheter Weight: 79.3 kg Height:  5' 10 (177.8 cm)  BEHAVIORAL SYMPTOMS/MOOD NEUROLOGICAL BOWEL NUTRITION STATUS      Incontinent Diet (soft)  AMBULATORY STATUS COMMUNICATION OF NEEDS Skin   Extensive Assist Verbally Surgical wounds (Abdominal incision w/foam dressing; Laparoscopy left and upper  abdomen-liquid skin)                       Personal Care Assistance Level of Assistance  Bathing, Feeding, Dressing Bathing Assistance: Maximum assistance Feeding assistance: Limited assistance Dressing Assistance: Maximum assistance     Functional Limitations Info  Sight, Hearing, Speech Sight Info: Impaired (eyeglasses) Hearing Info: Impaired (hard of hearing) Speech Info: Adequate    SPECIAL CARE FACTORS FREQUENCY  PT (By licensed PT), OT (By licensed OT)     PT Frequency: 5x/wk OT Frequency: 5x/wk            Contractures Contractures Info: Not present    Additional Factors Info  Code Status, Allergies, Psychotropic Code Status Info: Full Code Allergies Info: NKA Psychotropic Info: N/A         Current Medications (07/07/2024):  This is the current hospital active medication list Current Facility-Administered Medications  Medication Dose Route Frequency Provider Last Rate Last Admin   0.9 %  sodium chloride  infusion (Manually program via Guardrails IV Fluids)   Intravenous Once Alday, Stephen R, CRNA       acetaminophen  (TYLENOL ) tablet 650 mg  650 mg Oral Q6H PRN Patel, Vishal R, MD   650 mg at 07/04/24 9061   Or   acetaminophen  (TYLENOL ) suppository 650 mg  650 mg Rectal Q6H PRN Patel, Vishal R, MD       bisacodyl (DULCOLAX) EC tablet 5 mg  5 mg Oral Daily PRN Patel, Vishal R, MD   5 mg at 07/03/24 2029   Chlorhexidine Gluconate Cloth 2 % PADS 6 each  6 each Topical Daily Vernon Ranks, MD   6 each at 07/07/24 1317   feeding supplement (BOOST / RESOURCE BREEZE) liquid 1 Container  1 Container Oral TID BM Regalado, Belkys A, MD   1 Container at 07/07/24 1317   lactated ringers infusion   Intravenous Continuous Regalado, Belkys A, MD 75 mL/hr at 07/07/24 1313 New Bag at 07/07/24 1313   magnesium sulfate IVPB 2 g 50 mL  2 g Intravenous Once Regalado, Belkys A, MD 50 mL/hr at 07/07/24 1316 2 g at 07/07/24 1316   morphine  (PF) 2 MG/ML injection 1 mg  1 mg  Intravenous Q4H PRN Metzger, Gregory A, MD   1 mg at 07/02/24 1954   ondansetron  (ZOFRAN ) tablet 4 mg  4 mg Oral Q6H PRN Patel, Vishal R, MD       Or   ondansetron  (ZOFRAN ) injection 4 mg  4 mg Intravenous Q6H PRN Patel, Vishal R, MD   4 mg at 07/02/24 2022   Oral care mouth rinse  15 mL Mouth Rinse PRN Regalado, Belkys A, MD       pantoprazole (PROTONIX) EC tablet 40 mg  40 mg Oral BID Legge, Justin M, RPH   40 mg at 07/07/24 0839   polyethylene glycol (MIRALAX  / GLYCOLAX ) packet 17 g  17 g Oral Daily Mann, Jyothi, MD   17 g at 07/07/24 0839   potassium chloride SA (KLOR-CON M) CR tablet 40 mEq  40 mEq Oral Once Regalado, Belkys A, MD       pravastatin  (PRAVACHOL ) tablet 40 mg  40 mg Oral Daily Patel, Vishal R, MD   40 mg at 07/07/24 0839   senna-docusate (Senokot-S) tablet 1 tablet  1 tablet Oral QHS PRN Patel, Vishal R, MD   1 tablet at 07/03/24 2029   sodium chloride  flush (NS) 0.9 % injection 10-40 mL  10-40 mL Intracatheter Q12H Pahwani, Ranks, MD   10 mL at 07/07/24 1317   sodium chloride  flush (NS) 0.9 % injection 10-40 mL  10-40 mL Intracatheter PRN Vernon Ranks, MD       traMADol DANNY) tablet 50 mg  50 mg Oral Q6H PRN Metzger, Gregory A, MD   50 mg at 07/06/24 1434     Discharge Medications: Please see discharge summary for a list of discharge medications.  Relevant Imaging Results:  Relevant Lab Results:   Additional Information SSN: [id number]  Alfonse JONELLE Rex, RN

## 2024-07-07 NOTE — Progress Notes (Signed)
    5 Days Post-Op  Subjective: CC: No complaints  Objective: Vital signs in last 24 hours: Temp:  [97.4 F (36.3 C)-98.5 F (36.9 C)] 97.4 F (36.3 C) (10/21 0532) Pulse Rate:  [61-78] 61 (10/21 0532) Resp:  [15-18] 15 (10/21 0532) BP: (142-155)/(58-68) 155/68 (10/21 0532) SpO2:  [96 %-100 %] 100 % (10/21 0532) Last BM Date : 07/05/24  Intake/Output from previous day: 10/20 0701 - 10/21 0700 In: 960 [P.O.:960] Out: 1200 [Urine:1200] Intake/Output this shift: No intake/output data recorded.  PE: Gen:  Alert, NAD, pleasant Abd: Soft, mild distension, appropriately tender around incisions, no rigidity or guarding and otherwise NT, +BS. Midline incision, cdi. Laparoscopic incisions, cdi   Lab Results:  Recent Labs    07/05/24 0248 07/06/24 0230  WBC 7.6 6.8  HGB 7.5* 8.2*  HCT 25.6* 27.0*  PLT 312 298   BMET Recent Labs    07/05/24 0248  NA 133*  K 3.8  CL 101  CO2 23  GLUCOSE 109*  BUN <5*  CREATININE 0.55  CALCIUM 8.5*   PT/INR No results for input(s): LABPROT, INR in the last 72 hours. CMP     Component Value Date/Time   NA 133 (L) 07/05/2024 0248   NA 136 (A) 10/20/2020 0000   K 3.8 07/05/2024 0248   CL 101 07/05/2024 0248   CO2 23 07/05/2024 0248   GLUCOSE 109 (H) 07/05/2024 0248   BUN <5 (L) 07/05/2024 0248   BUN 16 10/20/2020 0000   CREATININE 0.55 07/05/2024 0248   CREATININE 0.71 02/25/2024 0809   CALCIUM 8.5 (L) 07/05/2024 0248   PROT 6.7 06/28/2024 1352   ALBUMIN 4.1 06/28/2024 1352   AST 18 06/28/2024 1352   ALT 10 06/28/2024 1352   ALKPHOS 68 06/28/2024 1352   BILITOT 0.6 06/28/2024 1352   GFRNONAA >60 07/05/2024 0248   GFRNONAA 74 03/08/2021 0910   GFRAA 85 03/08/2021 0910   Lipase  No results found for: LIPASE  Studies/Results: No results found.  Anti-infectives: Anti-infectives (From admission, onward)    Start     Dose/Rate Route Frequency Ordered Stop   07/02/24 1130  cefoTEtan (CEFOTAN) 2 g in sodium  chloride 0.9 % 100 mL IVPB        2 g 200 mL/hr over 30 Minutes Intravenous On call to O.R. 07/02/24 1033 07/02/24 1425        Assessment/Plan POD 5 Laparoscopic assisted right colectomy by Dr. Polly on 10/16 for Ascending colon mass  - Cont Soft diet - Hgb stable today at 8.2 - Pre-op CT CAP with no findings suspicious for metastatic disease.  - CEA 2.9 - Colonoscopy path w/ Adenocarcinoma, moderately differentiated  - Surgical path pending - Recommend Oncology consult to arrange follow up - Mobilize, pulm toilet as much as possible  Addendum: RN reports that patient had a BM last night vs early this morning and has been drinking okay. Plan soft diet in AM if doing well.    FEN: Soft diet, IVF per TRH VTE: scds, if hgb stable tomorrow she is okay for chem ppx from a general surgery standpoint ID: Cefotetan peri-op, none currently.    LOS: 5 days    Deward JINNY Foy, MD [facility] 07/07/2024, 9:25 AM Please see Amion for pager number during day hours 7:00am-4:30pm

## 2024-07-07 NOTE — Plan of Care (Signed)

## 2024-07-07 NOTE — Progress Notes (Signed)
 Mobility Specialist - Progress Note  During mobility: 68 BPM HR, 60/37 mmHg BP, 100% SpO2    07/07/24 1018  Mobility  Activity Pivoted/transferred to/from BSC  Level of Assistance Moderate assist, patient does 50-74%  Assistive Device BSC  Activity Response RN notified  Mobility Referral Yes  Mobility visit 1 Mobility  Mobility Specialist Start Time (ACUTE ONLY) 0935  Mobility Specialist Stop Time (ACUTE ONLY) 1015  Mobility Specialist Time Calculation (min) (ACUTE ONLY) 40 min   Pt was received in bed and agreed to mobility. Mod A from sit to stand onto [facility]. Pt became unresponsive on [facility], notified RN immediately. Primary care team in room and helped Pt back to bed. Rapid called.  [employer] - Mobility Specialist

## 2024-07-08 ENCOUNTER — Telehealth: Payer: Self-pay | Admitting: Family

## 2024-07-08 DIAGNOSIS — D649 Anemia, unspecified: Secondary | ICD-10-CM | POA: Diagnosis not present

## 2024-07-08 DIAGNOSIS — C189 Malignant neoplasm of colon, unspecified: Secondary | ICD-10-CM | POA: Insufficient documentation

## 2024-07-08 LAB — CBC
HCT: 24.7 % — ABNORMAL LOW (ref 36.0–46.0)
Hemoglobin: 7.2 g/dL — ABNORMAL LOW (ref 12.0–15.0)
MCH: 21.6 pg — ABNORMAL LOW (ref 26.0–34.0)
MCHC: 29.1 g/dL — ABNORMAL LOW (ref 30.0–36.0)
MCV: 74 fL — ABNORMAL LOW (ref 80.0–100.0)
Platelets: 230 K/uL (ref 150–400)
RBC: 3.34 MIL/uL — ABNORMAL LOW (ref 3.87–5.11)
RDW: 18.7 % — ABNORMAL HIGH (ref 11.5–15.5)
WBC: 7.8 K/uL (ref 4.0–10.5)
nRBC: 0 % (ref 0.0–0.2)

## 2024-07-08 LAB — BASIC METABOLIC PANEL WITH GFR
Anion gap: 7 (ref 5–15)
BUN: 9 mg/dL (ref 8–23)
CO2: 24 mmol/L (ref 22–32)
Calcium: 9 mg/dL (ref 8.9–10.3)
Chloride: 100 mmol/L (ref 98–111)
Creatinine, Ser: 0.81 mg/dL (ref 0.44–1.00)
GFR, Estimated: >60 mL/min (ref >60)
Glucose, Bld: 137 mg/dL — ABNORMAL HIGH (ref 70–99)
Potassium: 4.7 mmol/L (ref 3.5–5.1)
Sodium: 130 mmol/L — ABNORMAL LOW (ref 135–145)

## 2024-07-08 MED ORDER — SODIUM CHLORIDE 0.9 % IV SOLN: INTRAVENOUS | Status: AC

## 2024-07-08 MED ORDER — SODIUM CHLORIDE 0.9 % IV BOLUS
250.0000 mL | Freq: Once | INTRAVENOUS | Status: AC
Start: 2024-07-08 — End: 2024-07-08
  Administered 2024-07-08: 250 mL via INTRAVENOUS

## 2024-07-08 NOTE — Progress Notes (Signed)
 6 Days Post-Op  Subjective: CC: She reports no abdominal pain, n/v. Tolerating soft diet. Last BM 10/19. Voiding.   Objective: Vital signs in last 24 hours: Temp:  [97.3 F (36.3 C)-98.5 F (36.9 C)] 98.4 F (36.9 C) (10/22 0535) Pulse Rate:  [63-76] 67 (10/22 0535) Resp:  [15-18] 15 (10/22 0535) BP: (112-139)/(51-59) 132/54 (10/22 0535) SpO2:  [94 %-100 %] 97 % (10/22 0535) Last BM Date : 07/05/24  Intake/Output from previous day: 10/21 0701 - 10/22 0700 In: 2562.5 [P.O.:1320; I.V.:1192.5; IV Piggyback:50] Out: 750 [Urine:750] Intake/Output this shift: No intake/output data recorded.  PE: Gen:  Alert, NAD, pleasant Abd: Soft, ND, NT, midline incision with dermabond in place, cdi. Laparoscopic incisions with dressing in place, cdi.   Lab Results:  Recent Labs    07/07/24 1048 07/08/24 0052  WBC 7.4 7.8  HGB 8.0* 7.2*  HCT 27.8* 24.7*  PLT 283 230   BMET Recent Labs    07/07/24 1048 07/08/24 0052  NA 131* 130*  K 3.3* 4.7  CL 96* 100  CO2 23 24  GLUCOSE 135* 137*  BUN <5* 9  CREATININE 0.65 0.81  CALCIUM 8.6* 9.0   PT/INR No results for input(s): LABPROT, INR in the last 72 hours. CMP     Component Value Date/Time   NA 130 (L) 07/08/2024 0052   NA 136 (A) 10/20/2020 0000   K 4.7 07/08/2024 0052   CL 100 07/08/2024 0052   CO2 24 07/08/2024 0052   GLUCOSE 137 (H) 07/08/2024 0052   BUN 9 07/08/2024 0052   BUN 16 10/20/2020 0000   CREATININE 0.81 07/08/2024 0052   CREATININE 0.71 02/25/2024 0809   CALCIUM 9.0 07/08/2024 0052   PROT 6.7 06/28/2024 1352   ALBUMIN 4.1 06/28/2024 1352   AST 18 06/28/2024 1352   ALT 10 06/28/2024 1352   ALKPHOS 68 06/28/2024 1352   BILITOT 0.6 06/28/2024 1352   GFRNONAA >60 07/08/2024 0052   GFRNONAA 74 03/08/2021 0910   GFRAA 85 03/08/2021 0910   Lipase  No results found for: LIPASE  Studies/Results: No results found.  Anti-infectives: Anti-infectives (From admission, onward)    Start      Dose/Rate Route Frequency Ordered Stop   07/02/24 1130  cefoTEtan (CEFOTAN) 2 g in sodium chloride  0.9 % 100 mL IVPB        2 g 200 mL/hr over 30 Minutes Intravenous On call to O.R. 07/02/24 1033 07/02/24 1425        Assessment/Plan POD6 Laparoscopic assisted right colectomy by Dr. Polly on 10/16 for Ascending colon mass  - Cont Soft diet - Hgb 7.2 this AM from 8. HDS. No bloody BM's reported. Monitor.  - Pre-op CT CAP with no findings suspicious for metastatic disease.  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 Margins negative for carcinoma and dysplasia. Twelve lymph nodes, all negative for carcinoma (0/12).  - Recommend Oncology consult to arrange follow up - Mobilize, pulm toilet as much as possible - When hgb stable, okay for discharge to SNF from our standpoint. Discussed with TRH   FEN: Soft diet, IVF per TRH VTE: scds, if hgb stable tomorrow she is okay for chem ppx from a general surgery standpoint ID: Cefotetan peri-op, none currently.     LOS: 6 days    Amy Andrade, [facility] 07/08/2024, 8:40 AM Please see  Amion for pager number during day hours 7:00am-4:30pm

## 2024-07-08 NOTE — Progress Notes (Signed)
 Physical Therapy Treatment Patient Details Name: Amy Andrade MRN: [id number] DOB: Mar 18, 1936 Today's Date: 07/08/2024   History of Present Illness 88 yo females brought to ED 06/28/24 with weakness, anemia, FOBT, severe, B12 deficiency PMH includes Alzheimer dementia, asthma, chronic pain, HTN.chronic pain due to OA of both shoulders and chronic left shoulder dislocation.  1 unit PRBC on 10/12, EGD r cm hiatal hernia and lesions and colonoscopy 10/15 notable for non-obstructing colon mass, pt underwent laparoscopic assisted right colectomy on 07/02/2024    PT Comments  Pt was seated in recliner at start of treatment. She reported back pain seated at rest in recliner. Max assist for sit to stand from recliner pulling up on [place]. Pt required assist to reach BUEs onto bar of Stedy, pt reported B shoulder pain with movement, crepitus noted R shoulder with movement. +2 max assist for sit to supine. Pt performed bed level BLE exercises. Pain in B shoulders and abdomen are limiting progression of activity tolerance.    If plan is discharge home, recommend the following: Help with stairs or ramp for entrance;Assist for transportation;Direct supervision/assist for financial management;A lot of help with bathing/dressing/bathroom;Two people to help with walking and/or transfers   Can travel by private vehicle        Equipment Recommendations  Hospital bed    Recommendations for Other Services       Precautions / Restrictions Precautions Precautions: Fall Recall of Precautions/Restrictions: Impaired Precaution/Restrictions Comments: severe  bilateral shoulder AROM impaired, 0-60 degrees FF; significant crepitus noted with R shoulder movement; pt not retaining information presented to her such as how to use the call bell Restrictions Weight Bearing Restrictions Per Provider Order: No     Mobility  Bed Mobility Overal bed mobility: Needs Assistance Bed Mobility: Sit to Supine       Sit  to supine: Max assist, +2 for physical assistance, +2 for safety/equipment   General bed mobility comments: assist to control trunk descnet and bring BLEs into bed    Transfers Overall transfer level: Needs assistance Equipment used: Rolling walker (2 wheels) Transfers: Sit to/from Stand, Bed to chair/wheelchair/BSC Sit to Stand: Max assist, Via lift equipment           General transfer comment: max assist to power up from recliner; recliner to bed transfer with [employer] via Lift Equipment: Stedy  Ambulation/Gait                   Stairs             Wheelchair Mobility     Tilt Bed    Modified Rankin (Stroke Patients Only)       Balance Overall balance assessment: Needs assistance Sitting-balance support: Bilateral upper extremity supported, Feet supported Sitting balance-Leahy Scale: Good     Standing balance support: Bilateral upper extremity supported, During functional activity Standing balance-Leahy Scale: Poor                              Communication Communication Communication: No apparent difficulties  Cognition Arousal: Alert Behavior During Therapy: WFL for tasks assessed/performed, Anxious   PT - Cognitive impairments: History of cognitive impairments, No family/caregiver present to determine baseline                       PT - Cognition Comments: pt not retaining information presented such as how to use the call bell and how to use the  purewick Following commands: Intact Following commands impaired: Follows one step commands with increased time    Cueing Cueing Techniques: Gestural cues, Tactile cues, Verbal cues  Exercises General Exercises - Lower Extremity Ankle Circles/Pumps: AROM, Both, 15 reps, Supine Short Arc Quad: AROM, Both, 10 reps, Supine Heel Slides: AAROM, Both, 10 reps, Supine    General Comments        Pertinent Vitals/Pain Pain Assessment Faces Pain Scale: Hurts whole lot Pain  Location: abdomen, back and B shoulders (R more than L) with movement Pain Descriptors / Indicators: Grimacing, Guarding, Operative site guarding Pain Intervention(s): Limited activity within patient's tolerance, Monitored during session, Repositioned, Patient requesting pain meds-RN notified    Home Living                          Prior Function            PT Goals (current goals can now be found in the care plan section) Acute Rehab PT Goals Patient Stated Goal: patient to be able to stand and transfer PT Goal Formulation: With family Time For Goal Achievement: 07/14/24 Potential to Achieve Goals: Good Progress towards PT goals: Not progressing toward goals - comment (pain/fatigue limiting progression)    Frequency    Min 2X/week      PT Plan      Co-evaluation              AM-PAC PT 6 Clicks Mobility   Outcome Measure  Help needed turning from your back to your side while in a flat bed without using bedrails?: A Lot Help needed moving from lying on your back to sitting on the side of a flat bed without using bedrails?: A Lot Help needed moving to and from a bed to a chair (including a wheelchair)?: Total Help needed standing up from a chair using your arms (e.g., wheelchair or bedside chair)?: A Lot Help needed to walk in hospital room?: Total Help needed climbing 3-5 steps with a railing? : Total 6 Click Score: 9    End of Session Equipment Utilized During Treatment: Gait belt Activity Tolerance: Patient limited by pain;Patient limited by fatigue Patient left: in bed;with bed alarm set;with call bell/phone within reach Nurse Communication: Mobility status;Need for lift equipment PT Visit Diagnosis: Unsteadiness on feet (R26.81);Pain;Difficulty in walking, not elsewhere classified (R26.2);Muscle weakness (generalized) (M62.81)     Time: 8677-8656 PT Time Calculation (min) (ACUTE ONLY): 21 min  Charges:    $Therapeutic Activity: 8-22  mins PT General Charges $$ ACUTE PT VISIT: 1 Visit                     Arman Delon Copp PT 07/08/2024  Acute Rehabilitation Services  Office [phone]

## 2024-07-08 NOTE — Consult Note (Addendum)
 ADDENDUM:  Patient was personally and independently interviewed, examined and relevant elements of the history of present illness were reviewed in details and an assessment and plan was created. All elements of the patient's history of present illness, assessment and plan were discussed in detail with Olam JINNY Brunner, NP. The above documentation reflects our combined findings assessment and plan.   Briefly, 88 year old lady who was admitted with generalized weakness and fatigue and was found to have symptomatic anemia.  Hemoglobin was decreased at 7.2.  Found to have iron deficiency and vitamin B12 deficiency.  She had GI workup with EGD and colonoscopy on 07/01/2024.  EGD showed evidence of large hiatal hernia with a couple of ?Cameron lesions.  No other abnormalities.  On colonoscopy, malignant appearing 5 cm infiltrative mass in the proximal ascending colon was noted and was biopsied.  Pathology came back positive for moderately differentiated adenocarcinoma.  MMR intact.  On 07/02/2024, staging CT chest, abdomen and pelvis showed fluid in the vaginal apex, new from recent prior.  Consider GYN evaluation.  Otherwise unremarkable CT scan.  On 07/02/2024, she underwent right hemicolectomy.  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  Margins were negative for carcinoma and dysplasia.  No LVI or PNI.  12 lymph nodes were removed and were negative for malignancy.  Grade 3. pT3,pN0,cM0, Stage II A disease.   CEA was within normal limits at 2.9 preoperatively.  Patient does have Alzheimer's dementia.  Given stage II A disease, without high risk features, we can continue surveillance alone as per NCCN guidelines, especially considering her medical comorbidities, functional status and dementia issues.   I will plan to see her in clinic for follow-up and continuation of surveillance.  Rest of the management is as  per primary team.  Transfuse as needed to maintain hemoglobin above 7.  Continue iron and B12 supplementation.  Please call us  with any questions.  [facility] CONSULT NOTE  Patient Care Team: Ngetich, Roxan BROCKS, NP as PCP - General (Family Medicine) Cyrus Carwin, MD as Consulting Physician (Ophthalmology)  CHIEF COMPLAINTS/PURPOSE OF CONSULTATION:  Colon Adeno  REFERRING PHYSICIAN: Dr. Madelyne  HISTORY OF PRESENTING ILLNESS:  Amy Andrade 88 y.o. female was admitted on 06/28/2024 with complaints of several days of generalized weakness and fatigue.  Also complaints of frequent urinary urgency for which she was recently treated with Cipro . Workup has been done during this hospitalization and patient found to have colon adenocarcinoma.  Therefore oncology evaluation has been requested. Patient is seen awake and alert sitting up in chair at bedside.  She is very pleasant.  However she is a poor historian and is unable to answer any medical related questions and states I do not remember.  Admits at this time to back pain only, denies abdominal pain.  Admits to being unable to ambulate without assistance.  Asked that we call her son [name] for any medical questions. Medical history is taken from documentation.  Medical history includes Alzheimer's dementia, hypertension, and hyperlipidemia. Surgical history includes hysterectomy, bunionectomy, and total knee replacement. Family oncologic history is noncontributory. Social history: Patient did answer these questions herself stating that she stopped smoking tobacco 50 years ago when her then husband could not stand it.  Admits to social wine drinker in the past.  Denies drug use.  Worked as a Economist and a Oceanographer.   I have reviewed her chart and materials related to her cancer extensively  and collaborated history with the patient. Summary of oncologic history is as follows: Oncology History   No history  exists.    ASSESSMENT & PLAN:  Colon adenocarcinoma - Biopsy of proximal ascending mass of colon done 07/01/2024 shows adenocarcinoma moderately differentiated.   -- Right hemicolectomy done and showed invasive mucinous adenocarcinoma, moderately to poorly differentiated.  12 lymph nodes were negative for carcinoma. - Pre-op CT imaging 10/12 and 10/16 did not show findings suspicious for malignancy. - Tumor markers CEA done 07/02/2024 was WNL 2.9. - Medical oncology/Dr. Autumn will make further evaluation and treatment recommendations as appropriate.  Anemia Iron deficiency anemia B12 deficiency - Hemoglobin very low 7.2.  Has received PRBC transfusion this admission. - Recommend PRBC transfusion for hemoglobin <7.0 -- Ferritin low 21 with sat 14% on 06/28/24. Received IV iron repletion x1.  -- B12 low <150, continue repletion as ordered.  - Continue to monitor CBC with differential  Alzheimer's - Baseline - Continue supportive care  Hypertension Hyperlipidemia - On statin - Continue to monitor blood pressure closely   MEDICAL HISTORY:  Past Medical History:  Diagnosis Date   Alzheimer disease (HCC)    Asthma    Cancer (HCC)    Hyperlipidemia    Hypertension    Pulmonary embolism (HCC) 2004   was short of breath--was on coumadin for 6 mos to a year    SURGICAL HISTORY: Past Surgical History:  Procedure Laterality Date   ABDOMINAL HYSTERECTOMY     BUNIONECTOMY     CATARACT EXTRACTION Bilateral 06/17/2022   COLONOSCOPY Left 07/01/2024   Procedure: COLONOSCOPY;  Surgeon: Kristie Lamprey, MD;  Location: [facility];  Service: Gastroenterology;  Laterality: Left;  IDA   ESOPHAGOGASTRODUODENOSCOPY Left 07/01/2024   Procedure: EGD (ESOPHAGOGASTRODUODENOSCOPY);  Surgeon: Kristie Lamprey, MD;  Location: [facility];  Service: Gastroenterology;  Laterality: Left;  IDA   LAPAROSCOPIC RIGHT COLECTOMY N/A 07/02/2024   Procedure: COLECTOMY, RIGHT, LAPAROSCOPIC;  Surgeon: Polly Cordella LABOR, MD;  Location: [facility];  Service: General;  Laterality: N/A;   REPLACEMENT TOTAL KNEE  1980s, 2003   x2    SOCIAL HISTORY: Social History   Socioeconomic History   Marital status: Widowed    Spouse name: Not on file   Number of children: 2   Years of education: Not on file   Highest education level: Not on file  Occupational History   Not on file  Tobacco Use   Smoking status: Former    Current packs/day: 0.00    Average packs/day: 0.3 packs/day for 4.0 years (1.0 ttl pk-yrs)    Types: Cigarettes    Start date: 09/17/1953    Quit date: 09/17/1957    Years since quitting: 66.8   Smokeless tobacco: Never  Vaping Use   Vaping status: Never Used  Substance and Sexual Activity   Alcohol use: Yes    Alcohol/week: 2.0 standard drinks of alcohol    Types: 2 Glasses of wine per week    Comment: 1 4oz cup with lunch and dinner   Drug use: No   Sexual activity: Not Currently  Other Topics Concern   Not on file  Social History Narrative   Diet: Eats well      Do you drink/ eat things with caffeine?Yes, 2 cups of coffee a day      Marital status:    Married--widowed in 2017  What year were you married ? 1966      Do you live in a house, apartment,assistred living, condo, trailer, etc.)? House      Is it one or more stories? 2      How many persons live in your home ?  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  Yes                            Type & how often: Stationary bike 2 x/week      Do you have a living will? Yes      Do you have a DNR form?  Yes                     If not, do you want to discuss one?       Do you have signed POA?HPOA forms?   Yes              If so, please bring to your        appointment      Social Drivers of Health   Financial Resource Strain: Low Risk  (11/18/2017)   Overall Financial Resource Strain (CARDIA)    Difficulty  of Paying Living Expenses: Not hard at all  Food Insecurity: No Food Insecurity (06/28/2024)   Hunger Vital Sign    Worried About Running Out of Food in the Last Year: Never true    Ran Out of Food in the Last Year: Never true  Transportation Needs: No Transportation Needs (06/28/2024)   PRAPARE - Administrator, Civil Service (Medical): No    Lack of Transportation (Non-Medical): No  Physical Activity: Inactive (11/18/2017)   Exercise Vital Sign    Days of Exercise per Week: 0 days    Minutes of Exercise per Session: 0 min  Stress: No Stress Concern Present (11/18/2017)   [employer] of Occupational Health - Occupational Stress Questionnaire    Feeling of Stress : Only a little  Social Connections: Socially Isolated (06/28/2024)   Social Connection and Isolation Panel    Frequency of Communication with Friends and Family: More than three times a week    Frequency of Social Gatherings with Friends and Family: More than three times a week    Attends Religious Services: Never    Database administrator or Organizations: No    Attends Banker Meetings: Never    Marital Status: Widowed  Intimate Partner Violence: Not At Risk (06/28/2024)   Humiliation, Afraid, Rape, and Kick questionnaire    Fear of Current or Ex-Partner: No    Emotionally Abused: No    Physically Abused: No    Sexually Abused: No    FAMILY HISTORY: Family History  Problem Relation Age of Onset   Alzheimer's disease Mother    Chronic bronchitis Mother    Heart disease Father    High blood pressure Father    High blood pressure Brother    Asthma Daughter    High blood pressure Son    High Cholesterol Son    Post-traumatic stress disorder Son      PHYSICAL EXAMINATION: ECOG PERFORMANCE STATUS: 3 - Symptomatic, >50% confined to bed  Vitals:   07/08/24 0901 07/08/24 0906  BP: ROLLEN)  131/56 (!) 123/56  Pulse: 67 80  Resp: 16 18  Temp: 98.4 F (36.9 C) 98.4 F (36.9 C)  SpO2: 98%  98%   Filed Weights   06/28/24 2343 07/02/24 1240 07/05/24 0542  Weight: 176 lb 5.9 oz (80 kg) 176 lb 5.9 oz (80 kg) 174 lb 13.2 oz (79.3 kg)    GENERAL: alert, no distress and comfortable +elderly SKIN: +pale skin color, texture, turgor are normal, no rashes or significant lesions EYES: normal, conjunctiva are pink and non-injected, sclera clear OROPHARYNX: no exudate, no erythema and lips, buccal mucosa, and tongue normal  NECK: supple, thyroid  normal size, non-tender, without nodularity LYMPH: no palpable lymphadenopathy in the cervical, axillary or inguinal LUNGS: clear to auscultation and percussion with normal breathing effort HEART: regular rate & rhythm and no murmurs and no lower extremity edema ABDOMEN: abdomen soft, non-tender and normal bowel sounds MUSCULOSKELETAL: +ambulate with assist and walker  PSYCH: +altered mental status NEURO: no focal motor/sensory deficits   ALLERGIES:  has no known allergies.  MEDICATIONS:  Current Facility-Administered Medications  Medication Dose Route Frequency Provider Last Rate Last Admin   0.9 %  sodium chloride  infusion (Manually program via Guardrails IV Fluids)   Intravenous Once Alday, Stephen R, CRNA       0.9 %  sodium chloride  infusion   Intravenous Continuous Odell Celinda Balo, MD       acetaminophen  (TYLENOL ) tablet 650 mg  650 mg Oral Q6H PRN Patel, Vishal R, MD   650 mg at 07/04/24 9061   Or   acetaminophen  (TYLENOL ) suppository 650 mg  650 mg Rectal Q6H PRN Patel, Vishal R, MD       bisacodyl (DULCOLAX) EC tablet 5 mg  5 mg Oral Daily PRN Patel, Vishal R, MD   5 mg at 07/03/24 2029   Chlorhexidine Gluconate Cloth 2 % PADS 6 each  6 each Topical Daily Vernon Ranks, MD   6 each at 07/07/24 1317   feeding supplement (BOOST / RESOURCE BREEZE) liquid 1 Container  1 Container Oral TID BM Regalado, Belkys A, MD   1 Container at 07/07/24 2059   lactated ringers infusion   Intravenous Continuous Regalado, Belkys A, MD 75 mL/hr at  07/08/24 0340 New Bag at 07/08/24 0340   morphine  (PF) 2 MG/ML injection 1 mg  1 mg Intravenous Q4H PRN Metzger, Gregory A, MD   1 mg at 07/02/24 1954   ondansetron  (ZOFRAN ) tablet 4 mg  4 mg Oral Q6H PRN Patel, Vishal R, MD       Or   ondansetron  (ZOFRAN ) injection 4 mg  4 mg Intravenous Q6H PRN Patel, Vishal R, MD   4 mg at 07/02/24 2022   Oral care mouth rinse  15 mL Mouth Rinse PRN Regalado, Belkys A, MD       pantoprazole (PROTONIX) EC tablet 40 mg  40 mg Oral BID Legge, Justin M, RPH   40 mg at 07/07/24 2100   polyethylene glycol (MIRALAX  / GLYCOLAX ) packet 17 g  17 g Oral Daily Mann, Jyothi, MD   17 g at 07/07/24 0839   pravastatin  (PRAVACHOL ) tablet 40 mg  40 mg Oral Daily Patel, Vishal R, MD   40 mg at 07/07/24 0839   senna-docusate (Senokot-S) tablet 1 tablet  1 tablet Oral QHS PRN Patel, Vishal R, MD   1 tablet at 07/03/24 2029   sodium chloride  0.9 % bolus 250 mL  250 mL Intravenous Once Odell Celinda Balo, MD  sodium chloride  flush (NS) 0.9 % injection 10-40 mL  10-40 mL Intracatheter Q12H Vernon Ranks, MD   10 mL at 07/07/24 2103   sodium chloride  flush (NS) 0.9 % injection 10-40 mL  10-40 mL Intracatheter PRN Vernon Ranks, MD       traMADol DANNY) tablet 50 mg  50 mg Oral Q6H PRN Polly Cordella LABOR, MD   50 mg at 07/06/24 1434     LABORATORY DATA:  I have reviewed the data as listed Lab Results  Component Value Date   WBC 7.8 07/08/2024   HGB 7.2 (L) 07/08/2024   HCT 24.7 (L) 07/08/2024   MCV 74.0 (L) 07/08/2024   PLT 230 07/08/2024   Recent Labs    02/25/24 0809 06/28/24 1352 06/29/24 0524 07/05/24 0248 07/07/24 1048 07/08/24 0052  NA 131* 131*   < > 133* 131* 130*  K 4.7 4.4   < > 3.8 3.3* 4.7  CL 97* 97*   < > 101 96* 100  CO2 26 23   < > 23 23 24   GLUCOSE 91 108*   < > 109* 135* 137*  BUN 10 23   < > <5* <5* 9  CREATININE 0.71 1.09*   < > 0.55 0.65 0.81  CALCIUM 9.2 9.6   < > 8.5* 8.6* 9.0  GFRNONAA  --  49*   < > >60 >60 >60  PROT 6.0* 6.7   --   --   --   --   ALBUMIN  --  4.1  --   --   --   --   AST 11 18  --   --   --   --   ALT 7 10  --   --   --   --   ALKPHOS  --  68  --   --   --   --   BILITOT 0.5 0.6  --   --   --   --    < > = values in this interval not displayed.    RADIOGRAPHIC STUDIES: I have personally reviewed the radiological images as listed and agreed with the findings in the report. CT CHEST ABDOMEN PELVIS W CONTRAST Result Date: 07/02/2024 EXAM: CT CHEST, ABDOMEN AND PELVIS WITH CONTRAST 07/02/2024 11:22:16 AM TECHNIQUE: CT of the chest, abdomen and pelvis was performed with the administration of 100 mL (iohexol (OMNIPAQUE) 300 MG/ML solution 100 mL IOHEXOL 300 MG/ML SOLN) intravenous contrast. Multiplanar reformatted images are provided for review. Automated exposure control, iterative reconstruction, and/or weight based adjustment of the mA/kV was utilized to reduce the radiation dose to as low as reasonably achievable. COMPARISON: 06/28/2024 CLINICAL HISTORY: Metastatic disease evaluation. Per Dr.Pahwani notes:; Brief Narrative: ; ISABELLE MATT is a 88 y.o. female with medical history significant for Alzheimer's dementia, HTN, HLD, chronic pain due to OA of both shoulders and chronic left shoulder dislocation who presented to the ED for evaluation of generalized weakness and ; no other complaint. She notes frequent urinary urgency. She was recently treated for UTI initially with ciprofloxacin . Urine culture grew Enterococcus faecalis and antibiotics were switched to nitrofurantoin  which she has since completed FINDINGS: CHEST: MEDIASTINUM AND LYMPH NODES: Heart and pericardium are unremarkable. The central airways are clear. No mediastinal, hilar or axillary lymphadenopathy. Mild thoracic aortic atherosclerosis. Mild coronary atherosclerosis of the LAD and right coronary artery. LUNGS AND PLEURA: No focal consolidation or pulmonary edema. No pleural effusion or pneumothorax. Mild scarring in the bilateral lower  lobes.  ABDOMEN AND PELVIS: LIVER: The liver is unremarkable. GALLBLADDER AND BILE DUCTS: Gallbladder is unremarkable. No biliary ductal dilatation. SPLEEN: No acute abnormality. PANCREAS: No acute abnormality. ADRENAL GLANDS: No acute abnormality. KIDNEYS, URETERS AND BLADDER: No stones in the kidneys or ureters. No hydronephrosis. No perinephric or periureteral stranding. Urinary bladder is unremarkable. GI AND BOWEL: Stomach demonstrates no acute abnormality. There is no bowel obstruction. REPRODUCTIVE ORGANS: Status post hysterectomy with new fluid within the vaginal apex (image 101). PERITONEUM AND RETROPERITONEUM: No ascites. No free air. VASCULATURE: Aorta is normal in caliber. Atherosclerotic calcifications of the abdominal aorta and branch vessels, although patent. ABDOMINAL AND PELVIS LYMPH NODES: No lymphadenopathy. BONES AND SOFT TISSUES: Severe degenerative changes of the right shoulder. Mild degenerative changes of the left shoulder with anterior subluxation. Degenerative changes of the visualized thoracolumbar spine. Status post L4-L5 PLIF. S-shaped thoracolumbar scoliosis. No acute osseous abnormality. No focal soft tissue abnormality. IMPRESSION: 1. Fluid in the vaginal apex, new from recent prior. Consider GYN evaluation. 2. Otherwise unchanged from recent CT. Chronic ancillary findings as above. 3. No findings suspicious for primary malignancy or metastatic disease. Electronically signed by: Pinkie Pebbles MD 07/02/2024 08:23 PM EDT RP Workstation: [id number]   CT CHEST ABDOMEN PELVIS WO CONTRAST Result Date: 06/28/2024 CLINICAL DATA:  Polytrauma, blunt. Recently treated UTI. Low back pain. History of dementia. EXAM: CT CHEST, ABDOMEN AND PELVIS WITHOUT CONTRAST TECHNIQUE: Multidetector CT imaging of the chest, abdomen and pelvis was performed following the standard protocol without IV contrast. RADIATION DOSE REDUCTION: This exam was performed according to the departmental dose-optimization  program which includes automated exposure control, adjustment of the mA and/or kV according to patient size and/or use of iterative reconstruction technique. COMPARISON:  None Available. FINDINGS: CT CHEST FINDINGS Cardiovascular: The heart is mildly enlarged and there is a trace pericardial effusion. Scattered coronary artery calcifications are noted. There is atherosclerotic calcification of the aorta without evidence of aneurysm. The pulmonary trunk is normal in caliber. Mediastinum/Nodes: No mediastinal or axillary lymphadenopathy. Evaluation of the hila is limited due to lack of IV contrast. The trachea and esophagus are within normal limits. There is a small hiatal hernia. Lungs/Pleura: Atelectasis/scarring is present bilaterally. No effusion or pneumothorax is seen. Musculoskeletal: A fat containing Bochdalek hernia is noted on the left. Degenerative changes are present in the thoracic spine. Destructive and degenerative changes are present at the glenohumeral joints bilaterally with a chronic subluxation. A joint effusion is present on the left. There is kyphosis of the thoracic spine. No acute fracture is seen. CT ABDOMEN PELVIS FINDINGS Hepatobiliary: No focal liver abnormality is seen. No gallstones, gallbladder wall thickening, or biliary dilatation. Pancreas: Unremarkable. No pancreatic ductal dilatation or surrounding inflammatory changes. Spleen: Normal in size without focal abnormality. Adrenals/Urinary Tract: The adrenal glands are within normal limits. No renal calculus or hydronephrosis bilaterally. The bladder is unremarkable. Stomach/Bowel: There is a small hiatal hernia. No bowel obstruction, free air, or pneumatosis is seen. A moderate amount of retained stool is noted in the colon. Appendix is not seen. Vascular/Lymphatic: Aortic atherosclerosis. No abdominal or pelvic lymphadenopathy. Reproductive: Status post hysterectomy. No adnexal masses. Other: No abdominopelvic ascites. A fat  containing umbilical hernia is present. Musculoskeletal: Degenerative changes are noted in the lumbar spine and bilateral hips. There is ankylosis of the the T11 through L3 vertebral bodies. Spinal fusion hardware is noted at L4-L5. No acute fracture is seen. IMPRESSION: 1. No renal calculus or obstructive uropathy bilaterally. 2. Multilevel degenerative changes in the thoracolumbar spine without  evidence of acute fracture. 3. Moderate amount of retained stool in the colon suggesting constipation. 4. Small hiatal hernia. 5. Aortic atherosclerosis and coronary artery calcifications. Electronically Signed   By: Leita Birmingham M.D.   On: 06/28/2024 16:02   CT T-SPINE NO CHARGE Result Date: 06/28/2024 EXAM: CT THORACIC SPINE WITHOUT CONTRAST 06/28/2024 03:30:01 PM TECHNIQUE: CT of the thoracic spine was performed without the administration of intravenous contrast. Multiplanar reformatted images are provided for review. Automated exposure control, iterative reconstruction, and/or weight based adjustment of the mA/kV was utilized to reduce the radiation dose to as low as reasonably achievable. COMPARISON: None available. CLINICAL HISTORY: Recently treated UTI with symptom improvement, now symptoms have returned of lower back pain, polyuria but only drops come out. Hx dementia, son at bedside. FINDINGS: BONES AND ALIGNMENT: Normal vertebral body heights. Remote superior endplate fracture is present at T8. No acute fracture or suspicious bone lesion. Exaggerated cervical thoracic kyphosis was present. Rightward curvature centered at T11. DEGENERATIVE CHANGES: Ankylosis is present across the disc spaces at T11-T12 and T12-L1. SOFT TISSUES: No acute abnormality. IMPRESSION: 1. No acute findings. 2. Exaggerated cervicothoracic kyphosis. 3. Rightward curvature centered at T11. 4. Ankylosis across the disc spaces at T11-12 and T12-L1. 5. Remote superior endplate fracture at T8. Electronically signed by: Lonni Necessary MD  06/28/2024 03:48 PM EDT RP Workstation: [id number]   CT L-SPINE NO CHARGE Result Date: 06/28/2024 EXAM: CT OF THE LUMBAR SPINE WITHOUT CONTRAST 06/28/2024 03:30:01 PM TECHNIQUE: CT of the lumbar spine was performed without the administration of intravenous contrast. Multiplanar reformatted images are provided for review. Automated exposure control, iterative reconstruction, and/or weight based adjustment of the mA/kV was utilized to reduce the radiation dose to as low as reasonably achievable. COMPARISON: MRI of the lumbar spine 07/28/2024. CLINICAL HISTORY: Recently treated UTI with symptom improvement, now symptoms have returned of lower back pain, polyuria but only drops come out. Hx dementia, son at bedside. FINDINGS: BONES AND ALIGNMENT: Normal vertebral body heights. Solid fusion is present at L4-L5. An ankylosis of lower thoracic spine extends to L3. Chronic sclerotic changes are present at L3-L4. No acute fracture or suspicious bone lesion. Normal alignment. DEGENERATIVE CHANGES: Moderate central and right greater than left foraminal stenosis is present at L3-L4. Moderate left foraminal stenosis is present at L5-S1. SOFT TISSUES: Atherosclerotic changes are present in the aorta and branch vessels without aneurysm. A small hiatal hernia is present. No acute abnormality. IMPRESSION: 1. No acute findings. 2. Solid fusion at L4-5 and ankylosis of lower thoracic spine extending to L3. 3. Chronic sclerotic changes at L3-4 with moderate central and right greater than left foraminal stenosis. 4. Moderate left foraminal stenosis at L5-S1. Electronically signed by: Lonni Necessary MD 06/28/2024 03:46 PM EDT RP Workstation: [id number]   CT CERVICAL SPINE WO CONTRAST Result Date: 06/28/2024 EXAM: CT CERVICAL SPINE WITHOUT CONTRAST 06/28/2024 03:30:01 PM TECHNIQUE: CT of the cervical spine was performed without the administration of intravenous contrast. Multiplanar reformatted images are provided for review.  Automated exposure control, iterative reconstruction, and/or weight based adjustment of the mA/kV was utilized to reduce the radiation dose to as low as reasonably achievable. COMPARISON: CT of the cervical spine 08/10/2008. CLINICAL HISTORY: Polytrauma, blunt. Recently treated UTI with symptom improvement, now symptoms have returned of lower back pain, polyuria but only drops come out. Hx dementia, son at bedside. FINDINGS: CERVICAL SPINE: BONES AND ALIGNMENT: Straightening of the normal cervical lordosis is again noted. Ankylosis is present across the disc spaces C2-C6. No acute fracture  or traumatic malalignment. DEGENERATIVE CHANGES: Ankylosis is present across the disc spaces C2-C6. SOFT TISSUES: No prevertebral soft tissue swelling. IMPRESSION: 1. No acute abnormality of the cervical spine. 2. Ankylosis across the C2C6 disc spaces. Electronically signed by: Lonni Necessary MD 06/28/2024 03:42 PM EDT RP Workstation: [id number]   CT HEAD WO CONTRAST Result Date: 06/28/2024 EXAM: CT HEAD WITHOUT CONTRAST 06/28/2024 03:30:01 PM TECHNIQUE: CT of the head was performed without the administration of intravenous contrast. Automated exposure control, iterative reconstruction, and/or weight based adjustment of the mA/kV was utilized to reduce the radiation dose to as low as reasonably achievable. COMPARISON: CT head without contrast 08/31/2020. CLINICAL HISTORY: Head trauma, moderate-severe. Recently treated UTI with symptom improvement, now symptoms have returned of lower back pain, polyuria but only drops come out. Hx dementia, son at bedside. FINDINGS: BRAIN AND VENTRICLES: No acute hemorrhage. No evidence of acute infarct. No hydrocephalus. No extra-axial collection. No mass effect or midline shift. Moderate generalized atrophy and white matter changes are similar to the prior study. Minimal atherosclerotic changes are present within the cavernous internal carotid arteries bilaterally. No hyperdense vessel  is present. ORBITS: Bilateral lens replacements are noted. The globes and orbits are otherwise within normal limits. SINUSES: No acute abnormality. SOFT TISSUES AND SKULL: No acute soft tissue abnormality. No skull fracture. IMPRESSION: 1. No acute intracranial abnormality. 2. Moderate generalized atrophy and white matter changes, similar to the prior study. Electronically signed by: Lonni Necessary MD 06/28/2024 03:40 PM EDT RP Workstation: [id number]     The total time spent in the appointment was 40 minutes encounter with patients including review of chart and various tests results, discussions about plan of care and coordination of care plan   All questions were answered. The patient knows to call the clinic with any problems, questions or concerns. No barriers to learning was detected.  Olam PARAS Rouson, NP 10/22/202510:09 AM

## 2024-07-08 NOTE — Discharge Instructions (Signed)
 [facility]      [facility], [place] [phone]  OPEN ABDOMINAL SURGERY: POST OP INSTRUCTIONS  Always review your discharge instruction sheet given to you by the facility where your surgery was performed.  IF YOU HAVE DISABILITY OR FAMILY LEAVE FORMS, YOU MUST BRING THEM TO THE OFFICE FOR PROCESSING.  PLEASE DO NOT GIVE THEM TO YOUR DOCTOR.  A prescription for pain medication may be given to you upon discharge.  Take your pain medication as prescribed, if needed.  If narcotic pain medicine is not needed, then you may take acetaminophen  (Tylenol ) or ibuprofen (Advil) as needed. Take your usually prescribed medications unless otherwise directed. If you need a refill on your pain medication, please contact your pharmacy. They will contact our office to request authorization.  Prescriptions will not be filled after 5pm or on week-ends. You should follow a light diet the first few days after arrival home, such as soup and crackers, pudding, etc.unless your doctor has advised otherwise. A high-fiber, low fat diet can be resumed as tolerated.   Be sure to include lots of fluids daily. Most patients will experience some swelling and bruising on the chest and neck area.  Ice packs will help.  Swelling and bruising can take several days to resolve Most patients will experience some swelling and bruising in the area of the incision. Ice pack will help. Swelling and bruising can take several days to resolve..  It is common to experience some constipation if taking pain medication after surgery.  Increasing fluid intake and taking a stool softener will usually help or prevent this problem from occurring.  A mild laxative (Milk of Magnesia or Miralax) should be taken according to package directions if there are no bowel movements after 48 hours.  You may have steri-strips (small skin tapes) in place directly over the incision.  These strips should be left on the skin for 7-10 days.  If your surgeon used skin  glue on the incision, you may shower in 24 hours.  The glue will flake off over the next 2-3 weeks.  Any sutures or staples will be removed at the office during your follow-up visit. You may find that a light gauze bandage over your incision may keep your staples from being rubbed or pulled. You may shower and replace the bandage daily. ACTIVITIES:  You may resume regular (light) daily activities beginning the next day--such as daily self-care, walking, climbing stairs--gradually increasing activities as tolerated.  You may have sexual intercourse when it is comfortable.  Refrain from any heavy lifting or straining until approved by your doctor. You may drive when you no longer are taking prescription pain medication, you can comfortably wear a seatbelt, and you can safely maneuver your car and apply brakes Return to Work: ___________________________________ Rosine should see your doctor in the office for a follow-up appointment approximately two weeks after your surgery.  Make sure that you call for this appointment within a day or two after you arrive home to insure a convenient appointment time. OTHER INSTRUCTIONS:  _____________________________________________________________ _____________________________________________________________  WHEN TO CALL YOUR DOCTOR: Fever over 101.0 Inability to urinate Nausea and/or vomiting Extreme swelling or bruising Continued bleeding from incision. Increased pain, redness, or drainage from the incision. Difficulty swallowing or breathing Muscle cramping or spasms. Numbness or tingling in hands or feet or around lips.  The clinic staff is available to answer your questions during regular business hours.  Please don't hesitate to call and ask to speak to one of  the nurses if you have concerns.  For further questions, please visit [email or web address]

## 2024-07-08 NOTE — TOC Progression Note (Addendum)
 Transition of Care [facility]) - Progression Note    Patient Details  Name: Amy Andrade MRN: [id number] Date of Birth: [birth year]  [facility] CM/SW Contact  Alfonse JONELLE Rex, RN Phone Number: 07/08/2024, 4:19 PM  Clinical Narrative:    PT recommendation changed to  short term rehab/SNF, call to patient's son, [name], introduced self and review for dc planning, [name] agreeable, prefers [facility]. FL2 updated, faxed out for bed offers.   -4:00pm Call to patient's son to review SNF bed offers ( [facility], [facility]), accepted bed offer at [facility]. SNF auth initiated via [name] ID: [id number], barrows pending.      Expected Discharge Plan: Home w Home Health Services Barriers to Discharge: Continued Medical Work up               Expected Discharge Plan and Services   Discharge Planning Services: CM Consult Post Acute Care Choice: Home Health, Durable Medical Equipment                   DME Arranged: Hospital bed DME Agency: [employer] Date DME Agency Contacted: 07/01/24     HH Arranged: PT, OT           Social Drivers of Health (SDOH) Interventions SDOH Screenings   Food Insecurity: No Food Insecurity (06/28/2024)  Housing: Low Risk  (06/28/2024)  Transportation Needs: No Transportation Needs (06/28/2024)  Utilities: Not At Risk (06/28/2024)  Depression (PHQ2-9): Low Risk  (05/21/2024)  Financial Resource Strain: Low Risk  (11/18/2017)  Physical Activity: Inactive (11/18/2017)  Social Connections: Socially Isolated (06/28/2024)  Stress: No Stress Concern Present (11/18/2017)  Tobacco Use: Medium Risk (07/02/2024)    Readmission Risk Interventions     No data to display

## 2024-07-08 NOTE — Telephone Encounter (Signed)
 Patient son Amy Andrade dropped off FMLA form to be completed by PCP need a call back when its ready. Given to Chrae in [place]

## 2024-07-08 NOTE — Telephone Encounter (Signed)
 Office demographic information added to form, placed in providers review and sign folder

## 2024-07-08 NOTE — Evaluation (Addendum)
 Clinical/Bedside Swallow Evaluation Patient Details  Name: Amy Andrade MRN: [id number] Date of Birth: May 27, 1936  Today's Date: 07/08/2024 Time: SLP Start Time (ACUTE ONLY): 1133 SLP Stop Time (ACUTE ONLY): 1145 SLP Time Calculation (min) (ACUTE ONLY): 12 min  Past Medical History:  Past Medical History:  Diagnosis Date   Alzheimer disease (HCC)    Asthma    Cancer (HCC)    Hyperlipidemia    Hypertension    Pulmonary embolism (HCC) 2004   was short of breath--was on coumadin for 6 mos to a year   Past Surgical History:  Past Surgical History:  Procedure Laterality Date   ABDOMINAL HYSTERECTOMY     BUNIONECTOMY     CATARACT EXTRACTION Bilateral 06/17/2022   COLONOSCOPY Left 07/01/2024   Procedure: COLONOSCOPY;  Surgeon: Kristie Lamprey, MD;  Location: [facility];  Service: Gastroenterology;  Laterality: Left;  IDA   ESOPHAGOGASTRODUODENOSCOPY Left 07/01/2024   Procedure: EGD (ESOPHAGOGASTRODUODENOSCOPY);  Surgeon: Kristie Lamprey, MD;  Location: [facility];  Service: Gastroenterology;  Laterality: Left;  IDA   LAPAROSCOPIC RIGHT COLECTOMY N/A 07/02/2024   Procedure: COLECTOMY, RIGHT, LAPAROSCOPIC;  Surgeon: Polly Cordella LABOR, MD;  Location: [facility];  Service: General;  Laterality: N/A;   REPLACEMENT TOTAL KNEE  1980s, 2003   x2   HPI:  Amy Andrade adm to [facility] on 08/28/2024 with symptomatic anemia s/p partial colectomy and 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  She has PMH + for Alzhemier's dementia, femur fx, asthma, ETOH, and anemia.  Swallow eval ordered. CT brain negative for acute event, atrophy, CT chest/abdomen on 10/21 showed . No pleural effusion or pneumothorax.  Mild scarring in the bilateral lower lobes.    Hospitalist ordered swallow evaluation due to dementia and higher aspiration risk.  Pt denies dysphagia - but admits she has a hard time remembering things.  She has been on a full liquid diet - advanced to soft today.    Assessment / Plan /  Recommendation  Clinical Impression  Patient presents with functional oropharyngeal swallow ability based on clinical swallow evaluation.  She denies having any dysphagia nor coughing with po intake.  No focal CN deficits noted and pt able to feed herself.   SLP observed consuming graham crackers and thin water.  Laryngeal elevation observed and swallow/respiratory reciprocity was adequate.  She does report she bit her cheek on left side which causes discomfort.  No abrasion observed orally.  Advised she masticate on the right side of her mouth to allow healing.      Subtle cough noted post-swallow of pill with cranberry juice - therefore if she has any difficulty with pills, she can take them with slightly increased viscocity of liquids if helps comfort and it is not contraindicated.     She is able to self feed which maximizes her airway protection.  Recommend diet per surgical team.  Of note, she does have h/o hiatal hernia per EGD 07/01/2024, therefore esophageal precautions are advised.     No SLP follow up indicated. Thanks for this consult. SLP Visit Diagnosis: Dysphagia, unspecified (R13.10)    Aspiration Risk  Mild aspiration risk    Diet Recommendation Thin liquid (defer to MD)    Medication Administration: Other (Comment) (as tolerated) Supervision: Patient able to self feed Postural Changes: Seated upright at 90 degrees;Remain upright for at least 30 minutes after po intake    Other  Recommendations Oral Care Recommendations: Oral care BID     Assistance Recommended at Discharge  Functional Status Assessment Patient has not had a recent decline in their functional status  Frequency and Duration     N/a       Prognosis        Swallow Study   General Date of Onset: 07/08/24 HPI: Amy Andrade adm to [facility] on 08/28/2024 s/p partial colectomy and 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  She has PMH + for Alzhemier's dementia, femur fx, asthma, ETOH, and  anemia.  Swallow eval ordered. CT brain negative for acute event, atrophy, CT chest/abdomen on 10/21 showed . No pleural effusion or pneumothorax.  Mild scarring in the bilateral lower lobes.    Hospitalist ordered swallow evaluation due to dementia and higher aspiration risk.  Pt denies dysphagia - but admits she has a hard time remembering things.  She has been on a full liquid diet - advanced to soft today. Type of Study: Bedside Swallow Evaluation Diet Prior to this Study: Thin liquids (Level 0);Dysphagia 3 (mechanical soft) Temperature Spikes Noted: No Respiratory Status: Room air History of Recent Intubation: No Behavior/Cognition: Alert;Cooperative;Pleasant mood Oral Cavity Assessment: Within Functional Limits Oral Care Completed by SLP: No Oral Cavity - Dentition: Adequate natural dentition Vision: Functional for self-feeding Self-Feeding Abilities: Able to feed self Patient Positioning: Upright in bed Baseline Vocal Quality: Normal Volitional Cough: Other (Comment) (weak likely from concern for abdomen pain) Volitional Swallow: Able to elicit    Oral/Motor/Sensory Function Overall Oral Motor/Sensory Function: Within functional limits   Ice Chips Ice chips: Not tested   Thin Liquid Thin Liquid: Within functional limits Presentation: Straw    Nectar Thick Nectar Thick Liquid: Not tested   Honey Thick Honey Thick Liquid: Not tested   Puree Puree: Not tested   Solid     Solid: Within functional limits Presentation: Self Fed      Amy Andrade 07/08/2024,12:09 PM  Amy POUR, MS [facility] SLP Acute Rehab Services Office [phone]

## 2024-07-08 NOTE — Progress Notes (Addendum)
 [facility] PROGRESS NOTE    Progress Note  Amy Andrade  FMW:[id number] DOB: 1936-08-12 DOA: 06/28/2024 PCP: [name], NP     Brief Narrative:   Amy Andrade is an 88 y.o. female past medical history of Alzheimer's dementia, essential tension osteoarthritis of shoulders bilaterally, recently treated for Enterococcus faecalis UTI, chronic left shoulder dislocation and comes into the ED generalized weakness, in the ED was found to have hemoglobin of 6.9 FOBT positive.  GI was consulted underwent endoscopy and colonoscopy and was found to have a nonobstructive colonic mass.  She subsequently underwent a right colectomy 07/02/2024.  Assessment/Plan:   Symptomatic anemia/iron deficiency anemia in the setting of a colonic mass/acute blood loss anemia: On admission hemoglobin was 6.1 she status post 1 unit of packed red blood cells. She received IV iron 07/06/2024. General surgery was consulted underwent laparoscopic assisted right colectomy in 2025.  CEA was 2.9.  Pathology was positive for adenocarcinoma moderately differentiated. She is currently tolerating a soft diet. Oncology will arrange follow-up as an outpatient. PT evaluated the patient recommended skilled nursing facility  Syncope and collapse likely due to orthostatic hypotension: Antihypertensive medications were held. She was started on IV fluids. Recheck orthostatics this morning.  Essential hypertension: Medications continue IV fluids.  Hyperlipidemia  Cont meds.  Urinary urgency:  urine cultures been negative  Alzheimer disease (HCC) Noted, at high risk of aspiration and delirium.  Acute kidney injury Resuscitation lactic 3.2.  Chronic hyponatremia Has remained close to baseline.    DVT prophylaxis: lovenox  Family Communication:none Status is: Inpatient Remains inpatient appropriate because: Symptomatic anemia    Code Status:     Code Status Orders  (From admission, onward)            Start     Ordered   06/28/24 1944  Full code  Continuous       Question:  By:  Answer:  Consent: discussion documented in EHR   06/28/24 1943           Code Status History     Date Active Date Inactive Code Status Order ID Comments User Context   08/31/2020 1908 09/05/2020 2100 Full Code [id number]  [name], MD ED   05/30/2016 2319 05/31/2016 2001 Full Code [id number]  [name], MD ED      Advance Directive Documentation    Flowsheet Row Most Recent Value  Type of Advance Directive Healthcare Power of Attorney  Pre-existing out of facility DNR order (yellow form or pink MOST form) --  MOST Form in Place? --      IV Access:   Peripheral IV   Procedures and diagnostic studies:   No results found.   Medical Consultants:   None.   Subjective:    Amy Andrade no complaints.  Objective:    Vitals:   07/07/24 1101 07/07/24 1350 07/07/24 2010 07/08/24 0535  BP: (!) 131/59 (!) 127/51 (!) 139/57 (!) 132/54  Pulse: 63 75 74 67  Resp: 18 16 18 15   Temp: (!) 97.4 F (36.3 C) (!) 97.5 F (36.4 C) 98.5 F (36.9 C) 98.4 F (36.9 C)  TempSrc: Oral Axillary Oral   SpO2: 100% 94% 99% 97%  Weight:      Height:       SpO2: 97 % O2 Flow Rate (L/min): 2 L/min   Intake/Output Summary (Last 24 hours) at 07/08/2024 0710 Last data filed at 07/08/2024 9385 Gross per 24 hour  Intake 2562.47 ml  Output 750 ml  Net 1812.47 ml   Filed Weights   06/28/24 2343 07/02/24 1240 07/05/24 0542  Weight: 80 kg 80 kg 79.3 kg    Exam: General exam: In no acute distress. Respiratory system: Good air movement and clear to auscultation. Cardiovascular system: S1 & S2 heard, RRR. No JVD. Gastrointestinal system: Abdomen is nondistended, soft and nontender.  Extremities: No pedal edema. Skin: No rashes, lesions or ulcers Psychiatry: Judgement and insight appear normal. Mood & affect appropriate.    Data Reviewed:    Labs: Basic  Metabolic Panel: Recent Labs  Lab 07/03/24 1515 07/04/24 0339 07/05/24 0248 07/07/24 1048 07/08/24 0052  NA 131* 130* 133* 131* 130*  K 4.3 3.7 3.8 3.3* 4.7  CL 97* 98 101 96* 100  CO2 22 23 23 23 24   GLUCOSE 134* 83 109* 135* 137*  BUN 12 11 <5* <5* 9  CREATININE 1.07* 0.84 0.55 0.65 0.81  CALCIUM 8.9 8.4* 8.5* 8.6* 9.0  MG  --  1.8  --  1.7  --    GFR Estimated Creatinine Clearance: 53.9 mL/min (by C-G formula based on SCr of 0.81 mg/dL). Liver Function Tests: No results for input(s): AST, ALT, ALKPHOS, BILITOT, PROT, ALBUMIN in the last 168 hours. No results for input(s): LIPASE, AMYLASE in the last 168 hours. No results for input(s): AMMONIA in the last 168 hours. Coagulation profile No results for input(s): INR, PROTIME in the last 168 hours. COVID-19 Labs  No results for input(s): DDIMER, FERRITIN, LDH, CRP in the last 72 hours.  Lab Results  Component Value Date   SARSCOV2NAA POSITIVE (A) 08/31/2020    CBC: Recent Labs  Lab 07/02/24 0536 07/03/24 1515 07/04/24 1800 07/05/24 0248 07/06/24 0230 07/07/24 1048 07/08/24 0052  WBC 7.9   < > 9.0 7.6 6.8 7.4 7.8  NEUTROABS 5.7  --   --   --   --   --   --   HGB 8.7*   < > 7.9* 7.5* 8.2* 8.0* 7.2*  HCT 29.2*   < > 27.7* 25.6* 27.0* 27.8* 24.7*  MCV 74.5*   < > 74.3* 73.1* 73.4* 74.3* 74.0*  PLT 425*   < > 324 312 298 283 230   < > = values in this interval not displayed.   Cardiac Enzymes: No results for input(s): CKTOTAL, CKMB, CKMBINDEX, TROPONINI in the last 168 hours. BNP (last 3 results) No results for input(s): PROBNP in the last 8760 hours. CBG: Recent Labs  Lab 07/07/24 1014  GLUCAP 173*   D-Dimer: No results for input(s): DDIMER in the last 72 hours. Hgb A1c: No results for input(s): HGBA1C in the last 72 hours. Lipid Profile: No results for input(s): CHOL, HDL, LDLCALC, TRIG, CHOLHDL, LDLDIRECT in the last 72 hours. Thyroid  function  studies: No results for input(s): TSH, T4TOTAL, T3FREE, THYROIDAB in the last 72 hours.  Invalid input(s): FREET3 Anemia work up: No results for input(s): VITAMINB12, FOLATE, FERRITIN, TIBC, IRON, RETICCTPCT in the last 72 hours. Sepsis Labs: Recent Labs  Lab 07/05/24 0248 07/06/24 0230 07/07/24 1048 07/08/24 0052  WBC 7.6 6.8 7.4 7.8   Microbiology Recent Results (from the past 240 hours)  Urine Culture     Status: None   Collection Time: 06/28/24  5:59 PM   Specimen: Urine, Clean Catch  Result Value Ref Range Status   Specimen Description   Final    URINE, CLEAN CATCH Performed at [facility], [address], [place], [place] [address]  Special Requests   Final    NONE Performed at [facility], [address], [place], [place] [address]    Culture   Final    NO GROWTH Performed at [facility], [address], [place], [place] [address]    Report Status 06/29/2024 FINAL  Final     Medications:    sodium chloride    Intravenous Once   Chlorhexidine Gluconate Cloth  6 each Topical Daily   feeding supplement  1 Container Oral TID BM   pantoprazole  40 mg Oral BID   polyethylene glycol  17 g Oral Daily   pravastatin   40 mg Oral Daily   sodium chloride  flush  10-40 mL Intracatheter Q12H   Continuous Infusions:  lactated ringers 75 mL/hr at 07/08/24 0340      LOS: 6 days   Erle Odell Castor  [facility]  07/08/2024, 7:10 AM

## 2024-07-09 DIAGNOSIS — D649 Anemia, unspecified: Secondary | ICD-10-CM | POA: Diagnosis not present

## 2024-07-09 DIAGNOSIS — Z0279 Encounter for issue of other medical certificate: Secondary | ICD-10-CM

## 2024-07-09 LAB — CBC
HCT: 25 % — ABNORMAL LOW (ref 36.0–46.0)
Hemoglobin: 7.2 g/dL — ABNORMAL LOW (ref 12.0–15.0)
MCH: 21.5 pg — ABNORMAL LOW (ref 26.0–34.0)
MCHC: 28.8 g/dL — ABNORMAL LOW (ref 30.0–36.0)
MCV: 74.6 fL — ABNORMAL LOW (ref 80.0–100.0)
Platelets: 237 K/uL (ref 150–400)
RBC: 3.35 MIL/uL — ABNORMAL LOW (ref 3.87–5.11)
RDW: 19.1 % — ABNORMAL HIGH (ref 11.5–15.5)
WBC: 7.8 K/uL (ref 4.0–10.5)
nRBC: 0 % (ref 0.0–0.2)

## 2024-07-09 LAB — PREPARE RBC (CROSSMATCH)

## 2024-07-09 MED ORDER — SODIUM CHLORIDE 0.9% IV SOLUTION: Freq: Once | INTRAVENOUS | Status: AC

## 2024-07-09 NOTE — Plan of Care (Signed)

## 2024-07-09 NOTE — Progress Notes (Signed)
 [facility] PROGRESS NOTE    Progress Note  Amy Andrade  FMW:[id number] DOB: 1935-11-11 DOA: 06/28/2024 PCP: [name], NP     Brief Narrative:   Amy Andrade is an 88 y.o. female past medical history of Alzheimer's dementia, essential tension osteoarthritis of shoulders bilaterally, recently treated for Enterococcus faecalis UTI, chronic left shoulder dislocation and comes into the ED generalized weakness, in the ED was found to have hemoglobin of 6.9 FOBT positive.  GI was consulted underwent endoscopy and colonoscopy and was found to have a nonobstructive colonic mass.  She subsequently underwent a right colectomy 07/02/2024.  Assessment/Plan:   Symptomatic anemia/iron deficiency anemia in the setting of a colonic mass/acute blood loss anemia: On admission hemoglobin was 6.1 she status post 1 unit of packed red blood cells. She received IV iron 07/06/2024. General surgery was consulted underwent laparoscopic assisted right colectomy in 2025.   Pathology report Invasive mucinous adenocarcinoma, moderately to poorly differentiated,  CEA was 2.9.   Hemoglobin yesterday was 7.2, CBC is pending this morning. PT evaluated the patient recommended skilled nursing facility  Syncope and collapse likely due to orthostatic hypotension: Antihypertensive medications were held. KVO IV fluids Recheck orthostatics this morning.  Essential hypertension: To hold medication.   KVO IV fluids Hyperlipidemia  Cont meds.  Urinary urgency:  urine cultures been negative  Alzheimer disease (HCC) Noted, at high risk of aspiration and delirium.  Acute kidney injury Resuscitation lactic 3.2.  Chronic hyponatremia Has remained close to baseline.    DVT prophylaxis: lovenox  Family Communication:none Status is: Inpatient Remains inpatient appropriate because: Symptomatic anemia    Code Status:     Code Status Orders  (From admission, onward)           Start      Ordered   06/28/24 1944  Full code  Continuous       Question:  By:  Answer:  Consent: discussion documented in EHR   06/28/24 1943           Code Status History     Date Active Date Inactive Code Status Order ID Comments User Context   08/31/2020 1908 09/05/2020 2100 Full Code [id number]  [name], MD ED   05/30/2016 2319 05/31/2016 2001 Full Code [id number]  [name], MD ED      Advance Directive Documentation    Flowsheet Row Most Recent Value  Type of Advance Directive Healthcare Power of Attorney  Pre-existing out of facility DNR order (yellow form or pink MOST form) --  MOST Form in Place? --      IV Access:   Peripheral IV   Procedures and diagnostic studies:   No results found.   Medical Consultants:   None.   Subjective:    Amy Andrade no complaints  Objective:    Vitals:   07/08/24 0906 07/08/24 1401 07/08/24 2124 07/09/24 0628  BP: (!) 123/56 (!) 161/60 (!) 161/56 (!) 166/62  Pulse: 80 80 75 65  Resp: 18 16 16 17   Temp: 98.4 F (36.9 C) 97.8 F (36.6 C) 98.4 F (36.9 C) 98 F (36.7 C)  TempSrc: Oral Oral Oral Oral  SpO2: 98% 99% 100% 98%  Weight:      Height:       SpO2: 98 % O2 Flow Rate (L/min): 2 L/min   Intake/Output Summary (Last 24 hours) at 07/09/2024 0844 Last data filed at 07/09/2024 0600 Gross per 24 hour  Intake 2868.27 ml  Output 1300  ml  Net 1568.27 ml   Filed Weights   06/28/24 2343 07/02/24 1240 07/05/24 0542  Weight: 80 kg 80 kg 79.3 kg    Exam: General exam: In no acute distress. Respiratory system: Good air movement and clear to auscultation. Cardiovascular system: S1 & S2 heard, RRR. No JVD. Gastrointestinal system: Abdomen is nondistended, soft and nontender.  Extremities: No pedal edema. Skin: No rashes, lesions or ulcers Psychiatry: Judgement and insight appear normal. Mood & affect appropriate.  Data Reviewed:    Labs: Basic Metabolic Panel: Recent Labs  Lab  07/03/24 1515 07/04/24 0339 07/05/24 0248 07/07/24 1048 07/08/24 0052  NA 131* 130* 133* 131* 130*  K 4.3 3.7 3.8 3.3* 4.7  CL 97* 98 101 96* 100  CO2 22 23 23 23 24   GLUCOSE 134* 83 109* 135* 137*  BUN 12 11 <5* <5* 9  CREATININE 1.07* 0.84 0.55 0.65 0.81  CALCIUM 8.9 8.4* 8.5* 8.6* 9.0  MG  --  1.8  --  1.7  --    GFR Estimated Creatinine Clearance: 53.9 mL/min (by C-G formula based on SCr of 0.81 mg/dL). Liver Function Tests: No results for input(s): AST, ALT, ALKPHOS, BILITOT, PROT, ALBUMIN in the last 168 hours. No results for input(s): LIPASE, AMYLASE in the last 168 hours. No results for input(s): AMMONIA in the last 168 hours. Coagulation profile No results for input(s): INR, PROTIME in the last 168 hours. COVID-19 Labs  No results for input(s): DDIMER, FERRITIN, LDH, CRP in the last 72 hours.  Lab Results  Component Value Date   SARSCOV2NAA POSITIVE (A) 08/31/2020    CBC: Recent Labs  Lab 07/04/24 1800 07/05/24 0248 07/06/24 0230 07/07/24 1048 07/08/24 0052  WBC 9.0 7.6 6.8 7.4 7.8  HGB 7.9* 7.5* 8.2* 8.0* 7.2*  HCT 27.7* 25.6* 27.0* 27.8* 24.7*  MCV 74.3* 73.1* 73.4* 74.3* 74.0*  PLT 324 312 298 283 230   Cardiac Enzymes: No results for input(s): CKTOTAL, CKMB, CKMBINDEX, TROPONINI in the last 168 hours. BNP (last 3 results) No results for input(s): PROBNP in the last 8760 hours. CBG: Recent Labs  Lab 07/07/24 1014  GLUCAP 173*   D-Dimer: No results for input(s): DDIMER in the last 72 hours. Hgb A1c: No results for input(s): HGBA1C in the last 72 hours. Lipid Profile: No results for input(s): CHOL, HDL, LDLCALC, TRIG, CHOLHDL, LDLDIRECT in the last 72 hours. Thyroid  function studies: No results for input(s): TSH, T4TOTAL, T3FREE, THYROIDAB in the last 72 hours.  Invalid input(s): FREET3 Anemia work up: No results for input(s): VITAMINB12, FOLATE, FERRITIN, TIBC,  IRON, RETICCTPCT in the last 72 hours. Sepsis Labs: Recent Labs  Lab 07/05/24 0248 07/06/24 0230 07/07/24 1048 07/08/24 0052  WBC 7.6 6.8 7.4 7.8   Microbiology No results found for this or any previous visit (from the past 240 hours).    Medications:    sodium chloride    Intravenous Once   Chlorhexidine Gluconate Cloth  6 each Topical Daily   feeding supplement  1 Container Oral TID BM   pantoprazole  40 mg Oral BID   polyethylene glycol  17 g Oral Daily   pravastatin   40 mg Oral Daily   sodium chloride  flush  10-40 mL Intracatheter Q12H   Continuous Infusions:  sodium chloride  75 mL/hr at 07/09/24 0030      LOS: 7 days   Amy Andrade  [facility]  07/09/2024, 8:44 AM

## 2024-07-09 NOTE — Progress Notes (Signed)
 Occupational Therapy Treatment Patient Details Name: Amy Andrade MRN: [id number] DOB: [birth year] Today's Date: 07/09/2024   History of present illness 88 yo females brought to ED 06/28/24 with weakness, anemia, FOBT, severe, B12 deficiency PMH includes Alzheimer dementia, asthma, chronic pain, HTN.chronic pain due to OA of both shoulders and chronic left shoulder dislocation.  1 unit PRBC on 10/12, EGD r cm hiatal hernia and lesions and colonoscopy 10/15 notable for non-obstructing colon mass, pt underwent laparoscopic assisted right colectomy on 07/02/2024   OT comments  Pt making slow progress with functional goals. Family now planning for SNF for short term rehab as pt not progressing well enough for family to manage at home. Pt continues to require extensive assist with LB ADLs, toileting and max A +7/fzryjwprjo lift for standing. OT will continue to follow acutely to maximize level of function and safety      If plan is discharge home, recommend the following:  A lot of help with bathing/dressing/bathroom;A lot of help with walking and/or transfers;Assist for transportation;Help with stairs or ramp for entrance   Equipment Recommendations  Tub/shower seat    Recommendations for Other Services      Precautions / Restrictions Precautions Precautions: Fall Recall of Precautions/Restrictions: Impaired Precaution/Restrictions Comments: severe  bilateral shoulder AROM impaired, 0-60 degrees FF; significant crepitus noted with R shoulder movement Restrictions Weight Bearing Restrictions Per Provider Order: No       Mobility Bed Mobility Overal bed mobility: Needs Assistance Bed Mobility: Rolling, Sidelying to Sit, Sit to Sidelying Rolling: Mod assist Sidelying to sit: Mod assist, HOB elevated     Sit to sidelying: Max assist General bed mobility comments: assist with trunk and LE mgt    Transfers Overall transfer level: Needs assistance Equipment used: Rolling walker (2  wheels) Transfers: Sit to/from Stand Sit to Stand: Total assist, Max assist           General transfer comment: attempted sit-stand x 2 trials maxA and pt unable to barely clear bottom from bed, pt reports pain in ABD and shoulders as limitations. PT previously used [place], will continue with [place] or +2 person next session     Balance Overall balance assessment: Needs assistance Sitting-balance support: Bilateral upper extremity supported, Feet supported Sitting balance-Leahy Scale: Fair Sitting balance - Comments: posterior lean during UB dynamic tasks Postural control: Posterior lean                                 ADL either performed or assessed with clinical judgement   ADL Overall ADL's : Needs assistance/impaired     Grooming: Wash/dry hands;Wash/dry face;Contact guard assist;Sitting   Upper Body Bathing: Minimal assistance;Sitting   Lower Body Bathing: Maximal assistance   Upper Body Dressing : Minimal assistance;Sitting         Toilet Transfer Details (indicate cue type and reason): unable to barely clear buttoscks from bed during 2 trials sit- stand Toileting- Clothing Manipulation and Hygiene: Total assistance;Bed level         General ADL Comments: pt stating multiple times that she had to urinate and unable to recall that [place] was in place, despite reminders    Extremity/Trunk Assessment Upper Extremity Assessment Upper Extremity Assessment: Generalized weakness RUE Deficits / Details: limited  shoulder AROM impaired, 0-60 degrees FF; significant crepitus noted with R shoulder movement LUE Deficits / Details: limited shoulder flex   Lower Extremity Assessment Lower Extremity Assessment: Defer to PT  evaluation        Vision Baseline Vision/History: 1 Wears glasses Ability to See in Adequate Light: 0 Adequate Patient Visual Report: No change from baseline     Perception     Praxis     Communication  Communication Communication: No apparent difficulties   Cognition Arousal: Alert Behavior During Therapy: WFL for tasks assessed/performed, Anxious Cognition: History of cognitive impairments   Orientation impairments: Place, Situation   Memory impairment (select all impairments): Short-term memory     OT - Cognition Comments: pt not recalling/retaining information. Pt asked OT multiple times where she was, why and why is her abdomen sore. Pt stating multiple times that she had to urinate and unable to recall that [place] was in place, despite reminders                 Following commands: Intact Following commands impaired: Follows one step commands with increased time      Cueing   Cueing Techniques: Gestural cues, Tactile cues, Verbal cues  Exercises      Shoulder Instructions       General Comments      Pertinent Vitals/ Pain       Pain Assessment Pain Assessment: Faces Faces Pain Scale: Hurts whole lot Pain Location: ABD, back and B shoulders with movement Pain Descriptors / Indicators: Grimacing, Guarding, Operative site guarding, Moaning Pain Intervention(s): Limited activity within patient's tolerance, Monitored during session, Repositioned  Home Living                                          Prior Functioning/Environment              Frequency  Min 1X/week        Progress Toward Goals  OT Goals(current goals can now be found in the care plan section)  Progress towards OT goals: OT to reassess next treatment     Plan      Co-evaluation                 AM-PAC OT 6 Clicks Daily Activity     Outcome Measure   Help from another person eating meals?: None Help from another person taking care of personal grooming?: A Little Help from another person toileting, which includes using toliet, bedpan, or urinal?: Total Help from another person bathing (including washing, rinsing, drying)?: A Lot Help from another  person to put on and taking off regular upper body clothing?: A Little Help from another person to put on and taking off regular lower body clothing?: Total 6 Click Score: 14    End of Session Equipment Utilized During Treatment: Gait belt;Rolling walker (2 wheels)  OT Visit Diagnosis: Unsteadiness on feet (R26.81);Other abnormalities of gait and mobility (R26.89);Muscle weakness (generalized) (M62.81)   Activity Tolerance Patient limited by fatigue;Patient limited by pain   Patient Left with call bell/phone within reach;in bed;with bed alarm set   Nurse Communication Mobility status        Time: 1411-1433 OT Time Calculation (min): 22 min  Charges: OT General Charges $OT Visit: 1 Visit OT Treatments $Therapeutic Activity: 8-22 mins    Jacques Karna Loose 07/09/2024, 3:10 PM

## 2024-07-09 NOTE — TOC Transition Note (Signed)
 Transition of Care [facility]) - Discharge Note   Patient Details  Name: Amy Andrade MRN: [id number] Date of Birth: 11/08/1935  Transition of Care [facility]) CM/SW Contact:  Alfonse JONELLE Rex, RN Phone Number: 07/09/2024, 12:50 PM   Clinical Narrative:    Per [facility] SNF auth approved; Plan Auth ID [id number], days approved 10/22 to 07/10/24, team notified.       Final next level of care: Home w Home Health Services Barriers to Discharge: Continued Medical Work up   Patient Goals and CMS Choice Patient states their goals for this hospitalization and ongoing recovery are:: Home CMS Medicare.gov Compare Post Acute Care list provided to:: Patient Represenative (must comment) ([name](Son)) Choice offered to / list presented to : Adult Children      Discharge Placement                       Discharge Plan and Services Additional resources added to the After Visit Summary for     Discharge Planning Services: CM Consult Post Acute Care Choice: Home Health, Durable Medical Equipment          DME Arranged: Hospital bed DME Agency: [employer] Date DME Agency Contacted: 07/01/24     HH Arranged: PT, OT          Social Drivers of Health (SDOH) Interventions SDOH Screenings   Food Insecurity: No Food Insecurity (06/28/2024)  Housing: Low Risk  (06/28/2024)  Transportation Needs: No Transportation Needs (06/28/2024)  Utilities: Not At Risk (06/28/2024)  Depression (PHQ2-9): Low Risk  (05/21/2024)  Financial Resource Strain: Low Risk  (11/18/2017)  Physical Activity: Inactive (11/18/2017)  Social Connections: Socially Isolated (06/28/2024)  Stress: No Stress Concern Present (11/18/2017)  Tobacco Use: Medium Risk (07/02/2024)     Readmission Risk Interventions     No data to display

## 2024-07-09 NOTE — Progress Notes (Signed)
 Progress Note  7 Days Post-Op  Subjective: Son at bedside and helps provide history.  Patient reports no abdominal pain. Tolerating soft diet without nausea and vomiting. Had bowel movement today. Unsure if passing flatulence.   ROS  All negative with the exception of above.  Objective: Vital signs in last 24 hours: Temp:  [97.8 F (36.6 C)-98.4 F (36.9 C)] 98 F (36.7 C) (10/23 0628) Pulse Rate:  [65-80] 65 (10/23 0628) Resp:  [16-17] 17 (10/23 0628) BP: (161-166)/(56-62) 166/62 (10/23 0628) SpO2:  [98 %-100 %] 98 % (10/23 0628) Last BM Date : 07/08/24  Intake/Output from previous day: 10/22 0701 - 10/23 0700 In: 2868.3 [P.O.:1050; I.V.:1818.3] Out: 1300 [Urine:1300] Intake/Output this shift: No intake/output data recorded.  PE: General: Pleasant female who is laying in bed in NAD. HEENT: Head is normocephalic, atraumatic. Heart: HR normal. Lungs: Respiratory effort nonlabored. Abd: Soft, NT, ND. Midline incision with dermabond. Incision is C/D/I.Laparoscopic incisions with clean dry dressing in place. No rebound tenderness or guarding.  Skin: Warm and dry.   Lab Results:  Recent Labs    07/08/24 0052 07/09/24 0832  WBC 7.8 7.8  HGB 7.2* 7.2*  HCT 24.7* 25.0*  PLT 230 237   BMET Recent Labs    07/07/24 1048 07/08/24 0052  NA 131* 130*  K 3.3* 4.7  CL 96* 100  CO2 23 24  GLUCOSE 135* 137*  BUN <5* 9  CREATININE 0.65 0.81  CALCIUM 8.6* 9.0   PT/INR No results for input(s): LABPROT, INR in the last 72 hours. CMP     Component Value Date/Time   NA 130 (L) 07/08/2024 0052   NA 136 (A) 10/20/2020 0000   K 4.7 07/08/2024 0052   CL 100 07/08/2024 0052   CO2 24 07/08/2024 0052   GLUCOSE 137 (H) 07/08/2024 0052   BUN 9 07/08/2024 0052   BUN 16 10/20/2020 0000   CREATININE 0.81 07/08/2024 0052   CREATININE 0.71 02/25/2024 0809   CALCIUM 9.0 07/08/2024 0052   PROT 6.7 06/28/2024 1352   ALBUMIN 4.1 06/28/2024 1352   AST 18 06/28/2024 1352    ALT 10 06/28/2024 1352   ALKPHOS 68 06/28/2024 1352   BILITOT 0.6 06/28/2024 1352   GFRNONAA >60 07/08/2024 0052   GFRNONAA 74 03/08/2021 0910   GFRAA 85 03/08/2021 0910   Lipase  No results found for: LIPASE     Studies/Results: No results found.  Anti-infectives: Anti-infectives (From admission, onward)    Start     Dose/Rate Route Frequency Ordered Stop   07/02/24 1130  cefoTEtan (CEFOTAN) 2 g in sodium chloride  0.9 % 100 mL IVPB        2 g 200 mL/hr over 30 Minutes Intravenous On call to O.R. 07/02/24 1033 07/02/24 1425        Assessment/Plan POD7 Laparoscopic assisted right colectomy by Dr. Polly on 10/16 for Ascending colon mass  - Afebrile.  - Hgb 7.2. Was also 7.2 on 10/22. HDS. No bloody BM's reported. Continue to monitor. - Tolerating soft diet. Having BM. No n/v. No pain. Can continue soft diet.  - Pre-op CT CAP with no findings suspicious for metastatic disease.  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 Margins negative for carcinoma and dysplasia. Twelve lymph nodes, all negative for carcinoma (0/12).  - Oncology has consulted and will be providing further evaluation and treatment recommendations.  Surveillance planned in clinic as of now. - Mobilize, pulm toilet as much as possible - When hgb stable, okay for discharge to SNF from our standpoint.   FEN: Soft diet, IVF per [facility] VTE: scds, Hgb stable. okay for chem ppx from a general surgery standpoint ID: Cefotetan peri-op, none currently.    LOS: 7 days   I reviewed nursing notes, specialist notes, hospitalist notes, last 24 h vitals and pain scores, last 48 h intake and output, last 24 h labs and trends, and last 24 h imaging results.   [name], [facility] 07/09/2024, 10:32 AM Please see  Amion for pager number during day hours 7:00am-4:30pm

## 2024-07-09 NOTE — Telephone Encounter (Signed)
 FMLA paperwork completed.Please call POA to pickup original copy.

## 2024-07-09 NOTE — Telephone Encounter (Signed)
 Amy Andrade is aware and plans to pick up tomorrow. Copy sent for scanning

## 2024-07-10 ENCOUNTER — Telehealth (HOSPITAL_COMMUNITY): Payer: Self-pay

## 2024-07-10 ENCOUNTER — Telehealth (HOSPITAL_COMMUNITY): Payer: Self-pay | Admitting: Pharmacy Technician

## 2024-07-10 DIAGNOSIS — R269 Unspecified abnormalities of gait and mobility: Secondary | ICD-10-CM | POA: Insufficient documentation

## 2024-07-10 DIAGNOSIS — C189 Malignant neoplasm of colon, unspecified: Secondary | ICD-10-CM | POA: Insufficient documentation

## 2024-07-10 DIAGNOSIS — J45909 Unspecified asthma, uncomplicated: Secondary | ICD-10-CM | POA: Insufficient documentation

## 2024-07-10 DIAGNOSIS — D630 Anemia in neoplastic disease: Secondary | ICD-10-CM | POA: Insufficient documentation

## 2024-07-10 DIAGNOSIS — D649 Anemia, unspecified: Secondary | ICD-10-CM | POA: Diagnosis not present

## 2024-07-10 DIAGNOSIS — K922 Gastrointestinal hemorrhage, unspecified: Secondary | ICD-10-CM | POA: Diagnosis not present

## 2024-07-10 DIAGNOSIS — M6281 Muscle weakness (generalized): Secondary | ICD-10-CM | POA: Insufficient documentation

## 2024-07-10 DIAGNOSIS — D509 Iron deficiency anemia, unspecified: Secondary | ICD-10-CM | POA: Insufficient documentation

## 2024-07-10 DIAGNOSIS — Z9849 Cataract extraction status, unspecified eye: Secondary | ICD-10-CM | POA: Insufficient documentation

## 2024-07-10 DIAGNOSIS — F0280 Dementia in other diseases classified elsewhere without behavioral disturbance: Secondary | ICD-10-CM | POA: Insufficient documentation

## 2024-07-10 DIAGNOSIS — I2699 Other pulmonary embolism without acute cor pulmonale: Secondary | ICD-10-CM | POA: Insufficient documentation

## 2024-07-10 DIAGNOSIS — E871 Hypo-osmolality and hyponatremia: Secondary | ICD-10-CM | POA: Insufficient documentation

## 2024-07-10 LAB — CBC
HCT: 29.3 % — ABNORMAL LOW (ref 36.0–46.0)
Hemoglobin: 8.9 g/dL — ABNORMAL LOW (ref 12.0–15.0)
MCH: 23.1 pg — ABNORMAL LOW (ref 26.0–34.0)
MCHC: 30.4 g/dL (ref 30.0–36.0)
MCV: 75.9 fL — ABNORMAL LOW (ref 80.0–100.0)
Platelets: 250 K/uL (ref 150–400)
RBC: 3.86 MIL/uL — ABNORMAL LOW (ref 3.87–5.11)
RDW: 20.1 % — ABNORMAL HIGH (ref 11.5–15.5)
WBC: 8.3 K/uL (ref 4.0–10.5)
nRBC: 0 % (ref 0.0–0.2)

## 2024-07-10 LAB — TYPE AND SCREEN
ABO/RH(D): AB POS
Antibody Screen: NEGATIVE
Unit division: 0

## 2024-07-10 LAB — BPAM RBC
Blood Product Expiration Date: 202511232359
ISSUE DATE / TIME: 202510231950
Unit Type and Rh: 5100

## 2024-07-10 NOTE — Care Management Important Message (Signed)
 Important Message  Patient Details  Name: Amy Andrade MRN: [id number] Date of Birth: 14-Sep-1936   Important Message Given:        Glade Cuff 07/10/2024, 11:32 AM

## 2024-07-10 NOTE — Progress Notes (Signed)
 Progress Note  8 Days Post-Op  Subjective: Nursing staff helps provide history. Patient reports no pain. Having bowel movements. Unsure if she has had flatulence. Tolerated soft diet. No n/v  ROS  All negative with the exception of above.  Objective: Vital signs in last 24 hours: Temp:  [97.8 F (36.6 C)-98.4 F (36.9 C)] 98.2 F (36.8 C) (10/24 0508) Pulse Rate:  [68-92] 68 (10/24 0508) Resp:  [15-18] 15 (10/24 0508) BP: (153-187)/(59-67) 166/67 (10/24 0634) SpO2:  [97 %-100 %] 99 % (10/24 0508) Last BM Date : 07/09/24  Intake/Output from previous day: 10/23 0701 - 10/24 0700 In: 571.8 [P.O.:280; I.V.:4.8; Blood:287] Out: 4600 [Urine:4600] Intake/Output this shift: No intake/output data recorded.  PE: General: Pleasant female who is laying in bed in NAD. HEENT: Head is normocephalic, atraumatic. Heart: HR normal. Lungs: Respiratory effort nonlabored. Abd: Soft, NT, ND. Midline incision with dermabond. Incision is C/D/I.Laparoscopic incisions with clean dry dressing in place. No rebound tenderness or guarding.  Skin: Warm and dry.   Lab Results:  Recent Labs    07/09/24 0832 07/10/24 0138  WBC 7.8 8.3  HGB 7.2* 8.9*  HCT 25.0* 29.3*  PLT 237 250   BMET Recent Labs    07/07/24 1048 07/08/24 0052  NA 131* 130*  K 3.3* 4.7  CL 96* 100  CO2 23 24  GLUCOSE 135* 137*  BUN <5* 9  CREATININE 0.65 0.81  CALCIUM 8.6* 9.0   PT/INR No results for input(s): LABPROT, INR in the last 72 hours. CMP     Component Value Date/Time   NA 130 (L) 07/08/2024 0052   NA 136 (A) 10/20/2020 0000   K 4.7 07/08/2024 0052   CL 100 07/08/2024 0052   CO2 24 07/08/2024 0052   GLUCOSE 137 (H) 07/08/2024 0052   BUN 9 07/08/2024 0052   BUN 16 10/20/2020 0000   CREATININE 0.81 07/08/2024 0052   CREATININE 0.71 02/25/2024 0809   CALCIUM 9.0 07/08/2024 0052   PROT 6.7 06/28/2024 1352   ALBUMIN 4.1 06/28/2024 1352   AST 18 06/28/2024 1352   ALT 10 06/28/2024 1352    ALKPHOS 68 06/28/2024 1352   BILITOT 0.6 06/28/2024 1352   GFRNONAA >60 07/08/2024 0052   GFRNONAA 74 03/08/2021 0910   GFRAA 85 03/08/2021 0910   Lipase  No results found for: LIPASE     Studies/Results: No results found.  Anti-infectives: Anti-infectives (From admission, onward)    Start     Dose/Rate Route Frequency Ordered Stop   07/02/24 1130  cefoTEtan (CEFOTAN) 2 g in sodium chloride  0.9 % 100 mL IVPB        2 g 200 mL/hr over 30 Minutes Intravenous On call to O.R. 07/02/24 1033 07/02/24 1425        Assessment/Plan POD8 Laparoscopic assisted right colectomy by Dr. Polly on 10/16 for Ascending colon mass  - Afebrile.  - Hgb 8.9. HDS. Continue to monitor. - Tolerating soft diet. Having BM. No n/v. No pain. Can continue soft diet.  - Pre-op CT CAP with no findings suspicious for metastatic disease.  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 Margins negative for carcinoma and dysplasia. Twelve lymph nodes, all negative for carcinoma (0/12).  - Oncology has consulted and will be providing further evaluation and treatment recommendations. Surveillance planned in clinic as of now. - Mobilize, pulm toilet as much as  possible - Okay for discharge to SNF from our standpoint.   FEN: Soft diet, IVF per [facility] VTE: scds, Hgb stable. okay for chem ppx from a general surgery standpoint ID: Cefotetan peri-op, none currently.     LOS: 8 days   I reviewed specialist notes, hospitalist notes, nursing notes, last 24 h vitals and pain scores, last 48 h intake and output, last 24 h labs and trends, and last 24 h imaging results.   [name], [facility] 07/10/2024, 8:45 AM Please see Amion for pager number during day hours 7:00am-4:30pm

## 2024-07-10 NOTE — Telephone Encounter (Signed)
 Patient referred to infusion pharmacy team for ambulatory infusion of IV iron.  Insurance - [employer] Medicare Site of care - Site of care: [facility] Dx code - D50.9/D63 IV Iron Therapy - Feraheme 510mg  x2 Infusion appointments - Scheduling team will schedule patient as soon as possible.

## 2024-07-10 NOTE — Plan of Care (Signed)
  Problem: Clinical Measurements: Goal: Diagnostic test results will improve Outcome: Progressing   Problem: Nutrition: Goal: Adequate nutrition will be maintained Outcome: Progressing   Problem: Pain Managment: Goal: General experience of comfort will improve and/or be controlled Outcome: Progressing   Problem: Safety: Goal: Ability to remain free from injury will improve Outcome: Progressing

## 2024-07-10 NOTE — Progress Notes (Signed)
 R DL IJ removed per protocol per MD order. Manual pressure applied for 5 mins. Vaseline gauze, gauze, and Tegaderm applied over insertion site. No bleeding or swelling noted. Instructed patient to remain in bed for thirty mins. Educated patient about S/S of infection and when to call MD; no heavy lifting or pressure on right side for 24 hours; keep dressing dry and intact for 24 hours. Pt verbalized comprehension, but stated she would not remember.

## 2024-07-10 NOTE — Discharge Summary (Signed)
 Physician Discharge Summary  Amy Andrade FMW:[id number] DOB: Dec 06, 1935 DOA: 06/28/2024  PCP: Amy Roxan BROCKS, NP  Admit date: 06/28/2024 Discharge date: 07/10/2024  Admitted From: Home Disposition:  SNF  Recommendations for Outpatient Follow-up:  Follow up with PCP in 1-2 weeks Please obtain BMP/CBC in one week   Home Health:No Equipment/Devices:None  Discharge Condition:Stable CODE STATUS:Full Diet recommendation: Heart Healthy   Brief/Interim Summary:  88 y.o. female past medical history of Alzheimer's dementia, essential tension osteoarthritis of shoulders bilaterally, recently treated for Enterococcus faecalis UTI, chronic left shoulder dislocation and comes into the ED generalized weakness, in the ED was found to have hemoglobin of 6.9 FOBT positive.  GI was consulted underwent endoscopy and colonoscopy and was found to have a nonobstructive colonic mass.  She subsequently underwent a right colectomy 07/02/2024.   Discharge Diagnoses:  Principal Problem:   Symptomatic anemia Active Problems:   Alzheimer disease (HCC)   Hyperlipidemia   Hypertension   Chronic hyponatremia   Adenocarcinoma of colon (HCC)  Symptomatic iron deficiency anemia in the setting of adenocarcinoma/acute blood loss anemia: On admission hemoglobin was 6.1 she is status post 2 units packed red blood cells she did receive IV iron. General surgery was consulted and she is underwent laparoscopic assisted colectomy on October 2025. Pathology report showed invasive mucinous adenocarcinoma moderately differentiated. CEA was 2.9. She will follow-up with oncology as an outpatient. PT evaluated the patient will need skilled nursing facility.  Syncope and collapse: Likely due to orthostatic hypotension antihypertensive medications were held she was fluid resuscitated this resolved.  Central hypertension: Resume antihypertensive medication as an outpatient as blood pressure tolerates  it.  Lipidemia: Continue statins.  Urinary urgency: Urine culture negative.  Alzheimer's dementia: Noted.  Acute kidney injury: Resolved with IV fluid resuscitation.    Discharge Instructions  Discharge Instructions     Amb Referral to Intravenous Iron Therapy   Complete by: As directed    You have been referred to [facility] Infusion team for IV Iron Infusions. The infusion pharmacy team will reach out to you with appointment information.    Primary Diagnosis Code for IV Iron: D50.9 - Iron deficiency Anemia   Secondary diagnosis code for IV iron: Other   Comment: malignancy   Diet - low sodium heart healthy   Complete by: As directed    Increase activity slowly   Complete by: As directed    No dressing needed   Complete by: As directed       Allergies as of 07/10/2024   No Known Allergies      Medication List     TAKE these medications    amLODipine  10 MG tablet Commonly known as: NORVASC  Take 1 tablet (10 mg total) by mouth daily. What changed: when to take this   aspirin  81 MG tablet Take 81 mg by mouth in the morning.   CALCIUM-VITAMIN D PO Take 1 tablet by mouth 2 (two) times daily.   HYDROcodone -acetaminophen  5-325 MG tablet Commonly known as: NORCO/VICODIN Take 1 tablet by mouth every 12 (twelve) hours as needed for severe pain (pain score 7-10). What changed: reasons to take this   irbesartan  300 MG tablet Commonly known as: AVAPRO  Take 1 tablet (300 mg total) by mouth daily. What changed: when to take this   methocarbamol  500 MG tablet Commonly known as: ROBAXIN  Take 1 tablet by mouth once daily What changed: when to take this   nitroGLYCERIN  0.4 MG SL tablet Commonly known as: NITROSTAT  Place 1 tablet (  0.4 mg total) under the tongue every 5 (five) minutes x 3 doses as needed for chest pain.   nystatin  cream Commonly known as: MYCOSTATIN  Apply 1 application  topically 2 (two) times daily. What changed:  when to take  this reasons to take this   pravastatin  40 MG tablet Commonly known as: PRAVACHOL  Take 1 tablet (40 mg total) by mouth daily. What changed: when to take this   triamcinolone  cream 0.1 % Commonly known as: KENALOG  Apply 1 Application topically 2 (two) times daily. Affected area on right foot What changed:  when to take this reasons to take this               Durable Medical Equipment  (From admission, onward)           Start     Ordered   07/06/24 1139  For home use only DME Hospital bed  Once       Question Answer Comment  Length of Need 6 Months   The above medical condition requires: Patient requires the ability to reposition frequently   Bed type Semi-electric      07/06/24 1138              Discharge Care Instructions  (From admission, onward)           Start     Ordered   07/10/24 0000  No dressing needed        07/10/24 9076            Contact information for follow-up providers     [facility] Follow up.   Why: HHRN/PT/OT Contact information: [address] [place] [address] [phone]         [facility] Follow up.   Why: hospital bed Contact information: [address] [place] [facility] [phone]        Polly Cordella LABOR, MD Follow up on 07/30/2024.   Specialty: General Surgery Why: 10am. Please bring a copy of your photo ID, insurance card and arrive 30 minutes prior to your appointment for paperwork. Contact information: [address] [place] [address] [phone]              Contact information for after-discharge care     Destination     [facility], [place] .   Service: Skilled Nursing Contact information: [address] [place]  [phone]             Durable Medical Equipment     [facility] (DME) .   Service: Durable Medical Equipment Contact information: [address] [place]  [address] [phone]                    No Known Allergies  Consultations: General Surgery   Procedures/Studies: CT CHEST ABDOMEN PELVIS W CONTRAST Result Date: 07/02/2024 EXAM: CT CHEST, ABDOMEN AND PELVIS WITH CONTRAST 07/02/2024 11:22:16 AM TECHNIQUE: CT of the chest, abdomen and pelvis was performed with the administration of 100 mL (iohexol (OMNIPAQUE) 300 MG/ML solution 100 mL IOHEXOL 300 MG/ML SOLN) intravenous contrast. Multiplanar reformatted images are provided for review. Automated exposure control, iterative reconstruction, and/or weight based adjustment of the mA/kV was utilized to reduce the radiation dose to as low as reasonably achievable. COMPARISON: 06/28/2024 CLINICAL HISTORY: Metastatic disease evaluation. Per Dr.Pahwani notes:; Brief Narrative: ; ANYI FELS is a 88 y.o. female with medical history significant for  Alzheimer's dementia, HTN, HLD, chronic pain due to OA of both shoulders and chronic left shoulder dislocation who presented to the ED for evaluation of generalized weakness and ; no other complaint. She notes frequent urinary urgency. She was recently treated for UTI initially with ciprofloxacin . Urine culture grew Enterococcus faecalis and antibiotics were switched to nitrofurantoin  which she has since completed FINDINGS: CHEST: MEDIASTINUM AND LYMPH NODES: Heart and pericardium are unremarkable. The central airways are clear. No mediastinal, hilar or axillary lymphadenopathy. Mild thoracic aortic atherosclerosis. Mild coronary atherosclerosis of the LAD and right coronary artery. LUNGS AND PLEURA: No focal consolidation or pulmonary edema. No pleural effusion or pneumothorax. Mild scarring in the bilateral lower lobes. ABDOMEN AND PELVIS: LIVER: The liver is unremarkable. GALLBLADDER AND BILE DUCTS: Gallbladder is unremarkable. No biliary ductal dilatation. SPLEEN: No acute abnormality. PANCREAS: No acute abnormality.  ADRENAL GLANDS: No acute abnormality. KIDNEYS, URETERS AND BLADDER: No stones in the kidneys or ureters. No hydronephrosis. No perinephric or periureteral stranding. Urinary bladder is unremarkable. GI AND BOWEL: Stomach demonstrates no acute abnormality. There is no bowel obstruction. REPRODUCTIVE ORGANS: Status post hysterectomy with new fluid within the vaginal apex (image 101). PERITONEUM AND RETROPERITONEUM: No ascites. No free air. VASCULATURE: Aorta is normal in caliber. Atherosclerotic calcifications of the abdominal aorta and branch vessels, although patent. ABDOMINAL AND PELVIS LYMPH NODES: No lymphadenopathy. BONES AND SOFT TISSUES: Severe degenerative changes of the right shoulder. Mild degenerative changes of the left shoulder with anterior subluxation. Degenerative changes of the visualized thoracolumbar spine. Status post L4-L5 PLIF. S-shaped thoracolumbar scoliosis. No acute osseous abnormality. No focal soft tissue abnormality. IMPRESSION: 1. Fluid in the vaginal apex, new from recent prior. Consider GYN evaluation. 2. Otherwise unchanged from recent CT. Chronic ancillary findings as above. 3. No findings suspicious for primary malignancy or metastatic disease. Electronically signed by: Pinkie Pebbles MD 07/02/2024 08:23 PM EDT RP Workstation: [id number]   CT CHEST ABDOMEN PELVIS WO CONTRAST Result Date: 06/28/2024 CLINICAL DATA:  Polytrauma, blunt. Recently treated UTI. Low back pain. History of dementia. EXAM: CT CHEST, ABDOMEN AND PELVIS WITHOUT CONTRAST TECHNIQUE: Multidetector CT imaging of the chest, abdomen and pelvis was performed following the standard protocol without IV contrast. RADIATION DOSE REDUCTION: This exam was performed according to the departmental dose-optimization program which includes automated exposure control, adjustment of the mA and/or kV according to patient size and/or use of iterative reconstruction technique. COMPARISON:  None Available. FINDINGS: CT CHEST  FINDINGS Cardiovascular: The heart is mildly enlarged and there is a trace pericardial effusion. Scattered coronary artery calcifications are noted. There is atherosclerotic calcification of the aorta without evidence of aneurysm. The pulmonary trunk is normal in caliber. Mediastinum/Nodes: No mediastinal or axillary lymphadenopathy. Evaluation of the hila is limited due to lack of IV contrast. The trachea and esophagus are within normal limits. There is a small hiatal hernia. Lungs/Pleura: Atelectasis/scarring is present bilaterally. No effusion or pneumothorax is seen. Musculoskeletal: A fat containing Bochdalek hernia is noted on the left. Degenerative changes are present in the thoracic spine. Destructive and degenerative changes are present at the glenohumeral joints bilaterally with a chronic subluxation. A joint effusion is present on the left. There is kyphosis of the thoracic spine. No acute fracture is seen. CT ABDOMEN PELVIS FINDINGS Hepatobiliary: No focal liver abnormality is seen. No gallstones, gallbladder wall thickening, or biliary dilatation. Pancreas: Unremarkable. No pancreatic ductal dilatation or surrounding inflammatory changes. Spleen: Normal in size without focal abnormality. Adrenals/Urinary Tract: The adrenal glands are within normal limits. No  renal calculus or hydronephrosis bilaterally. The bladder is unremarkable. Stomach/Bowel: There is a small hiatal hernia. No bowel obstruction, free air, or pneumatosis is seen. A moderate amount of retained stool is noted in the colon. Appendix is not seen. Vascular/Lymphatic: Aortic atherosclerosis. No abdominal or pelvic lymphadenopathy. Reproductive: Status post hysterectomy. No adnexal masses. Other: No abdominopelvic ascites. A fat containing umbilical hernia is present. Musculoskeletal: Degenerative changes are noted in the lumbar spine and bilateral hips. There is ankylosis of the the T11 through L3 vertebral bodies. Spinal fusion hardware  is noted at L4-L5. No acute fracture is seen. IMPRESSION: 1. No renal calculus or obstructive uropathy bilaterally. 2. Multilevel degenerative changes in the thoracolumbar spine without evidence of acute fracture. 3. Moderate amount of retained stool in the colon suggesting constipation. 4. Small hiatal hernia. 5. Aortic atherosclerosis and coronary artery calcifications. Electronically Signed   By: Leita Birmingham M.D.   On: 06/28/2024 16:02   CT T-SPINE NO CHARGE Result Date: 06/28/2024 EXAM: CT THORACIC SPINE WITHOUT CONTRAST 06/28/2024 03:30:01 PM TECHNIQUE: CT of the thoracic spine was performed without the administration of intravenous contrast. Multiplanar reformatted images are provided for review. Automated exposure control, iterative reconstruction, and/or weight based adjustment of the mA/kV was utilized to reduce the radiation dose to as low as reasonably achievable. COMPARISON: None available. CLINICAL HISTORY: Recently treated UTI with symptom improvement, now symptoms have returned of lower back pain, polyuria but only drops come out. Hx dementia, son at bedside. FINDINGS: BONES AND ALIGNMENT: Normal vertebral body heights. Remote superior endplate fracture is present at T8. No acute fracture or suspicious bone lesion. Exaggerated cervical thoracic kyphosis was present. Rightward curvature centered at T11. DEGENERATIVE CHANGES: Ankylosis is present across the disc spaces at T11-T12 and T12-L1. SOFT TISSUES: No acute abnormality. IMPRESSION: 1. No acute findings. 2. Exaggerated cervicothoracic kyphosis. 3. Rightward curvature centered at T11. 4. Ankylosis across the disc spaces at T11-12 and T12-L1. 5. Remote superior endplate fracture at T8. Electronically signed by: Lonni Necessary MD 06/28/2024 03:48 PM EDT RP Workstation: [id number]   CT L-SPINE NO CHARGE Result Date: 06/28/2024 EXAM: CT OF THE LUMBAR SPINE WITHOUT CONTRAST 06/28/2024 03:30:01 PM TECHNIQUE: CT of the lumbar spine was  performed without the administration of intravenous contrast. Multiplanar reformatted images are provided for review. Automated exposure control, iterative reconstruction, and/or weight based adjustment of the mA/kV was utilized to reduce the radiation dose to as low as reasonably achievable. COMPARISON: MRI of the lumbar spine 07/28/2024. CLINICAL HISTORY: Recently treated UTI with symptom improvement, now symptoms have returned of lower back pain, polyuria but only drops come out. Hx dementia, son at bedside. FINDINGS: BONES AND ALIGNMENT: Normal vertebral body heights. Solid fusion is present at L4-L5. An ankylosis of lower thoracic spine extends to L3. Chronic sclerotic changes are present at L3-L4. No acute fracture or suspicious bone lesion. Normal alignment. DEGENERATIVE CHANGES: Moderate central and right greater than left foraminal stenosis is present at L3-L4. Moderate left foraminal stenosis is present at L5-S1. SOFT TISSUES: Atherosclerotic changes are present in the aorta and branch vessels without aneurysm. A small hiatal hernia is present. No acute abnormality. IMPRESSION: 1. No acute findings. 2. Solid fusion at L4-5 and ankylosis of lower thoracic spine extending to L3. 3. Chronic sclerotic changes at L3-4 with moderate central and right greater than left foraminal stenosis. 4. Moderate left foraminal stenosis at L5-S1. Electronically signed by: Lonni Necessary MD 06/28/2024 03:46 PM EDT RP Workstation: [id number]   CT CERVICAL SPINE WO CONTRAST Result  Date: 06/28/2024 EXAM: CT CERVICAL SPINE WITHOUT CONTRAST 06/28/2024 03:30:01 PM TECHNIQUE: CT of the cervical spine was performed without the administration of intravenous contrast. Multiplanar reformatted images are provided for review. Automated exposure control, iterative reconstruction, and/or weight based adjustment of the mA/kV was utilized to reduce the radiation dose to as low as reasonably achievable. COMPARISON: CT of the cervical  spine 08/10/2008. CLINICAL HISTORY: Polytrauma, blunt. Recently treated UTI with symptom improvement, now symptoms have returned of lower back pain, polyuria but only drops come out. Hx dementia, son at bedside. FINDINGS: CERVICAL SPINE: BONES AND ALIGNMENT: Straightening of the normal cervical lordosis is again noted. Ankylosis is present across the disc spaces C2-C6. No acute fracture or traumatic malalignment. DEGENERATIVE CHANGES: Ankylosis is present across the disc spaces C2-C6. SOFT TISSUES: No prevertebral soft tissue swelling. IMPRESSION: 1. No acute abnormality of the cervical spine. 2. Ankylosis across the C2C6 disc spaces. Electronically signed by: Lonni Necessary MD 06/28/2024 03:42 PM EDT RP Workstation: [id number]   CT HEAD WO CONTRAST Result Date: 06/28/2024 EXAM: CT HEAD WITHOUT CONTRAST 06/28/2024 03:30:01 PM TECHNIQUE: CT of the head was performed without the administration of intravenous contrast. Automated exposure control, iterative reconstruction, and/or weight based adjustment of the mA/kV was utilized to reduce the radiation dose to as low as reasonably achievable. COMPARISON: CT head without contrast 08/31/2020. CLINICAL HISTORY: Head trauma, moderate-severe. Recently treated UTI with symptom improvement, now symptoms have returned of lower back pain, polyuria but only drops come out. Hx dementia, son at bedside. FINDINGS: BRAIN AND VENTRICLES: No acute hemorrhage. No evidence of acute infarct. No hydrocephalus. No extra-axial collection. No mass effect or midline shift. Moderate generalized atrophy and white matter changes are similar to the prior study. Minimal atherosclerotic changes are present within the cavernous internal carotid arteries bilaterally. No hyperdense vessel is present. ORBITS: Bilateral lens replacements are noted. The globes and orbits are otherwise within normal limits. SINUSES: No acute abnormality. SOFT TISSUES AND SKULL: No acute soft tissue abnormality.  No skull fracture. IMPRESSION: 1. No acute intracranial abnormality. 2. Moderate generalized atrophy and white matter changes, similar to the prior study. Electronically signed by: Lonni Necessary MD 06/28/2024 03:40 PM EDT RP Workstation: [id number]   (Echo, Carotid, EGD, Colonoscopy, ERCP)    Subjective: No complaints  Discharge Exam: Vitals:   07/10/24 0508 07/10/24 0634  BP: (!) 187/63 (!) 166/67  Pulse: 68   Resp: 15   Temp: 98.2 F (36.8 C)   SpO2: 99%    Vitals:   07/09/24 2036 07/09/24 2235 07/10/24 0508 07/10/24 0634  BP: (!) 163/63 (!) 153/63 (!) 187/63 (!) 166/67  Pulse: 83 77 68   Resp: 15 16 15    Temp: 98.4 F (36.9 C) 97.8 F (36.6 C) 98.2 F (36.8 C)   TempSrc: Oral Oral    SpO2: 98% 97% 99%   Weight:      Height:        General: Pt is alert, awake, not in acute distress Cardiovascular: RRR, S1/S2 +, no rubs, no gallops Respiratory: CTA bilaterally, no wheezing, no rhonchi Abdominal: Soft, NT, ND, bowel sounds + Extremities: no edema, no cyanosis    The results of significant diagnostics from this hospitalization (including imaging, microbiology, ancillary and laboratory) are listed below for reference.     Microbiology: No results found for this or any previous visit (from the past 240 hours).   Labs: BNP (last 3 results) No results for input(s): BNP in the last 8760 hours. Basic Metabolic Panel: Recent  Labs  Lab 07/03/24 1515 07/04/24 0339 07/05/24 0248 07/07/24 1048 07/08/24 0052  NA 131* 130* 133* 131* 130*  K 4.3 3.7 3.8 3.3* 4.7  CL 97* 98 101 96* 100  CO2 22 23 23 23 24   GLUCOSE 134* 83 109* 135* 137*  BUN 12 11 <5* <5* 9  CREATININE 1.07* 0.84 0.55 0.65 0.81  CALCIUM 8.9 8.4* 8.5* 8.6* 9.0  MG  --  1.8  --  1.7  --    Liver Function Tests: No results for input(s): AST, ALT, ALKPHOS, BILITOT, PROT, ALBUMIN in the last 168 hours. No results for input(s): LIPASE, AMYLASE in the last 168 hours. No results  for input(s): AMMONIA in the last 168 hours. CBC: Recent Labs  Lab 07/06/24 0230 07/07/24 1048 07/08/24 0052 07/09/24 0832 07/10/24 0138  WBC 6.8 7.4 7.8 7.8 8.3  HGB 8.2* 8.0* 7.2* 7.2* 8.9*  HCT 27.0* 27.8* 24.7* 25.0* 29.3*  MCV 73.4* 74.3* 74.0* 74.6* 75.9*  PLT 298 283 230 237 250   Cardiac Enzymes: No results for input(s): CKTOTAL, CKMB, CKMBINDEX, TROPONINI in the last 168 hours. BNP: Invalid input(s): POCBNP CBG: Recent Labs  Lab 07/07/24 1014  GLUCAP 173*   D-Dimer No results for input(s): DDIMER in the last 72 hours. Hgb A1c No results for input(s): HGBA1C in the last 72 hours. Lipid Profile No results for input(s): CHOL, HDL, LDLCALC, TRIG, CHOLHDL, LDLDIRECT in the last 72 hours. Thyroid  function studies No results for input(s): TSH, T4TOTAL, T3FREE, THYROIDAB in the last 72 hours.  Invalid input(s): FREET3 Anemia work up No results for input(s): VITAMINB12, FOLATE, FERRITIN, TIBC, IRON, RETICCTPCT in the last 72 hours. Urinalysis    Component Value Date/Time   COLORURINE YELLOW 06/28/2024 1437   APPEARANCEUR CLOUDY (A) 06/28/2024 1437   LABSPEC 1.017 06/28/2024 1437   PHURINE 5.0 06/28/2024 1437   GLUCOSEU NEGATIVE 06/28/2024 1437   HGBUR NEGATIVE 06/28/2024 1437   BILIRUBINUR NEGATIVE 06/28/2024 1437   BILIRUBINUR Negative 08/22/2023 1134   KETONESUR NEGATIVE 06/28/2024 1437   PROTEINUR NEGATIVE 06/28/2024 1437   UROBILINOGEN 0.2 08/22/2023 1134   NITRITE NEGATIVE 06/28/2024 1437   LEUKOCYTESUR LARGE (A) 06/28/2024 1437   Sepsis Labs Recent Labs  Lab 07/07/24 1048 07/08/24 0052 07/09/24 0832 07/10/24 0138  WBC 7.4 7.8 7.8 8.3   Microbiology No results found for this or any previous visit (from the past 240 hours).   Time coordinating discharge: Over 35 minutes  SIGNED:   Erle Odell Castor, MD  [facility] 07/10/2024, 9:23 AM Pager   If 7PM-7AM, please contact  night-coverage [email or web address] Password [email or web address]

## 2024-07-10 NOTE — Progress Notes (Signed)
 Patient''s order for telemetry expired. Message sent to on call provided about order reissue. Awaiting response.

## 2024-07-10 NOTE — TOC Transition Note (Signed)
 Transition of Care [facility]) - Discharge Note   Patient Details  Name: Amy Andrade MRN: [id number] Date of Birth: 1935-12-28  [facility] CM/SW Contact:  Alfonse JONELLE Rex, RN Phone Number: 07/10/2024, 10:47 AM   Clinical Narrative:   DC order to SNF [facility]). Myrene, admit coord w/SNF confirmed bed available today, RM 120. Patient's son, [name], notified of transfer, PTAR for transport. No further TOC needs identified at this time.     Final next level of care: Skilled Nursing Facility Barriers to Discharge: Barriers Resolved   Patient Goals and CMS Choice Patient states their goals for this hospitalization and ongoing recovery are:: Home CMS Medicare.gov Compare Post Acute Care list provided to:: Patient Represenative (must comment) ([name],[name] (Son)  [phone] [facility] Phone)) Choice offered to / list presented to : Adult Children [place] ownership interest in [facility].provided to:: Adult Children    Discharge Placement              Patient chooses bed at: [facility] Patient to be transferred to facility by: PTAR Name of family member notified: [name],[name] (Son)  [phone] [facility] Phone) Patient and family notified of of transfer: 07/10/24  Discharge Plan and Services Additional resources added to the After Visit Summary for     Discharge Planning Services: CM Consult Post Acute Care Choice: Home Health, Durable Medical Equipment          DME Arranged: Hospital bed DME Agency: [employer] Date DME Agency Contacted: 07/01/24     HH Arranged: PT, OT          Social Drivers of Health (SDOH) Interventions SDOH Screenings   Food Insecurity: No Food Insecurity (06/28/2024)  Housing: Low Risk  (06/28/2024)  Transportation Needs: No Transportation Needs (06/28/2024)  Utilities: Not At Risk (06/28/2024)  Depression (PHQ2-9): Low Risk  (05/21/2024)  Financial Resource Strain: Low Risk  (11/18/2017)   Physical Activity: Inactive (11/18/2017)  Social Connections: Socially Isolated (06/28/2024)  Stress: No Stress Concern Present (11/18/2017)  Tobacco Use: Medium Risk (07/02/2024)     Readmission Risk Interventions    07/10/2024   10:46 AM  Readmission Risk Prevention Plan  Transportation Screening Complete  PCP or Specialist Appt within 5-7 Days Complete  Home Care Screening Complete  Medication Review (RN CM) Complete

## 2024-07-10 NOTE — Telephone Encounter (Addendum)
 Auth Submission: NO AUTH NEEDED Site of care: [facility] Payer: [employer] Medicare, AARP Supplement Medication & CPT/J Code(s) submitted: Feraheme (ferumoxytol) [id number] Diagnosis Code: D63.0, D50.9, C18.9, D64.9 Route of submission (phone, fax, portal): portal Phone # Fax # Auth type: Buy/Bill HB Units/visits requested: 510mg  x 2 doses Reference number: [id number] Approval from: 07/10/24 to 09/16/24

## 2024-07-10 NOTE — Progress Notes (Signed)
 Patient discharged to [facility], discharge paperwork printed and placed in discharge packet to be sent with [facility], primary RN called receiving facility and gave report, IV removed, [facility] called by social work, awaiting [facility] to transport patient to [place].

## 2024-07-10 NOTE — NC FL2 (Signed)
 [place]  MEDICAID FL2 LEVEL OF CARE FORM     IDENTIFICATION  Patient Name: Amy Andrade Birthdate: 12/08/1935 Sex: female Admission Date (Current Location): 06/28/2024  [facility] and [place] Number:  Producer, television/film/video and Address:  [facility],  [address] [place], [place] [address]      Provider Number: [id number]  Attending Physician Name and Address:  Odell Celinda Balo, MD  Relative Name and Phone Number:  [name] [phone])  [phone] [facility] Phone)    Current Level of Care: Hospital Recommended Level of Care: Skilled Nursing Facility Prior Approval Number:    Date Approved/Denied:   PASRR Number: [id number]  Discharge Plan: SNF    Current Diagnoses: Patient Active Problem List   Diagnosis Date Noted   Adenocarcinoma of colon (HCC) 07/08/2024   Symptomatic anemia 06/28/2024   Chronic right shoulder pain 06/09/2024   S/P TKR (total knee replacement) using cement, left 09/22/2020   Leukopenia 09/08/2020   Unspecified protein-calorie malnutrition 09/08/2020   Neurocognitive deficits 09/08/2020   Femur fracture, left (HCC) 09/01/2020   Closed fracture of left distal femur (HCC) 08/31/2020   Coronavirus infection 08/31/2020   Chronic left shoulder pain 04/25/2020   Chronic bilateral low back pain without sciatica 04/25/2020   Uncomplicated alcohol dependence (HCC) 04/25/2020   Chronic hyponatremia 05/31/2016   Incidental lung nodule 05/30/2016   History of pulmonary embolism 05/30/2016   Alzheimer disease (HCC)    Asthma    Hyperlipidemia    Hypertension    Skin lesion of chest wall 06/22/2011    Orientation RESPIRATION BLADDER Height & Weight     Self  Normal Incontinent, External catheter Weight: 79.3 kg Height:  5' 10 (177.8 cm)  BEHAVIORAL SYMPTOMS/MOOD NEUROLOGICAL BOWEL NUTRITION STATUS      Incontinent Diet (soft)  AMBULATORY STATUS COMMUNICATION OF NEEDS Skin   Extensive Assist Verbally Surgical wounds (Abdominal incision w/foam  dressing; Laparoscopy left and upper abdomen-liquid skin)                       Personal Care Assistance Level of Assistance  Bathing, Feeding, Dressing Bathing Assistance: Maximum assistance Feeding assistance: Limited assistance Dressing Assistance: Maximum assistance     Functional Limitations Info  Sight, Hearing, Speech Sight Info: Impaired (eyeglasses) Hearing Info: Impaired (hard of hearing) Speech Info: Adequate    SPECIAL CARE FACTORS FREQUENCY  PT (By licensed PT), OT (By licensed OT)     PT Frequency: 5x/wk OT Frequency: 5x/wk            Contractures Contractures Info: Not present    Additional Factors Info  Code Status, Allergies, Psychotropic Code Status Info: Full Code Allergies Info: NKA Psychotropic Info: N/A         Current Medications (07/10/2024):  This is the current hospital active medication list Current Facility-Administered Medications  Medication Dose Route Frequency Provider Last Rate Last Admin   0.9 %  sodium chloride  infusion (Manually program via Guardrails IV Fluids)   Intravenous Once Alday, Stephen R, CRNA       acetaminophen  (TYLENOL ) tablet 650 mg  650 mg Oral Q6H PRN Patel, Vishal R, MD   650 mg at 07/09/24 9973   Or   acetaminophen  (TYLENOL ) suppository 650 mg  650 mg Rectal Q6H PRN Patel, Vishal R, MD       bisacodyl (DULCOLAX) EC tablet 5 mg  5 mg Oral Daily PRN Patel, Vishal R, MD   5 mg at 07/03/24 2029   Chlorhexidine Gluconate  Cloth 2 % PADS 6 each  6 each Topical Daily Pahwani, Fredia, MD   6 each at 07/09/24 1000   feeding supplement (BOOST / RESOURCE BREEZE) liquid 1 Container  1 Container Oral TID BM Regalado, Belkys A, MD   1 Container at 07/08/24 2101   morphine  (PF) 2 MG/ML injection 1 mg  1 mg Intravenous Q4H PRN Metzger, Gregory A, MD   1 mg at 07/02/24 1954   ondansetron  (ZOFRAN ) tablet 4 mg  4 mg Oral Q6H PRN Patel, Vishal R, MD       Or   ondansetron  (ZOFRAN ) injection 4 mg  4 mg Intravenous Q6H PRN Patel,  Vishal R, MD   4 mg at 07/02/24 2022   Oral care mouth rinse  15 mL Mouth Rinse PRN Regalado, Belkys A, MD       pantoprazole (PROTONIX) EC tablet 40 mg  40 mg Oral BID Legge, Justin M, RPH   40 mg at 07/10/24 0824   polyethylene glycol (MIRALAX  / GLYCOLAX ) packet 17 g  17 g Oral Daily Mann, Jyothi, MD   17 g at 07/10/24 0824   pravastatin  (PRAVACHOL ) tablet 40 mg  40 mg Oral Daily Patel, Vishal R, MD   40 mg at 07/10/24 0824   senna-docusate (Senokot-S) tablet 1 tablet  1 tablet Oral QHS PRN Patel, Vishal R, MD   1 tablet at 07/03/24 2029   sodium chloride  flush (NS) 0.9 % injection 10-40 mL  10-40 mL Intracatheter Q12H Pahwani, Fredia, MD   10 mL at 07/09/24 1030   sodium chloride  flush (NS) 0.9 % injection 10-40 mL  10-40 mL Intracatheter PRN Vernon Fredia, MD       traMADol (ULTRAM) tablet 50 mg  50 mg Oral Q6H PRN Polly Cordella LABOR, MD   50 mg at 07/09/24 2117     Discharge Medications: Please see discharge summary for a list of discharge medications.  Relevant Imaging Results:  Relevant Lab Results:   Additional Information SSN: [id number]  Alfonse JONELLE Rex, RN

## 2024-07-10 NOTE — Progress Notes (Signed)
 Patient picked up by [facility] to be transported to [place].

## 2024-07-14 ENCOUNTER — Other Ambulatory Visit (HOSPITAL_COMMUNITY): Payer: Self-pay | Admitting: Pharmacy Technician

## 2024-07-20 ENCOUNTER — Encounter: Payer: Self-pay | Admitting: Radiology

## 2024-07-24 ENCOUNTER — Encounter (HOSPITAL_COMMUNITY)
Admission: RE | Admit: 2024-07-24 | Discharge: 2024-07-24 | Disposition: A | Discharge status: Home / Self Care | Source: Ambulatory Visit | Attending: Internal Medicine | Admitting: Internal Medicine

## 2024-07-24 VITALS — BP 123/58 | HR 69 | Temp 98.0°F | Resp 16

## 2024-07-24 DIAGNOSIS — D630 Anemia in neoplastic disease: Secondary | ICD-10-CM | POA: Insufficient documentation

## 2024-07-24 DIAGNOSIS — D649 Anemia, unspecified: Secondary | ICD-10-CM

## 2024-07-24 DIAGNOSIS — D509 Iron deficiency anemia, unspecified: Secondary | ICD-10-CM

## 2024-07-24 DIAGNOSIS — C189 Malignant neoplasm of colon, unspecified: Secondary | ICD-10-CM | POA: Insufficient documentation

## 2024-07-24 MED ORDER — SODIUM CHLORIDE 0.9 % IV SOLN
510.0000 mg | Freq: Once | INTRAVENOUS | Status: AC
  Administered 2024-07-24: 510 mg via INTRAVENOUS
  Filled 2024-07-24: qty 510

## 2024-07-27 ENCOUNTER — Telehealth: Payer: Self-pay

## 2024-07-27 NOTE — Telephone Encounter (Signed)
 Copied from CRM [id number]. Topic: General - Other >> Jul 27, 2024  9:05 AM Alfonso ORN wrote: Reason for CRM: Redell with [facility] called and the call got disconnected , agent did not get a chance to get the callback number  Want to do verbal orders    ----------------------------------------------------------------------- From previous Reason for Contact - Home Health Verbal Orders: Caller/Agency: [facility]  Callback Number:  Service Requested:   Frequency:  Any new concerns about the patient?

## 2024-07-27 NOTE — Telephone Encounter (Signed)
 Spoke with Amy Andrade and she gave me the number [phone] for [facility]. Called [facility], spoke with Hargis and she gave me Brian's number [phone].  Florence Rogue and gave verbal ok per [facility] standing protocol

## 2024-07-27 NOTE — Telephone Encounter (Signed)
 I called [name], patients son to see if he had a number for [place]. [name] asked that I call his wife who is at home with patient and she may be able to better assist.

## 2024-07-28 ENCOUNTER — Ambulatory Visit (INDEPENDENT_AMBULATORY_CARE_PROVIDER_SITE_OTHER): Admitting: Family

## 2024-07-28 ENCOUNTER — Encounter: Payer: Self-pay | Admitting: Family

## 2024-07-28 VITALS — BP 126/68 | HR 73 | Temp 97.8°F | Resp 18

## 2024-07-28 DIAGNOSIS — I10 Essential (primary) hypertension: Secondary | ICD-10-CM | POA: Diagnosis not present

## 2024-07-28 DIAGNOSIS — G8929 Other chronic pain: Secondary | ICD-10-CM

## 2024-07-28 DIAGNOSIS — G894 Chronic pain syndrome: Secondary | ICD-10-CM | POA: Diagnosis not present

## 2024-07-28 DIAGNOSIS — M25512 Pain in left shoulder: Secondary | ICD-10-CM | POA: Diagnosis not present

## 2024-07-28 DIAGNOSIS — D500 Iron deficiency anemia secondary to blood loss (chronic): Secondary | ICD-10-CM | POA: Insufficient documentation

## 2024-07-28 DIAGNOSIS — E782 Mixed hyperlipidemia: Secondary | ICD-10-CM | POA: Diagnosis not present

## 2024-07-28 DIAGNOSIS — Z23 Encounter for immunization: Secondary | ICD-10-CM | POA: Diagnosis not present

## 2024-07-28 DIAGNOSIS — D510 Vitamin B12 deficiency anemia due to intrinsic factor deficiency: Secondary | ICD-10-CM | POA: Insufficient documentation

## 2024-07-28 DIAGNOSIS — T81321A Disruption or dehiscence of closure of internal operation (surgical) wound of abdominal wall muscle or fascia, initial encounter: Secondary | ICD-10-CM

## 2024-07-28 DIAGNOSIS — K5901 Slow transit constipation: Secondary | ICD-10-CM

## 2024-07-28 MED ORDER — DOXYCYCLINE HYCLATE 100 MG PO TABS: 100.0000 mg | ORAL_TABLET | Freq: Two times a day (BID) | ORAL | 0 refills | Status: DC

## 2024-07-28 NOTE — Patient Instructions (Addendum)
 1.Report to local pharmacy to receive Shingles vaccine.  2. Home Health Nurse /POA to cleanse mid-abdominal wound with saline ,pat dry, applied and covered with foam dressing for extra protection and absorption.change dressing daily and as needed if soiled. Notify provider or surgeon for any signs of infection redness or discharge.

## 2024-07-28 NOTE — Progress Notes (Signed)
 Provider: Roxan Plough FNP-C   Simar Pothier, Roxan BROCKS, NP  Patient Care Team: Emie Sommerfeld, Roxan BROCKS, NP as PCP - General (Family Medicine) Cyrus Carwin, MD as Consulting Physician (Ophthalmology)  Extended Emergency Contact Information Primary Emergency Contact: Herschberger,[name] Address: [address]          [place], [place] [address] [place] Home Phone: [phone] Relation: Son Secondary Emergency Contact: Rogacki,[name] Address: [address] [place], [place] [address] [place] Home Phone: [phone] Relation: Other  Code Status: Full code Goals of care: Advanced Directive information    07/28/2024   10:43 AM  Advanced Directives  Does Patient Have a Medical Advance Directive? Yes  Type of Advance Directive Healthcare Power of Attorney  Does patient want to make changes to medical advance directive? No - Patient declined  Copy of Healthcare Power of Attorney in Chart? Yes - validated most recent copy scanned in chart (See row information)     Chief Complaint  Patient presents with   Transitions [facility] follow up discharge from rehab.Pt c/o not having a bowel movement in 2 days.    Discussed the use of AI scribe software for clinical note transcription with the patient, who gave verbal consent to proceed.  History of Present Illness   Amy Andrade is an 88 year old female who presents for hospital follow-up.  She is here with her son who provides most of the HPI information.He is concerned about a draining surgical abdominal wound.  She was hospitalized from October 12th to October 24th for a right colectomy due to a nonobstructive colonic mass, confirmed to be an invasive adenocarcinoma. She was discharged from [facility] rehab on Friday and is now presenting for follow-up care. Her caregiver is concerned about a small incision on her abdomen that has a hole and is draining. The wound is not hot to the  touch or swollen. No aftercare instructions were provided upon discharge from rehab, and the caregiver is unsure about the wound care required.No dressing has been applied since she was discharged from rehab.  She has not had a bowel movement in a day and a half, although she was able to go to the bathroom herself the day before yesterday. She experiences frequent urges to use the bathroom but is unable to pass stool. She has not taken any stool softeners or prune juice since returning home, as she was waiting for medical advice. Previously, prune juice was used to manage constipation caused by calcium gummies, but she ran out of it during her hospital stay.  During her hospitalization, she experienced syncope twice at home and once at the hospital, which seemed to occur after eating. It was suspected to be related to her blood pressure medication, which was held during her hospital stay. She received fluids for resuscitation and her medication was to be resumed as tolerated.  She has a history of Alzheimer's, essential hypertension, osteoarthritis of the shoulder, and chronic left shoulder dislocation. She was treated for a urinary tract infection and had generalized weakness with a hemoglobin level of 6.9, which improved to 8.9 after receiving two units of red blood cells and IV iron. She also had an acute kidney injury that resolved with IV fluids.  She was seen by physical and occupational therapy, with recommendations for skilled nursing facility care. Physical therapy has been  ongoing at home through [facility]. Her caregiver reports that she is able to get up from a lying to a sitting position with ease, which was noted by the physical therapist.    Past Medical History:  Diagnosis Date   Alzheimer disease (HCC)    Asthma    Cancer (HCC)    Hyperlipidemia    Hypertension    Pulmonary embolism (HCC) 2004   was short of breath--was on coumadin for 6 mos to a year   Past Surgical  History:  Procedure Laterality Date   ABDOMINAL HYSTERECTOMY     BUNIONECTOMY     CATARACT EXTRACTION Bilateral 06/17/2022   COLONOSCOPY Left 07/01/2024   Procedure: COLONOSCOPY;  Surgeon: Kristie Lamprey, MD;  Location: [facility];  Service: Gastroenterology;  Laterality: Left;  IDA   ESOPHAGOGASTRODUODENOSCOPY Left 07/01/2024   Procedure: EGD (ESOPHAGOGASTRODUODENOSCOPY);  Surgeon: Kristie Lamprey, MD;  Location: [facility];  Service: Gastroenterology;  Laterality: Left;  IDA   LAPAROSCOPIC RIGHT COLECTOMY N/A 07/02/2024   Procedure: COLECTOMY, RIGHT, LAPAROSCOPIC;  Surgeon: Polly Cordella LABOR, MD;  Location: [facility];  Service: General;  Laterality: N/A;   REPLACEMENT TOTAL KNEE  1980s, 2003   x2    No Known Allergies  Allergies as of 07/28/2024   No Known Allergies      Medication List        Accurate as of July 28, 2024  8:01 PM. If you have any questions, ask your nurse or doctor.          amLODipine  10 MG tablet Commonly known as: NORVASC  Take 1 tablet (10 mg total) by mouth daily. What changed: when to take this   aspirin  81 MG tablet Take 81 mg by mouth in the morning.   CALCIUM-VITAMIN D PO Take 1 tablet by mouth 2 (two) times daily.   doxycycline 100 MG tablet Commonly known as: VIBRA-TABS Take 1 tablet (100 mg total) by mouth 2 (two) times daily for 10 days. Started by: Rahn Lacuesta C Mervil Wacker   HYDROcodone -acetaminophen  5-325 MG tablet Commonly known as: NORCO/VICODIN Take 1 tablet by mouth every 12 (twelve) hours as needed for severe pain (pain score 7-10). What changed:  when to take this reasons to take this   irbesartan  300 MG tablet Commonly known as: AVAPRO  Take 1 tablet (300 mg total) by mouth daily. What changed: when to take this   methocarbamol  500 MG tablet Commonly known as: ROBAXIN  Take 1 tablet by mouth once daily What changed: when to take this   nitroGLYCERIN  0.4 MG SL tablet Commonly known as: NITROSTAT  Place 1 tablet (0.4 mg  total) under the tongue every 5 (five) minutes x 3 doses as needed for chest pain.   nystatin  cream Commonly known as: MYCOSTATIN  Apply 1 application  topically 2 (two) times daily. What changed:  when to take this reasons to take this   pravastatin  40 MG tablet Commonly known as: PRAVACHOL  Take 1 tablet (40 mg total) by mouth daily. What changed: when to take this   triamcinolone  cream 0.1 % Commonly known as: KENALOG  Apply 1 Application topically 2 (two) times daily. Affected area on right foot        Review of Systems  Constitutional:  Negative for appetite change, chills, fatigue, fever and unexpected weight change.  HENT:  Negative for congestion, ear discharge, ear pain, hearing loss, nosebleeds, postnasal drip, rhinorrhea, sinus pressure, sinus pain, sneezing, sore throat, tinnitus and trouble swallowing.   Eyes:  Negative for pain, discharge, redness, itching  and visual disturbance.  Respiratory:  Negative for cough, chest tightness, shortness of breath and wheezing.   Cardiovascular:  Negative for chest pain, palpitations and leg swelling.  Gastrointestinal:  Positive for constipation. Negative for abdominal distention, abdominal pain, blood in stool, diarrhea, nausea and vomiting.  Endocrine: Negative for cold intolerance, heat intolerance, polydipsia, polyphagia and polyuria.  Genitourinary:  Negative for difficulty urinating, dysuria, flank pain, frequency and urgency.       Urine incontinent   Musculoskeletal:  Positive for gait problem. Negative for arthralgias, back pain, joint swelling, myalgias, neck pain and neck stiffness.  Skin:  Positive for wound. Negative for color change, pallor and rash.  Neurological:  Negative for dizziness, syncope, speech difficulty, weakness, light-headedness, numbness and headaches.  Hematological:  Does not bruise/bleed easily.  Psychiatric/Behavioral:  Negative for agitation, behavioral problems, confusion, hallucinations and sleep  disturbance. The patient is not nervous/anxious.        Memory loss     Immunization History  Administered Date(s) Administered   Fluad Quad(high Dose 65+) 08/30/2021, 09/03/2022   INFLUENZA, HIGH DOSE SEASONAL PF 10/27/2018, 07/18/2020, 05/21/2024   Influenza,inj,Quad PF,6+ Mos 05/27/2015, 06/18/2016   Influenza-Unspecified 05/18/2017   PNEUMOCOCCAL CONJUGATE-20 07/28/2024   Pneumococcal Conjugate-13 10/11/2014   Pneumococcal Polysaccharide-23 01/20/2016   Pneumococcal-Unspecified 09/17/2020   Tdap 09/17/2010, 03/05/2023   Zoster Recombinant(Shingrix) 09/17/2020   Zoster, Live 07/22/2014   Pertinent  Health Maintenance Due  Topic Date Due   Influenza Vaccine  Completed   DEXA SCAN  Completed      03/05/2023    8:31 AM 03/08/2023    8:27 AM 12/06/2023    1:04 PM 05/21/2024   11:50 AM 07/28/2024   10:42 AM  Fall Risk  Falls in the past year? 0 0 0 1 0  Was there an injury with Fall? 0 0 0 1 0  Fall Risk Category Calculator 0 0 0 2 0  Patient at Risk for Falls Due to No Fall Risks No Fall Risks No Fall Risks No Fall Risks No Fall Risks  Fall risk Follow up Falls evaluation completed Falls evaluation completed Falls evaluation completed Falls evaluation completed Falls evaluation completed   Functional Status Survey:    Vitals:   07/28/24 1048  BP: 126/68  Pulse: 73  Resp: 18  Temp: 97.8 F (36.6 C)  SpO2: 98%   There is no height or weight on file to calculate BMI. Physical Exam  VITALS: T- 97.8, P- 73, BP- 126/68, SaO2- 98% GENERAL: Alert, cooperative, well developed, no acute distress. HEENT: Normocephalic, normal oropharynx, moist mucous membranes. CHEST: Clear to auscultation bilaterally, no wheezes, rhonchi, or crackles. CARDIOVASCULAR: Normal heart rate and rhythm, S1 and S2 normal without murmurs. ABDOMEN: Soft, non-tender, non-distended, without organomegaly, normal bowel sounds. Abdominal incision with drainage and surrounding redness. No costovertebral  angle tenderness. EXTREMITIES: No cyanosis or edema. NEUROLOGICAL: Cranial nerves grossly intact, moves all extremities without gross motor or sensory deficit.  SKIN: No rash,no lesion or erythema  Midline abdominal surgical incision with small open area on mid incision draining whitish yellow thick pus without any odor.surrounding skin tissue with erythema but not warm to touch.Incision cleansed with saline,pat dry with 4 x 4 gauze and covered with gauze for protection and absorption.  PSYCHIATRY/BEHAVIORAL: Mood stable    Labs reviewed: Recent Labs    06/28/24 1352 06/29/24 0524 07/04/24 0339 07/05/24 0248 07/07/24 1048 07/08/24 0052  NA 131*   < > 130* 133* 131* 130*  K 4.4   < >  3.7 3.8 3.3* 4.7  CL 97*   < > 98 101 96* 100  CO2 23   < > 23 23 23 24   GLUCOSE 108*   < > 83 109* 135* 137*  BUN 23   < > 11 <5* <5* 9  CREATININE 1.09*   < > 0.84 0.55 0.65 0.81  CALCIUM 9.6   < > 8.4* 8.5* 8.6* 9.0  MG 2.5*  --  1.8  --  1.7  --    < > = values in this interval not displayed.   Recent Labs    02/25/24 0809 06/28/24 1352  AST 11 18  ALT 7 10  ALKPHOS  --  68  BILITOT 0.5 0.6  PROT 6.0* 6.7  ALBUMIN  --  4.1   Recent Labs    06/30/24 0844 07/01/24 0621 07/02/24 0536 07/03/24 1515 07/08/24 0052 07/09/24 0832 07/10/24 0138  WBC 6.9 6.8 7.9   < > 7.8 7.8 8.3  NEUTROABS 4.7 4.4 5.7  --   --   --   --   HGB 9.4* 8.7* 8.7*   < > 7.2* 7.2* 8.9*  HCT 32.5* 31.1* 29.2*   < > 24.7* 25.0* 29.3*  MCV 73.4* 74.9* 74.5*   < > 74.0* 74.6* 75.9*  PLT 462* 413* 425*   < > 230 237 250   < > = values in this interval not displayed.   Lab Results  Component Value Date   TSH 1.60 02/25/2024   Lab Results  Component Value Date   HGBA1C 5.3 03/08/2021   Lab Results  Component Value Date   CHOL 179 02/25/2024   HDL 101 02/25/2024   LDLCALC 65 02/25/2024   TRIG 54 02/25/2024   CHOLHDL 1.8 02/25/2024    Significant Diagnostic Results in last 30 days:  CT CHEST ABDOMEN  PELVIS W CONTRAST Result Date: 07/02/2024 EXAM: CT CHEST, ABDOMEN AND PELVIS WITH CONTRAST 07/02/2024 11:22:16 AM TECHNIQUE: CT of the chest, abdomen and pelvis was performed with the administration of 100 mL (iohexol (OMNIPAQUE) 300 MG/ML solution 100 mL IOHEXOL 300 MG/ML SOLN) intravenous contrast. Multiplanar reformatted images are provided for review. Automated exposure control, iterative reconstruction, and/or weight based adjustment of the mA/kV was utilized to reduce the radiation dose to as low as reasonably achievable. COMPARISON: 06/28/2024 CLINICAL HISTORY: Metastatic disease evaluation. Per Dr.Pahwani notes:; Brief Narrative: ; CHANIYAH JAHR is a 88 y.o. female with medical history significant for Alzheimer's dementia, HTN, HLD, chronic pain due to OA of both shoulders and chronic left shoulder dislocation who presented to the ED for evaluation of generalized weakness and ; no other complaint. She notes frequent urinary urgency. She was recently treated for UTI initially with ciprofloxacin . Urine culture grew Enterococcus faecalis and antibiotics were switched to nitrofurantoin  which she has since completed FINDINGS: CHEST: MEDIASTINUM AND LYMPH NODES: Heart and pericardium are unremarkable. The central airways are clear. No mediastinal, hilar or axillary lymphadenopathy. Mild thoracic aortic atherosclerosis. Mild coronary atherosclerosis of the LAD and right coronary artery. LUNGS AND PLEURA: No focal consolidation or pulmonary edema. No pleural effusion or pneumothorax. Mild scarring in the bilateral lower lobes. ABDOMEN AND PELVIS: LIVER: The liver is unremarkable. GALLBLADDER AND BILE DUCTS: Gallbladder is unremarkable. No biliary ductal dilatation. SPLEEN: No acute abnormality. PANCREAS: No acute abnormality. ADRENAL GLANDS: No acute abnormality. KIDNEYS, URETERS AND BLADDER: No stones in the kidneys or ureters. No hydronephrosis. No perinephric or periureteral stranding. Urinary bladder is  unremarkable. GI AND BOWEL: Stomach demonstrates  no acute abnormality. There is no bowel obstruction. REPRODUCTIVE ORGANS: Status post hysterectomy with new fluid within the vaginal apex (image 101). PERITONEUM AND RETROPERITONEUM: No ascites. No free air. VASCULATURE: Aorta is normal in caliber. Atherosclerotic calcifications of the abdominal aorta and branch vessels, although patent. ABDOMINAL AND PELVIS LYMPH NODES: No lymphadenopathy. BONES AND SOFT TISSUES: Severe degenerative changes of the right shoulder. Mild degenerative changes of the left shoulder with anterior subluxation. Degenerative changes of the visualized thoracolumbar spine. Status post L4-L5 PLIF. S-shaped thoracolumbar scoliosis. No acute osseous abnormality. No focal soft tissue abnormality. IMPRESSION: 1. Fluid in the vaginal apex, new from recent prior. Consider GYN evaluation. 2. Otherwise unchanged from recent CT. Chronic ancillary findings as above. 3. No findings suspicious for primary malignancy or metastatic disease. Electronically signed by: Pinkie Pebbles MD 07/02/2024 08:23 PM EDT RP Workstation: [id number]    Assessment/Plan  Disruption of abdominal surgical wound with drainage Post-surgical wound disruption with drainage and redness, no signs of infection such as heat or swelling. Concern for potential infection due to drainage. - Applied dry gauze dressing to absorb drainage. - Instructed to change dressing at least once daily or if it becomes wet. - Started doxycycline 100 mg orally twice daily for 7 days. - Instructed to contact surgeon regarding wound drainage. - Ordered home health nurse to evaluate wound. - secured message sent to surgeon Dr.Metzger Cordella to notify of patient incision wound condition.   Colonic adenocarcinoma, post right colectomy, pending oncology follow-up Post right colectomy for colonic adenocarcinoma. Awaiting oncology follow-up for further management. Specimen confirmed  adenocarcinoma, moderately differentiated, with CEA level of 2.9. - Await oncology follow-up for further management. - Instructed to contact provider if no oncology follow-up within two weeks.  Constipation after colectomy Constipation post-colectomy with no bowel movement in 1.5 days. Previous use of prune juice was effective. - Administer prune juice or Miralax  as needed for constipation.  Generalized weakness, post-hospitalization Generalized weakness post-hospitalization. Physical therapy initiated at rehab facility. - Continue physical therapy as per rehab facility recommendations.  Alzheimer's disease with dementia Alzheimer's disease with dementia. - Continue supportive care for memory.  Essential hypertension Blood pressure well-controlled at 126/68 mmHg. Previous episodes of syncope possibly related to blood pressure medication. - Continue current blood pressure management.  Mixed hyperlipidemia Managed with statin therapy. - Continue statin therapy.  Pain in left shoulder Chronic left shoulder pain, possibly related to osteoarthritis.  Encounter for immunization Pneumonia vaccine administered. Shingles vaccine to be obtained from pharmacy. - Administered pneumonia vaccine. - Obtain shingles vaccine from pharmacy.   Family/ staff Communication: Reviewed plan of care with patient verbalized  Labs/tests ordered:  - CBC/diff - CMP   Next Appointment : Return if symptoms worsen or fail to improve.  Spent 40 minutes of Face to face and non-face to face with patient  >50% time spent counseling; reviewing medical record; tests; labs; documentation and developing future plan of care.   Roxan JAYSON Plough, NP

## 2024-07-29 ENCOUNTER — Ambulatory Visit: Payer: Self-pay | Admitting: Family

## 2024-07-29 ENCOUNTER — Inpatient Hospital Stay (HOSPITAL_COMMUNITY)
Admission: EM | Admit: 2024-07-29 | Discharge: 2024-08-04 | DRG: 862 | Disposition: A | Discharge status: Hospice | Attending: Internal Medicine | Admitting: Internal Medicine

## 2024-07-29 ENCOUNTER — Emergency Department (HOSPITAL_COMMUNITY)

## 2024-07-29 ENCOUNTER — Encounter (HOSPITAL_COMMUNITY): Payer: Self-pay

## 2024-07-29 ENCOUNTER — Other Ambulatory Visit: Payer: Self-pay

## 2024-07-29 ENCOUNTER — Ambulatory Visit: Payer: Self-pay

## 2024-07-29 DIAGNOSIS — Z9181 History of falling: Secondary | ICD-10-CM

## 2024-07-29 DIAGNOSIS — Z9049 Acquired absence of other specified parts of digestive tract: Secondary | ICD-10-CM

## 2024-07-29 DIAGNOSIS — Z87891 Personal history of nicotine dependence: Secondary | ICD-10-CM

## 2024-07-29 DIAGNOSIS — Z9842 Cataract extraction status, left eye: Secondary | ICD-10-CM

## 2024-07-29 DIAGNOSIS — T8130XA Disruption of wound, unspecified, initial encounter: Secondary | ICD-10-CM | POA: Diagnosis present

## 2024-07-29 DIAGNOSIS — E8721 Acute metabolic acidosis: Secondary | ICD-10-CM | POA: Diagnosis present

## 2024-07-29 DIAGNOSIS — E876 Hypokalemia: Secondary | ICD-10-CM | POA: Diagnosis not present

## 2024-07-29 DIAGNOSIS — Z66 Do not resuscitate: Secondary | ICD-10-CM | POA: Diagnosis not present

## 2024-07-29 DIAGNOSIS — J45909 Unspecified asthma, uncomplicated: Secondary | ICD-10-CM | POA: Diagnosis present

## 2024-07-29 DIAGNOSIS — E871 Hypo-osmolality and hyponatremia: Secondary | ICD-10-CM | POA: Diagnosis present

## 2024-07-29 DIAGNOSIS — R6520 Severe sepsis without septic shock: Secondary | ICD-10-CM | POA: Diagnosis present

## 2024-07-29 DIAGNOSIS — C189 Malignant neoplasm of colon, unspecified: Secondary | ICD-10-CM | POA: Diagnosis present

## 2024-07-29 DIAGNOSIS — K5641 Fecal impaction: Principal | ICD-10-CM | POA: Diagnosis present

## 2024-07-29 DIAGNOSIS — Z96659 Presence of unspecified artificial knee joint: Secondary | ICD-10-CM | POA: Diagnosis present

## 2024-07-29 DIAGNOSIS — D509 Iron deficiency anemia, unspecified: Secondary | ICD-10-CM | POA: Diagnosis present

## 2024-07-29 DIAGNOSIS — Z86711 Personal history of pulmonary embolism: Secondary | ICD-10-CM

## 2024-07-29 DIAGNOSIS — G309 Alzheimer's disease, unspecified: Secondary | ICD-10-CM | POA: Diagnosis present

## 2024-07-29 DIAGNOSIS — Z515 Encounter for palliative care: Secondary | ICD-10-CM

## 2024-07-29 DIAGNOSIS — A419 Sepsis, unspecified organism: Secondary | ICD-10-CM | POA: Diagnosis not present

## 2024-07-29 DIAGNOSIS — Z79899 Other long term (current) drug therapy: Secondary | ICD-10-CM

## 2024-07-29 DIAGNOSIS — Z820 Family history of epilepsy and other diseases of the nervous system: Secondary | ICD-10-CM

## 2024-07-29 DIAGNOSIS — Z4800 Encounter for change or removal of nonsurgical wound dressing: Secondary | ICD-10-CM

## 2024-07-29 DIAGNOSIS — F05 Delirium due to known physiological condition: Secondary | ICD-10-CM | POA: Diagnosis present

## 2024-07-29 DIAGNOSIS — I10 Essential (primary) hypertension: Secondary | ICD-10-CM | POA: Diagnosis present

## 2024-07-29 DIAGNOSIS — E538 Deficiency of other specified B group vitamins: Secondary | ICD-10-CM | POA: Diagnosis present

## 2024-07-29 DIAGNOSIS — Z9841 Cataract extraction status, right eye: Secondary | ICD-10-CM

## 2024-07-29 DIAGNOSIS — Y838 Other surgical procedures as the cause of abnormal reaction of the patient, or of later complication, without mention of misadventure at the time of the procedure: Secondary | ICD-10-CM | POA: Diagnosis present

## 2024-07-29 DIAGNOSIS — E785 Hyperlipidemia, unspecified: Secondary | ICD-10-CM | POA: Diagnosis present

## 2024-07-29 DIAGNOSIS — T8144XA Sepsis following a procedure, initial encounter: Principal | ICD-10-CM | POA: Diagnosis present

## 2024-07-29 DIAGNOSIS — Z825 Family history of asthma and other chronic lower respiratory diseases: Secondary | ICD-10-CM

## 2024-07-29 DIAGNOSIS — K449 Diaphragmatic hernia without obstruction or gangrene: Secondary | ICD-10-CM | POA: Diagnosis present

## 2024-07-29 DIAGNOSIS — Z8249 Family history of ischemic heart disease and other diseases of the circulatory system: Secondary | ICD-10-CM

## 2024-07-29 DIAGNOSIS — Z7982 Long term (current) use of aspirin: Secondary | ICD-10-CM

## 2024-07-29 DIAGNOSIS — L03311 Cellulitis of abdominal wall: Secondary | ICD-10-CM | POA: Diagnosis present

## 2024-07-29 DIAGNOSIS — Z90710 Acquired absence of both cervix and uterus: Secondary | ICD-10-CM

## 2024-07-29 DIAGNOSIS — F0280 Dementia in other diseases classified elsewhere without behavioral disturbance: Secondary | ICD-10-CM | POA: Diagnosis present

## 2024-07-29 LAB — COMPLETE METABOLIC PANEL WITHOUT GFR
AG Ratio: 1.5 (calc) (ref 1.0–2.5)
ALT: 10 U/L (ref 6–29)
AST: 16 U/L (ref 10–35)
Albumin: 3.8 g/dL (ref 3.6–5.1)
Alkaline phosphatase (APISO): 54 U/L (ref 37–153)
BUN: 14 mg/dL (ref 7–25)
CO2: 25 mmol/L (ref 20–32)
Calcium: 9.7 mg/dL (ref 8.6–10.4)
Chloride: 101 mmol/L (ref 98–110)
Creat: 0.81 mg/dL (ref 0.60–0.95)
Globulin: 2.5 g/dL (ref 1.9–3.7)
Glucose, Bld: 110 mg/dL — ABNORMAL HIGH (ref 65–99)
Potassium: 4.6 mmol/L (ref 3.5–5.3)
Sodium: 138 mmol/L (ref 135–146)
Total Bilirubin: 0.7 mg/dL (ref 0.2–1.2)
Total Protein: 6.3 g/dL (ref 6.1–8.1)

## 2024-07-29 LAB — CBC WITH DIFFERENTIAL/PLATELET
Abs Immature Granulocytes: 0.08 K/uL — ABNORMAL HIGH (ref 0.00–0.07)
Absolute Lymphocytes: 1082 {cells}/uL (ref 850–3900)
Absolute Monocytes: 704 {cells}/uL (ref 200–950)
Basophils Absolute: 44 {cells}/uL (ref 0–200)
Basophils Absolute: 0 K/uL (ref 0.0–0.1)
Basophils Relative: 0.5 %
Basophils Relative: 0 %
Eosinophils Absolute: 62 {cells}/uL (ref 15–500)
Eosinophils Absolute: 0 K/uL (ref 0.0–0.5)
Eosinophils Relative: 0.7 %
Eosinophils Relative: 0 %
HCT: 32.8 % — ABNORMAL LOW (ref 35.0–45.0)
HCT: 34.2 % — ABNORMAL LOW (ref 36.0–46.0)
Hemoglobin: 10 g/dL — ABNORMAL LOW (ref 11.7–15.5)
Hemoglobin: 10.7 g/dL — ABNORMAL LOW (ref 12.0–15.0)
Immature Granulocytes: 1 %
Lymphocytes Relative: 8 %
Lymphs Abs: 1 K/uL (ref 0.7–4.0)
MCH: 24 pg — ABNORMAL LOW (ref 27.0–33.0)
MCH: 24.5 pg — ABNORMAL LOW (ref 26.0–34.0)
MCHC: 30.5 g/dL — ABNORMAL LOW (ref 32.0–36.0)
MCHC: 31.3 g/dL (ref 30.0–36.0)
MCV: 78.7 fL — ABNORMAL LOW (ref 80.0–100.0)
MCV: 78.3 fL — ABNORMAL LOW (ref 80.0–100.0)
MPV: 9.9 fL (ref 7.5–12.5)
Monocytes Absolute: 1.1 K/uL — ABNORMAL HIGH (ref 0.1–1.0)
Monocytes Relative: 8 %
Monocytes Relative: 8 %
Neutro Abs: 6908 {cells}/uL (ref 1500–7800)
Neutro Abs: 11 K/uL — ABNORMAL HIGH (ref 1.7–7.7)
Neutrophils Relative %: 78.5 %
Neutrophils Relative %: 83 %
Platelets: 350 Thousand/uL (ref 140–400)
Platelets: 337 K/uL (ref 150–400)
RBC: 4.17 Million/uL (ref 3.80–5.10)
RBC: 4.37 MIL/uL (ref 3.87–5.11)
RDW: 22.5 % — ABNORMAL HIGH (ref 11.0–15.0)
RDW: 25.9 % — ABNORMAL HIGH (ref 11.5–15.5)
Total Lymphocyte: 12.3 %
WBC: 8.8 Thousand/uL (ref 3.8–10.8)
WBC: 13.2 K/uL — ABNORMAL HIGH (ref 4.0–10.5)
nRBC: 0 % (ref 0.0–0.2)

## 2024-07-29 LAB — COMPREHENSIVE METABOLIC PANEL WITH GFR
ALT: 13 U/L (ref 0–44)
AST: 27 U/L (ref 15–41)
Albumin: 4.2 g/dL (ref 3.5–5.0)
Alkaline Phosphatase: 73 U/L (ref 38–126)
Anion gap: 14 (ref 5–15)
BUN: 16 mg/dL (ref 8–23)
CO2: 21 mmol/L — ABNORMAL LOW (ref 22–32)
Calcium: 9.9 mg/dL (ref 8.9–10.3)
Chloride: 98 mmol/L (ref 98–111)
Creatinine, Ser: 1.04 mg/dL — ABNORMAL HIGH (ref 0.44–1.00)
GFR, Estimated: 52 mL/min — ABNORMAL LOW (ref >60)
Glucose, Bld: 112 mg/dL — ABNORMAL HIGH (ref 70–99)
Potassium: 4.5 mmol/L (ref 3.5–5.1)
Sodium: 133 mmol/L — ABNORMAL LOW (ref 135–145)
Total Bilirubin: 0.7 mg/dL (ref 0.0–1.2)
Total Protein: 7.4 g/dL (ref 6.5–8.1)

## 2024-07-29 LAB — URINALYSIS, ROUTINE W REFLEX MICROSCOPIC
Bilirubin Urine: NEGATIVE
Glucose, UA: NEGATIVE mg/dL
Hgb urine dipstick: NEGATIVE
Ketones, ur: 5 mg/dL — AB
Leukocytes,Ua: NEGATIVE
Nitrite: NEGATIVE
Protein, ur: NEGATIVE mg/dL
Specific Gravity, Urine: 1.015 (ref 1.005–1.030)
pH: 5 (ref 5.0–8.0)

## 2024-07-29 LAB — LIPASE, BLOOD: Lipase: 26 U/L (ref 11–51)

## 2024-07-29 LAB — I-STAT CHEM 8, ED
BUN: 12 mg/dL (ref 8–23)
Calcium, Ion: 1.09 mmol/L — ABNORMAL LOW (ref 1.15–1.40)
Chloride: 99 mmol/L (ref 98–111)
Creatinine, Ser: 0.9 mg/dL (ref 0.44–1.00)
Glucose, Bld: 104 mg/dL — ABNORMAL HIGH (ref 70–99)
HCT: 35 % — ABNORMAL LOW (ref 36.0–46.0)
Hemoglobin: 11.9 g/dL — ABNORMAL LOW (ref 12.0–15.0)
Potassium: 3.9 mmol/L (ref 3.5–5.1)
Sodium: 131 mmol/L — ABNORMAL LOW (ref 135–145)
TCO2: 20 mmol/L — ABNORMAL LOW (ref 22–32)

## 2024-07-29 LAB — I-STAT CG4 LACTIC ACID, ED
Lactic Acid, Venous: 2.5 mmol/L (ref 0.5–1.9)
Lactic Acid, Venous: 0.7 mmol/L (ref 0.5–1.9)

## 2024-07-29 LAB — CBC MORPHOLOGY

## 2024-07-29 MED ORDER — ALBUTEROL SULFATE (2.5 MG/3ML) 0.083% IN NEBU
2.5000 mg | INHALATION_SOLUTION | RESPIRATORY_TRACT | Status: DC | PRN
Start: 2024-07-29 — End: 2024-08-04

## 2024-07-29 MED ORDER — LACTATED RINGERS IV SOLN: INTRAVENOUS | Status: DC

## 2024-07-29 MED ORDER — ONDANSETRON HCL 4 MG/2ML IJ SOLN: 4.0000 mg | Freq: Four times a day (QID) | INTRAMUSCULAR | Status: DC | PRN

## 2024-07-29 MED ORDER — ACETAMINOPHEN 325 MG PO TABS
650.0000 mg | ORAL_TABLET | Freq: Four times a day (QID) | ORAL | Status: DC | PRN
  Administered 2024-08-01 – 2024-08-02 (×3): 650 mg via ORAL
  Filled 2024-07-29 (×3): qty 2

## 2024-07-29 MED ORDER — AMLODIPINE BESYLATE 10 MG PO TABS
10.0000 mg | ORAL_TABLET | Freq: Every day | ORAL | Status: DC
  Administered 2024-07-30 – 2024-08-04 (×6): 10 mg via ORAL
  Filled 2024-07-29 (×6): qty 1

## 2024-07-29 MED ORDER — METRONIDAZOLE 500 MG/100ML IV SOLN
500.0000 mg | Freq: Once | INTRAVENOUS | Status: AC
  Administered 2024-07-29: 500 mg via INTRAVENOUS
  Filled 2024-07-29: qty 100

## 2024-07-29 MED ORDER — TRAZODONE HCL 50 MG PO TABS
25.0000 mg | ORAL_TABLET | Freq: Every evening | ORAL | Status: DC | PRN
  Administered 2024-07-31 – 2024-08-03 (×4): 25 mg via ORAL
  Filled 2024-07-29 (×4): qty 1

## 2024-07-29 MED ORDER — FLEET ENEMA RE ENEM
1.0000 | ENEMA | Freq: Once | RECTAL | Status: AC
  Administered 2024-07-29: 1 via RECTAL
  Filled 2024-07-29: qty 1

## 2024-07-29 MED ORDER — ACETAMINOPHEN 500 MG PO TABS
1000.0000 mg | ORAL_TABLET | Freq: Once | ORAL | Status: AC
  Administered 2024-07-29: 1000 mg via ORAL
  Filled 2024-07-29: qty 2

## 2024-07-29 MED ORDER — BISACODYL 10 MG RE SUPP: 10.0000 mg | Freq: Every day | RECTAL | Status: DC | PRN

## 2024-07-29 MED ORDER — SODIUM CHLORIDE 0.9 % IV SOLN
2.0000 g | Freq: Once | INTRAVENOUS | Status: AC
  Administered 2024-07-29: 2 g via INTRAVENOUS
  Filled 2024-07-29: qty 20

## 2024-07-29 MED ORDER — ACETAMINOPHEN 650 MG RE SUPP
650.0000 mg | Freq: Four times a day (QID) | RECTAL | Status: DC | PRN
Start: 2024-07-29 — End: 2024-08-04

## 2024-07-29 MED ORDER — ENOXAPARIN SODIUM 40 MG/0.4ML IJ SOSY
40.0000 mg | PREFILLED_SYRINGE | INTRAMUSCULAR | Status: DC
  Administered 2024-07-30 – 2024-08-04 (×6): 40 mg via SUBCUTANEOUS
  Filled 2024-07-29 (×6): qty 0.4

## 2024-07-29 MED ORDER — METRONIDAZOLE 500 MG/100ML IV SOLN
500.0000 mg | Freq: Two times a day (BID) | INTRAVENOUS | Status: DC
  Administered 2024-07-30 – 2024-07-31 (×3): 500 mg via INTRAVENOUS
  Filled 2024-07-29 (×3): qty 100

## 2024-07-29 MED ORDER — LACTATED RINGERS IV BOLUS
500.0000 mL | Freq: Once | INTRAVENOUS | Status: AC
  Administered 2024-07-29: 500 mL via INTRAVENOUS

## 2024-07-29 MED ORDER — ASPIRIN 81 MG PO TBEC
81.0000 mg | DELAYED_RELEASE_TABLET | Freq: Every day | ORAL | Status: DC
  Administered 2024-07-30 – 2024-08-04 (×6): 81 mg via ORAL
  Filled 2024-07-29 (×6): qty 1

## 2024-07-29 MED ORDER — POLYETHYLENE GLYCOL 3350 17 G PO PACK: 17.0000 g | PACK | Freq: Every day | ORAL | Status: DC | PRN

## 2024-07-29 MED ORDER — SODIUM CHLORIDE (PF) 0.9 % IJ SOLN
INTRAMUSCULAR | Status: AC
  Filled 2024-07-29: qty 50

## 2024-07-29 MED ORDER — IRBESARTAN 300 MG PO TABS
300.0000 mg | ORAL_TABLET | Freq: Every day | ORAL | Status: DC
  Administered 2024-07-30 – 2024-08-04 (×6): 300 mg via ORAL
  Filled 2024-07-29 (×6): qty 1

## 2024-07-29 MED ORDER — PRAVASTATIN SODIUM 40 MG PO TABS
40.0000 mg | ORAL_TABLET | Freq: Every day | ORAL | Status: DC
  Administered 2024-07-30 – 2024-08-04 (×6): 40 mg via ORAL
  Filled 2024-07-29 (×6): qty 1

## 2024-07-29 MED ORDER — LACTATED RINGERS IV SOLN: INTRAVENOUS | Status: AC

## 2024-07-29 MED ORDER — IOHEXOL 300 MG/ML  SOLN
100.0000 mL | Freq: Once | INTRAMUSCULAR | Status: AC | PRN
  Administered 2024-07-29: 100 mL via INTRAVENOUS

## 2024-07-29 MED ORDER — VANCOMYCIN HCL IN DEXTROSE 1-5 GM/200ML-% IV SOLN
1000.0000 mg | Freq: Once | INTRAVENOUS | Status: AC
  Administered 2024-07-29: 1000 mg via INTRAVENOUS
  Filled 2024-07-29: qty 200

## 2024-07-29 MED ORDER — DOCUSATE SODIUM 100 MG PO CAPS
100.0000 mg | ORAL_CAPSULE | Freq: Two times a day (BID) | ORAL | Status: DC
  Administered 2024-07-29 – 2024-08-03 (×10): 100 mg via ORAL
  Filled 2024-07-29 (×11): qty 1

## 2024-07-29 MED ORDER — ONDANSETRON HCL 4 MG PO TABS: 4.0000 mg | ORAL_TABLET | Freq: Four times a day (QID) | ORAL | Status: DC | PRN

## 2024-07-29 MED ORDER — SODIUM CHLORIDE 0.9 % IV SOLN
1.0000 g | INTRAVENOUS | Status: DC
  Administered 2024-07-30 – 2024-07-31 (×2): 1 g via INTRAVENOUS
  Filled 2024-07-29 (×2): qty 10

## 2024-07-29 NOTE — Telephone Encounter (Signed)
 Agree with plan to send to ED for urine retention and constipation.

## 2024-07-29 NOTE — ED Notes (Signed)
 PT resting in bed comfortably.

## 2024-07-29 NOTE — ED Notes (Signed)
 PT laying down comfortably.

## 2024-07-29 NOTE — ED Triage Notes (Signed)
 Pt arrives by EMS from home due to bowel and urine incot. Pt states she has urge to go but can't. No BM in 2 days. Surgery 10/17 to remove mass on colon. Pt went to PCP yesterday and PCP believed site was infected and antibiotics started. Denies N/V.  Hx. Dementia. Rotator cuff dislocation - c/o shoulder pain. A/o x3 - baseline.

## 2024-07-29 NOTE — ED Provider Notes (Signed)
 Assumed care from Dr. Randol at 5 PM.  Patient here due to constipation and inability to use the bathroom as well as being more confused than baseline.  CT scan with evidence of constipation but no other acute findings.  Patient does have an abdominal wound with some mild erythema around it and ongoing drainage but no signs of abscess per CT read.  Patient does have a white count of 13 but no evidence of a UTI.  On repeat evaluation patient has still not had significant stool.  Another manual disimpaction was attempted but patient felt very hot to the touch.  Rectal temperature was 101.8.  Unclear where fever is coming from but given patient's age, recent surgery, abdominal wound feel that she needs admission for antibiotics.  Urine culture and blood cultures were done.  Lactic acid done.  Will do a chest x-ray to ensure no evidence of pneumonia.  Patient remains hemodynamically stable.  Lactate is mildly elevated at 2.5 and patient had already received a 500 mL bolus and was started on IV fluids.  Also given Tylenol  for her fever.  Patient was covered with Rocephin Flagyl and vancomycin as she does have a prior history of enteric coccus infection.  Concern for possible cellulitis of her abdomen but no frank abscess noted at this time.  Will admit for further care.  Chest x-ray without acute findings.   Doretha Folks, MD 07/29/24 2138

## 2024-07-29 NOTE — Telephone Encounter (Signed)
 FYI Only or Action Required?: FYI only for provider: ED advised.  Patient was last seen in primary care on 07/28/2024 by Ngetich, Roxan BROCKS, NP.  Called Nurse Triage reporting Urine Output.  Symptoms began yesterday.  Interventions attempted: Nothing.  Symptoms are: gradually worsening.  Triage Disposition: Go to ED Now (Notify PCP)  Patient/caregiver understands and will follow disposition?: Yes

## 2024-07-29 NOTE — Progress Notes (Signed)
 Pharmacy Antibiotic Note  Amy Andrade is a 88 y.o. female admitted on 07/29/2024 with severe sepsis likely due to cellulitis. [name]  Pharmacy has been consulted for vancomycin dosing.  Plan: Vancomycin 1250mg  IV q24h (AUC 490.1, Scr 0.9) Follow renal function and clinical course  Height: 5' 10 (177.8 cm) Weight: 79.3 kg (174 lb 13.2 oz) IBW/kg (Calculated) : 68.5  Temp (24hrs), Avg:98.6 F (37 C), Min:97.8 F (36.6 C), Max:100.1 F (37.8 C)  Recent Labs  Lab 07/28/24 1125 07/29/24 1406 07/29/24 2019 07/29/24 2022  WBC 8.8 13.2*  --   --   CREATININE 0.81 1.04* 0.90  --   LATICACIDVEN  --   --   --  2.5*    Estimated Creatinine Clearance: 48.5 mL/min (by C-G formula based on SCr of 0.9 mg/dL).    No Known Allergies  Antimicrobials this admission: 11/12 ceftriaxone >> 11/12 flagyl >> 11/12 vanc >>  Dose adjustments this admission:   Microbiology results: 11/12 BCx:  Thank you for allowing pharmacy to be a part of this patient's care. Leeroy Mace RPh 07/29/2024, 10:15 PM

## 2024-07-29 NOTE — Sepsis Progress Note (Signed)
 Elink monitoring for the code sepsis protocol.

## 2024-07-29 NOTE — Telephone Encounter (Signed)
 Reason for Triage: Amy Andrade (son) is calling for medical advice for his mom.  He states her cognitive has gotten worse by the hour. No bowel movements since Friday. And as of now she also has not urinated either for a long time. He doesn't know if he should call 911 or speak to the PCP or what but he's getting worried for his mom  Reason for Disposition  [1] Unable to urinate (or only a few drops) > 4 hours AND [2] bladder feels very full (e.g., palpable bladder or strong urge to urinate)  Answer Assessment - Initial Assessment Questions Patient's son states that she has not been able to have a bowel movement since Friday. He states that she is having small amounts of pee. States sometimes she gets distracted with ripping the TP off and then doesn't complete peeing but for the last two days he states it has been a trickle. He also states sometimes she says she needs to go 4 times in one hour and does nothing. He states the other day she was on the bedside commode and started screaming that she needed to go to the bathroom and he told her she was there to just go. He states she's not pushing and doesn't know if that's part of the problem. She did also have surgery on 06/30/24 (colectomy). He states that her wound in the middle has popped open and someone prescribed her an atbx for that and he is changing the dressing but he states she is so weak he can barely help her anymore. RN advised son to take pt to the ER for evaluation. He stated he agreed. He was going to call 911 as he didn't think he could physically get her there.    1. SYMPTOM: What's the main symptom you're concerned about? (e.g., frequency, incontinence)     Unable to urinate 2. ONSET: When did the  minimal urination  start?     He states he doesn't think she's had good urine output for 2 days now 3. PAIN: Is there any pain? If Yes, ask: How bad is it? (Scale: 1-10; mild, moderate, severe)     Unknown 4. CAUSE: What do you  think is causing the symptoms?     unknown 5. OTHER SYMPTOMS: Do you have any other symptoms? (e.g., blood in urine, fever, flank pain, pain with urination)     Confusion worse than normal, weakness  Protocols used: Urinary Symptoms-A-AH

## 2024-07-29 NOTE — ED Notes (Signed)
 Pt in bed comfortably, assisted RN with cleaning PT after BM; family member at beside

## 2024-07-29 NOTE — H&P (Signed)
 History and Physical  Amy Andrade FMW:[id number] DOB: 14-Jul-1936 DOA: 07/29/2024  PCP: [name], NP   Chief Complaint: AMS, constipation   HPI: Amy Andrade is a 88 y.o. female with medical history significant for Alzheimer's dementia, hypertension, hyperlipidemia, PE no longer anticoagulation recently hospitalized and underwent colectomy who is now being admitted to the hospital with constipation and severe sepsis likely due to cellulitis.  History is provided mainly by the patient's son who is at the bedside, she was discharged from [facility] to rehab on 10/24 after being admitted for GI bleed and found to have a colon mass which required colon resection.  She recovered well and went home a few days ago to stay with her son and daughter-in-law.  After returning home, they noticed that she had a small open area on her midline abdominal incision, with small amount of gray/white pus expressed.  She did not have any fevers or any other particular concerns.  However over the last 24 hours they noticed that she seemed to be more confused than usual, and was very constipated.  Workup in the emergency department as detailed below shows evidence of severe sepsis and possible associated cellulitis on her midline abdominal wound.  She was also significantly impacted, she has been manually disimpacted and had good bowel movement after an enema in the ER.  Review of Systems: Please see HPI for pertinent positives and negatives. A complete 10 system review of systems are otherwise negative as reviewed with the patient's son at the bedside, as the patient has advanced dementia.  Past Medical History:  Diagnosis Date   Alzheimer disease (HCC)    Asthma    Cancer (HCC)    Hyperlipidemia    Hypertension    Pulmonary embolism (HCC) 2004   was short of breath--was on coumadin for 6 mos to a year   Past Surgical History:  Procedure Laterality Date   ABDOMINAL HYSTERECTOMY     BUNIONECTOMY      CATARACT EXTRACTION Bilateral 06/17/2022   COLONOSCOPY Left 07/01/2024   Procedure: COLONOSCOPY;  Surgeon: Kristie Lamprey, MD;  Location: [facility];  Service: Gastroenterology;  Laterality: Left;  IDA   ESOPHAGOGASTRODUODENOSCOPY Left 07/01/2024   Procedure: EGD (ESOPHAGOGASTRODUODENOSCOPY);  Surgeon: Kristie Lamprey, MD;  Location: [facility];  Service: Gastroenterology;  Laterality: Left;  IDA   LAPAROSCOPIC RIGHT COLECTOMY N/A 07/02/2024   Procedure: COLECTOMY, RIGHT, LAPAROSCOPIC;  Surgeon: Polly Cordella LABOR, MD;  Location: [facility];  Service: General;  Laterality: N/A;   REPLACEMENT TOTAL KNEE  1980s, 2003   x2   Social History:  reports that she quit smoking about 66 years ago. Her smoking use included cigarettes. She started smoking about 70 years ago. She has a 1 pack-year smoking history. She has never used smokeless tobacco. She reports current alcohol use of about 2.0 standard drinks of alcohol per week. She reports that she does not use drugs.  No Known Allergies  Family History  Problem Relation Age of Onset   Alzheimer's disease Mother    Chronic bronchitis Mother    Heart disease Father    High blood pressure Father    High blood pressure Brother    Asthma Daughter    High blood pressure Son    High Cholesterol Son    Post-traumatic stress disorder Son      Prior to Admission medications   Medication Sig Start Date End Date Taking? Authorizing Provider  amLODipine  (NORVASC ) 10 MG tablet Take 1 tablet (10 mg  total) by mouth daily. Patient taking differently: Take 10 mg by mouth in the morning. 06/02/24   Ngetich, Dinah C, NP  aspirin  81 MG tablet Take 81 mg by mouth in the morning.    [provider]  CALCIUM-VITAMIN D PO Take 1 tablet by mouth 2 (two) times daily.    [provider]  doxycycline (VIBRA-TABS) 100 MG tablet Take 1 tablet (100 mg total) by mouth 2 (two) times daily for 10 days. 07/28/24 08/07/24  Ngetich, Dinah C, NP   HYDROcodone -acetaminophen  (NORCO/VICODIN) 5-325 MG tablet Take 1 tablet by mouth every 12 (twelve) hours as needed for severe pain (pain score 7-10). Patient taking differently: Take 1 tablet by mouth as needed for severe pain (pain score 7-10) (shoulder pain keeping her awake). 06/22/24   Ngetich, Dinah C, NP  irbesartan  (AVAPRO ) 300 MG tablet Take 1 tablet (300 mg total) by mouth daily. Patient taking differently: Take 300 mg by mouth in the morning. 06/02/24   Ngetich, Dinah C, NP  methocarbamol  (ROBAXIN ) 500 MG tablet Take 1 tablet by mouth once daily Patient taking differently: Take 500 mg by mouth in the morning. 04/27/24   Ngetich, Dinah C, NP  nitroGLYCERIN  (NITROSTAT ) 0.4 MG SL tablet Place 1 tablet (0.4 mg total) under the tongue every 5 (five) minutes x 3 doses as needed for chest pain. 10/25/20   Medina-Vargas, Monina C, NP  nystatin  cream (MYCOSTATIN ) Apply 1 application  topically 2 (two) times daily. Patient taking differently: Apply 1 application  topically as needed for dry skin. 02/28/22   Ngetich, Dinah C, NP  pravastatin  (PRAVACHOL ) 40 MG tablet Take 1 tablet (40 mg total) by mouth daily. Patient taking differently: Take 40 mg by mouth in the morning. 06/02/24   Ngetich, Roxan BROCKS, NP    Physical Exam: BP (!) 154/71 (BP Location: Right Arm)   Pulse 95   Temp 100.1 F (37.8 C) (Oral)   Resp 19   Ht 5' 10 (1.778 m)   Wt 79.3 kg   SpO2 97%   BMI 25.08 kg/m  General:  Alert, oriented to self and place, calm, in no acute distress.  She looks nontoxic and is following commands.  Her son is at the bedside and assists with the history. Cardiovascular: RRR, no murmurs or rubs, no peripheral edema  Respiratory: clear to auscultation bilaterally, no wheezes, no crackles  Abdomen: soft, nontender, nondistended, normal bowel tones heard  Skin: Midline incision that is mostly well-approximated, with some mild surrounding erythema, tenderness, and gray/white pus a few drops that can be  expressed.  The inferior 1 cm of the wound is slightly open.  No fluctuance is appreciated in the area. Musculoskeletal: no joint effusions, normal range of motion  Psychiatric: appropriate affect, normal speech  Neurologic: extraocular muscles intact, clear speech, moving all extremities with intact sensorium           Labs on Admission:  Basic Metabolic Panel: Recent Labs  Lab 07/28/24 1125 07/29/24 1406 07/29/24 2019  NA 138 133* 131*  K 4.6 4.5 3.9  CL 101 98 99  CO2 25 21*  --   GLUCOSE 110* 112* 104*  BUN 14 16 12   CREATININE 0.81 1.04* 0.90  CALCIUM 9.7 9.9  --    Liver Function Tests: Recent Labs  Lab 07/28/24 1125 07/29/24 1406  AST 16 27  ALT 10 13  ALKPHOS  --  73  BILITOT 0.7 0.7  PROT 6.3 7.4  ALBUMIN  --  4.2  Recent Labs  Lab 07/29/24 1406  LIPASE 26   No results for input(s): AMMONIA in the last 168 hours. CBC: Recent Labs  Lab 07/28/24 1125 07/29/24 1406 07/29/24 2019  WBC 8.8 13.2*  --   NEUTROABS 6,908 11.0*  --   HGB 10.0* 10.7* 11.9*  HCT 32.8* 34.2* 35.0*  MCV 78.7* 78.3*  --   PLT 350 337  --    Cardiac Enzymes: No results for input(s): CKTOTAL, CKMB, CKMBINDEX, TROPONINI in the last 168 hours. BNP (last 3 results) No results for input(s): BNP in the last 8760 hours.  ProBNP (last 3 results) No results for input(s): PROBNP in the last 8760 hours.  CBG: No results for input(s): GLUCAP in the last 168 hours.  Radiological Exams on Admission: DG Chest Port 1 View Result Date: 07/29/2024 EXAM: 1 VIEW XRAY OF THE CHEST 07/29/2024 08:50:00 PM COMPARISON: CT 10 / 16 / 25 CLINICAL HISTORY: fever FINDINGS: LUNGS AND PLEURA: No focal pulmonary opacity. No pleural effusion. No pneumothorax. HEART AND MEDIASTINUM: Cardiomegaly. Calcified aorta. BONES AND SOFT TISSUES: Old left rib fractures. IMPRESSION: 1. No acute cardiopulmonary process identified. Electronically signed by: Norman Gatlin MD 07/29/2024 09:10 PM EST RP  Workstation: [id number]   CT ABDOMEN PELVIS W CONTRAST Result Date: 07/29/2024 EXAM: CT ABDOMEN AND PELVIS WITH CONTRAST 07/29/2024 04:33:59 PM TECHNIQUE: CT of the abdomen and pelvis was performed with the administration of 100 mL of iohexol (OMNIPAQUE) 300 MG/ML solution. Multiplanar reformatted images are provided for review. Automated exposure control, iterative reconstruction, and/or weight-based adjustment of the mA/kV was utilized to reduce the radiation dose to as low as reasonably achievable. COMPARISON: 07/02/2024 CLINICAL HISTORY: Abdominal pain, acute, nonlocalized; increased drainage from abd wound, constipation, component of fecal impaction on exam. FINDINGS: LOWER CHEST: No acute abnormality. LIVER: The liver is unremarkable. GALLBLADDER AND BILE DUCTS: Gallbladder is unremarkable. No biliary ductal dilatation. SPLEEN: No acute abnormality. PANCREAS: No acute abnormality. ADRENAL GLANDS: No acute abnormality. KIDNEYS, URETERS AND BLADDER: No stones in the kidneys or ureters. No hydronephrosis. No perinephric or periureteral stranding. Urinary bladder is unremarkable. GI AND BOWEL: Status post right colectomy. Large amount of stool is seen in the sigmoid colon and rectum concerning for impaction. Stomach demonstrates no acute abnormality. There is no bowel obstruction. PERITONEUM AND RETROPERITONEUM: No ascites. No free air. VASCULATURE: Aorta is normal in caliber. LYMPH NODES: No lymphadenopathy. REPRODUCTIVE ORGANS: Status post hysterectomy. BONES AND SOFT TISSUES: No acute osseous abnormality. No focal soft tissue abnormality. IMPRESSION: 1. Large amount of stool in the sigmoid colon and rectum, concerning for fecal impaction. Electronically signed by: Lynwood Seip MD 07/29/2024 05:19 PM EST RP Workstation: [id number]   Assessment/Plan Amy Andrade is a 88 y.o. female with medical history significant for Alzheimer's dementia, hypertension, hyperlipidemia, PE no longer anticoagulation  recently hospitalized and underwent colectomy who is now being admitted to the hospital with constipation and severe sepsis likely due to cellulitis.  Severe sepsis-meeting criteria with tachycardia, leukocytosis, suspected infection source is cellulitis at her prior abdominal surgical incision site.  Patient is hemodynamically stable, initial lactate 2.5.  She has received IV fluids and broad-spectrum antibiotics. -Inpatient admission to progressive unit -Continue empiric IV Rocephin, IV Flagyl, IV vancomycin -Follow-up blood culture -Continue IV fluid resuscitation, and trend lactic acid  Wound dehiscence with associated cellulitis-empiric IV antibiotics as above -Wound care consult -Secure chat sent to tomorrows on-call surgical team, requesting consultation -Given clinical appearance, suspicion for abscess or deeper infection is low  Hypertension-continue home irbesartan ,  amlodipine   Constipation and stool impaction-patient has been manually disimpacted and had a large bowel movement after enema in the emergency department -Continue stool regimen  Hyperlipidemia-Pravachol   Iron deficiency anemia-in the setting of recent acute blood loss anemia.  Hemoglobin continues to improve.  DVT prophylaxis: Lovenox      Code Status: Full Code  Consults called: Secure chat message sent to general surgery.  Admission status: The appropriate patient status for this patient is INPATIENT. Inpatient status is judged to be reasonable and necessary in order to provide the required intensity of service to ensure the patient's safety. The patient's presenting symptoms, physical exam findings, and initial radiographic and laboratory data in the context of their chronic comorbidities is felt to place them at high risk for further clinical deterioration. Furthermore, it is not anticipated that the patient will be medically stable for discharge from the hospital within 2 midnights of admission.    I certify  that at the point of admission it is my clinical judgment that the patient will require inpatient hospital care spanning beyond 2 midnights from the point of admission due to high intensity of service, high risk for further deterioration and high frequency of surveillance required  Time spent: 59 minutes  Diaz Crago CHRISTELLA Gail MD [facility] Pager [phone]  If 7PM-7AM, please contact night-coverage [email or web address] Password [email or web address]  07/29/2024, 9:45 PM

## 2024-07-29 NOTE — ED Provider Notes (Signed)
 [place] EMERGENCY DEPARTMENT AT [facility] Provider Note   CSN: [id number] Arrival date & time: 07/29/24  1259     Patient presents with: Constipation   Amy Andrade is a 88 y.o. female.    Constipation    Patient presents to the ED with complaints of constipation, inability to have a bowel movement, drainage from her abdominal wound.  Patient has history of Alzheimer's hypertension pulmonary embolism asthma.  She was admitted to the hospital on October 12th.  During that hospitalization patient had a right colectomy for a colon mass.  Patient had outpatient recovery at nursing rehab.  She has been home now for about a week.  Family reports over the last couple of days patient has been having trouble with constipation and inability to have a bowel movement.  She saw the primary care doctor yesterday who recommended prune juice.  Patient has had urge to go to the bathroom but will sit on the commode and then not have a bowel movement.  Family has also noticed increasing drainage from a small wound at her incision site that appeared to open up.  There is also question of whether she has been having some fevers as she has been warm to the touch  Prior to Admission medications   Medication Sig Start Date End Date Taking? Authorizing Provider  amLODipine  (NORVASC ) 10 MG tablet Take 1 tablet (10 mg total) by mouth daily. Patient taking differently: Take 10 mg by mouth in the morning. 06/02/24   Ngetich, Dinah C, NP  aspirin  81 MG tablet Take 81 mg by mouth in the morning.    [provider]  CALCIUM-VITAMIN D PO Take 1 tablet by mouth 2 (two) times daily.    [provider]  doxycycline (VIBRA-TABS) 100 MG tablet Take 1 tablet (100 mg total) by mouth 2 (two) times daily for 10 days. 07/28/24 08/07/24  Ngetich, Dinah C, NP  HYDROcodone -acetaminophen  (NORCO/VICODIN) 5-325 MG tablet Take 1 tablet by mouth every 12 (twelve) hours as needed for severe pain (pain  score 7-10). Patient taking differently: Take 1 tablet by mouth as needed for severe pain (pain score 7-10) (shoulder pain keeping her awake). 06/22/24   Ngetich, Dinah C, NP  irbesartan  (AVAPRO ) 300 MG tablet Take 1 tablet (300 mg total) by mouth daily. Patient taking differently: Take 300 mg by mouth in the morning. 06/02/24   Ngetich, Dinah C, NP  methocarbamol  (ROBAXIN ) 500 MG tablet Take 1 tablet by mouth once daily Patient taking differently: Take 500 mg by mouth in the morning. 04/27/24   Ngetich, Dinah C, NP  nitroGLYCERIN  (NITROSTAT ) 0.4 MG SL tablet Place 1 tablet (0.4 mg total) under the tongue every 5 (five) minutes x 3 doses as needed for chest pain. 10/25/20   Medina-Vargas, Monina C, NP  nystatin  cream (MYCOSTATIN ) Apply 1 application  topically 2 (two) times daily. Patient taking differently: Apply 1 application  topically as needed for dry skin. 02/28/22   Ngetich, Dinah C, NP  pravastatin  (PRAVACHOL ) 40 MG tablet Take 1 tablet (40 mg total) by mouth daily. Patient taking differently: Take 40 mg by mouth in the morning. 06/02/24   Ngetich, Dinah C, NP    Allergies: Patient has no known allergies.    Review of Systems  Gastrointestinal:  Positive for constipation.    Updated Vital Signs BP (!) 191/82 (BP Location: Left Arm)   Pulse (!) 105   Temp 97.8 F (36.6 C) (Oral)   Resp 20  SpO2 99%   Physical Exam Vitals and nursing note reviewed.  Constitutional:      Appearance: She is well-developed. She is ill-appearing. She is not diaphoretic.     Comments: Elderly, frail  HENT:     Head: Normocephalic and atraumatic.     Right Ear: External ear normal.     Left Ear: External ear normal.  Eyes:     General: No scleral icterus.       Right eye: No discharge.        Left eye: No discharge.     Conjunctiva/sclera: Conjunctivae normal.  Neck:     Trachea: No tracheal deviation.  Cardiovascular:     Rate and Rhythm: Normal rate and regular rhythm.  Pulmonary:      Effort: Pulmonary effort is normal. No respiratory distress.     Breath sounds: Normal breath sounds. No stridor. No wheezing or rales.  Abdominal:     General: Bowel sounds are normal. There is no distension.     Palpations: Abdomen is soft.     Tenderness: There is abdominal tenderness. There is no guarding or rebound.     Comments: Small approximately 2 to 3 mm wound dehiscence within her midline surgical scar, small amount of purulent material expressed  Genitourinary:    Comments: Firm claylike consistency stool noted on rectal exam in the rectal vault consistent with fecal impaction Musculoskeletal:        General: No tenderness or deformity.     Cervical back: Neck supple.  Skin:    General: Skin is warm and dry.     Findings: No rash.  Neurological:     General: No focal deficit present.     Mental Status: She is alert.     Cranial Nerves: No cranial nerve deficit, dysarthria or facial asymmetry.     Sensory: No sensory deficit.     Motor: Weakness present. No abnormal muscle tone or seizure activity.     Coordination: Coordination normal.     Comments: Generalized weakness no focal deficits  Psychiatric:        Mood and Affect: Mood normal.     (all labs ordered are listed, but only abnormal results are displayed) Labs Reviewed  COMPREHENSIVE METABOLIC PANEL WITH GFR  LIPASE, BLOOD  CBC WITH DIFFERENTIAL/PLATELET  URINALYSIS, ROUTINE W REFLEX MICROSCOPIC    EKG: None  Radiology: No results found.   Procedures   Medications Ordered in the ED  sodium phosphate (FLEET) enema 1 enema (has no administration in time range)  lactated ringers bolus 500 mL (has no administration in time range)    Clinical Course as of 07/29/24 1702  Wed Jul 29, 2024  1514 Urinalysis, Routine w reflex microscopic -Urine, Catheterized(!) Urinalysis without signs of infection [JK]  1514 CBC with Diff(!) Leukocytosis noted [JK]    Clinical Course User Index [JK] Randol Simmonds, MD                                  Medical Decision Making Amount and/or Complexity of Data Reviewed Labs: ordered. Decision-making details documented in ED Course. Radiology: ordered.  Risk OTC drugs. Prescription drug management.   Patient presented ED with complaints of abdominal discomfort decreased bowel movement.  Patient had recent colon surgery concerned about the possibility of infection colonic obstruction constipation and fecal impaction.  On exam patient did have a large amount of stool in the rectal vault.  Attempted to break up the stool during digital rectal exam.  Enema was ordered.  Laboratory test notable for slight leukocytosis.  No significant electrolyte abnormalities.  CT scan was ordered to evaluate for obstruction or other complications associated with recent colon surgery.  CT scan pending.  Care turned over to Dr. Doretha.  Patient may require repeat enema as she only had a small bowel movement with her initial enema     Final diagnoses:  None    ED Discharge Orders     None          Randol Simmonds, MD 07/29/24 [phone]

## 2024-07-29 NOTE — ED Notes (Signed)
 Pt placed on bedside commode, RN in room with pt.

## 2024-07-30 DIAGNOSIS — R6520 Severe sepsis without septic shock: Secondary | ICD-10-CM | POA: Diagnosis not present

## 2024-07-30 DIAGNOSIS — A419 Sepsis, unspecified organism: Secondary | ICD-10-CM | POA: Diagnosis not present

## 2024-07-30 DIAGNOSIS — C189 Malignant neoplasm of colon, unspecified: Secondary | ICD-10-CM | POA: Diagnosis not present

## 2024-07-30 LAB — BASIC METABOLIC PANEL WITH GFR
Anion gap: 14 (ref 5–15)
BUN: 11 mg/dL (ref 8–23)
CO2: 22 mmol/L (ref 22–32)
Calcium: 9.3 mg/dL (ref 8.9–10.3)
Chloride: 98 mmol/L (ref 98–111)
Creatinine, Ser: 0.85 mg/dL (ref 0.44–1.00)
GFR, Estimated: >60 mL/min (ref >60)
Glucose, Bld: 88 mg/dL (ref 70–99)
Potassium: 3.7 mmol/L (ref 3.5–5.1)
Sodium: 134 mmol/L — ABNORMAL LOW (ref 135–145)

## 2024-07-30 LAB — CBC
HCT: 32.2 % — ABNORMAL LOW (ref 36.0–46.0)
Hemoglobin: 10.4 g/dL — ABNORMAL LOW (ref 12.0–15.0)
MCH: 24.8 pg — ABNORMAL LOW (ref 26.0–34.0)
MCHC: 32.3 g/dL (ref 30.0–36.0)
MCV: 76.8 fL — ABNORMAL LOW (ref 80.0–100.0)
Platelets: 231 K/uL (ref 150–400)
RBC: 4.19 MIL/uL (ref 3.87–5.11)
RDW: 26.2 % — ABNORMAL HIGH (ref 11.5–15.5)
WBC: 12.1 K/uL — ABNORMAL HIGH (ref 4.0–10.5)
nRBC: 0 % (ref 0.0–0.2)

## 2024-07-30 LAB — URINE CULTURE: Culture: NO GROWTH

## 2024-07-30 MED ORDER — ORAL CARE MOUTH RINSE
15.0000 mL | OROMUCOSAL | Status: DC | PRN
Start: 2024-07-30 — End: 2024-08-04

## 2024-07-30 MED ORDER — VANCOMYCIN HCL 1250 MG/250ML IV SOLN
1250.0000 mg | INTRAVENOUS | Status: DC
  Administered 2024-07-30 – 2024-07-31 (×2): 1250 mg via INTRAVENOUS
  Filled 2024-07-30 (×2): qty 250

## 2024-07-30 MED ORDER — HALOPERIDOL LACTATE 5 MG/ML IJ SOLN
1.0000 mg | Freq: Four times a day (QID) | INTRAMUSCULAR | Status: DC | PRN
  Administered 2024-08-01: 1 mg via INTRAVENOUS
  Filled 2024-07-30: qty 1

## 2024-07-30 NOTE — Progress Notes (Incomplete Revision)
 ADDENDUM:  Patient was personally and independently interviewed, examined and relevant elements of the history of present illness were reviewed in details and an assessment and plan was created. All elements of the patient's history of present illness, assessment and plan were discussed in detail with Amy JINNY Brunner, NP***. The above documentation reflects our combined findings assessment and plan.   Briefly, 88 year old lady who was recently admitted in October 2025 with generalized weakness and fatigue and was found to have symptomatic anemia.  Hemoglobin was decreased at 7.2.  Found to have iron deficiency and vitamin B12 deficiency.   She had GI workup with EGD and colonoscopy on 07/01/2024.  EGD showed evidence of large hiatal hernia with a couple of ?Cameron lesions.  No other abnormalities.  On colonoscopy, malignant appearing 5 cm infiltrative mass in the proximal ascending colon was noted and was biopsied.  Pathology came back positive for moderately differentiated adenocarcinoma.  MMR intact.   On 07/02/2024, staging CT chest, abdomen and pelvis showed fluid in the vaginal apex, new from recent prior.  Consider GYN evaluation.  Otherwise unremarkable CT scan.   On 07/02/2024, she underwent right hemicolectomy.  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  Margins were negative for carcinoma and dysplasia.  No LVI or PNI.  12 lymph nodes were removed and were negative for malignancy.  Grade 3. pT3,pN0,cM0, Stage II A disease.    CEA was within normal limits at 2.9 preoperatively.   Patient does have Alzheimer's dementia.   Given stage II A disease, without high risk features, we can continue surveillance alone as per NCCN guidelines, especially considering her medical comorbidities, functional status and dementia issues.    *** I will plan to see her in clinic for follow-up and continuation of  surveillance.   Rest of the management is as per primary team.  Transfuse as needed to maintain hemoglobin above 7.  Continue iron and B12 supplementation.   Please call us  with any questions.  Amy Andrade   DOB:Sep 28, 1935   FM#:[id number]      ASSESSMENT & PLAN:  Amy Andrade. Amy Andrade is an 88 year old female patient admitted on 07/29/2024 with complaints of altered mental status and constipation.  Oncologic history significant for colon cancer.  Medical oncology following.  Colon adenocarcinoma - Patient had GI workup with EGD and colonoscopy.  Biopsy done 07/01/2024 shows moderately differentiated adenocarcinoma. - Status post right hemicolectomy. - Previous tumor markers CEA was normal - Due to stage IIa disease, previously recommended surveillance alone per NCCN guidelines considering multiple comorbidities, dementia and functional status. - Medical oncology/Dr. Atasha Colebank following and will discuss in more details with patient's son.  Anemia Iron deficiency anemia B12 deficiency - Hemoglobin stable 10.4 - No transfusional intervention required at this time - Status post IV iron repletion  - Continue to monitor CBC with differential  Alzheimer's dementia - Family states that patient's mentation is worsening - Noted confusion at baseline and documentation - Continue supportive care    Code Status DNR-Limited  Subjective:  Patient seen awake and alert sitting up in bed with one-to-one sitter assisting patient with lunch.  Nursing staff state that patient was trying to get out of bed and is unable to ambulate by herself.  Patient is oriented to self only, she is not oriented to time or place.  Denies pain or acute GI symptoms.  No acute distress is noted.   Objective:   Intake/Output Summary (Last 24  hours) at 07/30/2024 1030 Last data filed at 07/30/2024 0945 Gross per 24 hour  Intake 1584.47 ml  Output --  Net 1584.47 ml     PHYSICAL EXAMINATION: ECOG PERFORMANCE  STATUS: 4 - Bedbound  Vitals:   07/30/24 0141 07/30/24 0924  BP: (!) 140/58 (!) 143/69  Pulse: 76 82  Resp: 18   Temp: 98.7 F (37.1 C)   SpO2: 100%    Filed Weights   07/29/24 1611 07/30/24 0141  Weight: 174 lb 13.2 oz (79.3 kg) 167 lb 12.3 oz (76.1 kg)    GENERAL: alert, no distress and comfortable SKIN: skin color, texture, turgor are normal, no rashes or significant lesions EYES: normal, conjunctiva are pink and non-injected, sclera clear OROPHARYNX: no exudate, no erythema and lips, buccal mucosa, and tongue normal  NECK: supple, thyroid  normal size, non-tender, without nodularity LYMPH: no palpable lymphadenopathy in the cervical, axillary or inguinal LUNGS: clear to auscultation and percussion with normal breathing effort HEART: regular rate & rhythm and no murmurs and no lower extremity edema ABDOMEN: abdomen soft, non-tender and normal bowel sounds MUSCULOSKELETAL: no cyanosis of digits and no clubbing  PSYCH: + Altered mental status NEURO: no focal motor/sensory deficits   All questions were answered. The patient knows to call the clinic with any problems, questions or concerns.   The total time spent in the appointment was 40 minutes encounter with patient including review of chart and various tests results, discussions about plan of care and coordination of care plan  Amy JINNY Brunner, NP 07/30/2024 10:30 AM    Labs Reviewed:  Lab Results  Component Value Date   WBC 12.1 (H) 07/30/2024   HGB 10.4 (L) 07/30/2024   HCT 32.2 (L) 07/30/2024   MCV 76.8 (L) 07/30/2024   PLT 231 07/30/2024   Recent Labs    06/28/24 1352 06/29/24 0524 07/08/24 0052 07/28/24 1125 07/29/24 1406 07/29/24 2019 07/30/24 0607  NA 131*   < > 130* 138 133* 131* 134*  K 4.4   < > 4.7 4.6 4.5 3.9 3.7  CL 97*   < > 100 101 98 99 98  CO2 23   < > 24 25 21*  --  22  GLUCOSE 108*   < > 137* 110* 112* 104* 88  BUN 23   < > 9 14 16 12 11   CREATININE 1.09*   < > 0.81 0.81 1.04* 0.90 0.85   CALCIUM 9.6   < > 9.0 9.7 9.9  --  9.3  GFRNONAA 49*   < > >60  --  52*  --  >60  PROT 6.7  --   --  6.3 7.4  --   --   ALBUMIN 4.1  --   --   --  4.2  --   --   AST 18  --   --  16 27  --   --   ALT 10  --   --  10 13  --   --   ALKPHOS 68  --   --   --  73  --   --   BILITOT 0.6  --   --  0.7 0.7  --   --    < > = values in this interval not displayed.    Studies Reviewed:  DG Chest Port 1 View Result Date: 07/29/2024 EXAM: 1 VIEW XRAY OF THE CHEST 07/29/2024 08:50:00 PM COMPARISON: CT 10 / 16 / 25 CLINICAL HISTORY: fever FINDINGS: LUNGS AND PLEURA:  No focal pulmonary opacity. No pleural effusion. No pneumothorax. HEART AND MEDIASTINUM: Cardiomegaly. Calcified aorta. BONES AND SOFT TISSUES: Old left rib fractures. IMPRESSION: 1. No acute cardiopulmonary process identified. Electronically signed by: Norman Gatlin MD 07/29/2024 09:10 PM EST RP Workstation: [id number]   CT ABDOMEN PELVIS W CONTRAST Result Date: 07/29/2024 EXAM: CT ABDOMEN AND PELVIS WITH CONTRAST 07/29/2024 04:33:59 PM TECHNIQUE: CT of the abdomen and pelvis was performed with the administration of 100 mL of iohexol (OMNIPAQUE) 300 MG/ML solution. Multiplanar reformatted images are provided for review. Automated exposure control, iterative reconstruction, and/or weight-based adjustment of the mA/kV was utilized to reduce the radiation dose to as low as reasonably achievable. COMPARISON: 07/02/2024 CLINICAL HISTORY: Abdominal pain, acute, nonlocalized; increased drainage from abd wound, constipation, component of fecal impaction on exam. FINDINGS: LOWER CHEST: No acute abnormality. LIVER: The liver is unremarkable. GALLBLADDER AND BILE DUCTS: Gallbladder is unremarkable. No biliary ductal dilatation. SPLEEN: No acute abnormality. PANCREAS: No acute abnormality. ADRENAL GLANDS: No acute abnormality. KIDNEYS, URETERS AND BLADDER: No stones in the kidneys or ureters. No hydronephrosis. No perinephric or periureteral stranding.  Urinary bladder is unremarkable. GI AND BOWEL: Status post right colectomy. Large amount of stool is seen in the sigmoid colon and rectum concerning for impaction. Stomach demonstrates no acute abnormality. There is no bowel obstruction. PERITONEUM AND RETROPERITONEUM: No ascites. No free air. VASCULATURE: Aorta is normal in caliber. LYMPH NODES: No lymphadenopathy. REPRODUCTIVE ORGANS: Status post hysterectomy. BONES AND SOFT TISSUES: No acute osseous abnormality. No focal soft tissue abnormality. IMPRESSION: 1. Large amount of stool in the sigmoid colon and rectum, concerning for fecal impaction. Electronically signed by: Lynwood Seip MD 07/29/2024 05:19 PM EST RP Workstation: [id number]   CT CHEST ABDOMEN PELVIS W CONTRAST Result Date: 07/02/2024 EXAM: CT CHEST, ABDOMEN AND PELVIS WITH CONTRAST 07/02/2024 11:22:16 AM TECHNIQUE: CT of the chest, abdomen and pelvis was performed with the administration of 100 mL (iohexol (OMNIPAQUE) 300 MG/ML solution 100 mL IOHEXOL 300 MG/ML SOLN) intravenous contrast. Multiplanar reformatted images are provided for review. Automated exposure control, iterative reconstruction, and/or weight based adjustment of the mA/kV was utilized to reduce the radiation dose to as low as reasonably achievable. COMPARISON: 06/28/2024 CLINICAL HISTORY: Metastatic disease evaluation. Per Dr.Pahwani notes:; Brief Narrative: ; KIP KAUTZMAN is a 88 y.o. female with medical history significant for Alzheimer's dementia, HTN, HLD, chronic pain due to OA of both shoulders and chronic left shoulder dislocation who presented to the ED for evaluation of generalized weakness and ; no other complaint. She notes frequent urinary urgency. She was recently treated for UTI initially with ciprofloxacin . Urine culture grew Enterococcus faecalis and antibiotics were switched to nitrofurantoin  which she has since completed FINDINGS: CHEST: MEDIASTINUM AND LYMPH NODES: Heart and pericardium are unremarkable.  The central airways are clear. No mediastinal, hilar or axillary lymphadenopathy. Mild thoracic aortic atherosclerosis. Mild coronary atherosclerosis of the LAD and right coronary artery. LUNGS AND PLEURA: No focal consolidation or pulmonary edema. No pleural effusion or pneumothorax. Mild scarring in the bilateral lower lobes. ABDOMEN AND PELVIS: LIVER: The liver is unremarkable. GALLBLADDER AND BILE DUCTS: Gallbladder is unremarkable. No biliary ductal dilatation. SPLEEN: No acute abnormality. PANCREAS: No acute abnormality. ADRENAL GLANDS: No acute abnormality. KIDNEYS, URETERS AND BLADDER: No stones in the kidneys or ureters. No hydronephrosis. No perinephric or periureteral stranding. Urinary bladder is unremarkable. GI AND BOWEL: Stomach demonstrates no acute abnormality. There is no bowel obstruction. REPRODUCTIVE ORGANS: Status post hysterectomy with new fluid within the vaginal apex (image 101).  PERITONEUM AND RETROPERITONEUM: No ascites. No free air. VASCULATURE: Aorta is normal in caliber. Atherosclerotic calcifications of the abdominal aorta and branch vessels, although patent. ABDOMINAL AND PELVIS LYMPH NODES: No lymphadenopathy. BONES AND SOFT TISSUES: Severe degenerative changes of the right shoulder. Mild degenerative changes of the left shoulder with anterior subluxation. Degenerative changes of the visualized thoracolumbar spine. Status post L4-L5 PLIF. S-shaped thoracolumbar scoliosis. No acute osseous abnormality. No focal soft tissue abnormality. IMPRESSION: 1. Fluid in the vaginal apex, new from recent prior. Consider GYN evaluation. 2. Otherwise unchanged from recent CT. Chronic ancillary findings as above. 3. No findings suspicious for primary malignancy or metastatic disease. Electronically signed by: Pinkie Pebbles MD 07/02/2024 08:23 PM EDT RP Workstation: [id number]

## 2024-07-30 NOTE — Progress Notes (Signed)
 PROGRESS NOTE    Amy Andrade  FMW:[id number] DOB: 01-30-1936 DOA: 07/29/2024 PCP: [name], NP   Brief Narrative:  88 y.o. 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  She presented with increased confusion and discharged from abdominal incision.  On presentation, she was found to have severe sepsis possibly associated with cellulitis of her midline abdominal wound along with stool impaction requiring manual disimpaction in the ED and an enema.  She was started on IV fluids and antibiotics.  Assessment & Plan:   Severe sepsis: Present on admission Wound dehiscence with associated cellulitis - Presented with tachycardia, leukocytosis, lactic acidosis with possible cellulitis around her abdominal surgical site -Continue broad-spectrum antibiotics.  Continue IV fluids.  Follow cultures.  Follow general surgery recommendations regarding wound care.  New diagnosis of adenocarcinoma of colon - Had colon resection during recent hospitalization last month.  Will require oncology evaluation as an outpatient  Constipation and stool impaction -Needed manual disimpaction and enema treatment in the ED resulting in subsequent large bowel movement.  Continue bowel regimen  Hypertension -Continue amlodipine  and irbesartan .  Blood pressure stable  Hyperlipidemia -Continue statin  Iron deficiency anemia -In the setting of recent acute blood loss anemia.  Hemoglobin stable.  Monitor intermittently  Leukocytosis -Improving.  Monitor  Hyponatremia - Mild.  Improving.  Monitor  Acute metabolic acidosis -Improved  Lactic acidosis -Improved  Goals  of care -Overall prognosis is guarded to poor.   - Son at bedside is agreeable to change her CODE STATUS to DNR.  If condition does not improve, he is leaning towards hospice and would appreciate conversation with palliative care team.  Consult palliative care.  Dementia Delirium -Monitor mental status.  Fall precaution.  Delirium precautions.   DVT prophylaxis: Lovenox  Code Status: Full Family Communication: Son at bedside Disposition Plan: Status is: Inpatient Remains inpatient appropriate because: Of severity of illness    Consultants: General surgery  Procedures: None  Antimicrobials: Rocephin, vancomycin and Flagyl from 07/29/2024 onwards   Subjective: Patient seen and examined at bedside.  No fever, vomiting, seizures reported.  Objective: Vitals:   07/29/24 1921 07/29/24 2258 07/30/24 0000 07/30/24 0141  BP: (!) 154/71 (!) 124/55  (!) 140/58  Pulse: 95 87  76  Resp: 19 18 19 18   Temp: 100.1 F (37.8 C) 100.3 F (37.9 C)  98.7 F (37.1 C)  TempSrc: Oral Oral  Oral  SpO2: 97% 96%  100%  Weight:    76.1 kg  Height:    5' 10 (1.778 m)    Intake/Output Summary (Last 24 hours) at 07/30/2024 0656 Last data filed at 07/30/2024 9687 Gross per 24 hour  Intake 900.07 ml  Output --  Net 900.07 ml   Filed Weights   07/29/24 1611 07/30/24 0141  Weight: 79.3 kg 76.1 kg    Examination:  General exam: Appears calm and comfortable.  Elderly female lying in bed. Respiratory system: Bilateral decreased breath sounds at bases Cardiovascular system: S1 & S2 heard, Rate controlled Gastrointestinal system: Abdomen is nondistended, soft and nontender. Normal bowel sounds heard. Extremities: No cyanosis, clubbing, edema  Central nervous system: Awake.  Slow to respond.  Poor historian.  Confused.  No focal neurological deficits. Moving extremities Skin: Abdominal wall  dressing present  psychiatry: Flat affect.  Not agitated.    Data Reviewed: I have personally  reviewed following labs and imaging studies  CBC: Recent Labs  Lab 07/28/24 1125 07/29/24 1406 07/29/24 2019 07/30/24 0607  WBC 8.8 13.2*  --  12.1*  NEUTROABS 6,908 11.0*  --   --   HGB 10.0* 10.7* 11.9* 10.4*  HCT 32.8* 34.2* 35.0* 32.2*  MCV 78.7* 78.3*  --  76.8*  PLT 350 337  --  231   Basic Metabolic Panel: Recent Labs  Lab 07/28/24 1125 07/29/24 1406 07/29/24 2019 07/30/24 0607  NA 138 133* 131* 134*  K 4.6 4.5 3.9 3.7  CL 101 98 99 98  CO2 25 21*  --  22  GLUCOSE 110* 112* 104* 88  BUN 14 16 12 11   CREATININE 0.81 1.04* 0.90 0.85  CALCIUM 9.7 9.9  --  9.3   GFR: Estimated Creatinine Clearance: 51.4 mL/min (by C-G formula based on SCr of 0.85 mg/dL). Liver Function Tests: Recent Labs  Lab 07/28/24 1125 07/29/24 1406  AST 16 27  ALT 10 13  ALKPHOS  --  73  BILITOT 0.7 0.7  PROT 6.3 7.4  ALBUMIN  --  4.2   Recent Labs  Lab 07/29/24 1406  LIPASE 26   No results for input(s): AMMONIA in the last 168 hours. Coagulation Profile: No results for input(s): INR, PROTIME in the last 168 hours. Cardiac Enzymes: No results for input(s): CKTOTAL, CKMB, CKMBINDEX, TROPONINI in the last 168 hours. BNP (last 3 results) No results for input(s): PROBNP in the last 8760 hours. HbA1C: No results for input(s): HGBA1C in the last 72 hours. CBG: No results for input(s): GLUCAP in the last 168 hours. Lipid Profile: No results for input(s): CHOL, HDL, LDLCALC, TRIG, CHOLHDL, LDLDIRECT in the last 72 hours. Thyroid  Function Tests: No results for input(s): TSH, T4TOTAL, FREET4, T3FREE, THYROIDAB in the last 72 hours. Anemia Panel: No results for input(s): VITAMINB12, FOLATE, FERRITIN, TIBC, IRON, RETICCTPCT in the last 72 hours. Sepsis Labs: Recent Labs  Lab 07/29/24 2022 07/29/24 2223  LATICACIDVEN 2.5* 0.7    No results found for this or any previous visit (from the past 240 hours).       Radiology  Studies: DG Chest Port 1 View Result Date: 07/29/2024 EXAM: 1 VIEW XRAY OF THE CHEST 07/29/2024 08:50:00 PM COMPARISON: CT 10 / 16 / 25 CLINICAL HISTORY: fever FINDINGS: LUNGS AND PLEURA: No focal pulmonary opacity. No pleural effusion. No pneumothorax. HEART AND MEDIASTINUM: Cardiomegaly. Calcified aorta. BONES AND SOFT TISSUES: Old left rib fractures. IMPRESSION: 1. No acute cardiopulmonary process identified. Electronically signed by: Norman Gatlin MD 07/29/2024 09:10 PM EST RP Workstation: [id number]   CT ABDOMEN PELVIS W CONTRAST Result Date: 07/29/2024 EXAM: CT ABDOMEN AND PELVIS WITH CONTRAST 07/29/2024 04:33:59 PM TECHNIQUE: CT of the abdomen and pelvis was performed with the administration of 100 mL of iohexol (OMNIPAQUE) 300 MG/ML solution. Multiplanar reformatted images are provided for review. Automated exposure control, iterative reconstruction, and/or weight-based adjustment of the mA/kV was utilized to reduce the radiation dose to as low as reasonably achievable. COMPARISON: 07/02/2024 CLINICAL HISTORY: Abdominal pain, acute, nonlocalized; increased drainage from abd wound, constipation, component of fecal impaction on exam. FINDINGS: LOWER CHEST: No acute abnormality. LIVER: The liver is unremarkable. GALLBLADDER AND BILE DUCTS: Gallbladder is unremarkable. No biliary ductal dilatation. SPLEEN: No acute abnormality. PANCREAS: No acute abnormality. ADRENAL GLANDS: No acute abnormality. KIDNEYS, URETERS AND BLADDER: No stones in the kidneys or ureters.  No hydronephrosis. No perinephric or periureteral stranding. Urinary bladder is unremarkable. GI AND BOWEL: Status post right colectomy. Large amount of stool is seen in the sigmoid colon and rectum concerning for impaction. Stomach demonstrates no acute abnormality. There is no bowel obstruction. PERITONEUM AND RETROPERITONEUM: No ascites. No free air. VASCULATURE: Aorta is normal in caliber. LYMPH NODES: No lymphadenopathy. REPRODUCTIVE ORGANS:  Status post hysterectomy. BONES AND SOFT TISSUES: No acute osseous abnormality. No focal soft tissue abnormality. IMPRESSION: 1. Large amount of stool in the sigmoid colon and rectum, concerning for fecal impaction. Electronically signed by: Lynwood Seip MD 07/29/2024 05:19 PM EST RP Workstation: [id number]        Scheduled Meds:  amLODipine   10 mg Oral Daily   aspirin  EC  81 mg Oral Daily   docusate sodium  100 mg Oral BID   enoxaparin  (LOVENOX ) injection  40 mg Subcutaneous Q24H   irbesartan   300 mg Oral Daily   pravastatin   40 mg Oral Daily   Continuous Infusions:  cefTRIAXone (ROCEPHIN)  IV     lactated ringers 100 mL/hr at 07/30/24 9687   metronidazole     vancomycin            Sophie Mao, MD [facility] 07/30/2024, 6:56 AM

## 2024-07-30 NOTE — Plan of Care (Signed)
  Problem: Clinical Measurements: Goal: Respiratory complications will improve Outcome: Progressing Goal: Cardiovascular complication will be avoided Outcome: Progressing   Problem: Coping: Goal: Level of anxiety will decrease Outcome: Progressing   Problem: Pain Managment: Goal: General experience of comfort will improve and/or be controlled Outcome: Progressing   Problem: Safety: Goal: Ability to remain free from injury will improve Outcome: Progressing   Problem: Skin Integrity: Goal: Risk for impaired skin integrity will decrease Outcome: Progressing

## 2024-07-30 NOTE — Progress Notes (Addendum)
 Patient is constantly trying to get OOB and is a very high fall risk. We got her up once during the night but needed 3-4 assist. I strongly recommend a safety sitter or family to be with patient at all time for safety. Notified Dr. Cheryle and patient's son, [name].

## 2024-07-30 NOTE — TOC Initial Note (Signed)
 Transition of Care [facility]) - Initial/Assessment Note   Patient Details  Name: Amy Andrade MRN: [id number] Date of Birth: [birth year]  Transition of Care [facility]) CM/SW Contact:    Duwaine GORMAN Aran, LCSW Phone Number: 07/30/2024, 8:48 AM  Clinical Narrative: Patient is from home with family and is currently oriented to self only. Patient recently discharged to [facility] [place] for rehab and has since returned home. Patient is on IV antibiotics. Care management following for possible discharge needs.  Expected Discharge Plan: Home/Self Care Barriers to Discharge: Continued Medical Work up  Expected Discharge Plan and Services In-house Referral: Clinical Social Work Living arrangements for the past 2 months: Single Family Home  Prior Living Arrangements/Services Living arrangements for the past 2 months: Single Family Home Lives with:: Adult Children Patient language and need for interpreter reviewed:: Yes Do you feel safe going back to the place where you live?: Yes      Need for Family Participation in Patient Care: Yes (Comment) (Oriented to self only.) Care giver support system in place?: Yes (comment) Criminal Activity/Legal Involvement Pertinent to Current Situation/Hospitalization: No - Comment as needed  Activities of Daily Living ADL Screening (condition at time of admission) Independently performs ADLs?: No Does the patient have a NEW difficulty with bathing/dressing/toileting/self-feeding that is expected to last >3 days?: No Does the patient have a NEW difficulty with getting in/out of bed, walking, or climbing stairs that is expected to last >3 days?: No Does the patient have a NEW difficulty with communication that is expected to last >3 days?: No Is the patient deaf or have difficulty hearing?: Yes Does the patient have difficulty seeing, even when wearing glasses/contacts?: No Does the patient have difficulty concentrating, remembering, or making decisions?: Yes  Emotional  Assessment Attitude/Demeanor/Rapport: Unable to Assess Affect (typically observed): Unable to Assess Orientation: : Oriented to Self Alcohol / Substance Use: Not Applicable Psych Involvement: No (comment)  Admission diagnosis:  Fecal impaction (HCC) [K56.41] Severe sepsis (HCC) [A41.9, R65.20] Sepsis without acute organ dysfunction, due to unspecified organism [facility]) [A41.9] Patient Active Problem List   Diagnosis Date Noted   Severe sepsis (HCC) 07/29/2024   Anemia due to chronic blood loss 07/28/2024   Pernicious anemia 07/28/2024   Iron deficiency anemia, unspecified 07/10/2024   Anemia in neoplastic disease 07/10/2024   Hypo-osmolality and hyponatremia 07/10/2024   Abnormal gait 07/10/2024   Gastrointestinal hemorrhage 07/10/2024   Muscle weakness 07/10/2024   Postoperative care for cataract 07/10/2024   Primary malignant neoplasm of colon (HCC) 07/10/2024   Pulmonary embolism (HCC) 07/10/2024   Alzheimer's disease (HCC) 07/10/2024   Anemia 07/10/2024   Uncomplicated asthma 07/10/2024   Adenocarcinoma of colon (HCC) 07/08/2024   Symptomatic anemia 06/28/2024   Chronic right shoulder pain 06/09/2024   S/P TKR (total knee replacement) using cement, left 09/22/2020   Leukopenia 09/08/2020   Unspecified protein-calorie malnutrition 09/08/2020   Neurocognitive deficits 09/08/2020   Femur fracture, left (HCC) 09/01/2020   Closed fracture of left distal femur (HCC) 08/31/2020   Coronavirus infection 08/31/2020   Chronic left shoulder pain 04/25/2020   Chronic bilateral low back pain without sciatica 04/25/2020   Uncomplicated alcohol dependence (HCC) 04/25/2020   Chronic hyponatremia 05/31/2016   Incidental lung nodule 05/30/2016   History of pulmonary embolism 05/30/2016   Alzheimer disease (HCC)    Asthma    Hyperlipidemia    Hypertension    Skin lesion of chest wall 06/22/2011   PCP:  Ngetich, Roxan BROCKS, NP Pharmacy:   [facility]  [address] [place], [place]  - [address] [place] [address] Phone: [phone] Fax: [phone]  [id number] - [place], [place] - [address] AT [facility] [address] [place] [address] [place] [address] Phone: [phone] Fax: [phone]  Social Drivers of Health (SDOH) Social History: SDOH Screenings   Food Insecurity: No Food Insecurity (07/30/2024)  Housing: Low Risk  (07/30/2024)  Transportation Needs: No Transportation Needs (07/30/2024)  Utilities: Not At Risk (07/30/2024)  Depression (PHQ2-9): Low Risk  (07/28/2024)  Financial Resource Strain: Low Risk  (11/18/2017)  Physical Activity: Inactive (11/18/2017)  Social Connections: Socially Isolated (07/30/2024)  Stress: No Stress Concern Present (11/18/2017)  Tobacco Use: Medium Risk (07/29/2024)   SDOH Interventions:    Readmission Risk Interventions    07/30/2024    8:43 AM 07/10/2024   10:46 AM  Readmission Risk Prevention Plan  Transportation Screening Complete Complete  PCP or Specialist Appt within 5-7 Days  Complete  Home Care Screening  Complete  Medication Review (RN CM)  Complete  HRI or Home Care Consult Complete   Social Work Consult for Recovery Care Planning/Counseling Complete   Palliative Care Screening Not Applicable   Medication Review [employer]) Complete

## 2024-07-30 NOTE — Progress Notes (Addendum)
 ADDENDUM:  Patient was personally and independently interviewed, examined and relevant elements of the history of present illness were reviewed in details and an assessment and plan was created. All elements of the patient's history of present illness, assessment and plan were discussed in detail with Olam JINNY Brunner, NP. The above documentation reflects our combined findings assessment and plan.   Briefly, 88 year old lady who was recently admitted in October 2025 with generalized weakness and fatigue and was found to have symptomatic anemia.  Hemoglobin was decreased at 7.2.  Found to have iron deficiency and vitamin B12 deficiency.   She had GI workup with EGD and colonoscopy on 07/01/2024.  EGD showed evidence of large hiatal hernia with a couple of ?Cameron lesions.  No other abnormalities.  On colonoscopy, malignant appearing 5 cm infiltrative mass in the proximal ascending colon was noted and was biopsied.  Pathology came back positive for moderately differentiated adenocarcinoma.  MMR intact.   On 07/02/2024, staging CT chest, abdomen and pelvis showed fluid in the vaginal apex, new from recent prior.  Consider GYN evaluation.  Otherwise unremarkable CT scan.   On 07/02/2024, she underwent right hemicolectomy.  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  Margins were negative for carcinoma and dysplasia.  No LVI or PNI.  12 lymph nodes were removed and were negative for malignancy.  Grade 3. pT3,pN0,cM0, Stage II A disease.    CEA was within normal limits at 2.9 preoperatively.   Patient does have Alzheimer's dementia.   Given stage II A disease, without high risk features, we can continue surveillance alone as per NCCN guidelines, especially considering her medical comorbidities, functional status and dementia issues.    Plan of care was discussed with the patient's son over the phone and he verbalized  understanding.  He did not have any further questions.  Depending on her clinical recovery from current hospitalization, I will plan to see her in clinic for follow-up and continuation of surveillance.   Rest of the management is as per primary team.  Transfuse as needed to maintain hemoglobin above 7.  Continue iron and B12 supplementation.   Please call us  with any questions.  Kelsea Mousel Rengel   DOB:1936/06/17   FM#:[id number]      ASSESSMENT & PLAN:  Iantha Titsworth. Shiel is an 88 year old female patient admitted on 07/29/2024 with complaints of altered mental status and constipation.  Oncologic history significant for colon cancer.  Medical oncology following.  Colon adenocarcinoma - Patient had GI workup with EGD and colonoscopy.  Biopsy done 07/01/2024 shows moderately differentiated adenocarcinoma. - Status post right hemicolectomy. - Previous tumor markers CEA was normal - Due to stage IIa disease, previously recommended surveillance alone per NCCN guidelines considering multiple comorbidities, dementia and functional status. - Medical oncology/Dr. Jamill Wetmore following and will discuss in more details with patient's son.  Anemia Iron deficiency anemia B12 deficiency - Hemoglobin stable 10.4 - No transfusional intervention required at this time - Status post IV iron repletion  - Continue to monitor CBC with differential  Alzheimer's dementia - Family states that patient's mentation is worsening - Noted confusion at baseline and documentation - Continue supportive care    Code Status DNR-Limited  Subjective:  Patient seen awake and alert sitting up in bed with one-to-one sitter assisting patient with lunch.  Nursing staff state that patient was trying to get out of bed and is unable to ambulate by herself.  Patient is oriented to  self only, she is not oriented to time or place.  Denies pain or acute GI symptoms.  No acute distress is noted.   Objective:   Intake/Output Summary  (Last 24 hours) at 07/30/2024 1030 Last data filed at 07/30/2024 0945 Gross per 24 hour  Intake 1584.47 ml  Output --  Net 1584.47 ml     PHYSICAL EXAMINATION: ECOG PERFORMANCE STATUS: 4 - Bedbound  Vitals:   07/30/24 0141 07/30/24 0924  BP: (!) 140/58 (!) 143/69  Pulse: 76 82  Resp: 18   Temp: 98.7 F (37.1 C)   SpO2: 100%    Filed Weights   07/29/24 1611 07/30/24 0141  Weight: 174 lb 13.2 oz (79.3 kg) 167 lb 12.3 oz (76.1 kg)    GENERAL: alert, no distress and comfortable SKIN: skin color, texture, turgor are normal, no rashes or significant lesions EYES: normal, conjunctiva are pink and non-injected, sclera clear OROPHARYNX: no exudate, no erythema and lips, buccal mucosa, and tongue normal  NECK: supple, thyroid  normal size, non-tender, without nodularity LYMPH: no palpable lymphadenopathy in the cervical, axillary or inguinal LUNGS: clear to auscultation and percussion with normal breathing effort HEART: regular rate & rhythm and no murmurs and no lower extremity edema ABDOMEN: abdomen soft, non-tender and normal bowel sounds MUSCULOSKELETAL: no cyanosis of digits and no clubbing  PSYCH: + Altered mental status NEURO: no focal motor/sensory deficits   All questions were answered. The patient knows to call the clinic with any problems, questions or concerns.   The total time spent in the appointment was 40 minutes encounter with patient including review of chart and various tests results, discussions about plan of care and coordination of care plan  Olam JINNY Brunner, NP 07/30/2024 10:30 AM    Labs Reviewed:  Lab Results  Component Value Date   WBC 12.1 (H) 07/30/2024   HGB 10.4 (L) 07/30/2024   HCT 32.2 (L) 07/30/2024   MCV 76.8 (L) 07/30/2024   PLT 231 07/30/2024   Recent Labs    06/28/24 1352 06/29/24 0524 07/08/24 0052 07/28/24 1125 07/29/24 1406 07/29/24 2019 07/30/24 0607  NA 131*   < > 130* 138 133* 131* 134*  K 4.4   < > 4.7 4.6 4.5 3.9 3.7   CL 97*   < > 100 101 98 99 98  CO2 23   < > 24 25 21*  --  22  GLUCOSE 108*   < > 137* 110* 112* 104* 88  BUN 23   < > 9 14 16 12 11   CREATININE 1.09*   < > 0.81 0.81 1.04* 0.90 0.85  CALCIUM 9.6   < > 9.0 9.7 9.9  --  9.3  GFRNONAA 49*   < > >60  --  52*  --  >60  PROT 6.7  --   --  6.3 7.4  --   --   ALBUMIN 4.1  --   --   --  4.2  --   --   AST 18  --   --  16 27  --   --   ALT 10  --   --  10 13  --   --   ALKPHOS 68  --   --   --  73  --   --   BILITOT 0.6  --   --  0.7 0.7  --   --    < > = values in this interval not displayed.    Studies Reviewed:  DG Chest Port 1 View Result Date: 07/29/2024 EXAM: 1 VIEW XRAY OF THE CHEST 07/29/2024 08:50:00 PM COMPARISON: CT 10 / 16 / 25 CLINICAL HISTORY: fever FINDINGS: LUNGS AND PLEURA: No focal pulmonary opacity. No pleural effusion. No pneumothorax. HEART AND MEDIASTINUM: Cardiomegaly. Calcified aorta. BONES AND SOFT TISSUES: Old left rib fractures. IMPRESSION: 1. No acute cardiopulmonary process identified. Electronically signed by: Norman Gatlin MD 07/29/2024 09:10 PM EST RP Workstation: [id number]   CT ABDOMEN PELVIS W CONTRAST Result Date: 07/29/2024 EXAM: CT ABDOMEN AND PELVIS WITH CONTRAST 07/29/2024 04:33:59 PM TECHNIQUE: CT of the abdomen and pelvis was performed with the administration of 100 mL of iohexol (OMNIPAQUE) 300 MG/ML solution. Multiplanar reformatted images are provided for review. Automated exposure control, iterative reconstruction, and/or weight-based adjustment of the mA/kV was utilized to reduce the radiation dose to as low as reasonably achievable. COMPARISON: 07/02/2024 CLINICAL HISTORY: Abdominal pain, acute, nonlocalized; increased drainage from abd wound, constipation, component of fecal impaction on exam. FINDINGS: LOWER CHEST: No acute abnormality. LIVER: The liver is unremarkable. GALLBLADDER AND BILE DUCTS: Gallbladder is unremarkable. No biliary ductal dilatation. SPLEEN: No acute abnormality. PANCREAS: No  acute abnormality. ADRENAL GLANDS: No acute abnormality. KIDNEYS, URETERS AND BLADDER: No stones in the kidneys or ureters. No hydronephrosis. No perinephric or periureteral stranding. Urinary bladder is unremarkable. GI AND BOWEL: Status post right colectomy. Large amount of stool is seen in the sigmoid colon and rectum concerning for impaction. Stomach demonstrates no acute abnormality. There is no bowel obstruction. PERITONEUM AND RETROPERITONEUM: No ascites. No free air. VASCULATURE: Aorta is normal in caliber. LYMPH NODES: No lymphadenopathy. REPRODUCTIVE ORGANS: Status post hysterectomy. BONES AND SOFT TISSUES: No acute osseous abnormality. No focal soft tissue abnormality. IMPRESSION: 1. Large amount of stool in the sigmoid colon and rectum, concerning for fecal impaction. Electronically signed by: Lynwood Seip MD 07/29/2024 05:19 PM EST RP Workstation: [id number]   CT CHEST ABDOMEN PELVIS W CONTRAST Result Date: 07/02/2024 EXAM: CT CHEST, ABDOMEN AND PELVIS WITH CONTRAST 07/02/2024 11:22:16 AM TECHNIQUE: CT of the chest, abdomen and pelvis was performed with the administration of 100 mL (iohexol (OMNIPAQUE) 300 MG/ML solution 100 mL IOHEXOL 300 MG/ML SOLN) intravenous contrast. Multiplanar reformatted images are provided for review. Automated exposure control, iterative reconstruction, and/or weight based adjustment of the mA/kV was utilized to reduce the radiation dose to as low as reasonably achievable. COMPARISON: 06/28/2024 CLINICAL HISTORY: Metastatic disease evaluation. Per Dr.Pahwani notes:; Brief Narrative: ; KARESS HARNER is a 88 y.o. female with medical history significant for Alzheimer's dementia, HTN, HLD, chronic pain due to OA of both shoulders and chronic left shoulder dislocation who presented to the ED for evaluation of generalized weakness and ; no other complaint. She notes frequent urinary urgency. She was recently treated for UTI initially with ciprofloxacin . Urine culture grew  Enterococcus faecalis and antibiotics were switched to nitrofurantoin  which she has since completed FINDINGS: CHEST: MEDIASTINUM AND LYMPH NODES: Heart and pericardium are unremarkable. The central airways are clear. No mediastinal, hilar or axillary lymphadenopathy. Mild thoracic aortic atherosclerosis. Mild coronary atherosclerosis of the LAD and right coronary artery. LUNGS AND PLEURA: No focal consolidation or pulmonary edema. No pleural effusion or pneumothorax. Mild scarring in the bilateral lower lobes. ABDOMEN AND PELVIS: LIVER: The liver is unremarkable. GALLBLADDER AND BILE DUCTS: Gallbladder is unremarkable. No biliary ductal dilatation. SPLEEN: No acute abnormality. PANCREAS: No acute abnormality. ADRENAL GLANDS: No acute abnormality. KIDNEYS, URETERS AND BLADDER: No stones in the kidneys or ureters. No hydronephrosis. No perinephric or periureteral  stranding. Urinary bladder is unremarkable. GI AND BOWEL: Stomach demonstrates no acute abnormality. There is no bowel obstruction. REPRODUCTIVE ORGANS: Status post hysterectomy with new fluid within the vaginal apex (image 101). PERITONEUM AND RETROPERITONEUM: No ascites. No free air. VASCULATURE: Aorta is normal in caliber. Atherosclerotic calcifications of the abdominal aorta and branch vessels, although patent. ABDOMINAL AND PELVIS LYMPH NODES: No lymphadenopathy. BONES AND SOFT TISSUES: Severe degenerative changes of the right shoulder. Mild degenerative changes of the left shoulder with anterior subluxation. Degenerative changes of the visualized thoracolumbar spine. Status post L4-L5 PLIF. S-shaped thoracolumbar scoliosis. No acute osseous abnormality. No focal soft tissue abnormality. IMPRESSION: 1. Fluid in the vaginal apex, new from recent prior. Consider GYN evaluation. 2. Otherwise unchanged from recent CT. Chronic ancillary findings as above. 3. No findings suspicious for primary malignancy or metastatic disease. Electronically signed by:  Pinkie Pebbles MD 07/02/2024 08:23 PM EDT RP Workstation: [id number]

## 2024-07-30 NOTE — Consult Note (Signed)
 WOC Nurse Consult Note: patient underwent R colectomy Dr. Polly 07/02/2024; discharged to rehab and has since gone home; surgical wound with apparent dehiscence  Reason for Consult: midline abdominal wound  Wound type: full thickness surgical as above  Pressure Injury POA: NA not pressure  Measurement: see nursing flowsheet  Wound bed:one open area with tan fibrinous tissue surrounding; superior aspect and distal aspect with  tan fibrinous material  Drainage (amount, consistency, odor) purulent per MD note  Periwound: erythema and irritation likely due to effluent being on skin for prolonged period of time  Dressing procedure/placement/frequency: Cleanse abdominal wound with Vashe wound cleanser, do not rinse and allow to air dry. Apply Vashe moistened gauze to entire surgical incision, use a Q tip applicator to insert Kerlix moistened gauze into open area with a significant portion hanging from wound.  Cover with dry gauze and ABD pad/clothe tape.    Surgery has been consulted on patient. Any wound care orders placed by surgical team supercede wound care orders placed by WOC Rn.   WOC will not follow. Re-consult if further needs arise.   Thank you,    Powell Bar MSN, RN-BC, [employer]

## 2024-07-30 NOTE — Progress Notes (Signed)
 Subjective: Pt known to our service for lap assisted right colectomy by Dr. Polly on 10/16 for an ascending colon mass.  She was discharged to SNF on 10/24.  She was supposed to follow up with Dr. Polly today in the office, but got admitted to [facility] for concerns for impaction as well as some erythema around her midline wound which has opened up a small amount.  The son states they have been covering this as it has been draining some.  We have been asked to see her wound for further recommendations.   Objective: Vital signs in last 24 hours: Temp:  [97.8 F (36.6 C)-100.3 F (37.9 C)] 98.5 F (36.9 C) (11/13 1000) Pulse Rate:  [76-107] 77 (11/13 1000) Resp:  [15-20] 18 (11/13 0141) BP: (124-191)/(55-117) 133/56 (11/13 1000) SpO2:  [96 %-100 %] 97 % (11/13 1000) Weight:  [76.1 kg-79.3 kg] 76.1 kg (11/13 0141) Last BM Date : 07/30/24  Intake/Output from previous day: 11/12 0701 - 11/13 0700 In: 900.1 [I.V.:200.1; IV Piggyback:700] Out: -  Intake/Output this shift: Total I/O In: 1146.7 [P.O.:100; I.V.:946.7; IV Piggyback:100] Out: -   PE: Abd: soft, NT, obese, midline wound with some mild erythema, but doesn't really appear overtly infection.  Small opening along the central aspect of her midline incision.  Serous drainage from this is noted.  Small ulceration of the inferior aspect of her incision noted.  Opening is too small to explore under the wound opening.  No purulent drainage  Lab Results:  Recent Labs    07/29/24 1406 07/29/24 2019 07/30/24 0607  WBC 13.2*  --  12.1*  HGB 10.7* 11.9* 10.4*  HCT 34.2* 35.0* 32.2*  PLT 337  --  231   BMET Recent Labs    07/29/24 1406 07/29/24 2019 07/30/24 0607  NA 133* 131* 134*  K 4.5 3.9 3.7  CL 98 99 98  CO2 21*  --  22  GLUCOSE 112* 104* 88  BUN 16 12 11   CREATININE 1.04* 0.90 0.85  CALCIUM 9.9  --  9.3   PT/INR No results for input(s): LABPROT, INR in the last 72 hours. CMP     Component Value  Date/Time   NA 134 (L) 07/30/2024 0607   NA 136 (A) 10/20/2020 0000   K 3.7 07/30/2024 0607   CL 98 07/30/2024 0607   CO2 22 07/30/2024 0607   GLUCOSE 88 07/30/2024 0607   BUN 11 07/30/2024 0607   BUN 16 10/20/2020 0000   CREATININE 0.85 07/30/2024 0607   CREATININE 0.81 07/28/2024 1125   CALCIUM 9.3 07/30/2024 0607   PROT 7.4 07/29/2024 1406   ALBUMIN 4.2 07/29/2024 1406   AST 27 07/29/2024 1406   ALT 13 07/29/2024 1406   ALKPHOS 73 07/29/2024 1406   BILITOT 0.7 07/29/2024 1406   GFRNONAA >60 07/30/2024 0607   GFRNONAA 74 03/08/2021 0910   GFRAA 85 03/08/2021 0910   Lipase     Component Value Date/Time   LIPASE 26 07/29/2024 1406       Studies/Results: DG Chest Port 1 View Result Date: 07/29/2024 EXAM: 1 VIEW XRAY OF THE CHEST 07/29/2024 08:50:00 PM COMPARISON: CT 10 / 16 / 25 CLINICAL HISTORY: fever FINDINGS: LUNGS AND PLEURA: No focal pulmonary opacity. No pleural effusion. No pneumothorax. HEART AND MEDIASTINUM: Cardiomegaly. Calcified aorta. BONES AND SOFT TISSUES: Old left rib fractures. IMPRESSION: 1. No acute cardiopulmonary process identified. Electronically signed by: Norman Gatlin MD 07/29/2024 09:10 PM EST RP Workstation: [id number]  CT ABDOMEN PELVIS W CONTRAST Result Date: 07/29/2024 EXAM: CT ABDOMEN AND PELVIS WITH CONTRAST 07/29/2024 04:33:59 PM TECHNIQUE: CT of the abdomen and pelvis was performed with the administration of 100 mL of iohexol (OMNIPAQUE) 300 MG/ML solution. Multiplanar reformatted images are provided for review. Automated exposure control, iterative reconstruction, and/or weight-based adjustment of the mA/kV was utilized to reduce the radiation dose to as low as reasonably achievable. COMPARISON: 07/02/2024 CLINICAL HISTORY: Abdominal pain, acute, nonlocalized; increased drainage from abd wound, constipation, component of fecal impaction on exam. FINDINGS: LOWER CHEST: No acute abnormality. LIVER: The liver is unremarkable. GALLBLADDER AND BILE  DUCTS: Gallbladder is unremarkable. No biliary ductal dilatation. SPLEEN: No acute abnormality. PANCREAS: No acute abnormality. ADRENAL GLANDS: No acute abnormality. KIDNEYS, URETERS AND BLADDER: No stones in the kidneys or ureters. No hydronephrosis. No perinephric or periureteral stranding. Urinary bladder is unremarkable. GI AND BOWEL: Status post right colectomy. Large amount of stool is seen in the sigmoid colon and rectum concerning for impaction. Stomach demonstrates no acute abnormality. There is no bowel obstruction. PERITONEUM AND RETROPERITONEUM: No ascites. No free air. VASCULATURE: Aorta is normal in caliber. LYMPH NODES: No lymphadenopathy. REPRODUCTIVE ORGANS: Status post hysterectomy. BONES AND SOFT TISSUES: No acute osseous abnormality. No focal soft tissue abnormality. IMPRESSION: 1. Large amount of stool in the sigmoid colon and rectum, concerning for fecal impaction. Electronically signed by: Lynwood Seip MD 07/29/2024 05:19 PM EST RP Workstation: [id number]    Anti-infectives: Anti-infectives (From admission, onward)    Start     Dose/Rate Route Frequency Ordered Stop   07/30/24 2200  cefTRIAXone (ROCEPHIN) 1 g in sodium chloride  0.9 % 100 mL IVPB        1 g 200 mL/hr over 30 Minutes Intravenous Every 24 hours 07/29/24 2142     07/30/24 2200  vancomycin (VANCOREADY) IVPB 1250 mg/250 mL        1,250 mg 166.7 mL/hr over 90 Minutes Intravenous Every 24 hours 07/30/24 0016     07/30/24 1000  metroNIDAZOLE (FLAGYL) IVPB 500 mg        500 mg 100 mL/hr over 60 Minutes Intravenous Every 12 hours 07/29/24 2142     07/29/24 2130  cefTRIAXone (ROCEPHIN) 2 g in sodium chloride  0.9 % 100 mL IVPB        2 g 200 mL/hr over 30 Minutes Intravenous Once 07/29/24 2123 07/29/24 2220   07/29/24 2130  metroNIDAZOLE (FLAGYL) IVPB 500 mg        500 mg 100 mL/hr over 60 Minutes Intravenous  Once 07/29/24 2123 07/29/24 2310   07/29/24 2130  vancomycin (VANCOCIN) IVPB 1000 mg/200 mL premix         1,000 mg 200 mL/hr over 60 Minutes Intravenous  Once 07/29/24 2123 07/30/24 0029        Assessment/Plan S/p R hemicolectomy for ascending colon mass, Dr. Polly on 10/16 with small opening of her wound -some mild erythema but does not appear overtly infected.  Could give 5 days of Keflex for this given her WBC is mildly elevated.  Don't see evidence of that she needs vanc/rocephin for this wound -wound care orders in place -will get follow up for her with Dr. Polly in a couple of weeks.  Son discussed possible transition to hospice for patient as well.  He knows to call and cancel if follow up not needed. -no further surgical needs.  We will sign off    LOS: 1 day    Burnard FORBES Banter , [facility]  Surgery 07/30/2024, 12:44 PM Please see Amion for pager number during day hours 7:00am-4:30pm or 7:00am -11:30am on weekends

## 2024-07-30 NOTE — Discharge Instructions (Signed)
WOUND CARE: - midline dressing to be changed daily - supplies: sterile saline, gauze, scissors, tape  - remove dressing and all packing carefully, moistening with sterile saline as needed to avoid packing/internal dressing sticking to the wound. - clean edges of skin around the wound with water/gauze, making sure there is no tape debris or leakage left on skin that could cause skin irritation or breakdown. - dampen and clean gauze with sterile saline and pack wound from wound base to skin level, making sure to take note of any possible areas of wound tracking, tunneling and packing appropriately. Wound can be packed loosely. Trim gauze to size if a whole gauze is not required. - cover wound with a dry gauze and secure with tape.  - write the date/time on the dry dressing/tape to better track when the last dressing change occurred. - change dressing as needed if leakage occurs, wound gets contaminated, or patient requests to shower. - patient may shower daily with wound open (i.e. remove all packing) and following the shower the wound should be dried and a clean dressing placed.

## 2024-07-30 NOTE — Evaluation (Addendum)
 Clinical/Bedside Swallow Evaluation Patient Details  Name: Amy Andrade MRN: [id number] Date of Birth: 10-Nov-1935  Today's Date: 07/30/2024 Time: SLP Start Time (ACUTE ONLY): 1649 SLP Stop Time (ACUTE ONLY): 1700 SLP Time Calculation (min) (ACUTE ONLY): 11 min  Past Medical History:  Past Medical History:  Diagnosis Date   Alzheimer disease (HCC)    Asthma    Cancer (HCC)    Hyperlipidemia    Hypertension    Pulmonary embolism (HCC) 2004   was short of breath--was on coumadin for 6 mos to a year   Past Surgical History:  Past Surgical History:  Procedure Laterality Date   ABDOMINAL HYSTERECTOMY     BUNIONECTOMY     CATARACT EXTRACTION Bilateral 06/17/2022   COLONOSCOPY Left 07/01/2024   Procedure: COLONOSCOPY;  Surgeon: Kristie Lamprey, MD;  Location: [facility];  Service: Gastroenterology;  Laterality: Left;  IDA   ESOPHAGOGASTRODUODENOSCOPY Left 07/01/2024   Procedure: EGD (ESOPHAGOGASTRODUODENOSCOPY);  Surgeon: Kristie Lamprey, MD;  Location: [facility];  Service: Gastroenterology;  Laterality: Left;  IDA   LAPAROSCOPIC RIGHT COLECTOMY N/A 07/02/2024   Procedure: COLECTOMY, RIGHT, LAPAROSCOPIC;  Surgeon: Polly Cordella LABOR, MD;  Location: [facility];  Service: General;  Laterality: N/A;   REPLACEMENT TOTAL KNEE  1980s, 2003   x2   HPI:  88 yo female adm to [facility] on 07/29/2024 - has h/o recent s/p partial colectomy and 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  She has PMH + for Alzhemier's dementia, femur fx, asthma, ETOH, and anemia.  Swallow eval ordered. CT brain negative for acute event, atrophy, CT chest/abdomen on 10/21 showed . No pleural effusion or pneumothorax.  Mild scarring in the bilateral lower lobes.   Hospitalist ordered swallow evaluation due to dementia and higher aspiration risk.  Pt denies dysphagia but admits to have some issues with pain    Assessment / Plan / Recommendation  Clinical Impression  Patient presents with functional oropharyngeal  swallow ability. NO s/s of aspiration with all PO observed including thin, puree and solids. She was able to adequately masticate and orally clear boluses. Respiratory and swallow reciprocity intact.   She does appear with questionable scar tissue on interior buccal region on the left and she admits to biting her cheek on occasion.  Pt reports discomfort with sitting upright for meals.   but denies dysphagia symptoms.  Recommended she consider *if she has pain medication* to request it before she eats to allow maximum comfort/positioning for safe po. She has h/o hiatal hernia but denies any reflux or heartburn symptoms.    No SLP follow up needed. SLP Visit Diagnosis: Dysphagia, unspecified (R13.10)    Aspiration Risk  No limitations    Diet Recommendation Regular;Thin liquid    Liquid Administration via: Cup;Straw Medication Administration: Whole meds with liquid Supervision: Patient able to self feed Compensations: Slow rate;Small sips/bites Postural Changes: Seated upright at 90 degrees;Remain upright for at least 30 minutes after po intake    Other  Recommendations Oral Care Recommendations: Oral care BID Caregiver Recommendations: Other (Comment)     Assistance Recommended at Discharge  N/a  Functional Status Assessment Patient has not had a recent decline in their functional status  Frequency and Duration   N/a         Prognosis        Swallow Study   General Date of Onset: 07/08/24 HPI: 88 yo female adm to [facility] on 07/29/2024 - has h/o recent s/p partial colectomy and 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  She has PMH + for Alzhemier's dementia, femur fx, asthma, ETOH, and anemia.  Swallow eval ordered. CT brain negative for acute event, atrophy, CT chest/abdomen on 10/21 showed . No pleural effusion or pneumothorax.  Mild scarring in the bilateral lower lobes.   Hospitalist ordered swallow evaluation due to dementia and higher aspiration risk.  Pt denies  dysphagia but admits to have some issues with pain Diet Prior to this Study: Thin liquids (Level 0);Dysphagia 3 (mechanical soft) Respiratory Status: Room air History of Recent Intubation: No Behavior/Cognition: Alert;Cooperative;Pleasant mood Oral Cavity Assessment: Within Functional Limits Oral Care Completed by SLP: No Oral Cavity - Dentition: Adequate natural dentition Vision: Functional for self-feeding Self-Feeding Abilities: Able to feed self Patient Positioning: Upright in bed Baseline Vocal Quality: Normal Volitional Cough: Other (Comment) (weak likely from concern for abdomen pain) Volitional Swallow: Able to elicit    Oral/Motor/Sensory Function Overall Oral Motor/Sensory Function: Within functional limits   Ice Chips Ice chips: Not tested   Thin Liquid Thin Liquid: Within functional limits Presentation: Cup;Straw    Nectar Thick Nectar Thick Liquid: Not tested   Honey Thick Honey Thick Liquid: Not tested   Puree Puree: Within functional limits Presentation: Self Fed;Spoon   Solid     Solid: Within functional limits Presentation: Self Fed;Spoon      [name] 07/30/2024,5:23 PM  Madelin POUR, MS [facility] SLP Acute Rehab Services Office [phone]

## 2024-07-31 ENCOUNTER — Inpatient Hospital Stay (HOSPITAL_COMMUNITY): Admission: RE | Admit: 2024-07-31 | Source: Ambulatory Visit

## 2024-07-31 DIAGNOSIS — R6520 Severe sepsis without septic shock: Secondary | ICD-10-CM | POA: Diagnosis not present

## 2024-07-31 DIAGNOSIS — A419 Sepsis, unspecified organism: Secondary | ICD-10-CM | POA: Diagnosis not present

## 2024-07-31 NOTE — Evaluation (Signed)
 Physical Therapy Evaluation Patient Details Name: Amy Andrade MRN: [id number] DOB: 1936/02/11 Today's Date: 07/31/2024  History of Present Illness  88 yo female presents to therapy following hospital admission on 07/29/2024 due to c/o constipation and drainage from from abdominal wound. Pt presented to ED 06/28/24 with weakness, anemia, FOBT, severe, B12 deficiency and required 1 unit PRBC on 10/12, EGD, hiatal hernia and lesions and colonoscopy 10/15 notable for non-obstructing colon mass, pt underwent laparoscopic assisted right colectomy on 07/02/2024 due to mass. Pt PMH includes but is not limited to: Alzheimer dementia, asthma, chronic pain, HTN.chronic pain due to OA of both shoulders and chronic left shoulder dislocation  Clinical Impression    Pt admitted with above diagnosis.  Pt currently with functional limitations due to the deficits listed below (see PT Problem List). Pt in bed when PT arrived. Pt perseverating on use of bathroom reporting she needs to void bladder. Pt agreeable to attempt to get OOB. Pt required mod A for supine <> sit, CGA for sitting balance with flexed posture, min A for SPT bed <> BSC, min A for static standing balance with B UE support. Pt ed provided on use of call bell requesting staff assist and awaiting until arrival pt repetitively asking how she can get to the bathroom. Pt able to locate call bell and red button however ~ 30-45s later inquiring how she can get up and go to the bathroom. Pt will require reinforcement for use of call bell. Pt left in bed, all needs in place. Patient will benefit from continued inpatient follow up therapy, <3 hours/day.  Pt will benefit from acute skilled PT to increase their independence and safety with mobility to allow discharge.         If plan is discharge home, recommend the following: Help with stairs or ramp for entrance;Assist for transportation;Direct supervision/assist for financial management;A lot of help with  bathing/dressing/bathroom;Two people to help with walking and/or transfers   Can travel by private vehicle   No    Equipment Recommendations Other (comment) (TBD in next venue)  Recommendations for Other Services       Functional Status Assessment Patient has had a recent decline in their functional status and demonstrates the ability to make significant improvements in function in a reasonable and predictable amount of time.     Precautions / Restrictions Precautions Precautions: Fall Recall of Precautions/Restrictions: Impaired Precaution/Restrictions Comments: severe  bilateral shoulder AROM impaired, 0-60 degrees FF; significant crepitus noted with R shoulder movement Restrictions Weight Bearing Restrictions Per Provider Order: No      Mobility  Bed Mobility Overal bed mobility: Needs Assistance Bed Mobility: Supine to Sit, Sit to Supine   Sidelying to sit: Mod assist, HOB elevated, Used rails Supine to sit: Mod assist     General bed mobility comments: assist with trunk and LE management to EOB with cues and increased time supine to sit with use of hospital bed    Transfers Overall transfer level: Needs assistance Equipment used: Rolling walker (2 wheels) Transfers: Sit to/from Stand, Bed to chair/wheelchair/BSC Sit to Stand: Min assist   Step pivot transfers: Min assist       General transfer comment: min cues, increased time, pt attempting to sit prior to proper alignment with BSC with pt movtivated and perceverating on use of bathroom/voiding    Ambulation/Gait               General Gait Details: NT  Stairs  Wheelchair Mobility     Tilt Bed    Modified Rankin (Stroke Patients Only)       Balance Overall balance assessment: Needs assistance Sitting-balance support: Bilateral upper extremity supported, Feet supported Sitting balance-Leahy Scale: Fair Sitting balance - Comments: flexed posture and cues for maintaining B UE  support on floor   Standing balance support: Bilateral upper extremity supported, During functional activity, Reliant on assistive device for balance Standing balance-Leahy Scale: Poor Standing balance comment: min A with B UE support at RW                             Pertinent Vitals/Pain Pain Assessment Pain Assessment: 0-10 Faces Pain Scale: Hurts a little bit Pain Location: abdomen Pain Descriptors / Indicators: Constant, Discomfort, Guarding Pain Intervention(s): Limited activity within patient's tolerance, Monitored during session, Repositioned    Home Living Family/patient expects to be discharged to:: Private residence Living Arrangements: Children Available Help at Discharge: Family;Available 24 hours/day Type of Home: House         Home Layout: Able to live on main level with bedroom/bathroom Home Equipment: Rolling Walker (2 wheels);Shower seat;Wheelchair - manual;Shower seat - built in Additional Comments: son present for info at prior eval, lift chair.    Prior Function Prior Level of Function : Needs assist;Patient poor historian/Family not available (information below taken from prior admission pt unable to relay PLOF nor home living set up other than living with son)             Mobility Comments: has been ambulatory with RW and no assistance ADLs Comments: Ind with ADLs, toileting, sponge bathes with daughter in law assist, has been able to go to BR with RW.     Extremity/Trunk Assessment   Upper Extremity Assessment Upper Extremity Assessment: Defer to OT evaluation RUE Deficits / Details: limited  shoulder AROM impaired, 0-60 degrees FF; significant crepitus noted with R shoulder movement LUE Deficits / Details: limited shoulder flex    Lower Extremity Assessment Lower Extremity Assessment: Generalized weakness    Cervical / Trunk Assessment Cervical / Trunk Assessment: Normal  Communication   Communication Communication:  Impaired Factors Affecting Communication: Difficulty expressing self    Cognition Arousal: Alert Behavior During Therapy: WFL for tasks assessed/performed, Anxious   PT - Cognitive impairments: History of cognitive impairments, No family/caregiver present to determine baseline                       PT - Cognition Comments: poor recall/STM ~ 30-45 seconds Following commands: Intact Following commands impaired: Follows one step commands with increased time     Cueing Cueing Techniques: Gestural cues, Tactile cues, Verbal cues     General Comments      Exercises     Assessment/Plan    PT Assessment Patient needs continued PT services  PT Problem List Decreased strength       PT Treatment Interventions DME instruction;Patient/family education;Gait training;Functional mobility training;Therapeutic activities;Therapeutic exercise;Cognitive remediation    PT Goals (Current goals can be found in the Care Plan section)  Acute Rehab PT Goals PT Goal Formulation: Patient unable to participate in goal setting    Frequency Min 2X/week     Co-evaluation               AM-PAC PT 6 Clicks Mobility  Outcome Measure Help needed turning from your back to your side while in a flat bed without using  bedrails?: A Lot Help needed moving from lying on your back to sitting on the side of a flat bed without using bedrails?: A Lot Help needed moving to and from a bed to a chair (including a wheelchair)?: Total Help needed standing up from a chair using your arms (e.g., wheelchair or bedside chair)?: A Lot Help needed to walk in hospital room?: Total Help needed climbing 3-5 steps with a railing? : Total 6 Click Score: 9    End of Session Equipment Utilized During Treatment: Gait belt Activity Tolerance: Patient limited by fatigue Patient left: in bed;with bed alarm set;with call bell/phone within reach Nurse Communication: Mobility status PT Visit Diagnosis: Unsteadiness  on feet (R26.81);Pain;Difficulty in walking, not elsewhere classified (R26.2);Muscle weakness (generalized) (M62.81) Pain - part of body:  (abdomen)    Time: 8295-8269 PT Time Calculation (min) (ACUTE ONLY): 26 min   Charges:   PT Evaluation $PT Eval Low Complexity: 1 Low PT Treatments $Therapeutic Activity: 8-22 mins PT General Charges $$ ACUTE PT VISIT: 1 Visit         [place], PT Acute Rehab   [place] VEAR Drone 07/31/2024, 6:57 PM

## 2024-07-31 NOTE — Consult Note (Signed)
 Consultation Note Date: 07/31/2024   Patient Name: Amy Andrade  DOB: [birth year]  MRN: [id number]  Age / Sex: 88 y.o., female  PCP: Ngetich, Roxan BROCKS, NP Referring Physician: Cheryle Page, MD  Reason for Consultation: Establishing goals of care  HPI/Patient Profile: 88 y.o. female admitted on 07/29/2024   Admitted for altered mental status and constipation. Clinical Assessment and Goals of Care: 88 year old lady who underwent right hemicolectomy for ascending colon mass on October 16 admitted with small opening of her wound with mild erythema, general surgery consulted, placed on antibiotics and wound care orders have also been given. Of note, patient also to be followed by medical oncology, awaiting final recommendations after oncology discussions with the patient's son Amy Andrade. As per [facility], patient is from home with family, after her surgery last month she was discharged to [facility] for rehab and had since returned home. Patient has a life-limiting illness of moderately differentiated adenocarcinoma.  Due to stage IIa disease, considering multiple comorbidities dementia and functional status, surveillance alone per NCCN guidelines was being considered.  Dr. Autumn is her medical oncologist. Additional serious irreversible illness of Alzheimer's dementia.  Patient has underlying history of iron deficiency anemia and B12 deficiency. Palliative consult continues Palliative medicine is specialized medical care for people living with serious illness. It focuses on providing relief from the symptoms and stress of a serious illness. The goal is to improve quality of life for both the patient and the family. Goals of care: Broad aims of medical therapy in relation to the patient's values and preferences. Our aim is to provide medical care aimed at enabling patients to achieve the goals that matter most to  them, given the circumstances of their particular medical situation and their constraints.   Patient is awake alert sitting up in bed, she is able to state her name, safety sitter at bedside.  Patient does not know why she is in the hospital.  [facility], advance care planning documents are scanned on the chart, healthcare power of attorney paperwork reviewed today, the time of initial consultation.  SUMMARY OF RECOMMENDATIONS   Agree with DNR Continue current mode of care Patient remains on antibiotics and wound care as per general surgery recommendations Will follow-up with family specifically HCPOA son Andrade on 08-01-2024 following his discussion with medical oncology today.  It certainly appears appropriate to consider a more comfort-focused mode of care that could include addition of hospice services in the outpatient setting, palliative care to continue to follow the patient and continue goals of care discussions.  Further recommendations to follow on 08-01-2024. Thank you for the consult.  Code Status/Advance Care Planning: DNR   Symptom Management:     Palliative Prophylaxis:  Delirium Protocol   Psycho-social/Spiritual:  Desire for further Chaplaincy support:yes Additional Recommendations: Caregiving  Support/Resources  Prognosis:  Unable to determine  Discharge Planning: To Be Determined      Primary Diagnoses: Present on Admission:  Severe sepsis (HCC)   I have reviewed the medical record, interviewed the  patient and family, and examined the patient. The following aspects are pertinent.  Past Medical History:  Diagnosis Date   Alzheimer disease (HCC)    Asthma    Cancer (HCC)    Hyperlipidemia    Hypertension    Pulmonary embolism (HCC) 2004   was short of breath--was on coumadin for 6 mos to a year   Social History   Socioeconomic History   Marital status: Widowed    Spouse name: Not on file   Number of children: 2   Years of education:  Not on file   Highest education level: Not on file  Occupational History   Not on file  Tobacco Use   Smoking status: Former    Current packs/day: 0.00    Average packs/day: 0.3 packs/day for 4.0 years (1.0 ttl pk-yrs)    Types: Cigarettes    Start date: 09/17/1953    Quit date: 09/17/1957    Years since quitting: 66.9   Smokeless tobacco: Never  Vaping Use   Vaping status: Never Used  Substance and Sexual Activity   Alcohol use: Yes    Alcohol/week: 2.0 standard drinks of alcohol    Types: 2 Glasses of wine per week    Comment: 1 4oz cup with lunch and dinner   Drug use: No   Sexual activity: Not Currently  Other Topics Concern   Not on file  Social History Narrative   Diet: Eats well      Do you drink/ eat things with caffeine?Yes, 2 cups of coffee a day      Marital status:    Married--widowed in 2017                           What year were you married ? 1966      Do you live in a house, apartment,assistred living, condo, trailer, etc.)? House      Is it one or more stories? 2      How many persons live in your home ?  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  Yes                            Type & how often: Stationary bike 2 x/week      Do you have a living will? Yes      Do you have a DNR form?  Yes                     If not, do you want to discuss one?       Do you have signed POA?HPOA forms?   Yes              If so, please bring to your        appointment      Social Drivers of Health   Financial Resource Strain: Low Risk  (11/18/2017)   Overall Financial Resource Strain (CARDIA)    Difficulty of Paying Living Expenses: Not hard at all  Food Insecurity: No Food Insecurity (07/30/2024)   Hunger Vital Sign    Worried About Running Out of Food in the Last Year: Never true    Ran Out of Food in the Last Year: Never  true  Transportation Needs: No Transportation Needs  (07/30/2024)   PRAPARE - Administrator, Civil Service (Medical): No    Lack of Transportation (Non-Medical): No  Physical Activity: Inactive (11/18/2017)   Exercise Vital Sign    Days of Exercise per Week: 0 days    Minutes of Exercise per Session: 0 min  Stress: No Stress Concern Present (11/18/2017)   [employer] of Occupational Health - Occupational Stress Questionnaire    Feeling of Stress : Only a little  Social Connections: Socially Isolated (07/30/2024)   Social Connection and Isolation Panel    Frequency of Communication with Friends and Family: More than three times a week    Frequency of Social Gatherings with Friends and Family: More than three times a week    Attends Religious Services: Never    Database Administrator or Organizations: No    Attends Banker Meetings: Never    Marital Status: Widowed   Family History  Problem Relation Age of Onset   Alzheimer's disease Mother    Chronic bronchitis Mother    Heart disease Father    High blood pressure Father    High blood pressure Brother    Asthma Daughter    High blood pressure Son    High Cholesterol Son    Post-traumatic stress disorder Son    Scheduled Meds:  amLODipine   10 mg Oral Daily   aspirin  EC  81 mg Oral Daily   docusate sodium  100 mg Oral BID   enoxaparin  (LOVENOX ) injection  40 mg Subcutaneous Q24H   irbesartan   300 mg Oral Daily   pravastatin   40 mg Oral Daily   Continuous Infusions:  cefTRIAXone (ROCEPHIN)  IV 1 g (07/30/24 2210)   vancomycin 1,250 mg (07/30/24 2306)   PRN Meds:.acetaminophen  **OR** acetaminophen , albuterol , bisacodyl, haloperidol lactate, ondansetron  **OR** ondansetron  (ZOFRAN ) IV, mouth rinse, polyethylene glycol, traZODone Medications Prior to Admission:  Prior to Admission medications   Medication Sig Start Date End Date Taking? Authorizing Provider  amLODipine  (NORVASC ) 10 MG tablet Take 1 tablet (10 mg total) by mouth daily. Patient taking  differently: Take 10 mg by mouth in the morning. 06/02/24  Yes Ngetich, Dinah C, NP  aspirin  81 MG tablet Take 81 mg by mouth in the morning.   Yes [provider]  CALCIUM-VITAMIN D PO Take 1 tablet by mouth 2 (two) times daily.   Yes [provider]  doxycycline (VIBRA-TABS) 100 MG tablet Take 1 tablet (100 mg total) by mouth 2 (two) times daily for 10 days. 07/28/24 08/07/24 Yes Ngetich, Dinah C, NP  HYDROcodone -acetaminophen  (NORCO/VICODIN) 5-325 MG tablet Take 1 tablet by mouth every 12 (twelve) hours as needed for severe pain (pain score 7-10). Patient taking differently: Take 1 tablet by mouth as needed for severe pain (pain score 7-10) (shoulder pain keeping her awake). 06/22/24  Yes Ngetich, Dinah C, NP  irbesartan  (AVAPRO ) 300 MG tablet Take 1 tablet (300 mg total) by mouth daily. Patient taking differently: Take 300 mg by mouth in the morning. 06/02/24  Yes Ngetich, Dinah C, NP  methocarbamol  (ROBAXIN ) 500 MG tablet Take 1 tablet by mouth once daily Patient taking differently: Take 500 mg by mouth in the morning. 04/27/24  Yes Ngetich, Dinah C, NP  nitroGLYCERIN  (NITROSTAT ) 0.4 MG SL tablet Place 1 tablet (0.4 mg total) under the tongue every 5 (five) minutes x 3 doses as needed for chest pain. 10/25/20  Yes Medina-Vargas, Monina C, NP  pravastatin  (  PRAVACHOL ) 40 MG tablet Take 1 tablet (40 mg total) by mouth daily. Patient taking differently: Take 40 mg by mouth in the morning. 06/02/24  Yes Ngetich, Dinah C, NP  nystatin  cream (MYCOSTATIN ) Apply 1 application  topically 2 (two) times daily. Patient not taking: Reported on 07/29/2024 02/28/22   Ngetich, Roxan BROCKS, NP   No Known Allergies Review of Systems Confused which is probably her baseline Physical Exam Elderly lady resting in bed Safety sitter at bedside Awake alert Confused, does not know why she is in the hospital As per surgery notes, mild erythema on midline wound small opening with serous drainage and small  ulceration noted No purulent drainage  Vital Signs: BP (!) 139/56 (BP Location: Right Arm)   Pulse 64   Temp 98.1 F (36.7 C) (Oral)   Resp 17   Ht 5' 10 (1.778 m)   Wt 76.3 kg   SpO2 94%   BMI 24.14 kg/m  Pain Scale: Faces   Pain Score: Asleep   SpO2: SpO2: 94 % O2 Device:SpO2: 94 % O2 Flow Rate: .   IO: Intake/output summary:  Intake/Output Summary (Last 24 hours) at 07/31/2024 1520 Last data filed at 07/31/2024 1400 Gross per 24 hour  Intake 1371.27 ml  Output 1050 ml  Net 321.27 ml    LBM: Last BM Date : 07/30/24 Baseline Weight: Weight: 79.3 kg Most recent weight: Weight: 76.3 kg     Palliative Assessment/Data:   Palliative performance scale 40%  Time In: 1400 Time Out: 1500 Time Total: 60 Greater than 50%  of this time was spent counseling and coordinating care related to the above assessment and plan.  Signed by: Lonia Serve, MD   Please contact Palliative Medicine Team phone at [phone] for questions and concerns.  For individual provider: See Tracey

## 2024-07-31 NOTE — Plan of Care (Incomplete)
  Problem: Clinical Measurements: Goal: Will remain free from infection Outcome: Progressing Goal: Diagnostic test results will improve Outcome: Progressing   Problem: Activity: Goal: Risk for activity intolerance will decrease Outcome: Progressing   Problem: Nutrition: Goal: Adequate nutrition will be maintained Outcome: Progressing   Problem: Coping: Goal: Level of anxiety will decrease Outcome: Progressing   Problem: Education: Goal: Knowledge of General Education information will improve Description: Including pain rating scale, medication(s)/side effects and non-pharmacologic comfort measures Outcome: Not Progressing   Problem: Health Behavior/Discharge Planning: Goal: Ability to manage health-related needs will improve Outcome: Not Progressing   Problem: Clinical Measurements: Goal: Ability to maintain clinical measurements within normal limits will improve Outcome: Not Progressing

## 2024-07-31 NOTE — Progress Notes (Signed)
 PROGRESS NOTE    Amy Andrade  FMW:[id number] DOB: May 21, 1936 DOA: 07/29/2024 PCP: [name], NP   Brief Narrative:  88 y.o. 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  She presented with increased confusion and discharged from abdominal incision.  On presentation, she was found to have severe sepsis possibly associated with cellulitis of her midline abdominal wound along with stool impaction requiring manual disimpaction in the ED and an enema.  She was started on IV fluids and antibiotics.  General surgery, oncology and palliative care consulted.  Assessment & Plan:   Severe sepsis: Present on admission Wound dehiscence with associated cellulitis - Presented with tachycardia, leukocytosis, lactic acidosis with possible cellulitis around her abdominal surgical site -Continue broad-spectrum antibiotics.  Follow cultures.  General surgery recommended conservative management with antibiotics with no need for surgical intervention and signed off on 07/30/2024.  New diagnosis of adenocarcinoma of colon - Had colon resection during recent hospitalization last month.  Oncology evaluation appreciated: Recommended surveillance alone considering multiple comorbidities, dementia and functional status.  Constipation and stool impaction -Needed manual disimpaction and enema treatment in the ED resulting in subsequent large bowel movement.  Continue bowel regimen  Hypertension -Continue amlodipine  and irbesartan .  Blood pressure stable  Hyperlipidemia -Continue statin  Iron deficiency anemia -In the setting of recent acute blood loss anemia.  Hemoglobin  stable.  Monitor intermittently  Leukocytosis - Mild.  Monitor.  No labs today  Hyponatremia - No labs today.  Monitor  Acute metabolic acidosis -Improved  Lactic acidosis -Improved  Goals of care -Overall prognosis is guarded to poor.   - Palliative care evaluation pending.  Dementia Delirium -Monitor mental status.  Fall precaution.  Delirium precautions.  Physical deconditioning - PT eval   DVT prophylaxis: Lovenox  Code Status: Full Family Communication: Son at bedside on 07/30/2024 Disposition Plan: Status is: Inpatient Remains inpatient appropriate because: Of severity of illness    Consultants: General surgery/oncology/palliative care  Procedures: None  Antimicrobials: Rocephin, vancomycin and Flagyl from 07/29/2024 onwards   Subjective: Patient seen and examined at bedside.  No agitation, seizures, vomiting reported. Objective: Vitals:   07/30/24 1420 07/31/24 0335 07/31/24 0500 07/31/24 0626  BP: 134/61 (!) 138/117  (!) 141/56  Pulse: 79 76  65  Resp:  20  20  Temp: 98.8 F (37.1 C) 98.3 F (36.8 C)    TempSrc: Oral Oral    SpO2: 100% 98%  97%  Weight:   76.3 kg   Height:        Intake/Output Summary (Last 24 hours) at 07/31/2024 0750 Last data filed at 07/31/2024 0622 Gross per 24 hour  Intake 2157.97 ml  Output 1050 ml  Net 1107.97 ml   Filed Weights   07/29/24 1611 07/30/24 0141 07/31/24 0500  Weight: 79.3 kg 76.1 kg 76.3 kg    Examination:  General: On room air.  No distress ENT/neck: No thyromegaly.  JVD is not elevated  respiratory: Decreased breath sounds at bases bilaterally with some crackles; no wheezing  CVS: S1-S2 heard, rate controlled currently Abdominal: Soft, nontender, slightly distended; no organomegaly,  bowel sounds are heard Extremities: Trace lower extremity edema; no cyanosis  CNS: More awake today; slow to respond; confused.  Very poor historian.  No focal neurologic deficit.  Moves extremities Lymph: No  obvious lymphadenopathy Skin: Abdominal wall has a dressing  psych: Very flat affect.  Currently not agitated. musculoskeletal: No obvious joint swelling/deformity     Data Reviewed: I have personally reviewed following labs and imaging studies  CBC: Recent Labs  Lab 07/28/24 1125 07/29/24 1406 07/29/24 2019 07/30/24 0607  WBC 8.8 13.2*  --  12.1*  NEUTROABS 6,908 11.0*  --   --   HGB 10.0* 10.7* 11.9* 10.4*  HCT 32.8* 34.2* 35.0* 32.2*  MCV 78.7* 78.3*  --  76.8*  PLT 350 337  --  231   Basic Metabolic Panel: Recent Labs  Lab 07/28/24 1125 07/29/24 1406 07/29/24 2019 07/30/24 0607  NA 138 133* 131* 134*  K 4.6 4.5 3.9 3.7  CL 101 98 99 98  CO2 25 21*  --  22  GLUCOSE 110* 112* 104* 88  BUN 14 16 12 11   CREATININE 0.81 1.04* 0.90 0.85  CALCIUM 9.7 9.9  --  9.3   GFR: Estimated Creatinine Clearance: 51.4 mL/min (by C-G formula based on SCr of 0.85 mg/dL). Liver Function Tests: Recent Labs  Lab 07/28/24 1125 07/29/24 1406  AST 16 27  ALT 10 13  ALKPHOS  --  73  BILITOT 0.7 0.7  PROT 6.3 7.4  ALBUMIN  --  4.2   Recent Labs  Lab 07/29/24 1406  LIPASE 26   No results for input(s): AMMONIA in the last 168 hours. Coagulation Profile: No results for input(s): INR, PROTIME in the last 168 hours. Cardiac Enzymes: No results for input(s): CKTOTAL, CKMB, CKMBINDEX, TROPONINI in the last 168 hours. BNP (last 3 results) No results for input(s): PROBNP in the last 8760 hours. HbA1C: No results for input(s): HGBA1C in the last 72 hours. CBG: No results for input(s): GLUCAP in the last 168 hours. Lipid Profile: No results for input(s): CHOL, HDL, LDLCALC, TRIG, CHOLHDL, LDLDIRECT in the last 72 hours. Thyroid  Function Tests: No results for input(s): TSH, T4TOTAL, FREET4, T3FREE, THYROIDAB in the last 72 hours. Anemia Panel: No results for input(s): VITAMINB12, FOLATE, FERRITIN, TIBC, IRON, RETICCTPCT in  the last 72 hours. Sepsis Labs: Recent Labs  Lab 07/29/24 2022 07/29/24 2223  LATICACIDVEN 2.5* 0.7    Recent Results (from the past 240 hours)  Urine Culture     Status: None   Collection Time: 07/29/24  2:17 PM   Specimen: Urine, Catheterized  Result Value Ref Range Status   Specimen Description   Final    URINE, CATHETERIZED Performed at [facility], [address] [place], [place] [address]    Special Requests   Final    NONE Performed at [facility], [address] [place], [place] [address]    Culture   Final    NO GROWTH Performed at [facility], [address] [place], [place] [address]    Report Status 07/30/2024 FINAL  Final  Blood culture (routine x 2)     Status: None (Preliminary result)   Collection Time: 07/29/24  9:45 PM   Specimen: BLOOD  Result Value Ref Range Status   Specimen Description   Final    BLOOD BLOOD RIGHT FOREARM Performed at [facility], [address] [place], [place] [address]    Special Requests   Final    Blood Culture results may not be optimal due to an inadequate volume of blood received in culture bottles BOTTLES DRAWN AEROBIC ONLY Performed  at [facility], [address], [place], [place] [address]    Culture   Final    NO GROWTH 1 DAY Performed at [facility], [address], [place], [place] [address]    Report Status PENDING  Incomplete  Blood culture (routine x 2)     Status: None (Preliminary result)   Collection Time: 07/30/24  6:07 AM   Specimen: BLOOD  Result Value Ref Range Status   Specimen Description   Final    BLOOD BLOOD RIGHT HAND Performed at [facility], [address], [place], [place] [address]    Special Requests   Final    BOTTLES DRAWN AEROBIC ONLY Blood Culture results may not be optimal due to an inadequate volume of blood received in culture bottles Performed at [facility], [address], [place], [place] [address]    Culture   Final    NO GROWTH < 24 HOURS Performed at [facility], [address], [place], [place] [address]    Report Status PENDING  Incomplete         Radiology Studies: DG Chest Port 1 View Result Date: 07/29/2024 EXAM: 1 VIEW XRAY OF THE CHEST 07/29/2024 08:50:00 PM COMPARISON: CT 10 / 16 / 25 CLINICAL HISTORY: fever FINDINGS: LUNGS AND PLEURA: No focal pulmonary opacity. No pleural effusion. No pneumothorax. HEART AND MEDIASTINUM: Cardiomegaly. Calcified aorta. BONES AND SOFT TISSUES: Old left rib fractures. IMPRESSION: 1. No acute cardiopulmonary process identified. Electronically signed by: Norman Gatlin MD 07/29/2024 09:10 PM EST RP Workstation: [id number]   CT ABDOMEN PELVIS W CONTRAST Result Date: 07/29/2024 EXAM: CT ABDOMEN AND PELVIS WITH CONTRAST 07/29/2024 04:33:59 PM TECHNIQUE: CT of the abdomen and pelvis was performed with the administration of 100 mL of iohexol (OMNIPAQUE) 300 MG/ML solution. Multiplanar reformatted images are provided for review. Automated exposure control, iterative reconstruction, and/or weight-based adjustment of the mA/kV was utilized to reduce the radiation dose to as low as reasonably achievable. COMPARISON: 07/02/2024 CLINICAL HISTORY: Abdominal pain, acute, nonlocalized; increased drainage from abd wound, constipation, component of fecal impaction on exam. FINDINGS: LOWER CHEST: No acute abnormality. LIVER: The liver is unremarkable. GALLBLADDER AND BILE DUCTS: Gallbladder is unremarkable. No biliary ductal dilatation. SPLEEN: No acute abnormality. PANCREAS: No acute abnormality. ADRENAL GLANDS: No acute abnormality. KIDNEYS, URETERS AND BLADDER: No stones in the kidneys or ureters. No hydronephrosis. No perinephric or periureteral stranding. Urinary bladder is unremarkable. GI AND BOWEL: Status post right colectomy. Large amount of stool is seen in the sigmoid colon and rectum  concerning for impaction. Stomach demonstrates no acute abnormality. There is no bowel obstruction. PERITONEUM AND RETROPERITONEUM: No ascites. No free air. VASCULATURE: Aorta is normal in caliber. LYMPH NODES: No lymphadenopathy. REPRODUCTIVE ORGANS: Status post hysterectomy. BONES AND SOFT TISSUES: No acute osseous abnormality. No focal soft tissue abnormality. IMPRESSION: 1. Large amount of stool in the sigmoid colon and rectum, concerning for fecal impaction. Electronically signed by: Lynwood Seip MD 07/29/2024 05:19 PM EST RP Workstation: [id number]        Scheduled Meds:  amLODipine   10 mg Oral Daily   aspirin  EC  81 mg Oral Daily   docusate sodium  100 mg Oral BID   enoxaparin  (LOVENOX ) injection  40 mg Subcutaneous Q24H   irbesartan   300 mg Oral Daily   pravastatin   40 mg Oral Daily   Continuous Infusions:  cefTRIAXone (ROCEPHIN)  IV 1 g (07/30/24 2210)   metronidazole 500 mg (07/30/24  2053)   vancomycin 1,250 mg (07/30/24 2306)          Sophie Mao, MD [facility] 07/31/2024, 7:50 AM

## 2024-08-01 DIAGNOSIS — R6520 Severe sepsis without septic shock: Secondary | ICD-10-CM | POA: Diagnosis not present

## 2024-08-01 DIAGNOSIS — A419 Sepsis, unspecified organism: Secondary | ICD-10-CM | POA: Diagnosis not present

## 2024-08-01 LAB — BASIC METABOLIC PANEL WITH GFR
Anion gap: 13 (ref 5–15)
BUN: 6 mg/dL — ABNORMAL LOW (ref 8–23)
CO2: 23 mmol/L (ref 22–32)
Calcium: 8.9 mg/dL (ref 8.9–10.3)
Chloride: 102 mmol/L (ref 98–111)
Creatinine, Ser: 0.55 mg/dL (ref 0.44–1.00)
GFR, Estimated: >60 mL/min (ref >60)
Glucose, Bld: 114 mg/dL — ABNORMAL HIGH (ref 70–99)
Potassium: 3.1 mmol/L — ABNORMAL LOW (ref 3.5–5.1)
Sodium: 138 mmol/L (ref 135–145)

## 2024-08-01 LAB — CBC WITH DIFFERENTIAL/PLATELET
Abs Immature Granulocytes: 0.03 K/uL (ref 0.00–0.07)
Basophils Absolute: 0 K/uL (ref 0.0–0.1)
Basophils Relative: 1 %
Eosinophils Absolute: 0.2 K/uL (ref 0.0–0.5)
Eosinophils Relative: 2 %
HCT: 31.2 % — ABNORMAL LOW (ref 36.0–46.0)
Hemoglobin: 9.8 g/dL — ABNORMAL LOW (ref 12.0–15.0)
Immature Granulocytes: 1 %
Lymphocytes Relative: 22 %
Lymphs Abs: 1.4 K/uL (ref 0.7–4.0)
MCH: 24.5 pg — ABNORMAL LOW (ref 26.0–34.0)
MCHC: 31.4 g/dL (ref 30.0–36.0)
MCV: 78 fL — ABNORMAL LOW (ref 80.0–100.0)
Monocytes Absolute: 0.7 K/uL (ref 0.1–1.0)
Monocytes Relative: 11 %
Neutro Abs: 4.1 K/uL (ref 1.7–7.7)
Neutrophils Relative %: 63 %
Platelets: 260 K/uL (ref 150–400)
RBC: 4 MIL/uL (ref 3.87–5.11)
RDW: 26 % — ABNORMAL HIGH (ref 11.5–15.5)
WBC: 6.4 K/uL (ref 4.0–10.5)
nRBC: 0 % (ref 0.0–0.2)

## 2024-08-01 LAB — MAGNESIUM: Magnesium: 1.6 mg/dL — ABNORMAL LOW (ref 1.7–2.4)

## 2024-08-01 MED ORDER — POTASSIUM CHLORIDE CRYS ER 20 MEQ PO TBCR
40.0000 meq | EXTENDED_RELEASE_TABLET | ORAL | Status: AC
  Administered 2024-08-01 (×2): 40 meq via ORAL
  Filled 2024-08-01 (×2): qty 2

## 2024-08-01 MED ORDER — CEFADROXIL 500 MG PO CAPS
500.0000 mg | ORAL_CAPSULE | Freq: Two times a day (BID) | ORAL | Status: DC
  Administered 2024-08-01 – 2024-08-04 (×7): 500 mg via ORAL
  Filled 2024-08-01 (×8): qty 1

## 2024-08-01 MED ORDER — CARMEX CLASSIC LIP BALM EX OINT
TOPICAL_OINTMENT | CUTANEOUS | Status: DC | PRN
  Filled 2024-08-01: qty 10

## 2024-08-01 NOTE — Progress Notes (Signed)
 Daily Progress Note   Patient Name: Amy Andrade       Date: 08/01/2024 DOB: [birth year]  Age: 88 y.o. MRN#: [id number] Attending Physician: Cheryle Page, MD Primary Care Physician: [name], NP Admit Date: 07/29/2024  Reason for Consultation/Follow-up: Establishing goals of care  Subjective:    Length of Stay: 3  Current Medications: Scheduled Meds:   amLODipine   10 mg Oral Daily   aspirin  EC  81 mg Oral Daily   cefadroxil  500 mg Oral BID   docusate sodium  100 mg Oral BID   enoxaparin  (LOVENOX ) injection  40 mg Subcutaneous Q24H   irbesartan   300 mg Oral Daily   potassium chloride  40 mEq Oral Q4H   pravastatin   40 mg Oral Daily    Continuous Infusions:   PRN Meds: acetaminophen  **OR** acetaminophen , albuterol , bisacodyl, haloperidol lactate, ondansetron  **OR** ondansetron  (ZOFRAN ) IV, mouth rinse, polyethylene glycol, traZODone  Physical Exam          Vital Signs: BP (!) 164/62 (BP Location: Right Arm)   Pulse 67   Temp 97.7 F (36.5 C)   Resp 16   Ht 5' 10 (1.778 m)   Wt 73.6 kg   SpO2 98%   BMI 23.28 kg/m  SpO2: SpO2: 98 % O2 Device: O2 Device: Room Air O2 Flow Rate:    Intake/output summary:  Intake/Output Summary (Last 24 hours) at 08/01/2024 1247 Last data filed at 08/01/2024 0830 Gross per 24 hour  Intake 1175.4 ml  Output 1100 ml  Net 75.4 ml   LBM: Last BM Date : 07/30/24 Baseline Weight: Weight: 79.3 kg Most recent weight: Weight: 73.6 kg       Palliative Assessment/Data:      Patient Active Problem List   Diagnosis Date Noted   Severe sepsis (HCC) 07/29/2024   Anemia due to chronic blood loss 07/28/2024   Pernicious anemia 07/28/2024   Iron deficiency anemia, unspecified 07/10/2024   Anemia in neoplastic disease  07/10/2024   Hypo-osmolality and hyponatremia 07/10/2024   Abnormal gait 07/10/2024   Gastrointestinal hemorrhage 07/10/2024   Muscle weakness 07/10/2024   Postoperative care for cataract 07/10/2024   Primary malignant neoplasm of colon (HCC) 07/10/2024   Pulmonary embolism (HCC) 07/10/2024   Alzheimer's disease (HCC) 07/10/2024   Anemia 07/10/2024   Uncomplicated asthma 07/10/2024  Adenocarcinoma of colon (HCC) 07/08/2024   Symptomatic anemia 06/28/2024   Chronic right shoulder pain 06/09/2024   S/P TKR (total knee replacement) using cement, left 09/22/2020   Leukopenia 09/08/2020   Unspecified protein-calorie malnutrition 09/08/2020   Neurocognitive deficits 09/08/2020   Femur fracture, left (HCC) 09/01/2020   Closed fracture of left distal femur (HCC) 08/31/2020   Coronavirus infection 08/31/2020   Chronic left shoulder pain 04/25/2020   Chronic bilateral low back pain without sciatica 04/25/2020   Uncomplicated alcohol dependence (HCC) 04/25/2020   Chronic hyponatremia 05/31/2016   Incidental lung nodule 05/30/2016   History of pulmonary embolism 05/30/2016   Alzheimer disease (HCC)    Asthma    Hyperlipidemia    Hypertension    Skin lesion of chest wall 06/22/2011    Palliative Care Assessment & Plan   Patient Profile:    Assessment:  88 year old lady who underwent right hemicolectomy for ascending colon mass on October 16 admitted with small opening of her wound with mild erythema, general surgery consulted, placed on antibiotics and wound care orders have also been given. Of note, patient also to be followed by medical oncology, oncology has discussed with the patient's son [name]. As per [facility], patient is from home with family, after her surgery last month she was discharged to [facility] for rehab and had since returned home. Patient has a life-limiting illness of moderately differentiated adenocarcinoma.  Due to stage IIa disease, considering multiple  comorbidities dementia and functional status, surveillance alone per NCCN guidelines was being considered.  Dr. Autumn is her medical oncologist. Additional serious irreversible illness of Alzheimer's dementia.  Patient has underlying history of iron deficiency anemia and B12 deficiency. Palliative follow up continues  Recommendations/Plan:   Call placed and was able to reach son Syan Cullimore, we discussed about patient's current condition, patient lives at home with her son and daughter in law. We talked about her prognosis, surveillance has been recommended by oncology. We talked about home health care with palliative versus home hospice upon discharge. Patient's son will discuss with his wife, will request TOC follow up on 08-03-24 for further assistance with disposition arrangements.  Continue current mode of care.   Code Status:    Code Status Orders  (From admission, onward)           Start     Ordered   07/30/24 1003  Do not attempt resuscitation (DNR)- Limited -Do Not Intubate (DNI)  (Code Status)  Continuous       Question Answer Comment  If pulseless and not breathing No CPR or chest compressions.   In Pre-Arrest Conditions (Patient Is Breathing and Has A Pulse) Do not intubate. Provide all appropriate non-invasive medical interventions. Avoid ICU transfer unless indicated or required.   Consent: Discussion documented in EHR or advanced directives reviewed      07/30/24 1002           Code Status History     Date Active Date Inactive Code Status Order ID Comments User Context   07/29/2024 2145 07/30/2024 1002 Full Code [id number]  Zella Katha HERO, MD ED   06/28/2024 1943 07/10/2024 2110 Full Code [id number]  Tobie Jorie SAUNDERS, MD ED   08/31/2020 1908 09/05/2020 2100 Full Code [id number]  [name], MD ED   05/30/2016 2319 05/31/2016 2001 Full Code [id number]  [name], MD ED      Advance Directive Documentation    Flowsheet Row Most Recent Value   Type of Advance Directive  Healthcare Power of Attorney  Pre-existing out of facility DNR order (yellow form or pink MOST form) --  MOST Form in Place? --    Prognosis:  Unable to determine  Discharge Planning: To Be Determined  Care plan was discussed with  patient's son Madia Carvell.   Thank you for allowing the Palliative Medicine Team to assist in the care of this patient.  High MDM.      Greater than 50%  of this time was spent counseling and coordinating care related to the above assessment and plan.  Lonia Serve, MD  Please contact Palliative Medicine Team phone at [phone] for questions and concerns.

## 2024-08-01 NOTE — Progress Notes (Signed)
 PROGRESS NOTE    Amy Andrade  FMW:[id number] DOB: [birth year] DOA: 07/29/2024 PCP: [name], NP   Brief Narrative:  88 y.o. 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  She presented with increased confusion and discharged from abdominal incision.  On presentation, she was found to have severe sepsis possibly associated with cellulitis of her midline abdominal wound along with stool impaction requiring manual disimpaction in the ED and an enema.  She was started on IV fluids and antibiotics.  General surgery, oncology and palliative care consulted.  Assessment & Plan:   Severe sepsis: Present on admission Wound dehiscence with associated cellulitis - Presented with tachycardia, leukocytosis, lactic acidosis with possible cellulitis around her abdominal surgical site - Currently on broad-spectrum antibiotics.  Cultures negative so far.  General surgery recommended conservative management with antibiotics (recommended Keflex) with no need for surgical intervention and signed off on 07/30/2024.  Will switch antibiotics to cefadroxil today.  New diagnosis of adenocarcinoma of colon - Had colon resection during recent hospitalization last month.  Oncology evaluation appreciated: Recommended surveillance alone considering multiple comorbidities, dementia and functional status.  Constipation and stool impaction -Needed manual disimpaction and enema treatment in the ED resulting in subsequent large bowel movement.  Continue bowel regimen  Hypertension -Continue amlodipine  and irbesartan .  Blood pressure stable  Hyperlipidemia -Continue statin  Iron  deficiency anemia -In the setting of recent acute blood loss anemia.  Hemoglobin stable.  Monitor intermittently  Leukocytosis - Resolved  Hyponatremia - Resolved  Acute metabolic acidosis -Improved  Lactic acidosis -Improved  Hypokalemia - Replace.  Repeat a.m. labs  Goals of care -Overall prognosis is guarded to poor.   - Palliative care evaluation appreciated.  Palliative care is supposed to speak to son today.  Dementia Delirium -Monitor mental status.  Fall precaution.  Delirium precautions.  Physical deconditioning - PT recommended SNF.  Consult TOC  DVT prophylaxis: Lovenox  Code Status: DNR  family Communication: Son at bedside Disposition Plan: Status is: Inpatient Remains inpatient appropriate because: Of severity of illness    Consultants: General surgery/oncology/palliative care  Procedures: None  Antimicrobials: Rocephin, vancomycin from 07/29/2024 onwards   Subjective: Patient seen and examined at bedside.  No fever, vomiting, agitation reported.   Objective: Vitals:   07/31/24 0626 07/31/24 1228 07/31/24 1938 08/01/24 0438  BP: (!) 141/56 (!) 139/56 (!) 147/63 (!) 164/62  Pulse: 65 64 76 67  Resp: 20 17 20 16   Temp:  98.1 F (36.7 C) 98.1 F (36.7 C) 97.7 F (36.5 C)  TempSrc:  Oral    SpO2: 97% 94% 98% 98%  Weight:    73.6 kg  Height:        Intake/Output Summary (Last 24 hours) at 08/01/2024 0840 Last data filed at 08/01/2024 0600 Gross per 24 hour  Intake 935.4 ml  Output 1100 ml  Net -164.6 ml   Filed Weights   07/30/24 0141 07/31/24 0500 08/01/24 0438  Weight: 76.1 kg 76.3 kg 73.6 kg    Examination:  General: No acute distress.  Remains on room air.   ENT/neck: No JVD elevation or palpable neck masses noted  respiratory: Bilateral decreased breath sounds at bases with some scattered crackles  CVS: Rate mostly controlled; S1 and S2  are heard  abdominal: Soft, nontender, distended mildly; no organomegaly,  bowel sounds  are heard normally Extremities: No clubbing; mild lower extremity edema present CNS: Awake; answers some questions but still confused and slow to respond.  Poor historian.  No obvious focal deficits noted Lymph: No obvious palpable lymphadenopathy Skin: Abdominal wall dressing present  psych: Showing no signs of agitation.  Affect is flat.   Musculoskeletal: No obvious joint tenderness/erythema     Data Reviewed: I have personally reviewed following labs and imaging studies  CBC: Recent Labs  Lab 07/28/24 1125 07/29/24 1406 07/29/24 2019 07/30/24 0607 08/01/24 0516  WBC 8.8 13.2*  --  12.1* 6.4  NEUTROABS 6,908 11.0*  --   --  4.1  HGB 10.0* 10.7* 11.9* 10.4* 9.8*  HCT 32.8* 34.2* 35.0* 32.2* 31.2*  MCV 78.7* 78.3*  --  76.8* 78.0*  PLT 350 337  --  231 260   Basic Metabolic Panel: Recent Labs  Lab 07/28/24 1125 07/29/24 1406 07/29/24 2019 07/30/24 0607 08/01/24 0516  NA 138 133* 131* 134* 138  K 4.6 4.5 3.9 3.7 3.1*  CL 101 98 99 98 102  CO2 25 21*  --  22 23  GLUCOSE 110* 112* 104* 88 114*  BUN 14 16 12 11  6*  CREATININE 0.81 1.04* 0.90 0.85 0.55  CALCIUM 9.7 9.9  --  9.3 8.9  MG  --   --   --   --  1.6*   GFR: Estimated Creatinine Clearance: 54.6 mL/min (by C-G formula based on SCr of 0.55 mg/dL). Liver Function Tests: Recent Labs  Lab 07/28/24 1125 07/29/24 1406  AST 16 27  ALT 10 13  ALKPHOS  --  73  BILITOT 0.7 0.7  PROT 6.3 7.4  ALBUMIN  --  4.2   Recent Labs  Lab 07/29/24 1406  LIPASE 26   No results for input(s): AMMONIA in the last 168 hours. Coagulation Profile: No results for input(s): INR, PROTIME in the last 168 hours. Cardiac Enzymes: No results for input(s): CKTOTAL, CKMB, CKMBINDEX, TROPONINI in the last 168 hours. BNP (last 3 results) No results for input(s): PROBNP in the last 8760 hours. HbA1C: No results for input(s): HGBA1C in the last 72 hours. CBG: No results for input(s): GLUCAP in the last 168  hours. Lipid Profile: No results for input(s): CHOL, HDL, LDLCALC, TRIG, CHOLHDL, LDLDIRECT in the last 72 hours. Thyroid  Function Tests: No results for input(s): TSH, T4TOTAL, FREET4, T3FREE, THYROIDAB in the last 72 hours. Anemia Panel: No results for input(s): VITAMINB12, FOLATE, FERRITIN, TIBC, IRON, RETICCTPCT in the last 72 hours. Sepsis Labs: Recent Labs  Lab 07/29/24 2022 07/29/24 2223  LATICACIDVEN 2.5* 0.7    Recent Results (from the past 240 hours)  Urine Culture     Status: None   Collection Time: 07/29/24  2:17 PM   Specimen: Urine, Catheterized  Result Value Ref Range Status   Specimen Description   Final    URINE, CATHETERIZED Performed at [facility], [address], [place], [place] [address]    Special Requests   Final    NONE Performed at [facility], [address], [place], [place] [address]    Culture   Final    NO GROWTH Performed at [facility], [address], [place], [place] [address]    Report Status 07/30/2024 FINAL  Final  Blood culture (routine x 2)     Status: None (Preliminary result)   Collection Time:  07/29/24  9:45 PM   Specimen: BLOOD  Result Value Ref Range Status   Specimen Description   Final    BLOOD BLOOD RIGHT FOREARM Performed at [facility], [address] [place], [place] [address]    Special Requests   Final    Blood Culture results may not be optimal due to an inadequate volume of blood received in culture bottles BOTTLES DRAWN AEROBIC ONLY Performed at [facility], [address] [place], [place] [address]    Culture   Final    NO GROWTH 1 DAY Performed at [facility], [address], [place], [place] [address]    Report Status PENDING  Incomplete  Blood culture (routine x 2)     Status: None (Preliminary result)   Collection Time: 07/30/24  6:07 AM   Specimen: BLOOD  Result Value Ref  Range Status   Specimen Description   Final    BLOOD BLOOD RIGHT HAND Performed at [facility], [address] [place], [place] [address]    Special Requests   Final    BOTTLES DRAWN AEROBIC ONLY Blood Culture results may not be optimal due to an inadequate volume of blood received in culture bottles Performed at [facility], [address] [place], [place] [address]    Culture   Final    NO GROWTH < 24 HOURS Performed at [facility], [address], [place], [place] [address]    Report Status PENDING  Incomplete         Radiology Studies: No results found.       Scheduled Meds:  amLODipine   10 mg Oral Daily   aspirin  EC  81 mg Oral Daily   docusate sodium  100 mg Oral BID   enoxaparin  (LOVENOX ) injection  40 mg Subcutaneous Q24H   irbesartan   300 mg Oral Daily   pravastatin   40 mg Oral Daily   Continuous Infusions:  cefTRIAXone (ROCEPHIN)  IV 1 g (07/31/24 2031)   vancomycin 1,250 mg (07/31/24 2224)          Sophie Mao, MD [facility] 08/01/2024, 8:40 AM

## 2024-08-02 DIAGNOSIS — A419 Sepsis, unspecified organism: Secondary | ICD-10-CM | POA: Diagnosis not present

## 2024-08-02 DIAGNOSIS — R6520 Severe sepsis without septic shock: Secondary | ICD-10-CM | POA: Diagnosis not present

## 2024-08-02 LAB — CBC WITH DIFFERENTIAL/PLATELET
Abs Immature Granulocytes: 0.05 K/uL (ref 0.00–0.07)
Basophils Absolute: 0 K/uL (ref 0.0–0.1)
Basophils Relative: 0 %
Eosinophils Absolute: 0.2 K/uL (ref 0.0–0.5)
Eosinophils Relative: 3 %
HCT: 33 % — ABNORMAL LOW (ref 36.0–46.0)
Hemoglobin: 10.2 g/dL — ABNORMAL LOW (ref 12.0–15.0)
Immature Granulocytes: 1 %
Lymphocytes Relative: 27 %
Lymphs Abs: 1.4 K/uL (ref 0.7–4.0)
MCH: 24.2 pg — ABNORMAL LOW (ref 26.0–34.0)
MCHC: 30.9 g/dL (ref 30.0–36.0)
MCV: 78.2 fL — ABNORMAL LOW (ref 80.0–100.0)
Monocytes Absolute: 0.5 K/uL (ref 0.1–1.0)
Monocytes Relative: 10 %
Neutro Abs: 2.9 K/uL (ref 1.7–7.7)
Neutrophils Relative %: 59 %
Platelets: 278 K/uL (ref 150–400)
RBC: 4.22 MIL/uL (ref 3.87–5.11)
RDW: 26.3 % — ABNORMAL HIGH (ref 11.5–15.5)
Smear Review: NORMAL
WBC: 4.9 K/uL (ref 4.0–10.5)
nRBC: 0 % (ref 0.0–0.2)

## 2024-08-02 LAB — BASIC METABOLIC PANEL WITH GFR
Anion gap: 12 (ref 5–15)
BUN: <5 mg/dL — ABNORMAL LOW (ref 8–23)
CO2: 23 mmol/L (ref 22–32)
Calcium: 9.3 mg/dL (ref 8.9–10.3)
Chloride: 101 mmol/L (ref 98–111)
Creatinine, Ser: 0.52 mg/dL (ref 0.44–1.00)
GFR, Estimated: >60 mL/min (ref >60)
Glucose, Bld: 116 mg/dL — ABNORMAL HIGH (ref 70–99)
Potassium: 3.7 mmol/L (ref 3.5–5.1)
Sodium: 136 mmol/L (ref 135–145)

## 2024-08-02 LAB — MAGNESIUM: Magnesium: 1.6 mg/dL — ABNORMAL LOW (ref 1.7–2.4)

## 2024-08-02 MED ORDER — MAGNESIUM SULFATE 2 GM/50ML IV SOLN
2.0000 g | Freq: Once | INTRAVENOUS | Status: AC
  Administered 2024-08-02: 2 g via INTRAVENOUS
  Filled 2024-08-02: qty 50

## 2024-08-02 NOTE — Progress Notes (Signed)
 Mobility Specialist - Progress Note   08/02/24 1350  Mobility  Activity Pivoted/transferred from bed to chair  Level of Assistance Moderate assist, patient does 50-74%  Assistive Device Front wheel walker  Distance Ambulated (ft) 3 ft  Range of Motion/Exercises Active  Activity Response Tolerated well  Mobility visit 1 Mobility  Mobility Specialist Start Time (ACUTE ONLY) 1330  Mobility Specialist Stop Time (ACUTE ONLY) 1340  Mobility Specialist Time Calculation (min) (ACUTE ONLY) 10 min   Pt was found in bed and agreeable to mobilize. No complaints. Was left on recliner chair with all needs met. Call bell in reach and sitter at bedside.   Erminio Leos,  Mobility Specialist Can be reached via Secure Chat

## 2024-08-02 NOTE — Plan of Care (Signed)
  Problem: Clinical Measurements: Goal: Diagnostic test results will improve Outcome: Progressing   Problem: Activity: Goal: Risk for activity intolerance will decrease 08/02/2024 1332 by Celestia Recardo BROCKS, RN Outcome: Progressing 08/02/2024 0835 by Celestia Recardo BROCKS, RN Outcome: Progressing   Problem: Nutrition: Goal: Adequate nutrition will be maintained 08/02/2024 1332 by Celestia Recardo BROCKS, RN Outcome: Progressing 08/02/2024 0835 by Celestia Recardo BROCKS, RN Outcome: Progressing   Problem: Safety: Goal: Ability to remain free from injury will improve 08/02/2024 1332 by Celestia Recardo BROCKS, RN Outcome: Progressing 08/02/2024 0835 by Celestia Recardo BROCKS, RN Outcome: Progressing

## 2024-08-02 NOTE — Progress Notes (Addendum)
 PROGRESS NOTE    Amy Andrade  FMW:[id number] DOB: 01/15/1936 DOA: 07/29/2024 PCP: [name], NP   Brief Narrative:  88 y.o. 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  She presented with increased confusion and discharged from abdominal incision.  On presentation, she was found to have severe sepsis possibly associated with cellulitis of her midline abdominal wound along with stool impaction requiring manual disimpaction in the ED and an enema.  She was started on IV fluids and antibiotics.  General surgery, oncology and palliative care consulted.  Assessment & Plan:   Severe sepsis: Present on admission Wound dehiscence with associated cellulitis - Presented with tachycardia, leukocytosis, lactic acidosis with possible cellulitis around her abdominal surgical site - Treated with broad-spectrum antibiotics initially.  Cultures negative so far.  General surgery recommended conservative management with antibiotics (recommended Keflex) with no need for surgical intervention and signed off on 07/30/2024.  Antibiotics switched to cefadroxil from 08/01/2024 onwards.  New diagnosis of adenocarcinoma of colon - Had colon resection during recent hospitalization last month.  Oncology evaluation appreciated: Recommended surveillance alone considering multiple comorbidities, dementia and functional status.  Constipation and stool impaction -Needed manual disimpaction and enema treatment in the ED resulting in subsequent large bowel movement.  Continue bowel regimen  Hypertension -Continue amlodipine  and irbesartan .  Blood pressure  stable  Hyperlipidemia -Continue statin  Iron deficiency anemia -In the setting of recent acute blood loss anemia.  Hemoglobin stable.  Monitor intermittently  Leukocytosis - Resolved  Hyponatremia - Resolved  Acute metabolic acidosis -Improved  Lactic acidosis -Improved  Hypokalemia - Replace.  Repeat a.m. labs  Hypomagnesemia - Replace  Goals of care -Overall prognosis is guarded to poor.   - Palliative care evaluation following.  Dementia Delirium -Monitor mental status.  Fall precaution.  Delirium precautions.  Physical deconditioning - PT recommended SNF.  Consult TOC  DVT prophylaxis: Lovenox  Code Status: DNR  family Communication: Son and daughter-in-law at bedside Disposition Plan: Status is: Inpatient Remains inpatient appropriate because: Of severity of illness    Consultants: General surgery/oncology/palliative care  Procedures: None  Antimicrobials: Rocephin, vancomycin from 07/29/2024 onwards   Subjective: Patient seen and examined at bedside.  No agitation, fever, vomiting reported.  Oral intake is improving.   Objective: Vitals:   08/01/24 1348 08/01/24 2009 08/02/24 0600 08/02/24 0924  BP: 139/63 (!) 155/67 (!) 164/74 129/60  Pulse: 68 73 74 61  Resp: 18 12 20    Temp: 98.1 F (36.7 C) 98 F (36.7 C) (!) 97.4 F (36.3 C)   TempSrc: Oral Oral Oral   SpO2: 98% 98% 99%   Weight:      Height:        Intake/Output Summary (Last 24 hours) at 08/02/2024 1000 Last data filed at 08/02/2024 0900 Gross per 24 hour  Intake 1200 ml  Output 700 ml  Net 500 ml   Filed Weights   07/30/24 0141 07/31/24 0500 08/01/24 0438  Weight: 76.1 kg 76.3 kg 73.6 kg    Examination:  General: On room air currently and in no distress.  Chronically ill and deconditioned looking.   ENT/neck: No thyromegaly or elevated JVD noted  respiratory: Decreased breath sounds at bases bilaterally with scattered crackles CVS: S1-S2 heard; rate  currently  controlled  abdominal: Soft, nontender, distended slightly; no organomegaly, normal bowel sounds heard  extremities: Trace lower extremity edema present; no cyanosis  CNS: Alert; slightly confused.  No focal deficits noted  lymph: No lymphadenopathy palpable  skin: Abdominal wall has a dressing  psych: Flat affect.  Currently not agitated  musculoskeletal: No obvious joint swelling/deformity    Data Reviewed: I have personally reviewed following labs and imaging studies  CBC: Recent Labs  Lab 07/28/24 1125 07/29/24 1406 07/29/24 2019 07/30/24 0607 08/01/24 0516 08/02/24 0915  WBC 8.8 13.2*  --  12.1* 6.4 4.9  NEUTROABS 6,908 11.0*  --   --  4.1 2.9  HGB 10.0* 10.7* 11.9* 10.4* 9.8* 10.2*  HCT 32.8* 34.2* 35.0* 32.2* 31.2* 33.0*  MCV 78.7* 78.3*  --  76.8* 78.0* 78.2*  PLT 350 337  --  231 260 278   Basic Metabolic Panel: Recent Labs  Lab 07/28/24 1125 07/29/24 1406 07/29/24 2019 07/30/24 0607 08/01/24 0516 08/02/24 0915  NA 138 133* 131* 134* 138 136  K 4.6 4.5 3.9 3.7 3.1* 3.7  CL 101 98 99 98 102 101  CO2 25 21*  --  22 23 23   GLUCOSE 110* 112* 104* 88 114* 116*  BUN 14 16 12 11  6* <5*  CREATININE 0.81 1.04* 0.90 0.85 0.55 0.52  CALCIUM 9.7 9.9  --  9.3 8.9 9.3  MG  --   --   --   --  1.6* 1.6*   GFR: Estimated Creatinine Clearance: 54.6 mL/min (by C-G formula based on SCr of 0.52 mg/dL). Liver Function Tests: Recent Labs  Lab 07/28/24 1125 07/29/24 1406  AST 16 27  ALT 10 13  ALKPHOS  --  73  BILITOT 0.7 0.7  PROT 6.3 7.4  ALBUMIN  --  4.2   Recent Labs  Lab 07/29/24 1406  LIPASE 26   No results for input(s): AMMONIA in the last 168 hours. Coagulation Profile: No results for input(s): INR, PROTIME in the last 168 hours. Cardiac Enzymes: No results for input(s): CKTOTAL, CKMB, CKMBINDEX, TROPONINI in the last 168 hours. BNP (last 3 results) No results for input(s): PROBNP in the last 8760 hours. HbA1C: No results for  input(s): HGBA1C in the last 72 hours. CBG: No results for input(s): GLUCAP in the last 168 hours. Lipid Profile: No results for input(s): CHOL, HDL, LDLCALC, TRIG, CHOLHDL, LDLDIRECT in the last 72 hours. Thyroid  Function Tests: No results for input(s): TSH, T4TOTAL, FREET4, T3FREE, THYROIDAB in the last 72 hours. Anemia Panel: No results for input(s): VITAMINB12, FOLATE, FERRITIN, TIBC, IRON, RETICCTPCT in the last 72 hours. Sepsis Labs: Recent Labs  Lab 07/29/24 2022 07/29/24 2223  LATICACIDVEN 2.5* 0.7    Recent Results (from the past 240 hours)  Urine Culture     Status: None   Collection Time: 07/29/24  2:17 PM   Specimen: Urine, Catheterized  Result Value Ref Range Status   Specimen Description   Final    URINE, CATHETERIZED Performed at [facility], [address] [place], [place] [address]    Special Requests   Final    NONE Performed at [facility], [address], [place], [place] [address]    Culture   Final    NO GROWTH Performed at [facility], [address] [place], [place] [address]    Report Status 07/30/2024 FINAL  Final  Blood culture (routine x 2)     Status: None (Preliminary result)  Collection Time: 07/29/24  9:45 PM   Specimen: BLOOD  Result Value Ref Range Status   Specimen Description   Final    BLOOD BLOOD RIGHT FOREARM Performed at [facility], [address] [place], [place] [address]    Special Requests   Final    Blood Culture results may not be optimal due to an inadequate volume of blood received in culture bottles BOTTLES DRAWN AEROBIC ONLY Performed at [facility], [address], [place], [place] [address]    Culture   Final    NO GROWTH 3 DAYS Performed at [facility], [address], [place], [place] [address]    Report Status PENDING  Incomplete  Blood culture (routine x 2)     Status: None  (Preliminary result)   Collection Time: 07/30/24  6:07 AM   Specimen: BLOOD  Result Value Ref Range Status   Specimen Description   Final    BLOOD BLOOD RIGHT HAND Performed at [facility], [address], [place], [place] [address]    Special Requests   Final    BOTTLES DRAWN AEROBIC ONLY Blood Culture results may not be optimal due to an inadequate volume of blood received in culture bottles Performed at [facility], [address] [place], [place] [address]    Culture   Final    NO GROWTH 3 DAYS Performed at [facility], [address] [place], [place] [address]    Report Status PENDING  Incomplete         Radiology Studies: No results found.       Scheduled Meds:  amLODipine   10 mg Oral Daily   aspirin  EC  81 mg Oral Daily   cefadroxil  500 mg Oral BID   docusate sodium  100 mg Oral BID   enoxaparin  (LOVENOX ) injection  40 mg Subcutaneous Q24H   irbesartan   300 mg Oral Daily   pravastatin   40 mg Oral Daily   Continuous Infusions:          Sophie Mao, MD [facility] 08/02/2024, 10:00 AM

## 2024-08-02 NOTE — Progress Notes (Signed)
 Daily Progress Note   Patient Name: Amy Andrade       Date: 08/02/2024 DOB: 1936-07-06  Age: 88 y.o. MRN#: [id number] Attending Physician: Amy Page, MD Primary Care Physician: Amy Roxan BROCKS, NP Admit Date: 07/29/2024  Reason for Consultation/Follow-up: Establishing goals of care  Subjective:  Awake alert, able to take PO medications, PO intake reasonable.  Son and daughter in law at bedside.   Length of Stay: 4  Current Medications: Scheduled Meds:   amLODipine   10 mg Oral Daily   aspirin  EC  81 mg Oral Daily   cefadroxil  500 mg Oral BID   docusate sodium  100 mg Oral BID   enoxaparin  (LOVENOX ) injection  40 mg Subcutaneous Q24H   irbesartan   300 mg Oral Daily   pravastatin   40 mg Oral Daily    Continuous Infusions:  magnesium sulfate bolus IVPB      PRN Meds: acetaminophen  **OR** acetaminophen , albuterol , bisacodyl, haloperidol lactate, lip balm, ondansetron  **OR** ondansetron  (ZOFRAN ) IV, mouth rinse, polyethylene glycol, traZODone  Physical Exam         Chronically ill and deconditioned looking.  Awake alert Trace LE edema Answers few questions appropriately  Vital Signs: BP 129/60 (BP Location: Right Arm)   Pulse 61   Temp (!) 97.4 F (36.3 C) (Oral)   Resp 20   Ht 5' 10 (1.778 m)   Wt 73.6 kg   SpO2 99%   BMI 23.28 kg/m  SpO2: SpO2: 99 % O2 Device: O2 Device: Room Air O2 Flow Rate:    Intake/output summary:  Intake/Output Summary (Last 24 hours) at 08/02/2024 1210 Last data filed at 08/02/2024 0900 Gross per 24 hour  Intake 1200 ml  Output 700 ml  Net 500 ml   LBM: Last BM Date : 07/30/24 Baseline Weight: Weight: 79.3 kg Most recent weight: Weight: 73.6 kg       Palliative Assessment/Data:      Patient Active Problem List    Diagnosis Date Noted   Severe sepsis (HCC) 07/29/2024   Anemia due to chronic blood loss 07/28/2024   Pernicious anemia 07/28/2024   Iron deficiency anemia, unspecified 07/10/2024   Anemia in neoplastic disease 07/10/2024   Hypo-osmolality and hyponatremia 07/10/2024   Abnormal gait 07/10/2024   Gastrointestinal hemorrhage 07/10/2024   Muscle weakness 07/10/2024  Postoperative care for cataract 07/10/2024   Malignant neoplasm of colon (HCC) 07/10/2024   Pulmonary embolism (HCC) 07/10/2024   Alzheimer's disease (HCC) 07/10/2024   Anemia 07/10/2024   Uncomplicated asthma 07/10/2024   Adenocarcinoma of colon (HCC) 07/08/2024   Symptomatic anemia 06/28/2024   Chronic right shoulder pain 06/09/2024   S/P TKR (total knee replacement) using cement, left 09/22/2020   Leukopenia 09/08/2020   Unspecified protein-calorie malnutrition 09/08/2020   Neurocognitive deficits 09/08/2020   Femur fracture, left (HCC) 09/01/2020   Closed fracture of left distal femur (HCC) 08/31/2020   Coronavirus infection 08/31/2020   Chronic left shoulder pain 04/25/2020   Chronic bilateral low back pain without sciatica 04/25/2020   Uncomplicated alcohol dependence (HCC) 04/25/2020   Chronic hyponatremia 05/31/2016   Incidental lung nodule 05/30/2016   History of pulmonary embolism 05/30/2016   Alzheimer disease (HCC)    Asthma    Hyperlipidemia    Hypertension    Skin lesion of chest wall 06/22/2011    Palliative Care Assessment & Plan   Patient Profile:    Assessment:  87 year old lady who underwent right hemicolectomy for ascending colon mass on October 16 admitted with small opening of her wound with mild erythema, general surgery consulted, placed on antibiotics and wound care orders have also been given. Of note, patient also to be followed by medical oncology, oncology has discussed with the patient's son Amy Andrade. As per [facility] [place], patient is from home with family, after her surgery last month  she was discharged to [facility] for rehab and had since returned home. Patient has a life-limiting illness of moderately differentiated adenocarcinoma.  Due to stage IIa disease, considering multiple comorbidities dementia and functional status, surveillance alone per NCCN guidelines was being considered.  Dr. Autumn is her medical oncologist. Additional serious irreversible illness of Alzheimer's dementia.  Patient has underlying history of iron deficiency anemia and B12 deficiency. Palliative follow up continues    Recommendations/Plan: Re discussed home with hospice with son and with daughter in law present at bedside. Discussed about home with hospice and they are in agreement to proceed with home with hospice arrangements. Will request TOC assistance.     Code Status:    Code Status Orders  (From admission, onward)           Start     Ordered   07/30/24 1003  Do not attempt resuscitation (DNR)- Limited -Do Not Intubate (DNI)  (Code Status)  Continuous       Question Answer Comment  If pulseless and not breathing No CPR or chest compressions.   In Pre-Arrest Conditions (Patient Is Breathing and Has A Pulse) Do not intubate. Provide all appropriate non-invasive medical interventions. Avoid ICU transfer unless indicated or required.   Consent: Discussion documented in EHR or advanced directives reviewed      07/30/24 1002           Code Status History     Date Active Date Inactive Code Status Order ID Comments User Context   07/29/2024 2145 07/30/2024 1002 Full Code [id number]  Zella Katha HERO, MD ED   06/28/2024 1943 07/10/2024 2110 Full Code [id number]  Tobie Jorie SAUNDERS, MD ED   08/31/2020 1908 09/05/2020 2100 Full Code [id number]  [name], MD ED   05/30/2016 2319 05/31/2016 2001 Full Code [id number]  Opyd, Evalene RAMAN, MD ED      Advance Directive Documentation    Flowsheet Row Most Recent Value  Type of Advance Directive Healthcare Power of [place]  Pre-existing out of facility DNR order (yellow form or pink MOST form) --  MOST Form in Place? --    Prognosis:  < 6 months  Discharge Planning: Home with Hospice  Care plan was discussed with  patient, son, daughter in law and RN present in the room.   Thank you for allowing the Palliative Medicine Team to assist in the care of this patient.  High MDM.      Greater than 50%  of this time was spent counseling and coordinating care related to the above assessment and plan.  Lonia Serve, MD  Please contact Palliative Medicine Team phone at [phone] for questions and concerns.

## 2024-08-03 DIAGNOSIS — A419 Sepsis, unspecified organism: Secondary | ICD-10-CM | POA: Diagnosis not present

## 2024-08-03 DIAGNOSIS — R6520 Severe sepsis without septic shock: Secondary | ICD-10-CM | POA: Diagnosis not present

## 2024-08-03 NOTE — Progress Notes (Signed)
 PROGRESS NOTE    Amy Andrade  FMW:[id number] DOB: 06/16/1936 DOA: 07/29/2024 PCP: [name], NP   Brief Narrative:  88 y.o. 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  She presented with increased confusion and discharged from abdominal incision.  On presentation, she was found to have severe sepsis possibly associated with cellulitis of her midline abdominal wound along with stool impaction requiring manual disimpaction in the ED and an enema.  She was started on IV fluids and antibiotics.  General surgery, oncology and palliative care consulted.  Assessment & Plan:   Severe sepsis: Present on admission Wound dehiscence with associated cellulitis - Presented with tachycardia, leukocytosis, lactic acidosis with possible cellulitis around her abdominal surgical site - Treated with broad-spectrum antibiotics initially.  Cultures negative so far.  General surgery recommended conservative management with antibiotics (recommended Keflex) with no need for surgical intervention and signed off on 07/30/2024.  Antibiotics switched to cefadroxil from 08/01/2024 onwards.  New diagnosis of adenocarcinoma of colon - Had colon resection during recent hospitalization last month.  Oncology evaluation appreciated: Recommended surveillance alone considering multiple comorbidities, dementia and functional status.  Constipation and stool impaction -Needed manual disimpaction and enema treatment in the ED resulting in subsequent large bowel movement.  Continue bowel regimen  Hypertension -Continue amlodipine  and irbesartan .  Blood pressure  stable  Hyperlipidemia -Continue statin  Iron deficiency anemia -In the setting of recent acute blood loss anemia.  Hemoglobin stable.  Monitor intermittently  Leukocytosis - Resolved  Hyponatremia - Resolved  Acute metabolic acidosis -Improved  Lactic acidosis -Improved  Hypokalemia - Resolved  Hypomagnesemia - No labs today  Goals of care -Overall prognosis is guarded to poor.   - Palliative care evaluation following.  Family leaning towards home with hospice.  TOC has been consulted.  Dementia Delirium -Monitor mental status.  Fall precaution.  Delirium precautions.  Physical deconditioning - PT recommended SNF.  Discussion as above.  DVT prophylaxis: Lovenox  Code Status: DNR  family Communication: Son at bedside. Disposition Plan: Status is: Inpatient Remains inpatient appropriate because: Of severity of illness.  Possible discharge home with hospice tomorrow if arrangements can be made.    Consultants: General surgery/oncology/palliative care  Procedures: None  Antimicrobials: Rocephin, vancomycin from 07/29/2024 onwards   Subjective: Patient seen and examined at bedside.  No fever, vomiting, agitation reported.  Son at bedside thinks that she is slightly more slow to respond this morning.   Objective: Vitals:   08/02/24 1320 08/02/24 1939 08/03/24 0434 08/03/24 0435  BP: (!) 147/60 (!) 123/51 (!) 140/61   Pulse: 72 69 (!) 59   Resp: 18 18 18    Temp: 97.6 F (36.4 C) 97.9 F (36.6 C) 97.8 F (36.6 C)   TempSrc: Oral Oral Oral   SpO2: 97% 99% 100%   Weight:    72.8 kg  Height:        Intake/Output Summary (Last 24 hours) at 08/03/2024 1111 Last data filed at 08/03/2024 0900 Gross per 24 hour  Intake 570 ml  Output --  Net 570 ml   Filed Weights   07/31/24 0500 08/01/24 0438 08/03/24 0435  Weight: 76.3 kg 73.6 kg 72.8 kg    Examination:  General: No acute distress.  On room  air currently.  Chronically ill and deconditioned  looking.   ENT/neck: No JVD elevation or palpable neck masses noted respiratory: Bilateral decreased breath sounds at bases with scattered crackles  CVS: Rate mostly controlled; S1 and S2 are heard  abdominal: Soft, nontender, remains mildly distended; no organomegaly, bowel sounds are normally heard extremities: No clubbing; mild lower extremity edema present CNS: Awake; still slightly confused.  Poor historian.  No obvious focal deficits noted  lymph: No lymphadenopathy noted skin: Abdominal wall dressing present psych: Showing no signs of agitation.  Flat affect mostly musculoskeletal: No obvious joint tenderness/erythema   Data Reviewed: I have personally reviewed following labs and imaging studies  CBC: Recent Labs  Lab 07/28/24 1125 07/29/24 1406 07/29/24 2019 07/30/24 0607 08/01/24 0516 08/02/24 0915  WBC 8.8 13.2*  --  12.1* 6.4 4.9  NEUTROABS 6,908 11.0*  --   --  4.1 2.9  HGB 10.0* 10.7* 11.9* 10.4* 9.8* 10.2*  HCT 32.8* 34.2* 35.0* 32.2* 31.2* 33.0*  MCV 78.7* 78.3*  --  76.8* 78.0* 78.2*  PLT 350 337  --  231 260 278   Basic Metabolic Panel: Recent Labs  Lab 07/28/24 1125 07/29/24 1406 07/29/24 2019 07/30/24 0607 08/01/24 0516 08/02/24 0915  NA 138 133* 131* 134* 138 136  K 4.6 4.5 3.9 3.7 3.1* 3.7  CL 101 98 99 98 102 101  CO2 25 21*  --  22 23 23   GLUCOSE 110* 112* 104* 88 114* 116*  BUN 14 16 12 11  6* <5*  CREATININE 0.81 1.04* 0.90 0.85 0.55 0.52  CALCIUM 9.7 9.9  --  9.3 8.9 9.3  MG  --   --   --   --  1.6* 1.6*   GFR: Estimated Creatinine Clearance: 54.6 mL/min (by C-G formula based on SCr of 0.52 mg/dL). Liver Function Tests: Recent Labs  Lab 07/28/24 1125 07/29/24 1406  AST 16 27  ALT 10 13  ALKPHOS  --  73  BILITOT 0.7 0.7  PROT 6.3 7.4  ALBUMIN  --  4.2   Recent Labs  Lab 07/29/24 1406  LIPASE 26   No results for input(s): AMMONIA in the last 168 hours. Coagulation Profile: No results for input(s): INR, PROTIME in the  last 168 hours. Cardiac Enzymes: No results for input(s): CKTOTAL, CKMB, CKMBINDEX, TROPONINI in the last 168 hours. BNP (last 3 results) No results for input(s): PROBNP in the last 8760 hours. HbA1C: No results for input(s): HGBA1C in the last 72 hours. CBG: No results for input(s): GLUCAP in the last 168 hours. Lipid Profile: No results for input(s): CHOL, HDL, LDLCALC, TRIG, CHOLHDL, LDLDIRECT in the last 72 hours. Thyroid  Function Tests: No results for input(s): TSH, T4TOTAL, FREET4, T3FREE, THYROIDAB in the last 72 hours. Anemia Panel: No results for input(s): VITAMINB12, FOLATE, FERRITIN, TIBC, IRON, RETICCTPCT in the last 72 hours. Sepsis Labs: Recent Labs  Lab 07/29/24 2022 07/29/24 2223  LATICACIDVEN 2.5* 0.7    Recent Results (from the past 240 hours)  Urine Culture     Status: None   Collection Time: 07/29/24  2:17 PM   Specimen: Urine, Catheterized  Result Value Ref Range Status   Specimen Description   Final    URINE, CATHETERIZED Performed at [facility], [address] [place], [place] [address]    Special Requests   Final    NONE Performed at [facility], [address] [place], [place] [address]    Culture   Final  NO GROWTH Performed at [facility], [address] [place], [place] [address]    Report Status 07/30/2024 FINAL  Final  Blood culture (routine x 2)     Status: None (Preliminary result)   Collection Time: 07/29/24  9:45 PM   Specimen: BLOOD  Result Value Ref Range Status   Specimen Description   Final    BLOOD BLOOD RIGHT FOREARM Performed at [facility], [address], [place], [place] [address]    Special Requests   Final    Blood Culture results may not be optimal due to an inadequate volume of blood received in culture bottles BOTTLES DRAWN AEROBIC ONLY Performed at [facility], [address] [place], [place] [address]    Culture   Final    NO GROWTH 4 DAYS Performed at [facility], [address], [place], [place] [address]    Report Status PENDING  Incomplete  Blood culture (routine x 2)     Status: None (Preliminary result)   Collection Time: 07/30/24  6:07 AM   Specimen: BLOOD  Result Value Ref Range Status   Specimen Description   Final    BLOOD BLOOD RIGHT HAND Performed at [facility], [address], [place], [place] [address]    Special Requests   Final    BOTTLES DRAWN AEROBIC ONLY Blood Culture results may not be optimal due to an inadequate volume of blood received in culture bottles Performed at [facility], [address] [place], [place] [address]    Culture   Final    NO GROWTH 4 DAYS Performed at [facility], [address], [place], [place] [address]    Report Status PENDING  Incomplete         Radiology Studies: No results found.       Scheduled Meds:  amLODipine   10 mg Oral Daily   aspirin  EC  81 mg Oral Daily   cefadroxil  500 mg Oral BID   docusate sodium  100 mg Oral BID   enoxaparin  (LOVENOX ) injection  40 mg Subcutaneous Q24H   irbesartan   300 mg Oral Daily   pravastatin   40 mg Oral Daily   Continuous Infusions:          Sophie Mao, MD [facility] 08/03/2024, 11:11 AM

## 2024-08-03 NOTE — Progress Notes (Signed)
 Mobility Specialist - Progress Note   08/03/24 1127  Mobility  Activity Pivoted/transferred to/from [facility]  Level of Assistance +2 (takes two people)  Musician  Range of Motion/Exercises Active  Activity Response Tolerated well  Mobility visit 1 Mobility  Mobility Specialist Start Time (ACUTE ONLY) 1100  Mobility Specialist Stop Time (ACUTE ONLY) 1110  Mobility Specialist Time Calculation (min) (ACUTE ONLY) 10 min   NT requested assistance to transfer pt from [facility] to recliner chair. Pt able to follow cues and at EOS was left on recliner chair with NT in room.   Erminio Leos,  Mobility Specialist Can be reached via Secure Chat

## 2024-08-03 NOTE — Progress Notes (Signed)
 Physical Therapy Treatment and discharge  Patient Details Name: Amy Andrade MRN: [id number] DOB: 1936/05/05 Today's Date: 08/03/2024   History of Present Illness 88 yo female presents to therapy following hospital admission on 07/29/2024 due to c/o constipation and drainage from from abdominal wound. Pt presented to ED 06/28/24 with weakness, anemia, FOBT, severe, B12 deficiency and required 1 unit PRBC on 10/12, EGD, hiatal hernia and lesions and colonoscopy 10/15 notable for non-obstructing colon mass, pt underwent laparoscopic assisted right colectomy on 07/02/2024 due to mass. Pt PMH includes but is not limited to: Alzheimer dementia, asthma, chronic pain, HTN.chronic pain due to OA of both shoulders and chronic left shoulder dislocation    PT Comments   Pt admitted with above diagnosis.  Pt currently with functional limitations due to the deficits listed below (see PT Problem List). Pt seated in recliner when PT arrived. Pt agreeable to therapy intervention and reported she was ready to move. Pt required cues, increased time and mod A for sit to stand  from recliner, once in standing min A with RW for SPT to BSC, mod A for sit to stand  from [facility], pt able to progress with gait assessment today with RW, CGA and cues for posture, safety and RW management 12 feet in personal room, limited foot clearnace short stride length and slow cadence with flexed trunk and forward head with cervical flexion pt unable to modify posture with gait nor once seated EOB pt stated my head just stays that way, pt required min A for B LE for sit to supine. Pt left in bed, all needs in place. Pt is to transition home with hospice care and family support on 11/18. No further PT intervention warranted at this time.  Thank you for this referral.    If plan is discharge home, recommend the following: Help with stairs or ramp for entrance;Assist for transportation;Direct supervision/assist for financial management;A lot of  help with bathing/dressing/bathroom;Two people to help with walking and/or transfers   Can travel by private vehicle     No  Equipment Recommendations  Other (comment) (TBD in next venue)    Recommendations for Other Services       Precautions / Restrictions Precautions Precautions: Fall Recall of Precautions/Restrictions: Impaired Precaution/Restrictions Comments: severe  bilateral shoulder AROM impaired, 0-60 degrees FF; significant crepitus noted with R shoulder movement Restrictions Weight Bearing Restrictions Per Provider Order: No     Mobility  Bed Mobility Overal bed mobility: Needs Assistance Bed Mobility: Sit to Supine       Sit to supine: Min assist   General bed mobility comments: min A for B LE for sit to supine to flat bed, pt able to initiate scooting toward [facility] with bridge technique    Transfers Overall transfer level: Needs assistance Equipment used: Rolling walker (2 wheels) Transfers: Sit to/from Stand, Bed to chair/wheelchair/BSC Sit to Stand: Mod assist   Step pivot transfers: Min assist, Mod assist       General transfer comment: min cues for proper UE and AD placement recliner to [facility] transfer    Ambulation/Gait Ambulation/Gait assistance: Contact guard assist Gait Distance (Feet): 12 Feet Assistive device: Rolling walker (2 wheels) Gait Pattern/deviations: Step-to pattern, Trunk flexed Gait velocity: decreased     General Gait Details: min cues for posture with pt exhibiting kyphosis and head forward with cervical flexion and unable to modify posture, short stride length with limited foot clearance, no overt LOB   Stairs  Wheelchair Mobility     Tilt Bed    Modified Rankin (Stroke Patients Only)       Balance Overall balance assessment: Needs assistance Sitting-balance support: Bilateral upper extremity supported, Feet supported Sitting balance-Leahy Scale: Fair Sitting balance - Comments: flexed posture  and cues for maintaining B UE support on floor   Standing balance support: Bilateral upper extremity supported, During functional activity, Reliant on assistive device for balance Standing balance-Leahy Scale: Poor Standing balance comment: CGA with B UE support at Rw                            Communication Communication Communication: Impaired Factors Affecting Communication: Difficulty expressing self  Cognition Arousal: Alert Behavior During Therapy: WFL for tasks assessed/performed, Anxious   PT - Cognitive impairments: History of cognitive impairments, No family/caregiver present to determine baseline                       PT - Cognition Comments: improved recall for safety and use of call bell today Following commands: Intact Following commands impaired: Follows one step commands with increased time    Cueing Cueing Techniques: Gestural cues, Tactile cues, Verbal cues  Exercises      General Comments        Pertinent Vitals/Pain Pain Assessment Pain Assessment: Faces Faces Pain Scale: Hurts a little bit Breathing: occasional labored breathing, short period of hyperventilation Negative Vocalization: occasional moan/groan, low speech, negative/disapproving quality Facial Expression: sad, frightened, frown Body Language: tense, distressed pacing, fidgeting Consolability: no need to console PAINAD Score: 4 Pain Location: abdomen, B UE shoulders Pain Descriptors / Indicators: Constant, Discomfort, Guarding Pain Intervention(s): Limited activity within patient's tolerance, Monitored during session, Repositioned    Home Living                          Prior Function            PT Goals (current goals can now be found in the care plan section) Acute Rehab PT Goals Patient Stated Goal: patient to be able to stand and transfer PT Goal Formulation: Patient unable to participate in goal setting Time For Goal Achievement:  08/17/24 Potential to Achieve Goals: Good Progress towards PT goals: Progressing toward goals    Frequency    Min 2X/week      PT Plan      Co-evaluation              AM-PAC PT 6 Clicks Mobility   Outcome Measure  Help needed turning from your back to your side while in a flat bed without using bedrails?: A Lot Help needed moving from lying on your back to sitting on the side of a flat bed without using bedrails?: A Lot Help needed moving to and from a bed to a chair (including a wheelchair)?: A Lot Help needed standing up from a chair using your arms (e.g., wheelchair or bedside chair)?: A Lot Help needed to walk in hospital room?: A Little Help needed climbing 3-5 steps with a railing? : Total 6 Click Score: 12    End of Session Equipment Utilized During Treatment: Gait belt Activity Tolerance: Patient tolerated treatment well Patient left: in bed;with bed alarm set;with call bell/phone within reach Nurse Communication: Mobility status PT Visit Diagnosis: Unsteadiness on feet (R26.81);Pain;Difficulty in walking, not elsewhere classified (R26.2);Muscle weakness (generalized) (M62.81) Pain - Right/Left:  (B) Pain -  part of body: Shoulder (abdomen)     Time: 8660-8596 PT Time Calculation (min) (ACUTE ONLY): 24 min  Charges:    $Therapeutic Activity: 8-22 mins PT General Charges $$ ACUTE PT VISIT: 1 Visit                     [place], PT Acute Rehab    [place] VEAR Drone 08/03/2024, 2:14 PM

## 2024-08-03 NOTE — Progress Notes (Signed)
 Daily Progress Note   Patient Name: Amy Andrade       Date: 08/03/2024 DOB: Feb 11, 1936  Age: 88 y.o. MRN#: [id number] Attending Physician: Amy Page, MD Primary Care Physician: Amy Roxan BROCKS, NP Admit Date: 07/29/2024  Reason for Consultation/Follow-up: Establishing goals of care  Subjective:  Awake alert,no distress, mobility specialist note reviewed.   Length of Stay: 5  Current Medications: Scheduled Meds:   amLODipine   10 mg Oral Daily   aspirin  EC  81 mg Oral Daily   cefadroxil  500 mg Oral BID   docusate sodium  100 mg Oral BID   enoxaparin  (LOVENOX ) injection  40 mg Subcutaneous Q24H   irbesartan   300 mg Oral Daily   pravastatin   40 mg Oral Daily    Continuous Infusions:    PRN Meds: acetaminophen  **OR** acetaminophen , albuterol , bisacodyl, haloperidol lactate, lip balm, ondansetron  **OR** ondansetron  (ZOFRAN ) IV, mouth rinse, polyethylene glycol, traZODone  Physical Exam         Chronically ill and deconditioned looking.  Awake alert Trace LE edema Answers few questions appropriately  Vital Signs: BP (!) 140/61 (BP Location: Right Arm)   Pulse (!) 59   Temp 97.8 F (36.6 C) (Oral)   Resp 18   Ht 5' 10 (1.778 m)   Wt 72.8 kg   SpO2 100%   BMI 23.03 kg/m  SpO2: SpO2: 100 % O2 Device: O2 Device: Room Air O2 Flow Rate:    Intake/output summary:  Intake/Output Summary (Last 24 hours) at 08/03/2024 1033 Last data filed at 08/03/2024 0900 Gross per 24 hour  Intake 570 ml  Output --  Net 570 ml   LBM: Last BM Date : 08/02/24 Baseline Weight: Weight: 79.3 kg Most recent weight: Weight: 72.8 kg       Palliative Assessment/Data:      Patient Active Problem List   Diagnosis Date Noted   Severe sepsis (HCC) 07/29/2024   Anemia due to  chronic blood loss 07/28/2024   Pernicious anemia 07/28/2024   Iron deficiency anemia, unspecified 07/10/2024   Anemia in neoplastic disease 07/10/2024   Hypo-osmolality and hyponatremia 07/10/2024   Abnormal gait 07/10/2024   Gastrointestinal hemorrhage 07/10/2024   Muscle weakness 07/10/2024   Postoperative care for cataract 07/10/2024   Malignant neoplasm of colon (HCC) 07/10/2024   Pulmonary embolism (HCC)  07/10/2024   Alzheimer's disease (HCC) 07/10/2024   Anemia 07/10/2024   Uncomplicated asthma 07/10/2024   Adenocarcinoma of colon (HCC) 07/08/2024   Symptomatic anemia 06/28/2024   Chronic right shoulder pain 06/09/2024   S/P TKR (total knee replacement) using cement, left 09/22/2020   Leukopenia 09/08/2020   Unspecified protein-calorie malnutrition 09/08/2020   Neurocognitive deficits 09/08/2020   Femur fracture, left (HCC) 09/01/2020   Closed fracture of left distal femur (HCC) 08/31/2020   Coronavirus infection 08/31/2020   Chronic left shoulder pain 04/25/2020   Chronic bilateral low back pain without sciatica 04/25/2020   Uncomplicated alcohol dependence (HCC) 04/25/2020   Chronic hyponatremia 05/31/2016   Incidental lung nodule 05/30/2016   History of pulmonary embolism 05/30/2016   Alzheimer disease (HCC)    Asthma    Hyperlipidemia    Hypertension    Skin lesion of chest wall 06/22/2011    Palliative Care Assessment & Plan   Patient Profile:    Assessment:  88 year old lady who underwent right hemicolectomy for ascending colon mass on October 16 admitted with small opening of her wound with mild erythema, general surgery consulted, placed on antibiotics and wound care orders have also been given. Of note, patient also to be followed by medical oncology, oncology has discussed with the patient's son [name]. As per [facility], patient is from home with family, after her surgery last month she was discharged to [facility] for rehab and had since returned  home. Patient has a life-limiting illness of moderately differentiated adenocarcinoma.  Due to stage IIa disease, considering multiple comorbidities dementia and functional status, surveillance alone per NCCN guidelines was being considered.  Dr. Autumn is her medical oncologist. Additional serious irreversible illness of Alzheimer's dementia.  Patient has underlying history of iron deficiency anemia and B12 deficiency. Palliative follow up continues    Recommendations/Plan: 11-16: Re discussed home with hospice with son and with daughter in law present at bedside. Discussed about home with hospice and they are in agreement to proceed with home with hospice arrangements. Will request TOC assistance.    11-17: TOC consulted by primary service, medication history noted, continue current mode of care. Recommend home with hospice.   Code Status:    Code Status Orders  (From admission, onward)           Start     Ordered   07/30/24 1003  Do not attempt resuscitation (DNR)- Limited -Do Not Intubate (DNI)  (Code Status)  Continuous       Question Answer Comment  If pulseless and not breathing No CPR or chest compressions.   In Pre-Arrest Conditions (Patient Is Breathing and Has A Pulse) Do not intubate. Provide all appropriate non-invasive medical interventions. Avoid ICU transfer unless indicated or required.   Consent: Discussion documented in EHR or advanced directives reviewed      07/30/24 1002           Code Status History     Date Active Date Inactive Code Status Order ID Comments User Context   07/29/2024 2145 07/30/2024 1002 Full Code [id number]  Zella Katha HERO, MD ED   06/28/2024 1943 07/10/2024 2110 Full Code [id number]  Tobie Jorie SAUNDERS, MD ED   08/31/2020 1908 09/05/2020 2100 Full Code [id number]  [name], MD ED   05/30/2016 2319 05/31/2016 2001 Full Code [id number]  Opyd, Evalene RAMAN, MD ED      Advance Directive Documentation    Flowsheet Row Most Recent  Value  Type of Advance Directive Healthcare  Power of Attorney  Pre-existing out of facility DNR order (yellow form or pink MOST form) --  MOST Form in Place? --    Prognosis:  < 6 months  Discharge Planning: Home with Hospice  Care plan was discussed with IDT  Thank you for allowing the Palliative Medicine Team to assist in the care of this patient.  mod MDM.      Greater than 50%  of this time was spent counseling and coordinating care related to the above assessment and plan.  Lonia Serve, MD  Please contact Palliative Medicine Team phone at [phone] for questions and concerns.

## 2024-08-03 NOTE — TOC Progression Note (Signed)
 Transition of Care [facility]) - Progression Note   Patient Details  Name: Amy Andrade MRN: [id number] Date of Birth: 1935/12/15  [facility]) CM/SW Contact  Duwaine GORMAN Aran, LCSW Phone Number: 08/03/2024, 2:01 PM  Clinical Narrative: CSW consulted for home hospice. CSW spoke with son, [name], regarding consult. Son agreeable to referral to [facility] as family lives in [place] and it would be easier to visit her at [facility] should she meet criteria in the coming months. Son confirmed the patient already has a hospital bed at home. CSW made home hospice referral to [place] with [facility]. Care management to follow.  Expected Discharge Plan: Home w Hospice Care Barriers to Discharge: Continued Medical Work up  Expected Discharge Plan and Services In-house Referral: Clinical Social Work, Hospice / Palliative Care Post Acute Care Choice: Hospice Living arrangements for the past 2 months: Single Family Home          DME Arranged: N/A DME Agency: NA  Social Drivers of Health (SDOH) Interventions SDOH Screenings   Food Insecurity: No Food Insecurity (07/30/2024)  Housing: Low Risk  (07/30/2024)  Transportation Needs: No Transportation Needs (07/30/2024)  Utilities: Not At Risk (07/30/2024)  Depression (PHQ2-9): Low Risk  (07/28/2024)  Financial Resource Strain: Low Risk  (11/18/2017)  Physical Activity: Inactive (11/18/2017)  Social Connections: Socially Isolated (07/30/2024)  Stress: No Stress Concern Present (11/18/2017)  Tobacco Use: Medium Risk (07/29/2024)   Readmission Risk Interventions    07/30/2024    8:43 AM 07/10/2024   10:46 AM  Readmission Risk Prevention Plan  Transportation Screening Complete Complete  PCP or Specialist Appt within 5-7 Days  Complete  Home Care Screening  Complete  Medication Review (RN CM)  Complete  HRI or Home Care Consult Complete   Social Work Consult for Recovery Care Planning/Counseling Complete   Palliative Care  Screening Not Applicable   Medication Review Oceanographer) Complete

## 2024-08-03 NOTE — Progress Notes (Signed)
 Amy Andrade [address] [facility] Liaison Note  Received request from Megan,Transitions of Care Manager, for hospice services at home after discharge. Met with patient and her son Amy Andrade to initiate education related to hospice philosophy, services, and team approach to care. Patient/family verbalized understanding of information given. Per discussion, the plan is for discharge home by private vehicle 11/18.   DME needs discussed. Patient has a hospital bed, wheelchair, walker, bedside commode and lift chair in the home. No other equipment is needed at this time. The address has been verified and is correct in the chart. Amy Andrade is the family contact to arrange time of equipment delivery.  Please send signed and completed DNR home with patient/family. Please provide prescriptions at discharge as needed to ensure ongoing symptom management.  [facility] information and contact numbers given to [place]. Above information shared with Duwaine, Transitions of Care Manager. Please call with any questions or concerns.  T Thank you for the opportunity to participate in this patient's care.   Amy Darien BSN, RN [facility] Liaison [phone]

## 2024-08-04 DIAGNOSIS — A419 Sepsis, unspecified organism: Secondary | ICD-10-CM | POA: Diagnosis not present

## 2024-08-04 DIAGNOSIS — R6520 Severe sepsis without septic shock: Secondary | ICD-10-CM | POA: Diagnosis not present

## 2024-08-04 LAB — CULTURE, BLOOD (ROUTINE X 2)
Culture: NO GROWTH
Culture: NO GROWTH

## 2024-08-04 MED ORDER — POLYETHYLENE GLYCOL 3350 17 G PO PACK: 17.0000 g | PACK | Freq: Every day | ORAL | 0 refills | Status: AC | PRN

## 2024-08-04 NOTE — TOC Transition Note (Signed)
 Transition of Care [facility]) - Discharge Note  Patient Details  Name: Amy Andrade MRN: [id number] Date of Birth: 11/07/1935  Transition of Care [facility]) CM/SW Contact:  Duwaine GORMAN Aran, LCSW Phone Number: 08/04/2024, 10:03 AM  Clinical Narrative: Patient will discharge home today with hospice through [facility]. Family to provide transportation. Care management signing off.  Final next level of care: Home w Hospice Care Barriers to Discharge: Barriers Resolved  Patient Goals and CMS Choice Patient states their goals for this hospitalization and ongoing recovery are:: Home with hospice CMS Medicare.gov Compare Post Acute Care list provided to:: Patient Represenative (must comment) Choice offered to / list presented to : Adult Children  Discharge Plan and Services Additional resources added to the After Visit Summary for   In-house Referral: Clinical Social Work, Hospice / Palliative Care Post Acute Care Choice: Hospice          DME Arranged: N/A DME Agency: NA  Social Drivers of Health (SDOH) Interventions SDOH Screenings   Food Insecurity: No Food Insecurity (07/30/2024)  Housing: Low Risk  (07/30/2024)  Transportation Needs: No Transportation Needs (07/30/2024)  Utilities: Not At Risk (07/30/2024)  Depression (PHQ2-9): Low Risk  (07/28/2024)  Financial Resource Strain: Low Risk  (11/18/2017)  Physical Activity: Inactive (11/18/2017)  Social Connections: Socially Isolated (07/30/2024)  Stress: No Stress Concern Present (11/18/2017)  Tobacco Use: Medium Risk (07/29/2024)   Readmission Risk Interventions    07/30/2024    8:43 AM 07/10/2024   10:46 AM  Readmission Risk Prevention Plan  Transportation Screening Complete Complete  PCP or Specialist Appt within 5-7 Days  Complete  Home Care Screening  Complete  Medication Review (RN CM)  Complete  HRI or Home Care Consult Complete   Social Work Consult for Recovery Care Planning/Counseling Complete   Palliative Care Screening Not  Applicable   Medication Review Oceanographer) Complete

## 2024-08-04 NOTE — Discharge Summary (Signed)
 Physician Discharge Summary  ARIEN MORINE FMW:[id number] DOB: 1936-07-31 DOA: 07/29/2024  PCP: [name], NP  Admit date: 07/29/2024 Discharge date: 08/04/2024  Admitted From: Home Disposition: Home with hospice  Recommendations for Outpatient Follow-up:  Follow up with PCP in [redacted] week along with home hospice at earliest convenience    Home Health: No Equipment/Devices: None  Discharge Condition: Guarded  CODE STATUS: DNR Diet recommendation: Regular  Brief/Interim Summary: 88 y.o. 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  She presented with increased confusion and discharged from abdominal incision.  On presentation, she was found to have severe sepsis possibly associated with cellulitis of her midline abdominal wound along with stool impaction requiring manual disimpaction in the ED and an enema.  She was started on IV fluids and antibiotics.  General surgery, oncology and palliative care consulted.  During the hospitalization, her condition has improved.  General surgery recommended conservative management and no need for surgical intervention and signed off on 07/30/2024.  Oncology recommended surveillance alone.  Palliative care has been following the patient.  Family has decided to pursue home hospice.  She has been switched to oral antibiotics already and received 7 days of antibiotics.  No need for any more antibiotics on discharge.  She will be discharged to home with hospice today if arrangements get made.  Discharge Diagnoses:   Severe sepsis: Present on admission; resolved Wound dehiscence with associated cellulitis - Presented  with tachycardia, leukocytosis, lactic acidosis with possible cellulitis around her abdominal surgical site - Treated with broad-spectrum antibiotics initially.  Cultures negative so far.  General surgery recommended conservative management with antibiotics (recommended Keflex) with no need for surgical intervention and signed off on 07/30/2024.  Antibiotics switched to cefadroxil from 08/01/2024 onwards.  She has already completed 7 days of antibiotics: No need for any more antibiotics -Discharge patient home today with hospice.   New diagnosis of adenocarcinoma of colon - Had colon resection during recent hospitalization last month.  Oncology evaluation appreciated: Recommended surveillance alone considering multiple comorbidities, dementia and functional status.   Constipation and stool impaction -Needed manual disimpaction and enema treatment in the ED resulting in subsequent large bowel movement.  Continue bowel regimen   Hypertension -Continue amlodipine  and irbesartan .  Blood pressure stable   Hyperlipidemia -Continue statin   Iron deficiency anemia -In the setting of recent acute blood loss anemia.  Hemoglobin stable.     Leukocytosis - Resolved   Hyponatremia - Resolved   Acute metabolic acidosis -Improved   Lactic acidosis -Improved   Hypokalemia - Resolved   Hypomagnesemia - No labs today   Goals of care -Overall prognosis is guarded to poor.   - Palliative care following.  Family has decided to pursue home hospice.     Dementia Delirium - Mental status currently remains stable.  Outpatient.   Physical deconditioning - PT recommended SNF.  Discussion as above.   Discharge Instructions  Discharge Instructions     Diet general   Complete by: As directed    Discharge wound care:   Complete by: As directed    As per general surgery recommendations   Increase activity slowly   Complete by: As directed       Allergies as of 08/04/2024  No Known  Allergies      Medication List     STOP taking these medications    doxycycline 100 MG tablet Commonly known as: VIBRA-TABS   HYDROcodone -acetaminophen  5-325 MG tablet Commonly known as: NORCO/VICODIN   nystatin  cream Commonly known as: MYCOSTATIN        TAKE these medications    amLODipine  10 MG tablet Commonly known as: NORVASC  Take 1 tablet (10 mg total) by mouth daily. What changed: when to take this   aspirin  81 MG tablet Take 81 mg by mouth in the morning.   CALCIUM-VITAMIN D PO Take 1 tablet by mouth 2 (two) times daily.   irbesartan  300 MG tablet Commonly known as: AVAPRO  Take 1 tablet (300 mg total) by mouth daily. What changed: when to take this   methocarbamol  500 MG tablet Commonly known as: ROBAXIN  Take 1 tablet by mouth once daily What changed: when to take this   nitroGLYCERIN  0.4 MG SL tablet Commonly known as: NITROSTAT  Place 1 tablet (0.4 mg total) under the tongue every 5 (five) minutes x 3 doses as needed for chest pain.   polyethylene glycol 17 g packet Commonly known as: MIRALAX  / GLYCOLAX  Take 17 g by mouth daily as needed for mild constipation.   pravastatin  40 MG tablet Commonly known as: PRAVACHOL  Take 1 tablet (40 mg total) by mouth daily. What changed: when to take this               Discharge Care Instructions  (From admission, onward)           Start     Ordered   08/04/24 0000  Discharge wound care:       Comments: As per general surgery recommendations   08/04/24 0959            Follow-up Information     Polly Cordella LABOR, MD Follow up on 08/17/2024.   Specialty: General Surgery Why: 10am, Arrive 30 minutes prior to your appointment time, Please bring your insurance card and photo ID Contact information: [address] [place] [address] [phone]         [facility] Follow up.   Specialty: Hospice and Palliative Medicine Why: [facility] to provide home  hospice services. Contact information: [address] [place]  [address] [phone]        Ngetich, Roxan BROCKS, NP. Schedule an appointment as soon as possible for a visit in 1 week(s).   Specialty: Family Medicine Contact information: [address] [place] [address] [phone]                No Known Allergies  Consultations: General surgery/oncology/palliative care   Procedures/Studies: DG Chest Port 1 View Result Date: 07/29/2024 EXAM: 1 VIEW XRAY OF THE CHEST 07/29/2024 08:50:00 PM COMPARISON: CT 10 / 16 / 25 CLINICAL HISTORY: fever FINDINGS: LUNGS AND PLEURA: No focal pulmonary opacity. No pleural effusion. No pneumothorax. HEART AND MEDIASTINUM: Cardiomegaly. Calcified aorta. BONES AND SOFT TISSUES: Old left rib fractures. IMPRESSION: 1. No acute cardiopulmonary process identified. Electronically signed by: Norman Gatlin MD 07/29/2024 09:10 PM EST RP Workstation: [id number]   CT ABDOMEN PELVIS W CONTRAST Result Date: 07/29/2024 EXAM: CT ABDOMEN AND PELVIS WITH CONTRAST 07/29/2024 04:33:59 PM TECHNIQUE: CT of the abdomen and pelvis was performed with the administration of 100 mL of iohexol (OMNIPAQUE) 300 MG/ML solution. Multiplanar reformatted images are provided for review. Automated exposure control, iterative reconstruction, and/or weight-based adjustment of the mA/kV was utilized to  reduce the radiation dose to as low as reasonably achievable. COMPARISON: 07/02/2024 CLINICAL HISTORY: Abdominal pain, acute, nonlocalized; increased drainage from abd wound, constipation, component of fecal impaction on exam. FINDINGS: LOWER CHEST: No acute abnormality. LIVER: The liver is unremarkable. GALLBLADDER AND BILE DUCTS: Gallbladder is unremarkable. No biliary ductal dilatation. SPLEEN: No acute abnormality. PANCREAS: No acute abnormality. ADRENAL GLANDS: No acute abnormality. KIDNEYS, URETERS AND BLADDER: No stones in the kidneys or ureters. No hydronephrosis.  No perinephric or periureteral stranding. Urinary bladder is unremarkable. GI AND BOWEL: Status post right colectomy. Large amount of stool is seen in the sigmoid colon and rectum concerning for impaction. Stomach demonstrates no acute abnormality. There is no bowel obstruction. PERITONEUM AND RETROPERITONEUM: No ascites. No free air. VASCULATURE: Aorta is normal in caliber. LYMPH NODES: No lymphadenopathy. REPRODUCTIVE ORGANS: Status post hysterectomy. BONES AND SOFT TISSUES: No acute osseous abnormality. No focal soft tissue abnormality. IMPRESSION: 1. Large amount of stool in the sigmoid colon and rectum, concerning for fecal impaction. Electronically signed by: Lynwood Seip MD 07/29/2024 05:19 PM EST RP Workstation: [id number]      Subjective: Patient seen and examined at bedside.  No fever, agitation or vomiting reported.  Discharge Exam: Vitals:   08/03/24 2029 08/04/24 0441  BP: (!) 165/66 (!) 164/66  Pulse: 66 62  Resp:  16  Temp:  (!) 97.2 F (36.2 C)  SpO2:  99%    General: Pt is alert, awake, not in acute distress.  Elderly female sitting on chair.  Awake, answers some questions, slightly confused, slow to respond.  On room air Cardiovascular: rate controlled, S1/S2 + Respiratory: bilateral decreased breath sounds at bases Abdominal: Soft, NT, ND, bowel sounds + Extremities: Trace lower extremity edema; no cyanosis    The results of significant diagnostics from this hospitalization (including imaging, microbiology, ancillary and laboratory) are listed below for reference.     Microbiology: Recent Results (from the past 240 hours)  Urine Culture     Status: None   Collection Time: 07/29/24  2:17 PM   Specimen: Urine, Catheterized  Result Value Ref Range Status   Specimen Description   Final    URINE, CATHETERIZED Performed at [facility], [address], [place], [place] [address]    Special Requests   Final    NONE Performed at [facility], [address], [place], [place] [address]    Culture   Final    NO GROWTH Performed at [facility], [address], [place], [place] [address]    Report Status 07/30/2024 FINAL  Final  Blood culture (routine x 2)     Status: None   Collection Time: 07/29/24  9:45 PM   Specimen: BLOOD  Result Value Ref Range Status   Specimen Description   Final    BLOOD BLOOD RIGHT FOREARM Performed at [facility], [address] [place], [place] [address]    Special Requests   Final    Blood Culture results may not be optimal due to an inadequate volume of blood received in culture bottles BOTTLES DRAWN AEROBIC ONLY Performed at [facility] [address], [place], [place] [address]    Culture   Final    NO GROWTH 5 DAYS Performed at [facility], [address] [place], [place] [address]    Report Status 08/04/2024 FINAL  Final  Blood culture (routine x 2)     Status: None   Collection Time:  07/30/24  6:07 AM   Specimen: BLOOD  Result Value Ref Range Status   Specimen Description   Final    BLOOD BLOOD RIGHT HAND Performed at [facility], [address] [place], [place] [address]    Special Requests   Final    BOTTLES DRAWN AEROBIC ONLY Blood Culture results may not be optimal due to an inadequate volume of blood received in culture bottles Performed at [facility], [address] [place], [place] [address]    Culture   Final    NO GROWTH 5 DAYS Performed at [facility], [address] [place], [place] [address]    Report Status 08/04/2024 FINAL  Final     Labs: BNP (last 3 results) No results for input(s): BNP in the last 8760 hours. Basic Metabolic Panel: Recent Labs  Lab 07/28/24 1125 07/29/24 1406 07/29/24 2019 07/30/24 0607 08/01/24 0516 08/02/24 0915  NA 138 133* 131* 134* 138 136  K 4.6 4.5 3.9 3.7 3.1* 3.7  CL 101 98 99 98 102 101  CO2 25  21*  --  22 23 23   GLUCOSE 110* 112* 104* 88 114* 116*  BUN 14 16 12 11  6* <5*  CREATININE 0.81 1.04* 0.90 0.85 0.55 0.52  CALCIUM 9.7 9.9  --  9.3 8.9 9.3  MG  --   --   --   --  1.6* 1.6*   Liver Function Tests: Recent Labs  Lab 07/28/24 1125 07/29/24 1406  AST 16 27  ALT 10 13  ALKPHOS  --  73  BILITOT 0.7 0.7  PROT 6.3 7.4  ALBUMIN  --  4.2   Recent Labs  Lab 07/29/24 1406  LIPASE 26   No results for input(s): AMMONIA in the last 168 hours. CBC: Recent Labs  Lab 07/28/24 1125 07/29/24 1406 07/29/24 2019 07/30/24 0607 08/01/24 0516 08/02/24 0915  WBC 8.8 13.2*  --  12.1* 6.4 4.9  NEUTROABS 6,908 11.0*  --   --  4.1 2.9  HGB 10.0* 10.7* 11.9* 10.4* 9.8* 10.2*  HCT 32.8* 34.2* 35.0* 32.2* 31.2* 33.0*  MCV 78.7* 78.3*  --  76.8* 78.0* 78.2*  PLT 350 337  --  231 260 278   Cardiac Enzymes: No results for input(s): CKTOTAL, CKMB, CKMBINDEX, TROPONINI in the last 168 hours. BNP: Invalid input(s): POCBNP CBG: No results for input(s): GLUCAP in the last 168 hours. D-Dimer No results for input(s): DDIMER in the last 72 hours. Hgb A1c No results for input(s): HGBA1C in the last 72 hours. Lipid Profile No results for input(s): CHOL, HDL, LDLCALC, TRIG, CHOLHDL, LDLDIRECT in the last 72 hours. Thyroid  function studies No results for input(s): TSH, T4TOTAL, T3FREE, THYROIDAB in the last 72 hours.  Invalid input(s): FREET3 Anemia work up No results for input(s): VITAMINB12, FOLATE, FERRITIN, TIBC, IRON, RETICCTPCT in the last 72 hours. Urinalysis    Component Value Date/Time   COLORURINE YELLOW 07/29/2024 1417   APPEARANCEUR CLEAR 07/29/2024 1417   LABSPEC 1.015 07/29/2024 1417   PHURINE 5.0 07/29/2024 1417   GLUCOSEU NEGATIVE 07/29/2024 1417   HGBUR NEGATIVE 07/29/2024 1417   BILIRUBINUR NEGATIVE 07/29/2024 1417   BILIRUBINUR Negative 08/22/2023 1134   KETONESUR 5 (A) 07/29/2024 1417   PROTEINUR NEGATIVE  07/29/2024 1417   UROBILINOGEN 0.2 08/22/2023 1134   NITRITE NEGATIVE 07/29/2024 1417   LEUKOCYTESUR NEGATIVE 07/29/2024 1417   Sepsis Labs Recent Labs  Lab 07/29/24 1406 07/30/24 0607 08/01/24 0516 08/02/24 0915  WBC  13.2* 12.1* 6.4 4.9   Microbiology Recent Results (from the past 240 hours)  Urine Culture     Status: None   Collection Time: 07/29/24  2:17 PM   Specimen: Urine, Catheterized  Result Value Ref Range Status   Specimen Description   Final    URINE, CATHETERIZED Performed at [facility], [address] [place], [place] [address]    Special Requests   Final    NONE Performed at [facility] [address] [place], [place] [address]    Culture   Final    NO GROWTH Performed at [facility], [address] [place], [place] [address]    Report Status 07/30/2024 FINAL  Final  Blood culture (routine x 2)     Status: None   Collection Time: 07/29/24  9:45 PM   Specimen: BLOOD  Result Value Ref Range Status   Specimen Description   Final    BLOOD BLOOD RIGHT FOREARM Performed at [facility], [address] [place], [place] [address]    Special Requests   Final    Blood Culture results may not be optimal due to an inadequate volume of blood received in culture bottles BOTTLES DRAWN AEROBIC ONLY Performed at [facility], [address] [place], [place] [address]    Culture   Final    NO GROWTH 5 DAYS Performed at [facility], [address] [place], [place] [address]    Report Status 08/04/2024 FINAL  Final  Blood culture (routine x 2)     Status: None   Collection Time: 07/30/24  6:07 AM   Specimen: BLOOD  Result Value Ref Range Status   Specimen Description   Final    BLOOD BLOOD RIGHT HAND Performed at [facility], [address] [place], [place] [address]    Special Requests   Final    BOTTLES DRAWN AEROBIC ONLY Blood Culture results  may not be optimal due to an inadequate volume of blood received in culture bottles Performed at [facility], [address] [place], [place] [address]    Culture   Final    NO GROWTH 5 DAYS Performed at [facility], [address] [place], [place] [address]    Report Status 08/04/2024 FINAL  Final     Time coordinating discharge: 35 minutes  SIGNED:   Sophie Mao, MD  [facility] 08/04/2024, 10:00 AM

## 2024-08-04 NOTE — Plan of Care (Signed)
  Problem: Clinical Measurements: Goal: Will remain free from infection Outcome: Progressing   Problem: Safety: Goal: Ability to remain free from injury will improve Outcome: Progressing   

## 2024-08-05 ENCOUNTER — Telehealth: Payer: Self-pay

## 2024-08-05 DIAGNOSIS — Z483 Aftercare following surgery for neoplasm: Secondary | ICD-10-CM | POA: Diagnosis not present

## 2024-08-05 DIAGNOSIS — E785 Hyperlipidemia, unspecified: Secondary | ICD-10-CM

## 2024-08-05 DIAGNOSIS — R3915 Urgency of urination: Secondary | ICD-10-CM

## 2024-08-05 DIAGNOSIS — G309 Alzheimer's disease, unspecified: Secondary | ICD-10-CM

## 2024-08-05 DIAGNOSIS — E871 Hypo-osmolality and hyponatremia: Secondary | ICD-10-CM

## 2024-08-05 DIAGNOSIS — I10 Essential (primary) hypertension: Secondary | ICD-10-CM

## 2024-08-05 DIAGNOSIS — D630 Anemia in neoplastic disease: Secondary | ICD-10-CM

## 2024-08-05 DIAGNOSIS — C189 Malignant neoplasm of colon, unspecified: Secondary | ICD-10-CM | POA: Diagnosis not present

## 2024-08-05 DIAGNOSIS — F0280 Dementia in other diseases classified elsewhere without behavioral disturbance: Secondary | ICD-10-CM

## 2024-08-05 DIAGNOSIS — M24412 Recurrent dislocation, left shoulder: Secondary | ICD-10-CM | POA: Diagnosis not present

## 2024-08-05 DIAGNOSIS — N390 Urinary tract infection, site not specified: Secondary | ICD-10-CM

## 2024-08-05 DIAGNOSIS — M19012 Primary osteoarthritis, left shoulder: Secondary | ICD-10-CM | POA: Diagnosis not present

## 2024-08-05 NOTE — Transitions of Care (Post Inpatient/ED Visit) (Signed)
   08/05/2024  Name: Amy Andrade MRN: [id number] DOB: 01-06-1936  Today's TOC FU Call Status: Today's TOC FU Call Status:: Successful TOC FU Call Completed (Spoke with son, [name] who confirmed hospice with [facility]) TOC FU Call Complete Date: 08/05/24  Patient's Name and Date of Birth confirmed. DOB, Name  Transition Care Management Follow-up Telephone Call Date of Discharge: 08/04/24 Discharge Facility: [facility]) Type of Discharge: Inpatient Admission Primary Inpatient Discharge Diagnosis:: Severe sepsis   RN made outreach to patient/family and confirmed that hospice services are in place.  Patient/family aware to contact hospice 24/7 for any questions or concerns. No further interventions at this time.    Shona Prow RN, CCM [place]  VBCI-Population Health RN Care Manager [phone]

## 2024-09-02 ENCOUNTER — Ambulatory Visit: Payer: Self-pay | Admitting: Family

## 2024-12-07 ENCOUNTER — Encounter: Payer: Self-pay | Admitting: Family
